# Patient Record
Sex: Male | Born: 1945 | Race: White | Hispanic: No | Smoking: Former smoker
Health system: Southern US, Community
[De-identification: ages and names within clinical notes are randomized; demographics above are authoritative.]

## PROBLEM LIST (undated history)

## (undated) DIAGNOSIS — R200 Anesthesia of skin: Secondary | ICD-10-CM

## (undated) DIAGNOSIS — I340 Nonrheumatic mitral (valve) insufficiency: Secondary | ICD-10-CM

## (undated) DIAGNOSIS — I2510 Atherosclerotic heart disease of native coronary artery without angina pectoris: Secondary | ICD-10-CM

## (undated) DIAGNOSIS — Z9289 Personal history of other medical treatment: Secondary | ICD-10-CM

## (undated) DIAGNOSIS — E119 Type 2 diabetes mellitus without complications: Secondary | ICD-10-CM

## (undated) DIAGNOSIS — Z973 Presence of spectacles and contact lenses: Secondary | ICD-10-CM

## (undated) DIAGNOSIS — R531 Weakness: Secondary | ICD-10-CM

## (undated) DIAGNOSIS — I5022 Chronic systolic (congestive) heart failure: Secondary | ICD-10-CM

## (undated) DIAGNOSIS — I739 Peripheral vascular disease, unspecified: Secondary | ICD-10-CM

## (undated) DIAGNOSIS — I639 Cerebral infarction, unspecified: Secondary | ICD-10-CM

## (undated) DIAGNOSIS — I255 Ischemic cardiomyopathy: Secondary | ICD-10-CM

## (undated) DIAGNOSIS — D649 Anemia, unspecified: Secondary | ICD-10-CM

## (undated) DIAGNOSIS — R0600 Dyspnea, unspecified: Secondary | ICD-10-CM

## (undated) DIAGNOSIS — E785 Hyperlipidemia, unspecified: Secondary | ICD-10-CM

## (undated) DIAGNOSIS — M1990 Unspecified osteoarthritis, unspecified site: Secondary | ICD-10-CM

## (undated) DIAGNOSIS — C642 Malignant neoplasm of left kidney, except renal pelvis: Secondary | ICD-10-CM

## (undated) DIAGNOSIS — G459 Transient cerebral ischemic attack, unspecified: Secondary | ICD-10-CM

## (undated) DIAGNOSIS — R911 Solitary pulmonary nodule: Secondary | ICD-10-CM

## (undated) DIAGNOSIS — R197 Diarrhea, unspecified: Secondary | ICD-10-CM

## (undated) DIAGNOSIS — I779 Disorder of arteries and arterioles, unspecified: Secondary | ICD-10-CM

## (undated) HISTORY — DX: Cerebral infarction, unspecified: I63.9

## (undated) HISTORY — PX: RENAL BIOPSY: SHX156

## (undated) HISTORY — DX: Hyperlipidemia, unspecified: E78.5

## (undated) HISTORY — DX: Atherosclerotic heart disease of native coronary artery without angina pectoris: I25.10

## (undated) HISTORY — PX: CARDIOVASCULAR STRESS TEST: SHX262

## (undated) HISTORY — DX: Type 2 diabetes mellitus without complications: E11.9

## (undated) HISTORY — DX: Ischemic cardiomyopathy: I25.5

---

## 2012-07-04 ENCOUNTER — Inpatient Hospital Stay (HOSPITAL_COMMUNITY)
Admission: EM | Admit: 2012-07-04 | Discharge: 2012-07-11 | DRG: 234 | Disposition: A | Discharge status: Home / Self Care | Payer: Medicare Other | Attending: Cardiothoracic Surgery | Admitting: Cardiothoracic Surgery

## 2012-07-04 ENCOUNTER — Inpatient Hospital Stay (HOSPITAL_COMMUNITY): Payer: Medicare Other

## 2012-07-04 ENCOUNTER — Encounter (HOSPITAL_COMMUNITY)
Admission: EM | Disposition: A | Discharge status: Home / Self Care | Payer: Self-pay | Source: Home / Self Care | Attending: Cardiothoracic Surgery

## 2012-07-04 ENCOUNTER — Other Ambulatory Visit: Payer: Self-pay | Admitting: *Deleted

## 2012-07-04 ENCOUNTER — Ambulatory Visit (HOSPITAL_COMMUNITY): Admit: 2012-07-04 | Payer: Self-pay | Admitting: Cardiovascular Disease

## 2012-07-04 ENCOUNTER — Encounter (HOSPITAL_COMMUNITY): Payer: Self-pay | Admitting: Physician Assistant

## 2012-07-04 DIAGNOSIS — I2510 Atherosclerotic heart disease of native coronary artery without angina pectoris: Secondary | ICD-10-CM

## 2012-07-04 DIAGNOSIS — E8779 Other fluid overload: Secondary | ICD-10-CM | POA: Diagnosis not present

## 2012-07-04 DIAGNOSIS — Z7982 Long term (current) use of aspirin: Secondary | ICD-10-CM

## 2012-07-04 DIAGNOSIS — E119 Type 2 diabetes mellitus without complications: Secondary | ICD-10-CM | POA: Diagnosis present

## 2012-07-04 DIAGNOSIS — I213 ST elevation (STEMI) myocardial infarction of unspecified site: Secondary | ICD-10-CM

## 2012-07-04 DIAGNOSIS — Y9210 Unspecified residential institution as the place of occurrence of the external cause: Secondary | ICD-10-CM | POA: Diagnosis present

## 2012-07-04 DIAGNOSIS — J9819 Other pulmonary collapse: Secondary | ICD-10-CM | POA: Diagnosis not present

## 2012-07-04 DIAGNOSIS — R739 Hyperglycemia, unspecified: Secondary | ICD-10-CM | POA: Diagnosis present

## 2012-07-04 DIAGNOSIS — E876 Hypokalemia: Secondary | ICD-10-CM | POA: Diagnosis not present

## 2012-07-04 DIAGNOSIS — I2109 ST elevation (STEMI) myocardial infarction involving other coronary artery of anterior wall: Principal | ICD-10-CM | POA: Diagnosis present

## 2012-07-04 DIAGNOSIS — D62 Acute posthemorrhagic anemia: Secondary | ICD-10-CM | POA: Diagnosis not present

## 2012-07-04 DIAGNOSIS — R7309 Other abnormal glucose: Secondary | ICD-10-CM

## 2012-07-04 DIAGNOSIS — Z8249 Family history of ischemic heart disease and other diseases of the circulatory system: Secondary | ICD-10-CM

## 2012-07-04 DIAGNOSIS — Z01810 Encounter for preprocedural cardiovascular examination: Secondary | ICD-10-CM

## 2012-07-04 DIAGNOSIS — I2584 Coronary atherosclerosis due to calcified coronary lesion: Secondary | ICD-10-CM | POA: Diagnosis present

## 2012-07-04 DIAGNOSIS — Z87891 Personal history of nicotine dependence: Secondary | ICD-10-CM

## 2012-07-04 DIAGNOSIS — I4891 Unspecified atrial fibrillation: Secondary | ICD-10-CM | POA: Diagnosis not present

## 2012-07-04 DIAGNOSIS — Z951 Presence of aortocoronary bypass graft: Secondary | ICD-10-CM

## 2012-07-04 DIAGNOSIS — J988 Other specified respiratory disorders: Secondary | ICD-10-CM | POA: Diagnosis not present

## 2012-07-04 DIAGNOSIS — I519 Heart disease, unspecified: Secondary | ICD-10-CM | POA: Diagnosis not present

## 2012-07-04 DIAGNOSIS — Z79899 Other long term (current) drug therapy: Secondary | ICD-10-CM

## 2012-07-04 DIAGNOSIS — Y832 Surgical operation with anastomosis, bypass or graft as the cause of abnormal reaction of the patient, or of later complication, without mention of misadventure at the time of the procedure: Secondary | ICD-10-CM | POA: Diagnosis present

## 2012-07-04 HISTORY — PX: LEFT HEART CATHETERIZATION WITH CORONARY ANGIOGRAM: SHX5451

## 2012-07-04 LAB — CBC
HCT: 48.5 % (ref 39.0–52.0)
HCT: 38.7 % — ABNORMAL LOW (ref 39.0–52.0)
Hemoglobin: 13.9 g/dL (ref 13.0–17.0)
MCH: 29.8 pg (ref 26.0–34.0)
MCHC: 36.5 g/dL — ABNORMAL HIGH (ref 30.0–36.0)
MCHC: 35.9 g/dL (ref 30.0–36.0)
MCV: 85.1 fL (ref 78.0–100.0)
RBC: 4.66 MIL/uL (ref 4.22–5.81)
RDW: 12.9 % (ref 11.5–15.5)

## 2012-07-04 LAB — GLUCOSE, CAPILLARY
Glucose-Capillary: 482 mg/dL — ABNORMAL HIGH (ref 70–99)
Glucose-Capillary: 319 mg/dL — ABNORMAL HIGH (ref 70–99)

## 2012-07-04 LAB — BASIC METABOLIC PANEL
BUN: 13 mg/dL (ref 6–23)
Creatinine, Ser: 0.77 mg/dL (ref 0.50–1.35)
GFR calc Af Amer: >90 mL/min (ref >90)
GFR calc non Af Amer: >90 mL/min (ref >90)
Potassium: 4.5 mEq/L (ref 3.5–5.1)

## 2012-07-04 LAB — BLOOD GAS, ARTERIAL
Acid-base deficit: 0.3 mmol/L (ref 0.0–2.0)
Bicarbonate: 23.7 mEq/L (ref 20.0–24.0)
Drawn by: 325261
O2 Content: 2 L/min
O2 Saturation: 97.6 %
Patient temperature: 98.6
TCO2: 24.8 mmol/L (ref 0–100)
pCO2 arterial: 37.7 mmHg (ref 35.0–45.0)
pH, Arterial: 7.415 (ref 7.350–7.450)
pO2, Arterial: 82.4 mmHg (ref 80.0–100.0)

## 2012-07-04 LAB — URINALYSIS, ROUTINE W REFLEX MICROSCOPIC
Bilirubin Urine: NEGATIVE
Glucose, UA: >1000 mg/dL — AB
Hgb urine dipstick: NEGATIVE
Ketones, ur: 15 mg/dL — AB
Leukocytes, UA: NEGATIVE
Nitrite: NEGATIVE
Protein, ur: NEGATIVE mg/dL
Specific Gravity, Urine: 1.01 (ref 1.005–1.030)
Urobilinogen, UA: 0.2 mg/dL (ref 0.0–1.0)
pH: 5.5 (ref 5.0–8.0)

## 2012-07-04 LAB — TSH: TSH: 0.734 u[IU]/mL (ref 0.350–4.500)

## 2012-07-04 LAB — CREATININE, SERUM
Creatinine, Ser: 0.71 mg/dL (ref 0.50–1.35)
GFR calc non Af Amer: >90 mL/min (ref >90)

## 2012-07-04 LAB — PREPARE RBC (CROSSMATCH)

## 2012-07-04 LAB — URINE MICROSCOPIC-ADD ON

## 2012-07-04 LAB — HEPARIN LEVEL (UNFRACTIONATED): Heparin Unfractionated: 0.12 IU/mL — ABNORMAL LOW (ref 0.30–0.70)

## 2012-07-04 LAB — ABO/RH: ABO/RH(D): O POS

## 2012-07-04 LAB — SURGICAL PCR SCREEN
MRSA, PCR: NEGATIVE
Staphylococcus aureus: POSITIVE — AB

## 2012-07-04 LAB — PROTIME-INR
INR: 1.25 (ref 0.00–1.49)
Prothrombin Time: 15.5 seconds — ABNORMAL HIGH (ref 11.6–15.2)

## 2012-07-04 LAB — LIPID PANEL: LDL Cholesterol: 102 mg/dL — ABNORMAL HIGH (ref 0–99)

## 2012-07-04 LAB — HEMOGLOBIN A1C
Hgb A1c MFr Bld: 14.3 % — ABNORMAL HIGH (ref <5.7)
Mean Plasma Glucose: 364 mg/dL — ABNORMAL HIGH (ref <117)

## 2012-07-04 SURGERY — LEFT HEART CATHETERIZATION WITH CORONARY ANGIOGRAM
Anesthesia: LOCAL

## 2012-07-04 MED ORDER — ATORVASTATIN CALCIUM 80 MG PO TABS
80.0000 mg | ORAL_TABLET | Freq: Every day | ORAL | Status: DC
  Administered 2012-07-04 – 2012-07-10 (×6): 80 mg via ORAL
  Filled 2012-07-04 (×8): qty 1

## 2012-07-04 MED ORDER — SODIUM CHLORIDE 0.9 % IV SOLN
INTRAVENOUS | Status: DC
  Filled 2012-07-04: qty 1

## 2012-07-04 MED ORDER — ALBUTEROL SULFATE (5 MG/ML) 0.5% IN NEBU
2.5000 mg | INHALATION_SOLUTION | Freq: Once | RESPIRATORY_TRACT | Status: AC
  Administered 2012-07-04: 2.5 mg via RESPIRATORY_TRACT

## 2012-07-04 MED ORDER — BISACODYL 5 MG PO TBEC
5.0000 mg | DELAYED_RELEASE_TABLET | Freq: Once | ORAL | Status: AC
  Administered 2012-07-04: 5 mg via ORAL
  Filled 2012-07-04: qty 1

## 2012-07-04 MED ORDER — INSULIN ASPART 100 UNIT/ML ~~LOC~~ SOLN: 0.0000 [IU] | Freq: Every day | SUBCUTANEOUS | Status: DC

## 2012-07-04 MED ORDER — HEPARIN (PORCINE) IN NACL 100-0.45 UNIT/ML-% IJ SOLN
1300.0000 [IU]/h | INTRAMUSCULAR | Status: DC
  Administered 2012-07-04: 1000 [IU]/h via INTRAVENOUS
  Filled 2012-07-04 (×2): qty 250

## 2012-07-04 MED ORDER — ONDANSETRON HCL 4 MG/2ML IJ SOLN: 4.0000 mg | Freq: Four times a day (QID) | INTRAMUSCULAR | Status: DC | PRN

## 2012-07-04 MED ORDER — POTASSIUM CHLORIDE 2 MEQ/ML IV SOLN
80.0000 meq | INTRAVENOUS | Status: DC
  Filled 2012-07-04: qty 40

## 2012-07-04 MED ORDER — SODIUM CHLORIDE 0.9 % IV SOLN: 250.0000 mL | INTRAVENOUS | Status: DC | PRN

## 2012-07-04 MED ORDER — DEXTROSE 5 % IV SOLN
1.5000 g | INTRAVENOUS | Status: DC
  Filled 2012-07-04: qty 1.5

## 2012-07-04 MED ORDER — CHLORHEXIDINE GLUCONATE 4 % EX LIQD
60.0000 mL | Freq: Once | CUTANEOUS | Status: AC
  Administered 2012-07-05: 4 via TOPICAL
  Filled 2012-07-04: qty 60

## 2012-07-04 MED ORDER — ASPIRIN EC 325 MG PO TBEC: 325.0000 mg | DELAYED_RELEASE_TABLET | Freq: Every day | ORAL | Status: DC

## 2012-07-04 MED ORDER — DEXTROSE 5 % IV SOLN
750.0000 mg | INTRAVENOUS | Status: DC
  Filled 2012-07-04: qty 750

## 2012-07-04 MED ORDER — MAGNESIUM SULFATE 50 % IJ SOLN
40.0000 meq | INTRAMUSCULAR | Status: DC
  Filled 2012-07-04: qty 10

## 2012-07-04 MED ORDER — INSULIN ASPART 100 UNIT/ML ~~LOC~~ SOLN: 0.0000 [IU] | Freq: Three times a day (TID) | SUBCUTANEOUS | Status: DC

## 2012-07-04 MED ORDER — EPINEPHRINE HCL 1 MG/ML IJ SOLN
0.5000 ug/min | INTRAMUSCULAR | Status: DC
  Filled 2012-07-04: qty 4

## 2012-07-04 MED ORDER — DIAZEPAM 5 MG PO TABS: 5.0000 mg | ORAL_TABLET | ORAL | Status: DC | PRN

## 2012-07-04 MED ORDER — PHENYLEPHRINE HCL 10 MG/ML IJ SOLN
30.0000 ug/min | INTRAVENOUS | Status: DC
  Filled 2012-07-04: qty 2

## 2012-07-04 MED ORDER — DEXMEDETOMIDINE HCL IN NACL 400 MCG/100ML IV SOLN
0.1000 ug/kg/h | INTRAVENOUS | Status: DC
  Filled 2012-07-04: qty 100

## 2012-07-04 MED ORDER — LIDOCAINE HCL (PF) 1 % IJ SOLN
INTRAMUSCULAR | Status: AC
  Filled 2012-07-04: qty 30

## 2012-07-04 MED ORDER — VANCOMYCIN HCL 10 G IV SOLR
1500.0000 mg | INTRAVENOUS | Status: AC
  Administered 2012-07-05: 1500 mg via INTRAVENOUS
  Filled 2012-07-04: qty 1500

## 2012-07-04 MED ORDER — NITROGLYCERIN IN D5W 200-5 MCG/ML-% IV SOLN
2.0000 ug/min | INTRAVENOUS | Status: DC
  Filled 2012-07-04: qty 250

## 2012-07-04 MED ORDER — INSULIN ASPART 100 UNIT/ML ~~LOC~~ SOLN
10.0000 [IU] | Freq: Once | SUBCUTANEOUS | Status: AC
  Administered 2012-07-04: 10 [IU] via SUBCUTANEOUS
  Filled 2012-07-04: qty 1

## 2012-07-04 MED ORDER — ACETAMINOPHEN 325 MG PO TABS: 650.0000 mg | ORAL_TABLET | Freq: Once | ORAL | Status: DC

## 2012-07-04 MED ORDER — VANCOMYCIN HCL 10 G IV SOLR
1500.0000 mg | INTRAVENOUS | Status: DC
  Filled 2012-07-04: qty 1500

## 2012-07-04 MED ORDER — HEPARIN SODIUM (PORCINE) 1000 UNIT/ML IJ SOLN
INTRAMUSCULAR | Status: AC
  Filled 2012-07-04: qty 1

## 2012-07-04 MED ORDER — SODIUM CHLORIDE 0.9 % IV SOLN: INTRAVENOUS | Status: DC

## 2012-07-04 MED ORDER — PHENYLEPHRINE HCL 10 MG/ML IJ SOLN
30.0000 ug/min | INTRAVENOUS | Status: AC
  Administered 2012-07-05: 20 ug/min via INTRAVENOUS
  Filled 2012-07-04: qty 2

## 2012-07-04 MED ORDER — SODIUM CHLORIDE 0.9 % IV SOLN
INTRAVENOUS | Status: AC
  Administered 2012-07-05: 69.8 mL/h via INTRAVENOUS
  Filled 2012-07-04: qty 40

## 2012-07-04 MED ORDER — SODIUM CHLORIDE 0.9 % IJ SOLN: 3.0000 mL | INTRAMUSCULAR | Status: DC | PRN

## 2012-07-04 MED ORDER — INSULIN ASPART 100 UNIT/ML ~~LOC~~ SOLN: 10.0000 [IU] | Freq: Once | SUBCUTANEOUS | Status: DC

## 2012-07-04 MED ORDER — ACETAMINOPHEN 325 MG PO TABS: 650.0000 mg | ORAL_TABLET | ORAL | Status: DC | PRN

## 2012-07-04 MED ORDER — DEXTROSE 5 % IV SOLN
1.5000 g | INTRAVENOUS | Status: AC
  Administered 2012-07-05: 1.5 g via INTRAVENOUS
  Administered 2012-07-05: .75 g via INTRAVENOUS
  Filled 2012-07-04: qty 1.5

## 2012-07-04 MED ORDER — METOPROLOL TARTRATE 1 MG/ML IV SOLN
INTRAVENOUS | Status: AC
  Filled 2012-07-04: qty 5

## 2012-07-04 MED ORDER — ALPRAZOLAM 0.25 MG PO TABS: 0.2500 mg | ORAL_TABLET | Freq: Two times a day (BID) | ORAL | Status: DC | PRN

## 2012-07-04 MED ORDER — HEPARIN (PORCINE) IN NACL 2-0.9 UNIT/ML-% IJ SOLN
INTRAMUSCULAR | Status: AC
  Filled 2012-07-04: qty 1000

## 2012-07-04 MED ORDER — ATORVASTATIN CALCIUM 80 MG PO TABS: 80.0000 mg | ORAL_TABLET | Freq: Every day | ORAL | Status: DC

## 2012-07-04 MED ORDER — SODIUM CHLORIDE 0.9 % IV SOLN
INTRAVENOUS | Status: DC
  Administered 2012-07-04: 2.7 [IU]/h via INTRAVENOUS
  Filled 2012-07-04 (×2): qty 1

## 2012-07-04 MED ORDER — BUDESONIDE-FORMOTEROL FUMARATE 160-4.5 MCG/ACT IN AERO
2.0000 | INHALATION_SPRAY | Freq: Two times a day (BID) | RESPIRATORY_TRACT | Status: DC
  Administered 2012-07-04: 2 via RESPIRATORY_TRACT
  Filled 2012-07-04: qty 6

## 2012-07-04 MED ORDER — ZOLPIDEM TARTRATE 5 MG PO TABS: 5.0000 mg | ORAL_TABLET | Freq: Every evening | ORAL | Status: DC | PRN

## 2012-07-04 MED ORDER — METOPROLOL TARTRATE 12.5 MG HALF TABLET
25.0000 mg | ORAL_TABLET | Freq: Two times a day (BID) | ORAL | Status: DC
  Administered 2012-07-04: 25 mg via ORAL

## 2012-07-04 MED ORDER — DOPAMINE-DEXTROSE 3.2-5 MG/ML-% IV SOLN
2.0000 ug/kg/min | INTRAVENOUS | Status: DC
  Filled 2012-07-04: qty 250

## 2012-07-04 MED ORDER — DOPAMINE-DEXTROSE 3.2-5 MG/ML-% IV SOLN
2.0000 ug/kg/min | INTRAVENOUS | Status: AC
  Administered 2012-07-05: 3 ug/kg/min via INTRAVENOUS

## 2012-07-04 MED ORDER — NITROGLYCERIN 0.4 MG SL SUBL: 0.4000 mg | SUBLINGUAL_TABLET | SUBLINGUAL | Status: DC | PRN

## 2012-07-04 MED ORDER — CEFUROXIME SODIUM 750 MG IJ SOLR
750.0000 mg | INTRAMUSCULAR | Status: DC
  Filled 2012-07-04: qty 750

## 2012-07-04 MED ORDER — DEXMEDETOMIDINE HCL IN NACL 400 MCG/100ML IV SOLN
0.1000 ug/kg/h | INTRAVENOUS | Status: AC
  Administered 2012-07-05: 0.2 ug/kg/h via INTRAVENOUS

## 2012-07-04 MED ORDER — SODIUM CHLORIDE 0.9 % IV SOLN
INTRAVENOUS | Status: DC
  Filled 2012-07-04: qty 40

## 2012-07-04 MED ORDER — CHLORHEXIDINE GLUCONATE 4 % EX LIQD
60.0000 mL | Freq: Once | CUTANEOUS | Status: AC
  Administered 2012-07-04: 4 via TOPICAL
  Filled 2012-07-04: qty 60

## 2012-07-04 MED ORDER — PLASMA-LYTE 148 IV SOLN
INTRAVENOUS | Status: AC
  Administered 2012-07-05: 10:00:00
  Filled 2012-07-04: qty 2.5

## 2012-07-04 MED ORDER — METOPROLOL TARTRATE 12.5 MG HALF TABLET
12.5000 mg | ORAL_TABLET | Freq: Once | ORAL | Status: AC
  Administered 2012-07-05: 12.5 mg via ORAL
  Filled 2012-07-04: qty 1

## 2012-07-04 MED ORDER — NITROGLYCERIN IN D5W 200-5 MCG/ML-% IV SOLN
2.0000 ug/min | INTRAVENOUS | Status: AC
  Administered 2012-07-05: 5 ug/min via INTRAVENOUS

## 2012-07-04 MED ORDER — ASPIRIN 81 MG PO CHEW
CHEWABLE_TABLET | ORAL | Status: AC
  Administered 2012-07-04: 324 mg
  Filled 2012-07-04: qty 4

## 2012-07-04 MED ORDER — CARVEDILOL 6.25 MG PO TABS: 6.2500 mg | ORAL_TABLET | Freq: Two times a day (BID) | ORAL | Status: DC

## 2012-07-04 MED ORDER — EPINEPHRINE HCL 1 MG/ML IJ SOLN
0.5000 ug/min | INTRAVENOUS | Status: DC
  Filled 2012-07-04: qty 4

## 2012-07-04 MED ORDER — SODIUM CHLORIDE 0.9 % IJ SOLN: 3.0000 mL | Freq: Two times a day (BID) | INTRAMUSCULAR | Status: DC

## 2012-07-04 MED ORDER — PLASMA-LYTE 148 IV SOLN
INTRAVENOUS | Status: DC
  Filled 2012-07-04: qty 2.5

## 2012-07-04 NOTE — Progress Notes (Signed)
ANTICOAGULATION CONSULT NOTE - Follow Up Consult  Pharmacy Consult for Heparin Indication: 3VCAD  No Known Allergies  Patient Measurements: Height: 6' (182.9 cm) Weight: 195 lb 1.7 oz (88.5 kg) IBW/kg (Calculated) : 77.6 Heparin Dosing Weight:    Vital Signs: Temp: 97.9 F (36.6 C) (04/17 1900) Temp src: Oral (04/17 1900) BP: 110/67 mmHg (04/17 2000) Pulse Rate: 78 (04/17 2100)  Labs:  Recent Labs  07/04/12 1215 07/04/12 1310 07/04/12 1607 07/04/12 2058  HGB 17.7*  --  13.9  --   HCT 48.5  --  38.7*  --   PLT 248  --  231  --   APTT  --  143*  --   --   LABPROT  --  15.5*  --   --   INR  --  1.25  --   --   HEPARINUNFRC  --   --   --  0.12*  CREATININE 0.77  --  0.71  --   TROPONINI  --   --  10.28*  --     Estimated Creatinine Clearance: 101 ml/min (by C-G formula based on Cr of 0.71).   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 Plan CABG in am. Started on IV heparin post-cath with heparin level < goal at 0.12.  Goal of Therapy:  Heparin level 0.3-0.7 units/ml Monitor platelets by anticoagulation protocol: Yes   Plan:  Increase IV heparin (no bolus due to fresh post-cath) to 1300 units/hr. Will check heparin level again in 6-8 hrs with am labs and f/u after CABG.  Misty Stanley Stillinger 07/04/2012,9:35 PM

## 2012-07-04 NOTE — ED Notes (Signed)
Report given to cath lab.   pts brother had heart attack in last year. Niece is type I diabetic. Smoker 30 years 1 pack per day. Quit smoking 6 months ago.

## 2012-07-04 NOTE — Progress Notes (Signed)
ANTICOAGULATION CONSULT NOTE - Initial Consult  Pharmacy Consult for heparin Indication: chest pain/ACS  No Known Allergies  Patient Measurements: Height: 6' (182.9 cm) Weight: 195 lb 1.7 oz (88.5 kg) IBW/kg (Calculated) : 77.6 Heparin Dosing Weight: 85kg  Vital Signs: Temp: 98.5 F (36.9 C) (04/17 1203) Temp src: Oral (04/17 1203) BP: 117/89 mmHg (04/17 1224) Pulse Rate: 111 (04/17 1224)  Labs:  Recent Labs  07/04/12 1215  HGB 17.7*  HCT 48.5  PLT 248  CREATININE 0.77    Estimated Creatinine Clearance: 101 ml/min (by C-G formula based on Cr of 0.77).   Medical History: None  Medications:  No meds prior to admission  Assessment: 67 year old male with slight syncopal episode earlier this morning then drove himself to [facility]. He has been having CP for quite some time and troponin was elevated at 6, CBG elevated >500, and he was cp free at presentation to cath lab.   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 He was given 324mg  of aspirin and 10 units of insulin at [facility] and 4k units of heparin in cath lab.  CVTS consult recommends glucose control overnight and CABG in am. Orders to start IV heparin at 1530.  Goal of Therapy:  Heparin level 0.3-0.7 units/ml Monitor platelets by anticoagulation protocol: Yes   Plan:  Start heparin infusion at 1000 units/hr Check anti-Xa level in 6 hours and daily while on heparin Continue to monitor H&H and platelets  Sheppard Coil PharmD., BCPS Clinical Pharmacist Pager [phone] 07/04/2012 1:46 PM

## 2012-07-04 NOTE — ED Notes (Signed)
Pt being transported by [facility]

## 2012-07-04 NOTE — Progress Notes (Signed)
BA PFT completed at bedside. Pt bed @ 50 degrees. Unconfirmed results to be placed in Shadow Chart

## 2012-07-04 NOTE — ED Provider Notes (Signed)
History     CSN: [id number]  Arrival date & time 07/04/12  1154   First MD Initiated Contact with Patient 07/04/12 1205      Chief Complaint  Patient presents with  . Near Syncope    (Consider location/radiation/quality/duration/timing/severity/associated sxs/prior treatment) The history is provided by the patient and the spouse.  pt here with left arm numbness and chest pain x 2 days--pt notes associated dyspnea and diaphoresis--pt worse with exertion--also notes near syncope--sx better with rest--no tx used, no priror h/o same--denies any back pain  No past medical history on file.  No past surgical history on file.  No family history on file.  History  Substance Use Topics  . Smoking status: Not on file  . Smokeless tobacco: Not on file  . Alcohol Use: Not on file      Review of Systems  All other systems reviewed and are negative.    Allergies  Review of patient's allergies indicates not on file.  Home Medications  No current outpatient prescriptions on file.  BP 137/102  Pulse 120  Temp(Src) 98.5 F (36.9 C) (Oral)  Resp 18  SpO2 99%  Physical Exam  Nursing note and vitals reviewed. Constitutional: He is oriented to person, place, and time. He appears well-developed and well-nourished.  Non-toxic appearance. No distress.  HENT:  Head: Normocephalic and atraumatic.  Eyes: Conjunctivae, EOM and lids are normal. Pupils are equal, round, and reactive to light.  Neck: Normal range of motion. Neck supple. No tracheal deviation present. No mass present.  Cardiovascular: Regular rhythm and normal heart sounds.  Tachycardia present.  Exam reveals no gallop.   No murmur heard. Pulmonary/Chest: Effort normal and breath sounds normal. No stridor. No respiratory distress. He has no decreased breath sounds. He has no wheezes. He has no rhonchi. He has no rales.  Abdominal: Soft. Normal appearance and bowel sounds are normal. He exhibits no distension. There is no  tenderness. There is no rebound and no CVA tenderness.  Musculoskeletal: Normal range of motion. He exhibits no edema and no tenderness.  Neurological: He is alert and oriented to person, place, and time. He has normal strength. No cranial nerve deficit or sensory deficit. GCS eye subscore is 4. GCS verbal subscore is 5. GCS motor subscore is 6.  Skin: Skin is warm and dry. No abrasion and no rash noted.  Psychiatric: He has a normal mood and affect. His speech is normal and behavior is normal.    ED Course  Procedures (including critical care time)  Labs Reviewed  GLUCOSE, CAPILLARY - Abnormal; Notable for the following:    Glucose-Capillary 544 (*)    All other components within normal limits  CBC  BASIC METABOLIC PANEL  PRO B NATRIURETIC PEPTIDE   No results found.   No diagnosis found.    MDM   Date: 07/04/2012  Rate: 120   Rhythm: sinus tachycardia  QRS Axis: normal  Intervals: normal  ST/T Wave abnormalities: ST elevations anteriorly  Conduction Disutrbances:none  Narrative Interpretation: acute ant wall MI  Old EKG Reviewed:none  CODE stemi activated, pt given aspirin, hyperglycemia noted, will given insulin, pt to be transferred to [facility] to cath lab          Toy Baker, MD 07/04/12 1223

## 2012-07-04 NOTE — ED Notes (Signed)
Critical lab - I stat Troponin level 6.07.  [place] reported lab to Dr Freida Busman [phone].
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
## 2012-07-04 NOTE — ED Notes (Signed)
md at bedside
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
## 2012-07-04 NOTE — Consult Note (Signed)
patient examined and medical record reviewed,agree with above note. Trevor [name],Trevor [name] 07/04/2012    Pt examined and cath reviewed and discussed with Dr Kirke Corin- plan multi vessel CABG in AM.

## 2012-07-04 NOTE — Progress Notes (Signed)
VASCULAR LAB PRELIMINARY  PRELIMINARY  PRELIMINARY  PRELIMINARY  Pre-op Cardiac Surgery  Carotid Findings:  Bilateral:  No evidence of hemodynamically significant internal carotid artery stenosis.   Vertebral artery flow is antegrade.      Upper Extremity Right Left  Brachial Pressures 116 T 109 T  Radial Waveforms T T  Ulnar Waveforms T T  Palmar Arch (Allen's Test) * **   Findings:  *Right:  Doppler waveforms obliterate with radial and remain normal with ulnar compressions.  **Left:  Doppler waveforms remain normal with radial and ulnar compressions.    Lower  Extremity Right Left  Dorsalis Pedis    Anterior Tibial 95 M 137 T  Posterior Tibial 91 M 102 T  Ankle/Brachial Indices 0.82 >1.0    Findings:  Right ABI indicates a mild reduction in arterial flow.  Left ABI is within normal limits.   Piers Baade, RVT 07/04/2012, 4:48 PM

## 2012-07-04 NOTE — ED Notes (Signed)
[facility] at bedside 

## 2012-07-04 NOTE — Progress Notes (Signed)
CRITICAL VALUE ALERT  Critical value received:  Troponin 10.28  Date of notification:  07/04/12  Time of notification:  1705  Critical value read back:yes  Nurse who received alert:  Larina Bras  MD notified (1st page):  Will address with rounds  Time of first page:  n/a  MD notified (2nd page):  Time of second page:  Responding MD:  n/a  Time MD responded:  n/a
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
## 2012-07-04 NOTE — CV Procedure (Signed)
    Cardiac Catheterization Procedure Note  Name: Trevor [name] MRN: [id number] DOB: 1945/08/11  Procedure: Left Heart Cath, Selective Coronary Angiography, LV angiography, Mynx closure device   Indication: Anterior ST elevation myocardial infarction with late presentation.   Medications:  Sedation:   Contrast:  90 mL Omnipaque  Procedural details: The right groin was prepped, draped, and anesthetized with 1% lidocaine. Using modified Seldinger technique, a 6  French sheath was introduced into the right femoral artery. Standard Judkins catheters were used for coronary angiography and left ventriculography. An XB 3.5 guiding catheter was used to engage the left main coronary artery. Catheter exchanges were performed over a guidewire. There were no immediate procedural complications. The patient was transferred to the post catheterization recovery area for further monitoring.   Procedural Findings:  Hemodynamics: AO:  110/66   mmHg LV:  111/1    mmHg LVEDP: 8  mmHg  Coronary angiography: Coronary dominance: Right   Left Main:  Normal in size and mildly calcified. There is a 50% distal stenosis.  Left Anterior Descending (LAD):  Normal in size and mildly calcified throughout its course. There is diffuse 20% disease proximally. In the midsegment, there is a 99% tubular stenosis which is likely the culprit for anterior ST elevation myocardial infarction. There is TIMI 2 flow in the vessel.  Circumflex (LCx):  Normal in size and nondominant. There is an 80-90% ostial stenosis. In the midsegment, there is an 80% stenosis.  1st obtuse marginal:  Normal in size with 82-90% ostial stenosis.  2nd obtuse marginal:  Small in size with minor irregularities.  3rd obtuse marginal:  Small in size with minor irregularities.   Ramus Intermedius:  Normal in size with 50% ostial disease and 70% mid stenosis.  Right Coronary Artery: Very large in size and dominant. The vessel is moderately calcified  throughout its course. There is a 90% proximal stenosis. The mid vessel has diffuse 20% disease. There is a 40% distal stenosis.  posterior descending artery: Normal in size with diffuse 90% disease proximally.  posterior lateral branch:  PL 1 is small in size with no significant disease. PL 2 is large in size with 90% proximal disease.  Left ventriculography: Left ventricular systolic function is moderately reduced , LVEF is estimated at 35-40 %, there is no significant mitral regurgitation . There is anterior and apical wall hypokinesis.  Final Conclusions:   1. Severe three-vessel coronary artery disease and borderline significant left main stenosis. The LAD is likely the culprit for ST elevation MI. However, the vessel has reasonable flow. 2. Moderately reduced LV systolic function with normal LVEDP.  Recommendations:  Given the patient's diffuse three-vessel coronary artery disease, LV dysfunction and the fact that he is likely diabetic, I think the best option is CABG. I consulted Dr. Donata Clay who agreed. The plan is to stabilize the patient overnight and bring his blood sugar down with insulin drip. He was given 2.5 mg of IV metoprolol with significant improvement in his heart rate and blood pressure. His vital signs were stable and he was chest pain-free.  Lorine Bears MD, [facility] 07/04/2012, 1:38 PM

## 2012-07-05 ENCOUNTER — Inpatient Hospital Stay (HOSPITAL_COMMUNITY): Payer: Medicare Other | Admitting: Anesthesiology

## 2012-07-05 ENCOUNTER — Encounter (HOSPITAL_COMMUNITY)
Admission: EM | Disposition: A | Discharge status: Home / Self Care | Payer: Self-pay | Source: Home / Self Care | Attending: Cardiothoracic Surgery

## 2012-07-05 ENCOUNTER — Inpatient Hospital Stay (HOSPITAL_COMMUNITY): Payer: Medicare Other

## 2012-07-05 ENCOUNTER — Encounter (HOSPITAL_COMMUNITY): Payer: Self-pay | Admitting: Anesthesiology

## 2012-07-05 DIAGNOSIS — I2510 Atherosclerotic heart disease of native coronary artery without angina pectoris: Secondary | ICD-10-CM

## 2012-07-05 DIAGNOSIS — I219 Acute myocardial infarction, unspecified: Secondary | ICD-10-CM

## 2012-07-05 HISTORY — PX: INTRAOPERATIVE TRANSESOPHAGEAL ECHOCARDIOGRAM: SHX5062

## 2012-07-05 HISTORY — PX: CORONARY ARTERY BYPASS GRAFT: SHX141

## 2012-07-05 LAB — CBC
HCT: 36.9 % — ABNORMAL LOW (ref 39.0–52.0)
HCT: 33 % — ABNORMAL LOW (ref 39.0–52.0)
Hemoglobin: 13.4 g/dL (ref 13.0–17.0)
Hemoglobin: 11.8 g/dL — ABNORMAL LOW (ref 13.0–17.0)
MCH: 30.7 pg (ref 26.0–34.0)
MCH: 30.4 pg (ref 26.0–34.0)
MCH: 30.3 pg (ref 26.0–34.0)
MCHC: 36.3 g/dL — ABNORMAL HIGH (ref 30.0–36.0)
MCHC: 35.8 g/dL (ref 30.0–36.0)
MCV: 84.6 fL (ref 78.0–100.0)
MCV: 84.2 fL (ref 78.0–100.0)
MCV: 84.6 fL (ref 78.0–100.0)
Platelets: 207 10*3/uL (ref 150–400)
Platelets: 158 10*3/uL (ref 150–400)
Platelets: 166 10*3/uL (ref 150–400)
RBC: 4.36 MIL/uL (ref 4.22–5.81)
RBC: 3.9 MIL/uL — ABNORMAL LOW (ref 4.22–5.81)
RDW: 13.1 % (ref 11.5–15.5)
RDW: 13 % (ref 11.5–15.5)
RDW: 13.1 % (ref 11.5–15.5)
WBC: 12.8 10*3/uL — ABNORMAL HIGH (ref 4.0–10.5)
WBC: 16.8 10*3/uL — ABNORMAL HIGH (ref 4.0–10.5)

## 2012-07-05 LAB — GLUCOSE, CAPILLARY
Glucose-Capillary: 304 mg/dL — ABNORMAL HIGH (ref 70–99)
Glucose-Capillary: 185 mg/dL — ABNORMAL HIGH (ref 70–99)
Glucose-Capillary: 119 mg/dL — ABNORMAL HIGH (ref 70–99)
Glucose-Capillary: 136 mg/dL — ABNORMAL HIGH (ref 70–99)
Glucose-Capillary: 132 mg/dL — ABNORMAL HIGH (ref 70–99)
Glucose-Capillary: 99 mg/dL (ref 70–99)
Glucose-Capillary: 105 mg/dL — ABNORMAL HIGH (ref 70–99)
Glucose-Capillary: 107 mg/dL — ABNORMAL HIGH (ref 70–99)
Glucose-Capillary: 126 mg/dL — ABNORMAL HIGH (ref 70–99)
Glucose-Capillary: 136 mg/dL — ABNORMAL HIGH (ref 70–99)

## 2012-07-05 LAB — POCT I-STAT 4, (NA,K, GLUC, HGB,HCT)
Glucose, Bld: 197 mg/dL — ABNORMAL HIGH (ref 70–99)
Glucose, Bld: 148 mg/dL — ABNORMAL HIGH (ref 70–99)
HCT: 26 % — ABNORMAL LOW (ref 39.0–52.0)
HCT: 33 % — ABNORMAL LOW (ref 39.0–52.0)
HCT: 28 % — ABNORMAL LOW (ref 39.0–52.0)
Hemoglobin: 11.6 g/dL — ABNORMAL LOW (ref 13.0–17.0)
Hemoglobin: 11.2 g/dL — ABNORMAL LOW (ref 13.0–17.0)
Hemoglobin: 9.5 g/dL — ABNORMAL LOW (ref 13.0–17.0)
Potassium: 3.8 mEq/L (ref 3.5–5.1)
Potassium: 4.6 mEq/L (ref 3.5–5.1)
Potassium: 3.6 mEq/L (ref 3.5–5.1)
Sodium: 141 mEq/L (ref 135–145)
Sodium: 140 mEq/L (ref 135–145)
Sodium: 138 mEq/L (ref 135–145)
Sodium: 139 mEq/L (ref 135–145)
Sodium: 142 mEq/L (ref 135–145)

## 2012-07-05 LAB — POCT I-STAT 3, ART BLOOD GAS (G3+)
Acid-base deficit: 2 mmol/L (ref 0.0–2.0)
Acid-base deficit: 2 mmol/L (ref 0.0–2.0)
Bicarbonate: 22.6 mEq/L (ref 20.0–24.0)
Bicarbonate: 21.2 mEq/L (ref 20.0–24.0)
O2 Saturation: 100 %
O2 Saturation: 98 %
O2 Saturation: 97 %
O2 Saturation: 94 %
Patient temperature: 36.2
Patient temperature: 37.8
Patient temperature: 38.3
TCO2: 25 mmol/L (ref 0–100)
TCO2: 24 mmol/L (ref 0–100)
TCO2: 24 mmol/L (ref 0–100)
TCO2: 22 mmol/L (ref 0–100)
pCO2 arterial: 44.7 mmHg (ref 35.0–45.0)
pCO2 arterial: 44.8 mmHg (ref 35.0–45.0)
pCO2 arterial: 37.9 mmHg (ref 35.0–45.0)
pH, Arterial: 7.345 — ABNORMAL LOW (ref 7.350–7.450)
pH, Arterial: 7.417 (ref 7.350–7.450)
pH, Arterial: 7.361 (ref 7.350–7.450)
pO2, Arterial: 320 mmHg — ABNORMAL HIGH (ref 80.0–100.0)
pO2, Arterial: 106 mmHg — ABNORMAL HIGH (ref 80.0–100.0)

## 2012-07-05 LAB — POCT I-STAT, CHEM 8
BUN: 9 mg/dL (ref 6–23)
Creatinine, Ser: 0.6 mg/dL (ref 0.50–1.35)
Glucose, Bld: 111 mg/dL — ABNORMAL HIGH (ref 70–99)
Hemoglobin: 11.2 g/dL — ABNORMAL LOW (ref 13.0–17.0)
Potassium: 4.6 mEq/L (ref 3.5–5.1)
Sodium: 142 mEq/L (ref 135–145)

## 2012-07-05 LAB — COMPREHENSIVE METABOLIC PANEL
ALT: 27 U/L (ref 0–53)
Alkaline Phosphatase: 93 U/L (ref 39–117)
CO2: 28 mEq/L (ref 19–32)
GFR calc Af Amer: >90 mL/min (ref >90)
GFR calc non Af Amer: >90 mL/min (ref >90)
Glucose, Bld: 143 mg/dL — ABNORMAL HIGH (ref 70–99)
Potassium: 3.7 mEq/L (ref 3.5–5.1)
Sodium: 140 mEq/L (ref 135–145)

## 2012-07-05 LAB — HEMOGLOBIN AND HEMATOCRIT, BLOOD
HCT: 29.1 % — ABNORMAL LOW (ref 39.0–52.0)
Hemoglobin: 10.4 g/dL — ABNORMAL LOW (ref 13.0–17.0)

## 2012-07-05 LAB — POCT I-STAT GLUCOSE: Glucose, Bld: 187 mg/dL — ABNORMAL HIGH (ref 70–99)

## 2012-07-05 LAB — CREATININE, SERUM
Creatinine, Ser: 0.63 mg/dL (ref 0.50–1.35)
GFR calc Af Amer: >90 mL/min (ref >90)
GFR calc non Af Amer: >90 mL/min (ref >90)

## 2012-07-05 LAB — MAGNESIUM: Magnesium: 2.7 mg/dL — ABNORMAL HIGH (ref 1.5–2.5)

## 2012-07-05 LAB — LIPID PANEL
Cholesterol: 143 mg/dL (ref 0–200)
LDL Cholesterol: 91 mg/dL (ref 0–99)
VLDL: 20 mg/dL (ref 0–40)

## 2012-07-05 LAB — PLATELET COUNT: Platelets: 159 10*3/uL (ref 150–400)

## 2012-07-05 SURGERY — CORONARY ARTERY BYPASS GRAFTING (CABG)
Anesthesia: General | Site: Chest | Wound class: Clean

## 2012-07-05 MED ORDER — 0.9 % SODIUM CHLORIDE (POUR BTL) OPTIME
TOPICAL | Status: DC | PRN
  Administered 2012-07-05: 6000 mL

## 2012-07-05 MED ORDER — LACTATED RINGERS IV SOLN: 500.0000 mL | Freq: Once | INTRAVENOUS | Status: AC | PRN

## 2012-07-05 MED ORDER — METOPROLOL TARTRATE 12.5 MG HALF TABLET
12.5000 mg | ORAL_TABLET | Freq: Two times a day (BID) | ORAL | Status: DC
  Administered 2012-07-06: 12.5 mg via ORAL
  Filled 2012-07-05 (×5): qty 1

## 2012-07-05 MED ORDER — POTASSIUM CHLORIDE 10 MEQ/50ML IV SOLN
10.0000 meq | INTRAVENOUS | Status: AC
  Administered 2012-07-05 (×2): 10 meq via INTRAVENOUS

## 2012-07-05 MED ORDER — INSULIN REGULAR BOLUS VIA INFUSION
0.0000 [IU] | Freq: Three times a day (TID) | INTRAVENOUS | Status: DC
  Administered 2012-07-06: 3 [IU] via INTRAVENOUS
  Filled 2012-07-05: qty 10

## 2012-07-05 MED ORDER — HEMOSTATIC AGENTS (NO CHARGE) OPTIME
TOPICAL | Status: DC | PRN
  Administered 2012-07-05: 1 via TOPICAL

## 2012-07-05 MED ORDER — SODIUM CHLORIDE 0.9 % IJ SOLN
OROMUCOSAL | Status: DC | PRN
  Administered 2012-07-05 (×3): via TOPICAL

## 2012-07-05 MED ORDER — SODIUM CHLORIDE 0.9 % IV SOLN: 250.0000 mL | INTRAVENOUS | Status: DC

## 2012-07-05 MED ORDER — SODIUM CHLORIDE 0.9 % IJ SOLN
3.0000 mL | Freq: Two times a day (BID) | INTRAMUSCULAR | Status: DC
  Administered 2012-07-06 – 2012-07-10 (×7): 3 mL via INTRAVENOUS

## 2012-07-05 MED ORDER — FENTANYL CITRATE 0.05 MG/ML IJ SOLN
INTRAMUSCULAR | Status: DC | PRN
  Administered 2012-07-05: 50 ug via INTRAVENOUS
  Administered 2012-07-05 (×3): 250 ug via INTRAVENOUS
  Administered 2012-07-05 (×2): 100 ug via INTRAVENOUS
  Administered 2012-07-05: 500 ug via INTRAVENOUS

## 2012-07-05 MED ORDER — CHLORHEXIDINE GLUCONATE CLOTH 2 % EX PADS
6.0000 | MEDICATED_PAD | Freq: Every day | CUTANEOUS | Status: AC
  Administered 2012-07-05 – 2012-07-09 (×5): 6 via TOPICAL

## 2012-07-05 MED ORDER — MUPIROCIN 2 % EX OINT
1.0000 "application " | TOPICAL_OINTMENT | Freq: Two times a day (BID) | CUTANEOUS | Status: AC
  Administered 2012-07-05 – 2012-07-10 (×10): 1 via NASAL
  Filled 2012-07-05: qty 22

## 2012-07-05 MED ORDER — FAMOTIDINE IN NACL 20-0.9 MG/50ML-% IV SOLN
20.0000 mg | Freq: Two times a day (BID) | INTRAVENOUS | Status: AC
  Administered 2012-07-05: 20 mg via INTRAVENOUS

## 2012-07-05 MED ORDER — MORPHINE SULFATE 2 MG/ML IJ SOLN: 2.0000 mg | INTRAMUSCULAR | Status: DC | PRN

## 2012-07-05 MED ORDER — SODIUM CHLORIDE 0.45 % IV SOLN
INTRAVENOUS | Status: DC
  Administered 2012-07-05: 20 mL/h via INTRAVENOUS

## 2012-07-05 MED ORDER — SODIUM CHLORIDE 0.9 % IJ SOLN
3.0000 mL | INTRAMUSCULAR | Status: DC | PRN
  Administered 2012-07-08: 3 mL via INTRAVENOUS

## 2012-07-05 MED ORDER — ALBUMIN HUMAN 5 % IV SOLN
INTRAVENOUS | Status: DC | PRN
  Administered 2012-07-05 (×2): via INTRAVENOUS

## 2012-07-05 MED ORDER — INSULIN REGULAR HUMAN 100 UNIT/ML IJ SOLN
INTRAMUSCULAR | Status: DC
  Administered 2012-07-05: 6.2 [IU]/h via INTRAVENOUS
  Filled 2012-07-05 (×2): qty 1

## 2012-07-05 MED ORDER — LACTATED RINGERS IV SOLN
INTRAVENOUS | Status: DC
  Administered 2012-07-05: 20 mL/h via INTRAVENOUS

## 2012-07-05 MED ORDER — PROTAMINE SULFATE 10 MG/ML IV SOLN
INTRAVENOUS | Status: DC | PRN
  Administered 2012-07-05: 300 mg via INTRAVENOUS

## 2012-07-05 MED ORDER — METOPROLOL TARTRATE 25 MG/10 ML ORAL SUSPENSION
12.5000 mg | Freq: Two times a day (BID) | ORAL | Status: DC
  Filled 2012-07-05 (×3): qty 5

## 2012-07-05 MED ORDER — VECURONIUM BROMIDE 10 MG IV SOLR
INTRAVENOUS | Status: DC | PRN
  Administered 2012-07-05: 10 mg via INTRAVENOUS
  Administered 2012-07-05 (×4): 5 mg via INTRAVENOUS

## 2012-07-05 MED ORDER — METOPROLOL TARTRATE 25 MG/10 ML ORAL SUSPENSION
12.5000 mg | Freq: Two times a day (BID) | ORAL | Status: DC
  Filled 2012-07-05: qty 5

## 2012-07-05 MED ORDER — ROCURONIUM BROMIDE 100 MG/10ML IV SOLN
INTRAVENOUS | Status: DC | PRN
  Administered 2012-07-05: 50 mg via INTRAVENOUS

## 2012-07-05 MED ORDER — LACTATED RINGERS IV SOLN
INTRAVENOUS | Status: DC | PRN
  Administered 2012-07-05 (×2): via INTRAVENOUS

## 2012-07-05 MED ORDER — ASPIRIN EC 325 MG PO TBEC
325.0000 mg | DELAYED_RELEASE_TABLET | Freq: Every day | ORAL | Status: DC
  Administered 2012-07-06 – 2012-07-11 (×6): 325 mg via ORAL
  Filled 2012-07-05 (×6): qty 1

## 2012-07-05 MED ORDER — DEXTROSE 5 % IV SOLN
1.5000 g | Freq: Two times a day (BID) | INTRAVENOUS | Status: AC
  Administered 2012-07-05 – 2012-07-06 (×3): 1.5 g via INTRAVENOUS
  Filled 2012-07-05 (×3): qty 1.5

## 2012-07-05 MED ORDER — DEXMEDETOMIDINE HCL IN NACL 200 MCG/50ML IV SOLN
0.1000 ug/kg/h | INTRAVENOUS | Status: DC
  Administered 2012-07-05: 0.7 ug/kg/h via INTRAVENOUS
  Filled 2012-07-05: qty 100

## 2012-07-05 MED ORDER — ACETAMINOPHEN 10 MG/ML IV SOLN
1000.0000 mg | Freq: Once | INTRAVENOUS | Status: AC
  Administered 2012-07-05: 1000 mg via INTRAVENOUS
  Filled 2012-07-05: qty 100

## 2012-07-05 MED ORDER — BISACODYL 5 MG PO TBEC
10.0000 mg | DELAYED_RELEASE_TABLET | Freq: Every day | ORAL | Status: DC
  Administered 2012-07-06 – 2012-07-10 (×3): 10 mg via ORAL
  Filled 2012-07-05 (×4): qty 2

## 2012-07-05 MED ORDER — ACETAMINOPHEN 160 MG/5ML PO SOLN: 975.0000 mg | Freq: Four times a day (QID) | ORAL | Status: DC

## 2012-07-05 MED ORDER — MIDAZOLAM HCL 2 MG/2ML IJ SOLN
2.0000 mg | INTRAMUSCULAR | Status: DC | PRN
  Filled 2012-07-05: qty 2

## 2012-07-05 MED ORDER — LEVALBUTEROL HCL 1.25 MG/0.5ML IN NEBU
1.2500 mg | INHALATION_SOLUTION | Freq: Four times a day (QID) | RESPIRATORY_TRACT | Status: DC
  Administered 2012-07-05 – 2012-07-06 (×3): 1.25 mg via RESPIRATORY_TRACT
  Filled 2012-07-05 (×7): qty 0.5

## 2012-07-05 MED ORDER — ARTIFICIAL TEARS OP OINT
TOPICAL_OINTMENT | OPHTHALMIC | Status: DC | PRN
  Administered 2012-07-05: 1 via OPHTHALMIC

## 2012-07-05 MED ORDER — LACTATED RINGERS IV SOLN
INTRAVENOUS | Status: DC | PRN
  Administered 2012-07-05: 06:00:00 via INTRAVENOUS

## 2012-07-05 MED ORDER — DOCUSATE SODIUM 100 MG PO CAPS
200.0000 mg | ORAL_CAPSULE | Freq: Every day | ORAL | Status: DC
  Administered 2012-07-06 – 2012-07-10 (×4): 200 mg via ORAL
  Filled 2012-07-05 (×5): qty 2

## 2012-07-05 MED ORDER — MAGNESIUM SULFATE 40 MG/ML IJ SOLN
4.0000 g | Freq: Once | INTRAMUSCULAR | Status: AC
  Administered 2012-07-05: 4 g via INTRAVENOUS
  Filled 2012-07-05: qty 100

## 2012-07-05 MED ORDER — THIAMINE HCL 100 MG/ML IJ SOLN
100.0000 mg | Freq: Every day | INTRAMUSCULAR | Status: DC
  Administered 2012-07-05 – 2012-07-07 (×3): 100 mg via INTRAVENOUS
  Filled 2012-07-05 (×4): qty 1

## 2012-07-05 MED ORDER — ALBUMIN HUMAN 5 % IV SOLN
250.0000 mL | INTRAVENOUS | Status: AC | PRN
  Administered 2012-07-05 – 2012-07-06 (×3): 250 mL via INTRAVENOUS
  Filled 2012-07-05: qty 250

## 2012-07-05 MED ORDER — LIDOCAINE HCL (CARDIAC) 20 MG/ML IV SOLN
INTRAVENOUS | Status: DC | PRN
  Administered 2012-07-05: 40 mg via INTRAVENOUS

## 2012-07-05 MED ORDER — LACTATED RINGERS IV SOLN
INTRAVENOUS | Status: DC | PRN
  Administered 2012-07-05: 07:00:00 via INTRAVENOUS

## 2012-07-05 MED ORDER — LEVALBUTEROL HCL 1.25 MG/0.5ML IN NEBU
1.2500 mg | INHALATION_SOLUTION | Freq: Four times a day (QID) | RESPIRATORY_TRACT | Status: DC
  Filled 2012-07-05 (×3): qty 0.5

## 2012-07-05 MED ORDER — ONDANSETRON HCL 4 MG/2ML IJ SOLN: 4.0000 mg | Freq: Four times a day (QID) | INTRAMUSCULAR | Status: DC | PRN

## 2012-07-05 MED ORDER — ACETAMINOPHEN 500 MG PO TABS
1000.0000 mg | ORAL_TABLET | Freq: Four times a day (QID) | ORAL | Status: AC
  Administered 2012-07-05 – 2012-07-10 (×18): 1000 mg via ORAL
  Filled 2012-07-05 (×21): qty 2

## 2012-07-05 MED ORDER — METOPROLOL TARTRATE 1 MG/ML IV SOLN
2.5000 mg | INTRAVENOUS | Status: DC | PRN
  Administered 2012-07-07 – 2012-07-08 (×4): 5 mg via INTRAVENOUS
  Administered 2012-07-08: 2.5 mg via INTRAVENOUS
  Filled 2012-07-05 (×3): qty 5

## 2012-07-05 MED ORDER — OXYCODONE HCL 5 MG PO TABS
5.0000 mg | ORAL_TABLET | ORAL | Status: DC | PRN
  Administered 2012-07-05: 10 mg via ORAL
  Administered 2012-07-06: 5 mg via ORAL
  Administered 2012-07-06 (×2): 10 mg via ORAL
  Administered 2012-07-06: 5 mg via ORAL
  Filled 2012-07-05: qty 2
  Filled 2012-07-05: qty 1
  Filled 2012-07-05: qty 2
  Filled 2012-07-05: qty 1
  Filled 2012-07-05: qty 2

## 2012-07-05 MED ORDER — DOPAMINE-DEXTROSE 3.2-5 MG/ML-% IV SOLN
0.0000 ug/kg/min | INTRAVENOUS | Status: DC
  Administered 2012-07-05: 3 ug/kg/min via INTRAVENOUS

## 2012-07-05 MED ORDER — PANTOPRAZOLE SODIUM 40 MG PO TBEC
40.0000 mg | DELAYED_RELEASE_TABLET | Freq: Every day | ORAL | Status: DC
  Administered 2012-07-07 – 2012-07-11 (×5): 40 mg via ORAL
  Filled 2012-07-05 (×5): qty 1

## 2012-07-05 MED ORDER — PHENYLEPHRINE HCL 10 MG/ML IJ SOLN
0.0000 ug/min | INTRAVENOUS | Status: DC
  Administered 2012-07-05: 0 ug/min via INTRAVENOUS
  Filled 2012-07-05: qty 2

## 2012-07-05 MED ORDER — METOPROLOL TARTRATE 12.5 MG HALF TABLET
12.5000 mg | ORAL_TABLET | Freq: Two times a day (BID) | ORAL | Status: DC
  Filled 2012-07-05: qty 1

## 2012-07-05 MED ORDER — NITROGLYCERIN IN D5W 200-5 MCG/ML-% IV SOLN
0.0000 ug/min | INTRAVENOUS | Status: DC
  Administered 2012-07-05: 25 ug/min via INTRAVENOUS

## 2012-07-05 MED ORDER — ROCURONIUM BROMIDE 100 MG/10ML IV SOLN: INTRAVENOUS | Status: DC | PRN

## 2012-07-05 MED ORDER — PROPOFOL 10 MG/ML IV BOLUS
INTRAVENOUS | Status: DC | PRN
  Administered 2012-07-05: 50 mg via INTRAVENOUS

## 2012-07-05 MED ORDER — AMINOCAPROIC ACID 250 MG/ML IV SOLN: INTRAVENOUS | Status: DC | PRN

## 2012-07-05 MED ORDER — MIDAZOLAM HCL 5 MG/5ML IJ SOLN
INTRAMUSCULAR | Status: DC | PRN
  Administered 2012-07-05: 4 mg via INTRAVENOUS
  Administered 2012-07-05: 2 mg via INTRAVENOUS
  Administered 2012-07-05: 4 mg via INTRAVENOUS
  Administered 2012-07-05 (×5): 2 mg via INTRAVENOUS

## 2012-07-05 MED ORDER — MORPHINE SULFATE 2 MG/ML IJ SOLN
1.0000 mg | INTRAMUSCULAR | Status: AC | PRN
  Administered 2012-07-05 (×2): 2 mg via INTRAVENOUS
  Filled 2012-07-05 (×2): qty 1

## 2012-07-05 MED ORDER — VANCOMYCIN HCL IN DEXTROSE 1-5 GM/200ML-% IV SOLN
1000.0000 mg | Freq: Two times a day (BID) | INTRAVENOUS | Status: DC
  Filled 2012-07-05 (×2): qty 200

## 2012-07-05 MED ORDER — BISACODYL 10 MG RE SUPP: 10.0000 mg | Freq: Every day | RECTAL | Status: DC

## 2012-07-05 MED ORDER — FENTANYL CITRATE 0.05 MG/ML IJ SOLN: INTRAMUSCULAR | Status: DC | PRN

## 2012-07-05 MED ORDER — BUDESONIDE-FORMOTEROL FUMARATE 160-4.5 MCG/ACT IN AERO
2.0000 | INHALATION_SPRAY | Freq: Two times a day (BID) | RESPIRATORY_TRACT | Status: DC
  Administered 2012-07-05 – 2012-07-11 (×12): 2 via RESPIRATORY_TRACT
  Filled 2012-07-05: qty 6

## 2012-07-05 MED ORDER — VANCOMYCIN HCL IN DEXTROSE 1-5 GM/200ML-% IV SOLN
1000.0000 mg | Freq: Two times a day (BID) | INTRAVENOUS | Status: AC
  Administered 2012-07-05 – 2012-07-06 (×3): 1000 mg via INTRAVENOUS
  Filled 2012-07-05 (×3): qty 200

## 2012-07-05 MED ORDER — POTASSIUM CHLORIDE 10 MEQ/50ML IV SOLN
10.0000 meq | INTRAVENOUS | Status: AC
  Administered 2012-07-05 (×3): 10 meq via INTRAVENOUS

## 2012-07-05 MED ORDER — LIVING WELL WITH DIABETES BOOK
Freq: Once | Status: AC
  Administered 2012-07-05: 16:00:00
  Filled 2012-07-05: qty 1

## 2012-07-05 MED ORDER — DEXTROSE 5 % IV SOLN
1.5000 g | Freq: Two times a day (BID) | INTRAVENOUS | Status: DC
  Filled 2012-07-05 (×2): qty 1.5

## 2012-07-05 MED ORDER — SODIUM CHLORIDE 0.9 % IV SOLN
INTRAVENOUS | Status: DC
  Administered 2012-07-05: 20 mL/h via INTRAVENOUS
  Administered 2012-07-07: 21:00:00 via INTRAVENOUS

## 2012-07-05 MED ORDER — HEPARIN SODIUM (PORCINE) 1000 UNIT/ML IJ SOLN
INTRAMUSCULAR | Status: DC | PRN
  Administered 2012-07-05: 5000 [IU] via INTRAVENOUS
  Administered 2012-07-05: 23000 [IU] via INTRAVENOUS

## 2012-07-05 MED ORDER — ASPIRIN 81 MG PO CHEW: 324.0000 mg | CHEWABLE_TABLET | Freq: Every day | ORAL | Status: DC

## 2012-07-05 SURGICAL SUPPLY — 110 items
ADAPTER CARDIO PERF ANTE/RETRO (ADAPTER) ×4 IMPLANT
ATTRACTOMAT 16X20 MAGNETIC DRP (DRAPES) ×4 IMPLANT
BAG DECANTER FOR FLEXI CONT (MISCELLANEOUS) ×4 IMPLANT
BANDAGE ELASTIC 4 VELCRO ST LF (GAUZE/BANDAGES/DRESSINGS) ×4 IMPLANT
BANDAGE ELASTIC 6 VELCRO ST LF (GAUZE/BANDAGES/DRESSINGS) ×4 IMPLANT
BANDAGE GAUZE ELAST BULKY 4 IN (GAUZE/BANDAGES/DRESSINGS) ×4 IMPLANT
BASKET HEART  (ORDER IN 25'S) (MISCELLANEOUS) ×1
BASKET HEART (ORDER IN 25'S) (MISCELLANEOUS) ×1
BASKET HEART (ORDER IN 25S) (MISCELLANEOUS) ×2 IMPLANT
BLADE STERNUM SYSTEM 6 (BLADE) ×4 IMPLANT
BLADE SURG 12 STRL SS (BLADE) ×4 IMPLANT
BLADE SURG ROTATE 9660 (MISCELLANEOUS) ×4 IMPLANT
CANISTER SUCTION 2500CC (MISCELLANEOUS) ×4 IMPLANT
CANNULA AORTIC HI-FLOW 6.5M20F (CANNULA) ×4 IMPLANT
CANNULA GUNDRY RCSP 15FR (MISCELLANEOUS) ×4 IMPLANT
CANNULA VENOUS LOW PROF 34X46 (CANNULA) ×4 IMPLANT
CANNULA VENOUS MAL SGL STG 40 (MISCELLANEOUS) IMPLANT
CANNULAE VENOUS MAL SGL STG 40 (MISCELLANEOUS)
CATH CPB KIT VANTRIGT (MISCELLANEOUS) ×4 IMPLANT
CATH ROBINSON RED A/P 18FR (CATHETERS) ×12 IMPLANT
CATH THORACIC 28FR (CATHETERS) IMPLANT
CATH THORACIC 28FR RT ANG (CATHETERS) IMPLANT
CATH THORACIC 36FR (CATHETERS) IMPLANT
CATH THORACIC 36FR RT ANG (CATHETERS) ×8 IMPLANT
CLIP TI WIDE RED SMALL 24 (CLIP) ×4 IMPLANT
CLOTH BEACON ORANGE TIMEOUT ST (SAFETY) ×4 IMPLANT
COVER SURGICAL LIGHT HANDLE (MISCELLANEOUS) ×4 IMPLANT
CRADLE DONUT ADULT HEAD (MISCELLANEOUS) ×4 IMPLANT
DRAIN CHANNEL 32F RND 10.7 FF (WOUND CARE) ×4 IMPLANT
DRAPE CARDIOVASCULAR INCISE (DRAPES) ×2
DRAPE SLUSH MACHINE 52X66 (DRAPES) IMPLANT
DRAPE SLUSH/WARMER DISC (DRAPES) ×4 IMPLANT
DRAPE SRG 135X102X78XABS (DRAPES) ×2 IMPLANT
DRSG COVADERM 4X14 (GAUZE/BANDAGES/DRESSINGS) ×4 IMPLANT
ELECT BLADE 4.0 EZ CLEAN MEGAD (MISCELLANEOUS) ×4
ELECT BLADE 6.5 EXT (BLADE) ×4 IMPLANT
ELECT CAUTERY BLADE 6.4 (BLADE) ×4 IMPLANT
ELECT REM PT RETURN 9FT ADLT (ELECTROSURGICAL) ×8
ELECTRODE BLDE 4.0 EZ CLN MEGD (MISCELLANEOUS) ×2 IMPLANT
ELECTRODE REM PT RTRN 9FT ADLT (ELECTROSURGICAL) ×4 IMPLANT
GAUZE XEROFORM 5X9 LF (GAUZE/BANDAGES/DRESSINGS) ×4 IMPLANT
GLOVE BIO SURGEON STRL SZ 6 (GLOVE) ×8 IMPLANT
GLOVE BIO SURGEON STRL SZ 6.5 (GLOVE) ×9 IMPLANT
GLOVE BIO SURGEON STRL SZ7 (GLOVE) ×4 IMPLANT
GLOVE BIO SURGEON STRL SZ7.5 (GLOVE) ×8 IMPLANT
GLOVE BIO SURGEON STRL SZ8 (GLOVE) ×8 IMPLANT
GLOVE BIO SURGEONS STRL SZ 6.5 (GLOVE) ×3
GLOVE BIOGEL PI IND STRL 6.5 (GLOVE) ×12 IMPLANT
GLOVE BIOGEL PI IND STRL 7.0 (GLOVE) ×8 IMPLANT
GLOVE BIOGEL PI INDICATOR 6.5 (GLOVE) ×12
GLOVE BIOGEL PI INDICATOR 7.0 (GLOVE) ×8
GOWN EXTRA PROTECTION XL (GOWNS) ×8 IMPLANT
GOWN STRL NON-REIN LRG LVL3 (GOWN DISPOSABLE) ×32 IMPLANT
HEMOSTAT POWDER SURGIFOAM 1G (HEMOSTASIS) ×12 IMPLANT
HEMOSTAT SURGICEL 2X14 (HEMOSTASIS) ×4 IMPLANT
INSERT FOGARTY XLG (MISCELLANEOUS) IMPLANT
KIT BASIN OR (CUSTOM PROCEDURE TRAY) ×4 IMPLANT
KIT ROOM TURNOVER OR (KITS) ×4 IMPLANT
KIT SUCTION CATH 14FR (SUCTIONS) ×4 IMPLANT
KIT VASOVIEW W/TROCAR VH 2000 (KITS) ×4 IMPLANT
LEAD PACING MYOCARDI (MISCELLANEOUS) ×4 IMPLANT
MARKER GRAFT CORONARY BYPASS (MISCELLANEOUS) ×12 IMPLANT
NS IRRIG 1000ML POUR BTL (IV SOLUTION) ×24 IMPLANT
PACK OPEN HEART (CUSTOM PROCEDURE TRAY) ×4 IMPLANT
PAD ARMBOARD 7.5X6 YLW CONV (MISCELLANEOUS) ×8 IMPLANT
PENCIL BUTTON HOLSTER BLD 10FT (ELECTRODE) ×4 IMPLANT
PUNCH AORTIC ROTATE 4.0MM (MISCELLANEOUS) IMPLANT
PUNCH AORTIC ROTATE 4.5MM 8IN (MISCELLANEOUS) ×4 IMPLANT
PUNCH AORTIC ROTATE 5MM 8IN (MISCELLANEOUS) IMPLANT
SET CARDIOPLEGIA MPS 5001102 (MISCELLANEOUS) ×4 IMPLANT
SPONGE GAUZE 4X4 12PLY (GAUZE/BANDAGES/DRESSINGS) ×12 IMPLANT
SUT BONE WAX W31G (SUTURE) ×4 IMPLANT
SUT MNCRL AB 4-0 PS2 18 (SUTURE) ×4 IMPLANT
SUT PROLENE 3 0 SH DA (SUTURE) IMPLANT
SUT PROLENE 3 0 SH1 36 (SUTURE) IMPLANT
SUT PROLENE 4 0 RB 1 (SUTURE) ×2
SUT PROLENE 4 0 SH DA (SUTURE) ×4 IMPLANT
SUT PROLENE 4-0 RB1 .5 CRCL 36 (SUTURE) ×2 IMPLANT
SUT PROLENE 5 0 C 1 36 (SUTURE) IMPLANT
SUT PROLENE 6 0 C 1 30 (SUTURE) IMPLANT
SUT PROLENE 6 0 CC (SUTURE) ×4 IMPLANT
SUT PROLENE 7 0 DA (SUTURE) IMPLANT
SUT PROLENE 7.0 RB 3 (SUTURE) ×12 IMPLANT
SUT PROLENE 8 0 BV175 6 (SUTURE) IMPLANT
SUT PROLENE BLUE 7 0 (SUTURE) ×4 IMPLANT
SUT SILK  1 MH (SUTURE)
SUT SILK 1 MH (SUTURE) IMPLANT
SUT SILK 2 0 SH CR/8 (SUTURE) ×4 IMPLANT
SUT SILK 3 0 SH CR/8 (SUTURE) IMPLANT
SUT STEEL 6MS V (SUTURE) ×8 IMPLANT
SUT STEEL STERNAL CCS#1 18IN (SUTURE) IMPLANT
SUT STEEL SZ 6 DBL 3X14 BALL (SUTURE) ×4 IMPLANT
SUT VIC AB 1 CTX 36 (SUTURE) ×4
SUT VIC AB 1 CTX36XBRD ANBCTR (SUTURE) ×4 IMPLANT
SUT VIC AB 2-0 CT1 27 (SUTURE) ×2
SUT VIC AB 2-0 CT1 TAPERPNT 27 (SUTURE) ×2 IMPLANT
SUT VIC AB 2-0 CTX 27 (SUTURE) IMPLANT
SUT VIC AB 3-0 SH 27 (SUTURE)
SUT VIC AB 3-0 SH 27X BRD (SUTURE) IMPLANT
SUT VIC AB 3-0 X1 27 (SUTURE) IMPLANT
SUT VICRYL 4-0 PS2 18IN ABS (SUTURE) IMPLANT
SUTURE E-PAK OPEN HEART (SUTURE) ×4 IMPLANT
SYSTEM SAHARA CHEST DRAIN ATS (WOUND CARE) ×4 IMPLANT
TAPE CLOTH SURG 4X10 WHT LF (GAUZE/BANDAGES/DRESSINGS) ×4 IMPLANT
TOWEL OR 17X24 6PK STRL BLUE (TOWEL DISPOSABLE) ×8 IMPLANT
TOWEL OR 17X26 10 PK STRL BLUE (TOWEL DISPOSABLE) ×8 IMPLANT
TRAY FOLEY IC TEMP SENS 14FR (CATHETERS) ×4 IMPLANT
TUBING INSUFFLATION 10FT LAP (TUBING) ×4 IMPLANT
UNDERPAD 30X30 INCONTINENT (UNDERPADS AND DIAPERS) ×4 IMPLANT
WATER STERILE IRR 1000ML POUR (IV SOLUTION) ×8 IMPLANT

## 2012-07-05 NOTE — Progress Notes (Signed)
The patient was examined and preop studies reviewed. There has been no change from the prior exam and the patient is ready for surgery.  Plan CABG on Trevor [name] today

## 2012-07-05 NOTE — Anesthesia Postprocedure Evaluation (Signed)
  Anesthesia Post-op Note  Patient: Trevor [name]  Procedure(s) Performed: Procedure(s): CORONARY ARTERY BYPASS GRAFTING (CABG) (N/A) INTRAOPERATIVE TRANSESOPHAGEAL ECHOCARDIOGRAM (N/A)  Patient Location: SICU  Anesthesia Type:General  Level of Consciousness: sedated and Patient remains intubated per anesthesia plan  Airway and Oxygen Therapy: Patient remains intubated per anesthesia plan and Patient placed on Ventilator (see vital sign flow sheet for setting)  Post-op Pain: none  Post-op Assessment: Post-op Vital signs reviewed, Patient's Cardiovascular Status Stable, Respiratory Function Stable, Patent Airway, No signs of Nausea or vomiting and Pain level controlled  Post-op Vital Signs: stable  Complications: No apparent anesthesia complications

## 2012-07-05 NOTE — Progress Notes (Signed)
  Echocardiogram Echocardiogram Transesophageal has been performed.  Georgian Co 07/05/2012, 9:02 AM

## 2012-07-05 NOTE — Brief Op Note (Signed)
07/04/2012 - 07/05/2012  11:31 AM  PATIENT:  Trevor [name]  67 y.o. male  PRE-OPERATIVE DIAGNOSIS:  CAD  POST-OPERATIVE DIAGNOSIS:  Coronary Artery Disease  PROCEDURE:  Procedure(s): CORONARY ARTERY BYPASS GRAFTING x 4 -LIMA to LAD -SVG to Ramus Intermediate -Sequential SVG to PDA and PLVB  ENDOSCOPIC SAPHENOUS VEIN HARVEST RIGHT LEG  INTRAOPERATIVE TRANSESOPHAGEAL ECHOCARDIOGRAM (N/A)  SURGEON:  Surgeon(s) and Role:    * Kerin Perna, MD - Primary  PHYSICIAN ASSISTANT: Lowella Dandy PA-C  ANESTHESIA:   general  EBL:  Total I/O In: 1000 [I.V.:1000] Out: 50 [Urine:50]  BLOOD ADMINISTERED: CELLSAVER  DRAINS: Left pleural chest tube, Mediastinal chest drains   LOCAL MEDICATIONS USED:  NONE  SPECIMEN:  No Specimen  DISPOSITION OF SPECIMEN:  N/A  COUNTS:  YES  TOURNIQUET:  * No tourniquets in log *  DICTATION: .Dragon Dictation  PLAN OF CARE: Admit to inpatient   PATIENT DISPOSITION:  ICU - intubated and hemodynamically stable.   Delay start of Pharmacological VTE agent (>24hrs) due to surgical blood loss or risk of bleeding: yes

## 2012-07-05 NOTE — Progress Notes (Signed)
[facility] BRIEF SICU PROGRESS NOTE  Day of Surgery  S/P Procedure(s) (LRB): CORONARY ARTERY BYPASS GRAFTING (CABG) (N/A) INTRAOPERATIVE TRANSESOPHAGEAL ECHOCARDIOGRAM (N/A)   Waking up on vent NSR w/ stable hemodynamics Chest tube output low UOP adequate Labs okay  Plan:  continue routine early postop  Trevor [name],Trevor [name] 07/05/2012 5:12 PM

## 2012-07-05 NOTE — Procedures (Signed)
Extubation Procedure Note  Patient Details:   Name: Trevor [name] DOB: 02/25/1946 MRN: [id number]   Airway Documentation:   Patient weaned from ventilatory support and extubated. Prior to extubation VC 1008cc's, NIF 36cm, leak test passed. Patient is now wearing nasal cannula with O2 runniong at 4lpm.   Evaluation  O2 sats: stable throughout Complications: No apparent complications Patient did tolerate procedure well. Bilateral Breath Sounds: Clear;Tubular   Yes  Clearance Coots 07/05/2012, 6:55 PM

## 2012-07-05 NOTE — Anesthesia Preprocedure Evaluation (Addendum)
Anesthesia Evaluation  Patient identified by MRN, date of birth, ID band Patient awake    Reviewed: Allergy & Precautions, H&P , NPO status , Patient's Chart, lab work & pertinent test results  Airway Mallampati: I TM Distance: >3 FB Neck ROM: full    Dental  (+) Edentulous Upper, Poor Dentition and Dental Advisory Given   Pulmonary former smoker,          Cardiovascular + CAD and + Past MI Rhythm:regular Rate:Normal     Neuro/Psych    GI/Hepatic   Endo/Other  diabetes  Renal/GU      Musculoskeletal   Abdominal   Peds  Hematology   Anesthesia Other Findings   Reproductive/Obstetrics                          Anesthesia Physical Anesthesia Plan  ASA: III  Anesthesia Plan: General   Post-op Pain Management:    Induction: Intravenous  Airway Management Planned: Oral ETT  Additional Equipment: Arterial line, CVP, PA Cath and TEE  Intra-op Plan:   Post-operative Plan: Post-operative intubation/ventilation  Informed Consent: I have reviewed the patients History and Physical, chart, labs and discussed the procedure including the risks, benefits and alternatives for the proposed anesthesia with the patient or authorized representative who has indicated his/her understanding and acceptance.     Plan Discussed with: CRNA, Anesthesiologist and Surgeon  Anesthesia Plan Comments:         Anesthesia Quick Evaluation

## 2012-07-05 NOTE — Transfer of Care (Signed)
Immediate Anesthesia Transfer of Care Note  Patient: Trevor [name]  Procedure(s) Performed: Procedure(s): CORONARY ARTERY BYPASS GRAFTING (CABG) (N/A) INTRAOPERATIVE TRANSESOPHAGEAL ECHOCARDIOGRAM (N/A)  Patient Location: SICU  Anesthesia Type:General  Level of Consciousness: sedated and Patient remains intubated per anesthesia plan  Airway & Oxygen Therapy: Patient remains intubated per anesthesia plan and Patient placed on Ventilator (see vital sign flow sheet for setting)  Post-op Assessment: Report given to PACU RN and Post -op Vital signs reviewed and stable  Post vital signs: Reviewed and stable  Complications: No apparent anesthesia complications

## 2012-07-05 NOTE — Anesthesia Procedure Notes (Addendum)
Procedure Name: Intubation Date/Time: 07/05/2012 7:54 AM Performed by: Luster Landsberg Pre-anesthesia Checklist: Patient identified, Emergency Drugs available, Suction available and Patient being monitored Patient Re-evaluated:Patient Re-evaluated prior to inductionOxygen Delivery Method: Circle system utilized Preoxygenation: Pre-oxygenation with 100% oxygen Intubation Type: IV induction Ventilation: Mask ventilation without difficulty and Oral airway inserted - appropriate to patient size Laryngoscope Size: Hyacinth Meeker and 2 Grade View: Grade I Tube type: Oral Tube size: 8.0 mm Number of attempts: 1 Airway Equipment and Method: Stylet Placement Confirmation: ETT inserted through vocal cords under direct vision,  positive ETCO2 and breath sounds checked- equal and bilateral Secured at: 23 cm Tube secured with: Tape Dental Injury: Teeth and Oropharynx as per pre-operative assessment     PA catheter:  Routine monitors. Timeout, sterile prep, drape, FBP R neck.  Trendelenburg position.  1% Lido local, finder and trocar RIJ 1st pass with US guidance.  Cordis placed over J wire. PA catheter in easily.  Sterile dressing applied.  Patient tolerated well, VSS.  Sandford Craze, MD   [phone]

## 2012-07-05 NOTE — Progress Notes (Signed)
4/18  Received Diabetes Coordinator consult for new onset diabetes.  Ordered booklet Living Well with Diabetes, ordered consult for dietician when patient able to communicate and receive diet teaching, staff RNs to have patient watch DM videos, teach DM survival skills, teach patient how to do own CBGs when patient is alert and oriented and able to receive information.  Will continue to follow while in hospital.  Will need order for outpatient DM education when discharged.   Smith Mince RN BSN CDE

## 2012-07-05 NOTE — OR Nursing (Signed)
45 minute call made to 2300 at 1206; spoke with Janeann Forehand.  Second call made to 2300 at 1250; spoke with [facility].

## 2012-07-06 ENCOUNTER — Inpatient Hospital Stay (HOSPITAL_COMMUNITY): Payer: Medicare Other

## 2012-07-06 LAB — POCT I-STAT, CHEM 8
BUN: 10 mg/dL (ref 6–23)
Chloride: 102 mEq/L (ref 96–112)
Creatinine, Ser: 0.7 mg/dL (ref 0.50–1.35)
Glucose, Bld: 184 mg/dL — ABNORMAL HIGH (ref 70–99)
Potassium: 3.5 mEq/L (ref 3.5–5.1)
Sodium: 139 mEq/L (ref 135–145)

## 2012-07-06 LAB — CBC
HCT: 31.5 % — ABNORMAL LOW (ref 39.0–52.0)
Hemoglobin: 11.2 g/dL — ABNORMAL LOW (ref 13.0–17.0)
MCH: 30.4 pg (ref 26.0–34.0)
MCHC: 35.6 g/dL (ref 30.0–36.0)
MCHC: 35 g/dL (ref 30.0–36.0)
MCV: 85.6 fL (ref 78.0–100.0)
Platelets: 153 10*3/uL (ref 150–400)
RBC: 3.68 MIL/uL — ABNORMAL LOW (ref 4.22–5.81)
RDW: 13.7 % (ref 11.5–15.5)

## 2012-07-06 LAB — GLUCOSE, CAPILLARY
Glucose-Capillary: 116 mg/dL — ABNORMAL HIGH (ref 70–99)
Glucose-Capillary: 110 mg/dL — ABNORMAL HIGH (ref 70–99)
Glucose-Capillary: 103 mg/dL — ABNORMAL HIGH (ref 70–99)
Glucose-Capillary: 110 mg/dL — ABNORMAL HIGH (ref 70–99)
Glucose-Capillary: 124 mg/dL — ABNORMAL HIGH (ref 70–99)
Glucose-Capillary: 125 mg/dL — ABNORMAL HIGH (ref 70–99)
Glucose-Capillary: 168 mg/dL — ABNORMAL HIGH (ref 70–99)

## 2012-07-06 LAB — CREATININE, SERUM
Creatinine, Ser: 0.72 mg/dL (ref 0.50–1.35)
GFR calc non Af Amer: >90 mL/min (ref >90)

## 2012-07-06 LAB — BASIC METABOLIC PANEL
BUN: 11 mg/dL (ref 6–23)
CO2: 24 mEq/L (ref 19–32)
Calcium: 8.5 mg/dL (ref 8.4–10.5)
GFR calc non Af Amer: >90 mL/min (ref >90)
Glucose, Bld: 112 mg/dL — ABNORMAL HIGH (ref 70–99)

## 2012-07-06 LAB — MAGNESIUM: Magnesium: 1.9 mg/dL (ref 1.5–2.5)

## 2012-07-06 MED ORDER — MORPHINE SULFATE 2 MG/ML IJ SOLN
2.0000 mg | INTRAMUSCULAR | Status: DC | PRN
  Administered 2012-07-08: 2 mg via INTRAVENOUS
  Filled 2012-07-06: qty 1

## 2012-07-06 MED ORDER — POTASSIUM CHLORIDE 10 MEQ/50ML IV SOLN
10.0000 meq | INTRAVENOUS | Status: AC
  Administered 2012-07-06 (×3): 10 meq via INTRAVENOUS

## 2012-07-06 MED ORDER — INSULIN REGULAR HUMAN 100 UNIT/ML IJ SOLN
INTRAMUSCULAR | Status: DC
  Administered 2012-07-07: 02:00:00 via INTRAVENOUS
  Filled 2012-07-06 (×2): qty 1

## 2012-07-06 MED ORDER — FUROSEMIDE 10 MG/ML IJ SOLN
20.0000 mg | Freq: Four times a day (QID) | INTRAMUSCULAR | Status: AC
  Administered 2012-07-06 (×3): 20 mg via INTRAVENOUS
  Filled 2012-07-06 (×3): qty 2

## 2012-07-06 MED ORDER — INSULIN ASPART 100 UNIT/ML ~~LOC~~ SOLN
0.0000 [IU] | SUBCUTANEOUS | Status: DC
  Administered 2012-07-06 (×3): 4 [IU] via SUBCUTANEOUS

## 2012-07-06 MED ORDER — INSULIN DETEMIR 100 UNIT/ML ~~LOC~~ SOLN
20.0000 [IU] | Freq: Two times a day (BID) | SUBCUTANEOUS | Status: DC
  Administered 2012-07-06 (×2): 20 [IU] via SUBCUTANEOUS
  Filled 2012-07-06 (×4): qty 0.2

## 2012-07-06 MED ORDER — LEVALBUTEROL HCL 1.25 MG/0.5ML IN NEBU
1.2500 mg | INHALATION_SOLUTION | Freq: Four times a day (QID) | RESPIRATORY_TRACT | Status: DC | PRN
  Filled 2012-07-06: qty 0.5

## 2012-07-06 NOTE — Progress Notes (Signed)
   CARDIOTHORACIC SURGERY PROGRESS NOTE   R1 Day Post-Op Procedure(s) (LRB): CORONARY ARTERY BYPASS GRAFTING (CABG) (N/A) INTRAOPERATIVE TRANSESOPHAGEAL ECHOCARDIOGRAM (N/A)  Subjective: Looks good.  Mild soreness in chest.  Objective: Vital signs: BP Readings from Last 1 Encounters:  07/06/12 119/68   Pulse Readings from Last 1 Encounters:  07/06/12 91   Resp Readings from Last 1 Encounters:  07/06/12 17   Temp Readings from Last 1 Encounters:  07/06/12 98.8 F (37.1 C)     Hemodynamics: PAP: (21-43)/(11-22) 37/15 mmHg CO:  [4 L/min-6.5 L/min] 6.5 L/min CI:  [1.9 L/min/m2-3.1 L/min/m2] 3.1 L/min/m2  Physical Exam:  Rhythm:   sinus  Breath sounds: clear  Heart sounds:  RRR  Incisions:  Dressings dry  Abdomen:  Soft, non-distended, non-tender  Extremities:  Warm, well-perfused   Intake/Output from previous day: 04/18 0701 - 04/19 0700 In: 5887.1 [P.O.:100; I.V.:3526.1; Blood:381; NG/GT:30; IV Piggyback:1850] Out: 2980 [Urine:1855; Emesis/NG output:100; Blood:625; Chest Tube:400] Intake/Output this shift: Total I/O In: 126.5 [I.V.:76.5; IV Piggyback:50] Out: 170 [Urine:170]  Lab Results:  Recent Labs  07/05/12 2000 07/05/12 2017 07/06/12 0355  WBC 16.8*  --  18.1*  HGB 11.8* 11.2* 11.2*  HCT 33.0* 33.0* 31.5*  PLT 166  --  175   BMET:  Recent Labs  07/05/12 0313  07/05/12 2017 07/06/12 0355  NA 140  < > 142 139  K 3.7  < > 4.6 4.1  CL 104  --  109 109  CO2 28  --   --  24  GLUCOSE 143*  < > 111* 112*  BUN 12  --  9 11  CREATININE 0.78  < > 0.60 0.72  CALCIUM 9.1  --   --  8.5  < > = values in this interval not displayed.  CBG (last 3)   Recent Labs  07/06/12 0613 07/06/12 0654 07/06/12 0812  GLUCAP 110* 124* 125*   ABG    Component Value Date/Time   PHART 7.361 07/05/2012 2013   HCO3 21.2 07/05/2012 2013   TCO2 21 07/05/2012 2017   ACIDBASEDEF 3.0* 07/05/2012 2013   O2SAT 94.0 07/05/2012 2013   CXR: *RADIOLOGY REPORT*  Clinical  Data: Postoperative radiograph. CABG yesterday.  PORTABLE CHEST - 1 VIEW  Comparison: 07/05/2012.  Findings: Mediastinal drain and left thoracostomy tube unchanged.  No pneumothorax. Endotracheal tube and nasogastric tube removed.  Median sternotomy / CABG. Low volume chest with basilar  atelectasis. Increased atelectasis at the left lung base. There  is probably a small left pleural effusion layering posteriorly.  Swan-Ganz catheter present the right IJ approach. Right IJ  vascular sheath present. The tip of the Swan-Ganz catheter is in  the proximal right pulmonary artery.  IMPRESSION:  1. Interval extubation and removal of enteric tube.  2. Other support apparatus in good position.  3. Postoperative changes of CABG. Lower lung volumes with left  greater than right basilar atelectasis. Probable small amount of  left pleural fluid.  Original Report Authenticated By: Andreas Newport, M.D.   Assessment/Plan: S/P Procedure(s) (LRB): CORONARY ARTERY BYPASS GRAFTING (CABG) (N/A) INTRAOPERATIVE TRANSESOPHAGEAL ECHOCARDIOGRAM (N/A)  Doing well POD1 Expected post op acute blood loss anemia, mild, stable Expected post op volume excess, mild, diuresing Type II diabetes mellitus, excellent glycemic control on insulin drip   Mobilize  D/C tubes and lines  Wean dopamine  Add levemir insulin  diuresis   OWEN,CLARENCE H 07/06/2012 9:57 AM

## 2012-07-06 NOTE — Progress Notes (Signed)
Brief Nutrition Note  Received consult for new onset diabetes diet education.  Patient S/P CABG 4/18. Has been extubated, but remains in the ICU. Diet education is not appropriate at this time. RD to monitor progress and follow-up for diet education at a more appropriate time.  Joaquin Courts, RD, LDN, CNSC Pager [phone] After Hours Pager [phone]

## 2012-07-06 NOTE — Progress Notes (Signed)
TCTS BRIEF SICU PROGRESS NOTE  1 Day Post-Op  S/P Procedure(s) (LRB): CORONARY ARTERY BYPASS GRAFTING (CABG) (N/A) INTRAOPERATIVE TRANSESOPHAGEAL ECHOCARDIOGRAM (N/A)   Stable day Ambulated around SICU NSR w/ stable BP Diuresing fairly well  Plan: Continue current plan  OWEN,CLARENCE H 07/06/2012 6:22 PM
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
## 2012-07-07 ENCOUNTER — Inpatient Hospital Stay (HOSPITAL_COMMUNITY): Payer: Medicare Other

## 2012-07-07 LAB — GLUCOSE, CAPILLARY
Glucose-Capillary: 119 mg/dL — ABNORMAL HIGH (ref 70–99)
Glucose-Capillary: 129 mg/dL — ABNORMAL HIGH (ref 70–99)
Glucose-Capillary: 139 mg/dL — ABNORMAL HIGH (ref 70–99)
Glucose-Capillary: 150 mg/dL — ABNORMAL HIGH (ref 70–99)
Glucose-Capillary: 171 mg/dL — ABNORMAL HIGH (ref 70–99)
Glucose-Capillary: 188 mg/dL — ABNORMAL HIGH (ref 70–99)
Glucose-Capillary: 90 mg/dL (ref 70–99)

## 2012-07-07 LAB — BASIC METABOLIC PANEL
Chloride: 100 mEq/L (ref 96–112)
GFR calc Af Amer: >90 mL/min (ref >90)
GFR calc non Af Amer: >90 mL/min (ref >90)
Potassium: 3.2 mEq/L — ABNORMAL LOW (ref 3.5–5.1)
Sodium: 135 mEq/L (ref 135–145)

## 2012-07-07 LAB — CBC
HCT: 31.8 % — ABNORMAL LOW (ref 39.0–52.0)
Hemoglobin: 11.1 g/dL — ABNORMAL LOW (ref 13.0–17.0)
RBC: 3.71 MIL/uL — ABNORMAL LOW (ref 4.22–5.81)

## 2012-07-07 MED ORDER — POTASSIUM CHLORIDE CRYS ER 20 MEQ PO TBCR
20.0000 meq | EXTENDED_RELEASE_TABLET | Freq: Every day | ORAL | Status: DC
  Administered 2012-07-08 – 2012-07-11 (×4): 20 meq via ORAL
  Filled 2012-07-07 (×5): qty 1

## 2012-07-07 MED ORDER — POTASSIUM CHLORIDE 10 MEQ/50ML IV SOLN
10.0000 meq | INTRAVENOUS | Status: AC
  Administered 2012-07-07 (×3): 10 meq via INTRAVENOUS
  Filled 2012-07-07: qty 150

## 2012-07-07 MED ORDER — FUROSEMIDE 40 MG PO TABS
40.0000 mg | ORAL_TABLET | Freq: Every day | ORAL | Status: DC
  Administered 2012-07-08 – 2012-07-11 (×4): 40 mg via ORAL
  Filled 2012-07-07 (×5): qty 1

## 2012-07-07 MED ORDER — INSULIN ASPART 100 UNIT/ML ~~LOC~~ SOLN
0.0000 [IU] | SUBCUTANEOUS | Status: AC
  Administered 2012-07-07: 2 [IU] via SUBCUTANEOUS

## 2012-07-07 MED ORDER — INSULIN ASPART 100 UNIT/ML ~~LOC~~ SOLN
0.0000 [IU] | SUBCUTANEOUS | Status: DC
  Administered 2012-07-07: 4 [IU] via SUBCUTANEOUS

## 2012-07-07 MED ORDER — SODIUM CHLORIDE 0.9 % IV SOLN
INTRAVENOUS | Status: DC
  Administered 2012-07-07: 1.5 [IU]/h via INTRAVENOUS
  Filled 2012-07-07: qty 1

## 2012-07-07 MED ORDER — METOPROLOL TARTRATE 25 MG PO TABS
25.0000 mg | ORAL_TABLET | Freq: Two times a day (BID) | ORAL | Status: DC
  Administered 2012-07-07 – 2012-07-11 (×9): 25 mg via ORAL
  Filled 2012-07-07 (×10): qty 1

## 2012-07-07 MED ORDER — INSULIN DETEMIR 100 UNIT/ML ~~LOC~~ SOLN
28.0000 [IU] | Freq: Two times a day (BID) | SUBCUTANEOUS | Status: DC
  Administered 2012-07-07 – 2012-07-08 (×3): 28 [IU] via SUBCUTANEOUS
  Filled 2012-07-07 (×4): qty 0.28

## 2012-07-07 MED ORDER — POTASSIUM CHLORIDE 10 MEQ/50ML IV SOLN
INTRAVENOUS | Status: AC
  Filled 2012-07-07: qty 150

## 2012-07-07 MED ORDER — POTASSIUM CHLORIDE 10 MEQ/50ML IV SOLN
10.0000 meq | INTRAVENOUS | Status: AC
  Administered 2012-07-07 (×3): 10 meq via INTRAVENOUS

## 2012-07-07 NOTE — Progress Notes (Addendum)
   CARDIOTHORACIC SURGERY PROGRESS NOTE   R2 Days Post-Op Procedure(s) (LRB): CORONARY ARTERY BYPASS GRAFTING (CABG) (N/A) INTRAOPERATIVE TRANSESOPHAGEAL ECHOCARDIOGRAM (N/A)  Subjective: Doing well but had 2 episodes of PAF overnight.  Mild soreness.    Objective: Vital signs: BP Readings from Last 1 Encounters:  07/07/12 100/69   Pulse Readings from Last 1 Encounters:  07/07/12 75   Resp Readings from Last 1 Encounters:  07/07/12 18   Temp Readings from Last 1 Encounters:  07/07/12 98.2 F (36.8 C)     Hemodynamics: PAP: (38-45)/(16-23) 39/20 mmHg  Physical Exam:  Rhythm:   sinus  Breath sounds: clear  Heart sounds:  RRR  Incisions:  Clean and dry  Abdomen:  soft  Extremities:  warm   Intake/Output from previous day: 04/19 0701 - 04/20 0700 In: 1451.3 [P.O.:480; I.V.:471.3; IV Piggyback:500] Out: 2825 [Urine:2725; Chest Tube:100] Intake/Output this shift:    Lab Results:  Recent Labs  07/06/12 1700 07/06/12 1705 07/07/12 0341  WBC 17.3*  --  16.6*  HGB 11.5* 11.6* 11.1*  HCT 32.9* 34.0* 31.8*  PLT 153  --  154   BMET:  Recent Labs  07/06/12 0355  07/06/12 1705 07/07/12 0341  NA 139  --  139 135  K 4.1  --  3.5 3.2*  CL 109  --  102 100  CO2 24  --   --  28  GLUCOSE 112*  --  184* 145*  BUN 11  --  10 11  CREATININE 0.72  < > 0.70 0.66  CALCIUM 8.5  --   --  8.7  < > = values in this interval not displayed.  CBG (last 3)   Recent Labs  07/07/12 0558 07/07/12 0651 07/07/12 0809  GLUCAP 119* 96 94   ABG    Component Value Date/Time   PHART 7.361 07/05/2012 2013   HCO3 21.2 07/05/2012 2013   TCO2 22 07/06/2012 1705   ACIDBASEDEF 3.0* 07/05/2012 2013   O2SAT 94.0 07/05/2012 2013   CXR: *RADIOLOGY REPORT*  Clinical Data: CABG.  PORTABLE CHEST - 1 VIEW  Comparison: 07/06/2012  Findings: Low lung volumes. Prior CABG. Interval removal of left  chest tube. No pneumothorax. Areas of atelectasis bilaterally,  left greater than right.  Suspect small left pleural effusion.  Interval removal of Swan-Ganz catheter.  IMPRESSION:  Left chest tube removal without pneumothorax.  Very low lung volumes with areas of atelectasis bilaterally, left  greater than right and small left effusion.  Original Report Authenticated By: Charlett Nose, M.D.    Assessment/Plan: S/P Procedure(s) (LRB): CORONARY ARTERY BYPASS GRAFTING (CABG) (N/A) INTRAOPERATIVE TRANSESOPHAGEAL ECHOCARDIOGRAM (N/A)  Overall doing well POD2 Expected post op acute blood loss anemia, mild, stable Expected post op volume excess, mild, diuresing Expected post op atelectasis, moderate Post op atrial fibrillation, paroxysmal, non-sustained Hypokalemia, diuretic-induced Type II diabetes mellitus, glycemic control still an issue - back on insulin drip overnight   Mobilize  Increase beta blocker  Add amiodarone if PAF recurs  Increase levemir insulin  Pulm toilet  Supplement K+   OWEN,CLARENCE H 07/07/2012 9:42 AM

## 2012-07-07 NOTE — Progress Notes (Signed)
TCTS BRIEF SICU PROGRESS NOTE  2 Days Post-Op  S/P Procedure(s) (LRB): CORONARY ARTERY BYPASS GRAFTING (CABG) (N/A) INTRAOPERATIVE TRANSESOPHAGEAL ECHOCARDIOGRAM (N/A)   Stable day NSR w/ no further episodes AF CBG's okay but still trending up  Plan: Continue current plan.  Resume insulin drip if CBG's increase further  [name],Trevor H 07/07/2012 6:38 PM

## 2012-07-08 ENCOUNTER — Inpatient Hospital Stay (HOSPITAL_COMMUNITY): Payer: Medicare Other

## 2012-07-08 LAB — BASIC METABOLIC PANEL
BUN: 12 mg/dL (ref 6–23)
Calcium: 8.7 mg/dL (ref 8.4–10.5)
Chloride: 102 mEq/L (ref 96–112)
Creatinine, Ser: 0.68 mg/dL (ref 0.50–1.35)
GFR calc Af Amer: >90 mL/min (ref >90)
GFR calc non Af Amer: >90 mL/min (ref >90)

## 2012-07-08 LAB — CBC
HCT: 31.1 % — ABNORMAL LOW (ref 39.0–52.0)
MCH: 30 pg (ref 26.0–34.0)
MCHC: 35 g/dL (ref 30.0–36.0)
MCV: 85.7 fL (ref 78.0–100.0)
Platelets: 165 10*3/uL (ref 150–400)
RDW: 13.8 % (ref 11.5–15.5)

## 2012-07-08 LAB — GLUCOSE, CAPILLARY
Glucose-Capillary: 158 mg/dL — ABNORMAL HIGH (ref 70–99)
Glucose-Capillary: 59 mg/dL — ABNORMAL LOW (ref 70–99)
Glucose-Capillary: 92 mg/dL (ref 70–99)
Glucose-Capillary: 65 mg/dL — ABNORMAL LOW (ref 70–99)
Glucose-Capillary: 96 mg/dL (ref 70–99)

## 2012-07-08 LAB — TYPE AND SCREEN
ABO/RH(D): O POS
Antibody Screen: NEGATIVE
Unit division: 0
Unit division: 0

## 2012-07-08 MED ORDER — INSULIN ASPART 100 UNIT/ML ~~LOC~~ SOLN: 0.0000 [IU] | SUBCUTANEOUS | Status: DC

## 2012-07-08 MED ORDER — INSULIN DETEMIR 100 UNIT/ML ~~LOC~~ SOLN
30.0000 [IU] | Freq: Two times a day (BID) | SUBCUTANEOUS | Status: DC
  Administered 2012-07-08 – 2012-07-11 (×6): 30 [IU] via SUBCUTANEOUS
  Filled 2012-07-08 (×7): qty 0.3

## 2012-07-08 MED ORDER — SODIUM CHLORIDE 0.9 % IJ SOLN
3.0000 mL | Freq: Two times a day (BID) | INTRAMUSCULAR | Status: DC
  Administered 2012-07-08 – 2012-07-10 (×3): 3 mL via INTRAVENOUS

## 2012-07-08 MED ORDER — SODIUM CHLORIDE 0.9 % IJ SOLN: 3.0000 mL | INTRAMUSCULAR | Status: DC | PRN

## 2012-07-08 MED ORDER — BISACODYL 10 MG RE SUPP: 10.0000 mg | Freq: Every day | RECTAL | Status: DC | PRN

## 2012-07-08 MED ORDER — MOVING RIGHT ALONG BOOK
Freq: Once | Status: AC
  Administered 2012-07-08: 11:00:00
  Filled 2012-07-08: qty 1

## 2012-07-08 MED ORDER — BISACODYL 5 MG PO TBEC: 10.0000 mg | DELAYED_RELEASE_TABLET | Freq: Every day | ORAL | Status: DC | PRN

## 2012-07-08 MED ORDER — SODIUM CHLORIDE 0.9 % IV SOLN: 250.0000 mL | INTRAVENOUS | Status: DC | PRN

## 2012-07-08 MED ORDER — INSULIN ASPART 100 UNIT/ML ~~LOC~~ SOLN: 0.0000 [IU] | SUBCUTANEOUS | Status: AC

## 2012-07-08 MED ORDER — LIVING WELL WITH DIABETES BOOK
Freq: Once | Status: AC
  Administered 2012-07-08: 14:00:00
  Filled 2012-07-08: qty 1

## 2012-07-08 MED ORDER — ZOLPIDEM TARTRATE 5 MG PO TABS: 5.0000 mg | ORAL_TABLET | Freq: Every evening | ORAL | Status: DC | PRN

## 2012-07-08 MED ORDER — INSULIN ASPART 100 UNIT/ML ~~LOC~~ SOLN
0.0000 [IU] | Freq: Three times a day (TID) | SUBCUTANEOUS | Status: DC
  Administered 2012-07-08: 2 [IU] via SUBCUTANEOUS
  Administered 2012-07-08 – 2012-07-09 (×3): 4 [IU] via SUBCUTANEOUS
  Administered 2012-07-09: 12 [IU] via SUBCUTANEOUS
  Administered 2012-07-09 – 2012-07-10 (×3): 2 [IU] via SUBCUTANEOUS
  Administered 2012-07-10: 8 [IU] via SUBCUTANEOUS
  Administered 2012-07-10 – 2012-07-11 (×2): 2 [IU] via SUBCUTANEOUS

## 2012-07-08 MED ORDER — MAGNESIUM HYDROXIDE 400 MG/5ML PO SUSP: 30.0000 mL | Freq: Every day | ORAL | Status: DC | PRN

## 2012-07-08 MED FILL — Magnesium Sulfate Inj 50%: INTRAMUSCULAR | Qty: 10 | Status: AC

## 2012-07-08 MED FILL — Potassium Chloride Inj 2 mEq/ML: INTRAVENOUS | Qty: 40 | Status: AC

## 2012-07-08 NOTE — Progress Notes (Signed)
Hypoglycemic Event  CBG: 59  Treatment: 15 GM carbohydrate snack  Symptoms: Nervous/irritable  Follow-up CBG: Time:0236 CBG Result:79  Possible Reasons for Event: Medication regimen: insulin gtt  Comments/MD notified: will address in AM rounds    Modell Fendrick B  Remember to initiate Hypoglycemia Order Set & complete

## 2012-07-08 NOTE — Progress Notes (Signed)
Diabetes educational packets printed from [employer] and given to patient. Living well with Diabetes booklet also given. Video set up for patient to watch. Trevor [name]

## 2012-07-08 NOTE — Progress Notes (Signed)
Hypoglycemic Event  CBG: 65  Treatment: 15 GM carbohydrate snackx2  Symptoms: None  Follow-up CBG: Time:0352 CBG Result:82  Possible Reasons for Event:   Comments/MD notified:    Trevor [name] B  Remember to initiate Hypoglycemia Order Set & complete

## 2012-07-08 NOTE — Progress Notes (Signed)
Inpatient Diabetes Program Recommendations  AACE/ADA: New Consensus Statement on Inpatient Glycemic Control (2013)  Target Ranges:  Prepandial:   less than 140 mg/dL      Peak postprandial:   less than 180 mg/dL (1-2 hours)      Critically ill patients:  140 - 180 mg/dL   New onset DM, now on Levemir 30 units twice a day with well controlled glucose levels. If patient to be discharged home on insulin, please order insulin starter kit and instruction per staff RN. Will order patient education includling DM ed'l videos per system network, exit care notes, ADA ed book. Pt will get education in cardiac rehab, then may want to consider OP education at the [facility]. RD attempted diet education, but pt was not appropriate for education at that time on 4/19. Will attempt to see patient today to assess further needs. Thank you, Lenor Coffin, RN, CNS, Diabetes Coordinator [phone])

## 2012-07-08 NOTE — Care Management Note (Signed)
    Page 1 of 1   07/08/2012     1:49:31 PM   CARE MANAGEMENT NOTE 07/08/2012  Patient:  Trevor [name], Trevor [name]   Account Number:  [id number]  Date Initiated:  07/04/2012  Documentation initiated by:  Avie Arenas  Subjective/Objective Assessment:   STEMI - emergent cath lab. DKA on insulin drip - plan for CABG.  Independent prior to admission - lives alone.     Action/Plan:   Anticipated DC Date:  07/11/2012   Anticipated DC Plan:  HOME W HOME HEALTH SERVICES         Choice offered to / List presented to:             Status of service:  In process, will continue to follow Medicare Important Message given?   (If response is "NO", the following Medicare IM given date fields will be blank) Date Medicare IM given:   Date Additional Medicare IM given:    Discharge Disposition:    Per UR Regulation:  Reviewed for med. necessity/level of care/duration of stay  If discussed at Long Length of Stay Meetings, dates discussed:    Comments:  ContactLavonna [name] Sister [phone]                  Trevor [name], Trevor [name] [phone]  07-08-12 1:45pm Avie Arenas, RNBSN [phone] Talked with patient who has now moved to progressive unit on 2000.  Confirmed that he does live at home alone but is going to be with his sister 24/7 on discharge.   CM will continue to follow for further needs.

## 2012-07-08 NOTE — Progress Notes (Signed)
Patient transferred over today.  Assessed patient's ambulation status.  Patient stated that he had walked four times today. Will continue to monitor.

## 2012-07-08 NOTE — Progress Notes (Signed)
Pts HR elevated in the 150-160s ST. Pt asymopatmatic. Blood pressure 132/86. PRN 5 mg IV metopolol administered. Will continue to monitor closely. Trevor [name]

## 2012-07-08 NOTE — Progress Notes (Signed)
Pt ambulated 400 ft independently. Tolerated ambulation well. Trevor [name]

## 2012-07-08 NOTE — Progress Notes (Signed)
3 Days Post-Op Procedure(s) (LRB): CORONARY ARTERY BYPASS GRAFTING (CABG) (N/A) INTRAOPERATIVE TRANSESOPHAGEAL ECHOCARDIOGRAM (N/A) Subjective: Urgent CABGx4 Uncontrolled and undiagnosed DM with hyperosmolar syncope on presentation  Objective: Vital signs in last 24 hours: Temp:  [98.1 F (36.7 C)-98.7 F (37.1 C)] 98.7 F (37.1 C) (04/21 0759) Pulse Rate:  [72-142] 86 (04/21 0700) Cardiac Rhythm:  [-] Normal sinus rhythm (04/21 0400) Resp:  [17-27] 23 (04/21 0700) BP: (98-139)/(59-84) 134/78 mmHg (04/21 0700) SpO2:  [93 %-100 %] 98 % (04/21 0700) Weight:  [198 lb 10.2 oz (90.1 kg)] 198 lb 10.2 oz (90.1 kg) (04/21 0500)  Hemodynamic parameters for last 24 hours:  nsr  [place]/Output from previous day: 04/20 0701 - 04/21 0700 In: 335.5 [I.V.:185.5; IV Piggyback:150] Out: 1465 [Urine:1465] Intake/Output this shift:    EXAM  Neuro intact Lungs clear Incisions clean and dry  Lab Results:  Recent Labs  07/07/12 0341 07/08/12 0458  WBC 16.6* 12.3*  HGB 11.1* 10.9*  HCT 31.8* 31.1*  PLT 154 165   BMET:  Recent Labs  07/07/12 0341 07/08/12 0458  NA 135 136  K 3.2* 3.5  CL 100 102  CO2 28 26  GLUCOSE 145* 94  BUN 11 12  CREATININE 0.66 0.68  CALCIUM 8.7 8.7    PT/INR:  Recent Labs  07/05/12 1300  LABPROT 16.2*  INR 1.33   ABG    Component Value Date/Time   PHART 7.361 07/05/2012 2013   HCO3 21.2 07/05/2012 2013   TCO2 22 07/06/2012 1705   ACIDBASEDEF 3.0* 07/05/2012 2013   O2SAT 94.0 07/05/2012 2013   CBG (last 3)   Recent Labs  07/08/12 0352 07/08/12 0520 07/08/12 0801  GLUCAP 82 96 88    Assessment/Plan: S/P Procedure(s) (LRB): CORONARY ARTERY BYPASS GRAFTING (CABG) (N/A) INTRAOPERATIVE TRANSESOPHAGEAL ECHOCARDIOGRAM (N/A) Plan for transfer to step-down: see transfer orders  Cont BID Levemir, diabetic education   LOS: 4 days    VAN TRIGT III,PETER 07/08/2012

## 2012-07-08 NOTE — Progress Notes (Signed)
Pt ambulated 650 ft independently. Pt tolerated ambulation well. Back to bed with call bell in reach. Dion Saucier

## 2012-07-08 NOTE — Progress Notes (Signed)
Chaplain responded to Code STEMI page for pt coming from [facility]. Met pt's sister and daughter when they arrived and took them to a consult room.  They expressed concern that pt had had symptoms for some days but not gone to a doctor. More family came and pt's sister led them in prayer. When cath lab procedure was done the doctor explained to family that pt will need heart bypass surgery and that pt is expected to have a good recovery following that surgery. I took family to see pt in cath lab recovery room and then took them to 2300 waiting area. Pt being transferred to 2300.

## 2012-07-09 ENCOUNTER — Inpatient Hospital Stay (HOSPITAL_COMMUNITY): Payer: Medicare Other

## 2012-07-09 ENCOUNTER — Encounter (HOSPITAL_COMMUNITY): Payer: Self-pay | Admitting: Cardiothoracic Surgery

## 2012-07-09 LAB — BASIC METABOLIC PANEL
BUN: 11 mg/dL (ref 6–23)
CO2: 29 mEq/L (ref 19–32)
Calcium: 9.2 mg/dL (ref 8.4–10.5)
Chloride: 101 mEq/L (ref 96–112)
Creatinine, Ser: 0.89 mg/dL (ref 0.50–1.35)
GFR calc Af Amer: >90 mL/min (ref >90)
GFR calc non Af Amer: 88 mL/min — ABNORMAL LOW (ref >90)
Glucose, Bld: 182 mg/dL — ABNORMAL HIGH (ref 70–99)
Potassium: 4 mEq/L (ref 3.5–5.1)
Sodium: 139 mEq/L (ref 135–145)

## 2012-07-09 LAB — GLUCOSE, CAPILLARY
Glucose-Capillary: 168 mg/dL — ABNORMAL HIGH (ref 70–99)
Glucose-Capillary: 253 mg/dL — ABNORMAL HIGH (ref 70–99)
Glucose-Capillary: 178 mg/dL — ABNORMAL HIGH (ref 70–99)

## 2012-07-09 LAB — CBC
HCT: 34.6 % — ABNORMAL LOW (ref 39.0–52.0)
Hemoglobin: 12 g/dL — ABNORMAL LOW (ref 13.0–17.0)
MCH: 29.8 pg (ref 26.0–34.0)
MCHC: 34.7 g/dL (ref 30.0–36.0)
MCV: 85.9 fL (ref 78.0–100.0)
Platelets: 227 10*3/uL (ref 150–400)
RBC: 4.03 MIL/uL — ABNORMAL LOW (ref 4.22–5.81)
RDW: 13.7 % (ref 11.5–15.5)
WBC: 11.5 10*3/uL — ABNORMAL HIGH (ref 4.0–10.5)

## 2012-07-09 MED ORDER — INSULIN PEN STARTER KIT
1.0000 | Freq: Once | Status: AC
  Administered 2012-07-09: 1
  Filled 2012-07-09: qty 1

## 2012-07-09 MED ORDER — AMIODARONE HCL 200 MG PO TABS
400.0000 mg | ORAL_TABLET | Freq: Two times a day (BID) | ORAL | Status: DC
  Administered 2012-07-09 – 2012-07-11 (×5): 400 mg via ORAL
  Filled 2012-07-09 (×6): qty 2

## 2012-07-09 NOTE — Progress Notes (Signed)
CARDIAC REHAB PHASE I   PRE:  Rate/Rhythm: 119 ST  BP:  Supine:   Sitting: 116/71  Standing:    SaO2: 93 RA  MODE:  Ambulation: 890 ft   POST:  Rate/Rhythm: 112  BP:  Supine:   Sitting: 120/78  Standing:    SaO2: 96 RA 1045-1115 Pt tolerated ambulation well without c/o. Gait steady VS stable. Did start some education with pt. Discussed cholesterol level, diabetes and briefly diet. Encouraged pt to watch recovery from heart surgery video. He states that he has watched all of the diabetic videos.  Melina Copa RN 07/09/2012 11:17 AM

## 2012-07-09 NOTE — Progress Notes (Signed)
Patient has watched DM video 503 and is currently watching 504. Will continue monitor.

## 2012-07-09 NOTE — Progress Notes (Signed)
Motivated and encouraged patient to begin watching his DM videos this morning. Patient has watched 501 and 502. Will continue to monitor.

## 2012-07-09 NOTE — Progress Notes (Signed)
MD order to d/c EPW. Upon assessment of patient's HR, patient was currently in a burst of A fib HR 150s. Pt had immediate conversion back to NSR HR 90. BP 125/55. Pt asymptomatic. Dr. Donata Clay in room with order to keep EPW in place and monitor HR. Order to start po amiodarone 400 mg at 9:00. Will continue to monitor. Dion Saucier

## 2012-07-09 NOTE — Progress Notes (Signed)
Inpatient Diabetes Program Recommendations  AACE/ADA: New Consensus Statement on Inpatient Glycemic Control (2013)  Target Ranges:  Prepandial:   less than 140 mg/dL      Peak postprandial:   less than 180 mg/dL (1-2 hours)      Critically ill patients:  140 - 180 mg/dL   Reason for Visit: New-onset diabetes, S/P CABG  Note: Hbg A1C 14.3.  Currently on Levemir and Novolog correction.  Patient doesn't have a PCP, but plans to obtain one close to where he lives.  Considering [place] or [place].  Patient receptive to Outpatient Diabetes Education follow-up after discharge.  Placed referral to [facility] for outpatient DM education.  Requested they wait 6 - 8 weeks after discharge to schedule.  If patient choses a PCP that has a Diabetes Educator on site, will receive education there instead of at [name].  Patient reportedly doing well with insulin injections.  Demonstrated the use of an insulin pen and patient would like to take insulin by this means.  Ordered an insulin pen starter kit for him.    At discharge, patient will need: RX for a glucose meter and test strips Levemir Insulin pen, (Novolog Flexpen if d/c'd home on correction) and pen-needles  Thank you.  Kabao Leite S. Elsie Lincoln, RN, CNS, CDE Inpatient Diabetes Program, team pager [phone]

## 2012-07-09 NOTE — Progress Notes (Signed)
Pt ambulated in hallway with 1 assist 800 feet, gait steady, tel shows heart rate 100's sinus rhythm during walk, then back down to 70's on return to room Egbert Garibaldi A

## 2012-07-09 NOTE — Progress Notes (Signed)
Pt ambulated 150 ft to new room 2006. Belongings transferred with patient. Trevor [name]

## 2012-07-09 NOTE — Progress Notes (Addendum)
4 Days Post-Op Procedure(s) (LRB): CORONARY ARTERY BYPASS GRAFTING (CABG) (N/A) INTRAOPERATIVE TRANSESOPHAGEAL ECHOCARDIOGRAM (N/A) Subjective:  Trevor [name] has no complaints this morning.  He has received insulin instruction and is currently giving himself his insulin injections  +BM  Objective: Vital signs in last 24 hours: Temp:  [97.7 F (36.5 C)-98.7 F (37.1 C)] 97.7 F (36.5 C) (04/22 0521) Pulse Rate:  [87-110] 90 (04/22 0521) Cardiac Rhythm:  [-] Sinus tachycardia;Normal sinus rhythm (04/21 1930) Resp:  [19-22] 19 (04/22 0521) BP: (119-149)/(65-86) 129/74 mmHg (04/22 0521) SpO2:  [94 %-100 %] 94 % (04/22 0521) Weight:  [216 lb 11.4 oz (98.3 kg)] 216 lb 11.4 oz (98.3 kg) (04/22 0521)  Intake/Output from previous day: 04/21 0701 - 04/22 0700 In: 480 [P.O.:480] Out: -   General appearance: alert, cooperative and no distress Heart: regular rate and rhythm Lungs: clear to auscultation bilaterally Abdomen: soft, non-tender; bowel sounds normal; no masses,  no organomegaly Extremities: edema trace Wound: clean and dry  Lab Results:  Recent Labs  07/08/12 0458 07/09/12 0540  WBC 12.3* 11.5*  HGB 10.9* 12.0*  HCT 31.1* 34.6*  PLT 165 227   BMET:  Recent Labs  07/08/12 0458 07/09/12 0540  NA 136 139  K 3.5 4.0  CL 102 101  CO2 26 29  GLUCOSE 94 182*  BUN 12 11  CREATININE 0.68 0.89  CALCIUM 8.7 9.2    PT/INR: No results found for this basename: LABPROT, INR,  in the last 72 hours ABG    Component Value Date/Time   PHART 7.361 07/05/2012 2013   HCO3 21.2 07/05/2012 2013   TCO2 22 07/06/2012 1705   ACIDBASEDEF 3.0* 07/05/2012 2013   O2SAT 94.0 07/05/2012 2013   CBG (last 3)   Recent Labs  07/08/12 1616 07/08/12 2120 07/09/12 0610  GLUCAP 183* 190* 158*    Assessment/Plan: S/P Procedure(s) (LRB): CORONARY ARTERY BYPASS GRAFTING (CABG) (N/A) INTRAOPERATIVE TRANSESOPHAGEAL ECHOCARDIOGRAM (N/A)  1. CV- Recent STEMI, Bursts of Atrial Fibrillation  with immediate conversion to NSR- rate in the 90s, pressure mildly elevated will increase Lopressor to 50 mg BID 2. Pulm- no acute issues, off oxygen encouraged use of IS 3. Renal- creatinine WNL, remains volume overloaded weight up 10 lbs since admission, continue Lasix 4. DM- new diagnosis, AM sugars have been low may benefit from decrease in Levemir 6. Dispo- patient doing well, will d/c EPW, likely d/c in next 24-48 hours   LOS: 5 days    Trevor [name] 07/09/2012  Start po amio for recurrent afib patient examined and medical record reviewed,agree with above note. Trevor [name],Trevor [name] 07/09/2012

## 2012-07-09 NOTE — Discharge Summary (Signed)
Physician Discharge Summary  Patient ID: Trevor [name] MRN: [id number] DOB/AGE: December 23, 1945 68 y.o.  Admit date: 07/04/2012 Discharge date: 07/10/2012  Admission Diagnoses: 1.s/p STEMI 2.Multivessel CAD 3.History of tobacco abuse 4.Newly diagnosed DM  Discharge Diagnoses:  1.s/p STEMI 2.Multivessel CAD 3.History of tobacco abuse 4.Newly diagnosed DM 5.Mild ABL anemia   Procedure (s):  1.Cardiac catheterization done by Dr. Kirke Corin on 07/04/2012: Coronary angiography:  Coronary dominance: Right  Left Main: Normal in size and mildly calcified. There is a 50% distal stenosis.  Left Anterior Descending (LAD): Normal in size and mildly calcified throughout its course. There is diffuse 20% disease proximally. In the midsegment, there is a 99% tubular stenosis which is likely the culprit for anterior ST elevation myocardial infarction. There is TIMI 2 flow in the vessel.  Circumflex (LCx): Normal in size and nondominant. There is an 80-90% ostial stenosis. In the midsegment, there is an 80% stenosis.  1st obtuse marginal: Normal in size with 82-90% ostial stenosis.  2nd obtuse marginal: Small in size with minor irregularities.  3rd obtuse marginal: Small in size with minor irregularities.  Ramus Intermedius: Normal in size with 50% ostial disease and 70% mid stenosis.  Right Coronary Artery: Very large in size and dominant. The vessel is moderately calcified throughout its course. There is a 90% proximal stenosis. The mid vessel has diffuse 20% disease. There is a 40% distal stenosis.  posterior descending artery: Normal in size with diffuse 90% disease proximally.  posterior lateral branch: PL 1 is small in size with no significant disease. PL 2 is large in size with 90% proximal disease. Left ventriculography: Left ventricular systolic function is moderately reduced , LVEF is estimated at 35-40 %, there is no significant mitral regurgitation . There is anterior and apical wall hypokinesis  2.  Coronary artery bypass grafting x4 (left internal mammary artery to LAD, saphenous vein graft to ramus intermediate, sequential saphenous vein graft to posterior descending and posterior lateral branch of the right coronary artery) with endoscopic harvest of right leg greater saphenous vein by Dr. Donata Clay on 07/05/2012.   History of Presenting Illness: This is a 67 year old male with no known history of coronary artery disease. He presented to [facility] Emergency Department after suffering a syncopal episode while driving. During evaluation, the patient admitted  that he has been suffering numbness and tingling of his left arm, dyspnea on exertion, and chest pain for the past month. His most recent episode of chest pain occurred this morning and was worse than normal. EKG in the ED showed abnormal EKG changes and an elevated Troponin, confirming the presence of a STEMI. Also of note, the patient's blood glucose level was found to be greater than 500. The patient's symptoms had improved, but he was transferred to [facility] for urgent cardiac catheterization. Dr. Kirke Corin performed the cardiac catheterization and results showed severe 3 vessel CAD with involvement of LM and a mildly reduced EF. Due to these findings, it was felt the patient's best treatment option would be coronary artery bypass surgery. The patient has been subsequently admitted to the SICU for further care. A cardiothoracic consultation was obtained.Currently, the patient is stable. He has been placed on an insulin drip for blood sugar control. He remains chest pain free. Cardiac risk factors include advanced age (older than 73 for men, 65 for women), diabetes mellitus, family history of premature cardiovascular disease, male gender and smoking/ tobacco exposure. Dr. Donata Clay discussed potential risks, complications, and benefits of  the surgery and he agreed to proceed. Pre operative carotid duplex US showed no significant bilateral  carotid artery stenosis. His ABI's were 0.82 on the right and > 1 on the left.He underwent a CABG x 4 on 07/05/2012.   Brief Hospital Course:  The patient was extubated the evening of surgery without difficulty. He remained afebrile and hemodynamically stable. Theone Murdoch, a line, chest tubes, and foley were removed early in the post operative course. Lopressor was started .He did have 2 brief episodes of PAF the evening of post op day one. His Lopressor was titrated accordingly. He continued to have brief episodes of Atrial Fibrillation over the next couple of days and was placed on Amiodarone.  He is currently maintaining NSR.  He was volume over loaded and diuresed. He was weaned off the insulin drip. Once he was tolerating a diet, he was started on Insulin.The patient's HGA1C pre op was 14.3. He will require close follow up with a medical doctor for his diabetes management.  He has received inpatient diabetes education in regards to insulin administration and diet requirements. The patient was felt surgically stable for transfer from the ICU to PCTU for further convalescence on 07/08/2012. He continued to progress with cardiac rehab. He was ambulating on room air. He has been tolerating a diet and has had a bowel movement. Epicardial pacing wires and chest tube sutures will be removed prior to discharge. Provided the patient remains afebrile, hemodynamically stable, and pending morning round evaluation, he will be surgically stable for discharge on 07/11/2012.  Latest Vital Signs: Blood pressure 125/55, pulse 94, temperature 97.7 F (36.5 C), temperature source Oral, resp. rate 19, height 6' (1.829 m), weight 98.3 kg (216 lb 11.4 oz), SpO2 94.00%.  Physical Exam: General appearance: alert, cooperative and no distress  Heart: regular rate and rhythm  Lungs: clear to auscultation bilaterally  Abdomen: soft, non-tender; bowel sounds normal; no masses, no organomegaly  Extremities: edema trace  Wound:  clean and dry  Discharge Condition:Stable  Recent laboratory studies:  Lab Results  Component Value Date   WBC 11.5* 07/09/2012   HGB 12.0* 07/09/2012   HCT 34.6* 07/09/2012   MCV 85.9 07/09/2012   PLT 227 07/09/2012   Lab Results  Component Value Date   NA 139 07/09/2012   K 4.0 07/09/2012   CL 101 07/09/2012   CO2 29 07/09/2012   CREATININE 0.89 07/09/2012   GLUCOSE 182* 07/09/2012      Diagnostic Studies: Dg Chest 2 View  07/09/2012  *RADIOLOGY REPORT*  Clinical Data: Status post CABG.  CHEST - 2 VIEW  Comparison: Chest x-ray 07/08/2012.  Findings: Right lung is clear.  Persistent perihilar and left lower lung opacities, most compatible with resolving postoperative atelectasis.  Small left and trace right-sided pleural effusions. Pulmonary vasculature is within normal limits.  Mild distortion of the left side of the cardiopericardial silhouette, similar to the prior examination.  Heart size is normal.  Upper mediastinal contours are within normal limits.  Atherosclerosis in the thoracic aorta.  Status post median sternotomy for CABG.  IMPRESSION: 1.  Persistent but resolving postoperative atelectasis with small left and trace right pleural effusions. 2.  Postoperative changes, as above.   Original Report Authenticated By: Trudie Reed, M.D.     Discharge Medications:    Medication List    TAKE these medications       Alcohol Swabs 70 % Pads  Please use on skin prior to injection of insulin and before checking blood  sugars     amiodarone 200 MG tablet  Commonly known as:  PACERONE  1. Take 400 mg ( 2 Tab) by mouth twice daily for 7 days   2. Then take 200 mg twice daily for 7 days  3. Then take 200 mg daily     aspirin 325 MG EC tablet  Take 1 tablet (325 mg total) by mouth daily.     atorvastatin 80 MG tablet  Commonly known as:  LIPITOR  Take 1 tablet (80 mg total) by mouth daily at 6 PM.     budesonide-formoterol 160-4.5 MCG/ACT inhaler  Commonly known as:   SYMBICORT  Inhale 2 puffs into the lungs 2 (two) times daily.     DSS 100 MG Caps  Take 200 mg by mouth 2 (two) times daily as needed for constipation.     furosemide 40 MG tablet  Commonly known as:  LASIX  Take 1 tablet (40 mg total) by mouth daily. For 5 Days     glucose blood test strip  Use as instructed, Check blood sugars Three times per day with meals and before bedtime     glucose monitoring kit monitoring kit  1 each by Does not apply route as needed for other.     insulin aspart 100 unit/ml Soln  Commonly known as:  novoLOG  Inject 3 Units into the skin 3 (three) times daily with meals. Please Hold if meal time blood sugar is less than 150     insulin detemir 100 unit/ml Soln  Commonly known as:  LEVEMIR  Inject 30 Units subcutaneously twice daily ( 10 AM and 10 PM)     Insulin Pen Needle 32G X 6 MM Misc  1 Device by Does not apply route 5 (five) times daily. Please use clean needle for all insulin injections     lisinopril 5 MG tablet  Commonly known as:  PRINIVIL,ZESTRIL  Take 1 tablet (5 mg total) by mouth daily.     metoprolol tartrate 25 MG tablet  Commonly known as:  LOPRESSOR  Take 1 tablet (25 mg total) by mouth 2 (two) times daily.     oxyCODONE 5 MG immediate release tablet  Commonly known as:  Oxy IR/ROXICODONE  Take 1-2 tablets (5-10 mg total) by mouth every 4 (four) hours as needed.     potassium chloride SA 20 MEQ tablet  Commonly known as:  K-DUR,KLOR-CON  Take 1 tablet (20 mEq total) by mouth daily. For 5 Days       The patient has been discharged on:   1.Beta Blocker:  Yes [  x ]                              No   [   ]                              If No, reason:  2.Ace Inhibitor/ARB: Yes [  x ]                                     No  [    ]  If No, reason:  3.Statin:   Yes [ x  ]                  No  [   ]                  If No, reason:  4.Ecasa:  Yes  [ x  ]                  No   [   ]                   If No, reason:  Follow Up Appointments:     Follow-up Information   Follow up with Lorine Bears, MD. (Call for a follow up appointment for 2 weeks)    Contact information:   [address] [place] [address] [phone]       Follow up with VAN Dinah Beers, MD. (PA/LAT CXR to be taken (at [facility] which is in the same building as Dr. Zenaida Niece Trigt's office) on  at  ;Appointment with Dr. Donata Clay is on 08/07/2012 at 12:00 )    Contact information:   [address] [place] [address] [phone]       Follow up with Medical doctor. (Please obtain a medical doctor and call for an appointment as soon as possible regarding per op HGA1C 14.3 and further diabetes management)       Signed: ZIMMERMAN,DONIELLE MPA-C 07/09/2012, 8:31 AM

## 2012-07-09 NOTE — Progress Notes (Signed)
Notified by telemetry clerk at [phone] that patient went into AFIB with a HR130s-140s. Entered the room to assess the patient and he was laying in bed asleep.  Patient easily aroused and was asymptomatic. Attempted to capture on an EKG but patient had already converted back to NSR HR in the 80s.  It was given in report that patient has short bursts of AFIB at night. Will continue to monitor.

## 2012-07-09 NOTE — Progress Notes (Signed)
Taught patient how to draw up and self administer insulin in case he is discharged on home insulin.  Patient demonstrated understanding by administering his own insulin injection.  Patient was at ease and had great technique. Patient verbalizing understanding. Will continue to monitor.

## 2012-07-10 LAB — GLUCOSE, CAPILLARY

## 2012-07-10 MED ORDER — LISINOPRIL 5 MG PO TABS
5.0000 mg | ORAL_TABLET | Freq: Every day | ORAL | Status: DC
  Administered 2012-07-10 – 2012-07-11 (×2): 5 mg via ORAL
  Filled 2012-07-10 (×2): qty 1

## 2012-07-10 NOTE — Progress Notes (Signed)
CARDIAC REHAB PHASE I   PRE:  Rate/Rhythm: 88SR  BP:  Supine:   Sitting: 119/59  Standing:    SaO2: 93%RA  MODE:  Ambulation: 890 ft   POST:  Rate/Rhythm: 109 ST  BP:  Supine:   Sitting: 140/80  Standing:    SaO2: 94%RA 0950-1010 Just off bedrest from pacing wire removal. Pt walked 890 ft with hand held asst. With steady gait. Did not need to rest. To recliner after walk. Tolerated well. Remained in NSR.   Luetta Nutting, RN BSN  07/10/2012 10:11 AM

## 2012-07-10 NOTE — Progress Notes (Signed)
   SUBJECTIVE: Doing well post CABG. Intermittent A-fib noted. Was started on Amio. No angina.    Filed Vitals:   07/09/12 1637 07/09/12 2113 07/10/12 0629 07/10/12 0725  BP: 106/66 126/62 124/72   Pulse: 95 95 84   Temp: 98.5 F (36.9 C) 99.8 F (37.7 C) 97.5 F (36.4 C)   TempSrc: Oral Oral Oral   Resp: 18 18 18    Height:      Weight:   86.5 kg (190 lb 11.2 oz)   SpO2: 100% 96% 97% 96%    Intake/Output Summary (Last 24 hours) at 07/10/12 0805 Last data filed at 07/10/12 0700  Gross per 24 hour  Intake    680 ml  Output      0 ml  Net    680 ml    LABS: Basic Metabolic Panel:  Recent Labs  16/10/96 0458 07/09/12 0540  NA 136 139  K 3.5 4.0  CL 102 101  CO2 26 29  GLUCOSE 94 182*  BUN 12 11  CREATININE 0.68 0.89  CALCIUM 8.7 9.2   Liver Function Tests: No results found for this basename: AST, ALT, ALKPHOS, BILITOT, PROT, ALBUMIN,  in the last 72 hours No results found for this basename: LIPASE, AMYLASE,  in the last 72 hours CBC:  Recent Labs  07/08/12 0458 07/09/12 0540  WBC 12.3* 11.5*  HGB 10.9* 12.0*  HCT 31.1* 34.6*  MCV 85.7 85.9  PLT 165 227   Cardiac Enzymes: No results found for this basename: CKTOTAL, CKMB, CKMBINDEX, TROPONINI,  in the last 72 hours BNP: No components found with this basename: POCBNP,  D-Dimer: No results found for this basename: DDIMER,  in the last 72 hours Hemoglobin A1C: No results found for this basename: HGBA1C,  in the last 72 hours Fasting Lipid Panel: No results found for this basename: CHOL, HDL, LDLCALC, TRIG, CHOLHDL, LDLDIRECT,  in the last 72 hours Thyroid Function Tests: No results found for this basename: TSH, T4TOTAL, FREET3, T3FREE, THYROIDAB,  in the last 72 hours Anemia Panel: No results found for this basename: VITAMINB12, FOLATE, FERRITIN, TIBC, IRON, RETICCTPCT,  in the last 72 hours   PHYSICAL EXAM General: Well developed, well nourished, in no acute distress HEENT:  Normocephalic and  atramatic Neck:  No JVD.  Lungs: Clear bilaterally to auscultation and percussion. Heart: HRRR . Normal S1 and S2 without gallops or murmurs.  Abdomen: Bowel sounds are positive, abdomen soft and non-tender  Msk:  Back normal, normal gait. Normal strength and tone for age. Extremities: No clubbing, cyanosis or edema.   Neuro: Alert and oriented X 3. Psych:  Good affect, responds appropriately  TELEMETRY: Reviewed telemetry pt in NSR.  ASSESSMENT AND PLAN: Anterior STEMI with 3 vessel CAD. Doing well post CABG. He is on optimal medical therapy. I appreciate the excellent care.    Lorine Bears, MD, [facility] 07/10/2012 8:05 AM

## 2012-07-10 NOTE — Progress Notes (Signed)
5 Days Post-Op Procedure(s) (LRB): CORONARY ARTERY BYPASS GRAFTING (CABG) (N/A) INTRAOPERATIVE TRANSESOPHAGEAL ECHOCARDIOGRAM (N/A) Subjective:  Trevor [name] is without complaints.  He states he had a good night.  + Ambulation, +BM  Objective: Vital signs in last 24 hours: Temp:  [97.5 F (36.4 C)-99.8 F (37.7 C)] 97.5 F (36.4 C) (04/23 0629) Pulse Rate:  [84-95] 84 (04/23 0629) Cardiac Rhythm:  [-] Normal sinus rhythm (04/22 2055) Resp:  [18] 18 (04/23 0629) BP: (106-126)/(55-72) 124/72 mmHg (04/23 0629) SpO2:  [96 %-100 %] 96 % (04/23 0725) Weight:  [190 lb 11.2 oz (86.5 kg)] 190 lb 11.2 oz (86.5 kg) (04/23 0629)  Intake/Output from previous day: 04/22 0701 - 04/23 0700 In: 920 [P.O.:920] Out: -   General appearance: alert, cooperative and no distress Heart: regular rate and rhythm Lungs: clear to auscultation bilaterally Abdomen: soft, non-tender; bowel sounds normal; no masses,  no organomegaly Extremities: edema trace to 1+ Wound: clean and dry  Lab Results:  Recent Labs  07/08/12 0458 07/09/12 0540  WBC 12.3* 11.5*  HGB 10.9* 12.0*  HCT 31.1* 34.6*  PLT 165 227   BMET:  Recent Labs  07/08/12 0458 07/09/12 0540  NA 136 139  K 3.5 4.0  CL 102 101  CO2 26 29  GLUCOSE 94 182*  BUN 12 11  CREATININE 0.68 0.89  CALCIUM 8.7 9.2    PT/INR: No results found for this basename: LABPROT, INR,  in the last 72 hours ABG    Component Value Date/Time   PHART 7.361 07/05/2012 2013   HCO3 21.2 07/05/2012 2013   TCO2 22 07/06/2012 1705   ACIDBASEDEF 3.0* 07/05/2012 2013   O2SAT 94.0 07/05/2012 2013   CBG (last 3)   Recent Labs  07/09/12 1635 07/09/12 2112 07/10/12 0626  GLUCAP 253* 178* 131*    Assessment/Plan: S/P Procedure(s) (LRB): CORONARY ARTERY BYPASS GRAFTING (CABG) (N/A) INTRAOPERATIVE TRANSESOPHAGEAL ECHOCARDIOGRAM (N/A)  1. CV- short bursts of A. FIb, maintaining NSR otherwise- on Lopressor and Amiodarone, will add low dose ACE for recent  MI 2. Pulm- no acute issues, continue IS 3. Volume status- weight trending down, continue diuresis 4. DM- CBGs controlled, will be d/c on insulin 5. Dispo- patient doing well, wires not pulled yesterday due to episode of A.Fib, will remove this morning, likely d/c in AM   LOS: 6 days    Raford Pitcher, Denny Peon 07/10/2012

## 2012-07-10 NOTE — Plan of Care (Signed)
Problem: Food- and Nutrition-Related Knowledge Deficit (NB-1.1) Goal: Nutrition education Formal process to instruct or train a patient/client in a skill or to impart knowledge to help patients/clients voluntarily manage or modify food choices and eating behavior to maintain or improve health. Outcome: Completed/Met Date Met:  07/10/12  RD consulted for nutrition education regarding diabetes.     Lab Results  Component Value Date    HGBA1C 14.3* 07/04/2012    RD provided "Carbohydrate Counting for People with Diabetes" handout from the Academy of Nutrition and Dietetics. Discussed different food groups and their effects on blood sugar, emphasizing carbohydrate-containing foods. Provided list of carbohydrates and recommended serving sizes of common foods.  Discussed importance of controlled and consistent carbohydrate intake throughout the day. Provided examples of ways to balance meals/snacks and encouraged intake of high-fiber, whole grain complex carbohydrates. Teach back method used.  Expect fair compliance.  Body mass index is 25.86 kg/(m^2). Pt meets criteria for Overweight based on current BMI.  Current diet order is Carbohydrate Modified Medium Calorie, patient is consuming approximately 100% of meals at this time.  Labs and medications reviewed.  No further nutrition interventions warranted at this time.  If additional nutrition issues arise, please re-consult RD.  Maureen Chatters, RD, LDN Pager #: [phone] After-Hours Pager #: [phone]

## 2012-07-11 MED ORDER — ALCOHOL SWABS 70 % PADS: MEDICATED_PAD | Status: DC

## 2012-07-11 MED ORDER — OXYCODONE HCL 5 MG PO TABS: 5.0000 mg | ORAL_TABLET | ORAL | Status: DC | PRN

## 2012-07-11 MED ORDER — DSS 100 MG PO CAPS: 200.0000 mg | ORAL_CAPSULE | Freq: Two times a day (BID) | ORAL | Status: DC | PRN

## 2012-07-11 MED ORDER — INSULIN ASPART 100 UNIT/ML FLEXPEN: 3.0000 [IU] | Freq: Three times a day (TID) | SUBCUTANEOUS | Status: DC

## 2012-07-11 MED ORDER — INSULIN PEN NEEDLE 32G X 6 MM MISC: 1.0000 | Freq: Every day | Status: DC

## 2012-07-11 MED ORDER — AMIODARONE HCL 200 MG PO TABS: ORAL_TABLET | ORAL | Status: DC

## 2012-07-11 MED ORDER — ASPIRIN 325 MG PO TBEC: 325.0000 mg | DELAYED_RELEASE_TABLET | Freq: Every day | ORAL | Status: DC

## 2012-07-11 MED ORDER — POTASSIUM CHLORIDE CRYS ER 20 MEQ PO TBCR: 20.0000 meq | EXTENDED_RELEASE_TABLET | Freq: Every day | ORAL | Status: DC

## 2012-07-11 MED ORDER — FREESTYLE SYSTEM KIT: 1.0000 | PACK | Status: DC | PRN

## 2012-07-11 MED ORDER — FUROSEMIDE 40 MG PO TABS: 40.0000 mg | ORAL_TABLET | Freq: Every day | ORAL | Status: DC

## 2012-07-11 MED ORDER — METOPROLOL TARTRATE 25 MG PO TABS: 25.0000 mg | ORAL_TABLET | Freq: Two times a day (BID) | ORAL | Status: DC

## 2012-07-11 MED ORDER — GLUCOSE BLOOD VI STRP: ORAL_STRIP | Status: DC

## 2012-07-11 MED ORDER — LISINOPRIL 5 MG PO TABS: 5.0000 mg | ORAL_TABLET | Freq: Every day | ORAL | Status: DC

## 2012-07-11 MED ORDER — INSULIN DETEMIR 100 UNIT/ML FLEXPEN: SUBCUTANEOUS | Status: DC

## 2012-07-11 MED ORDER — BUDESONIDE-FORMOTEROL FUMARATE 160-4.5 MCG/ACT IN AERO: 2.0000 | INHALATION_SPRAY | Freq: Two times a day (BID) | RESPIRATORY_TRACT | Status: DC

## 2012-07-11 MED ORDER — ATORVASTATIN CALCIUM 80 MG PO TABS: 80.0000 mg | ORAL_TABLET | Freq: Every day | ORAL | Status: DC

## 2012-07-11 NOTE — Progress Notes (Addendum)
6 Days Post-Op Procedure(s) (LRB): CORONARY ARTERY BYPASS GRAFTING (CABG) (N/A) INTRAOPERATIVE TRANSESOPHAGEAL ECHOCARDIOGRAM (N/A) Subjective:  [name] states he feels good and has no complaints.  He has completed diabetes and nutrition education.  He is ambulating +BM  Objective: Vital signs in last 24 hours: Temp:  [98 F (36.7 C)-98.9 F (37.2 C)] 98 F (36.7 C) (04/24 0624) Pulse Rate:  [75-87] 75 (04/24 0624) Cardiac Rhythm:  [-] Normal sinus rhythm (04/23 1950) Resp:  [18] 18 (04/24 0624) BP: (119-130)/(59-72) 130/70 mmHg (04/24 0624) SpO2:  [98 %-99 %] 99 % (04/24 0624) Weight:  [191 lb 9.3 oz (86.9 kg)] 191 lb 9.3 oz (86.9 kg) (04/24 0624)  Intake/Output from previous day: 04/23 0701 - 04/24 0700 In: 840 [P.O.:840] Out: -   General appearance: alert, cooperative and no distress Neurologic: intact Heart: regular rate and rhythm Lungs: clear to auscultation bilaterally Abdomen: soft, non-tender; bowel sounds normal; no masses,  no organomegaly Extremities: edema trace Wound: clean and dry  Lab Results:  Recent Labs  07/09/12 0540  WBC 11.5*  HGB 12.0*  HCT 34.6*  PLT 227   BMET:  Recent Labs  07/09/12 0540  NA 139  K 4.0  CL 101  CO2 29  GLUCOSE 182*  BUN 11  CREATININE 0.89  CALCIUM 9.2    PT/INR: No results found for this basename: LABPROT, INR,  in the last 72 hours ABG    Component Value Date/Time   PHART 7.361 07/05/2012 2013   HCO3 21.2 07/05/2012 2013   TCO2 22 07/06/2012 1705   ACIDBASEDEF 3.0* 07/05/2012 2013   O2SAT 94.0 07/05/2012 2013   CBG (last 3)   Recent Labs  07/10/12 1626 07/10/12 2151 07/11/12 0621  GLUCAP 153* 148* 156*    Assessment/Plan: S/P Procedure(s) (LRB): CORONARY ARTERY BYPASS GRAFTING (CABG) (N/A) INTRAOPERATIVE TRANSESOPHAGEAL ECHOCARDIOGRAM (N/A)  1. CV- NSR, on Lopressor, Amiodarone, Lisinopril 2. Pulm- no acute issues, IS at discharge 3. Volume status- below admission weight, continue LE edema on  diuresis 4. DM- CBGs controlled, insulin education complete 5. Dispo- patient doing well, will d/c home today    LOS: 7 days    BARRETT, ERIN 07/11/2012  patient examined and medical record reviewed,agree with above note. VAN TRIGT III,Nikia Levels 07/11/2012

## 2012-07-11 NOTE — Progress Notes (Addendum)
[id number] Cardiac Rehab Completed discharge education with pt. He voices understanding. Discussed smoking cessation with him. States that he quit 4 months ago. Gave him tips for quitting and coaching contact number information. Pt agrees to Outpt, [facility] in [place], will send referral. Melina Copa RN

## 2012-07-11 NOTE — Progress Notes (Signed)
Chest tube sutures removed at this time per MD order; no oozing noted; steri strips applied; will cont. To monitor.

## 2012-07-19 NOTE — Discharge Summary (Signed)
patient examined and medical record reviewed,agree with above note. VAN TRIGT III,Emilee Market 07/19/2012    

## 2012-07-29 ENCOUNTER — Telehealth: Payer: Self-pay

## 2012-07-29 NOTE — Telephone Encounter (Signed)
FYI

## 2012-07-29 NOTE — Telephone Encounter (Signed)
Pt's sister called to say pt was recently d/c from [facility] s/p CABG Says BP has been running low for the last several days and this am c/o dizziness BP=103-104/54-67 She is unsure of HR Confirms he is drinking plenty of fluids and denies vomiting/diarrhea He has not taken any meds this am I advised to have him hold lisinopril dose this am and see if Bps improve.  I also advised he can try decreasing metoprolol by half for am dose as well if holding the lisinopril does not help Understanding verb Has appt with Dr. Kirke Corin next week i will call back in 2 days to reassess unless they need [place] sooner

## 2012-07-29 NOTE — Telephone Encounter (Signed)
ok 

## 2012-08-05 ENCOUNTER — Other Ambulatory Visit: Payer: Self-pay | Admitting: *Deleted

## 2012-08-05 DIAGNOSIS — I2510 Atherosclerotic heart disease of native coronary artery without angina pectoris: Secondary | ICD-10-CM

## 2012-08-06 ENCOUNTER — Encounter: Payer: Self-pay | Admitting: Cardiovascular Disease

## 2012-08-06 ENCOUNTER — Ambulatory Visit (INDEPENDENT_AMBULATORY_CARE_PROVIDER_SITE_OTHER): Payer: Medicare Other | Admitting: Cardiovascular Disease

## 2012-08-06 VITALS — BP 118/68 | HR 74 | Ht 73.0 in | Wt 186.0 lb

## 2012-08-06 DIAGNOSIS — I255 Ischemic cardiomyopathy: Secondary | ICD-10-CM

## 2012-08-06 DIAGNOSIS — I2589 Other forms of chronic ischemic heart disease: Secondary | ICD-10-CM

## 2012-08-06 DIAGNOSIS — I2510 Atherosclerotic heart disease of native coronary artery without angina pectoris: Secondary | ICD-10-CM

## 2012-08-06 DIAGNOSIS — E785 Hyperlipidemia, unspecified: Secondary | ICD-10-CM

## 2012-08-06 MED ORDER — METOPROLOL SUCCINATE ER 25 MG PO TB24: 25.0000 mg | ORAL_TABLET | Freq: Every day | ORAL | Status: DC

## 2012-08-06 NOTE — Progress Notes (Signed)
HPI  This is a pleasant 67 year old man who is here today for a followup visit regarding coronary artery disease status post recent CABG and ischemic cardiomyopathy. He presented last month with anterior ST elevation myocardial infarction. He was found to have newly diagnosed diabetes with blood sugar over 500. Underwent emergent cardiac catheterization which showed significant three-vessel coronary artery disease and moderate left main stenosis. The LAD was the culprit but had TIMI 2 flow. Thus, the patient was referred for CABG which was performed next day by Dr. Morton Peters without complications. He had postoperative atrial fibrillation which was treated with amiodarone. He has been doing very well without recurrent chest pain or dyspnea. He is able to exercise more. He called our office due to low blood pressure and fatigue. Thus, we stopped his ACE inhibitor. He is taking metoprolol 25 mg once daily. He has not started cardiac rehabilitation yet. He has a new primary care physician but does not know the name yet. His diabetes has been more controlled than before.  No Known Allergies   Current Outpatient Prescriptions on File Prior to Visit  Medication Sig Dispense Refill  . Alcohol Swabs 70 % PADS Please use on skin prior to injection of insulin and before checking blood sugars  200 each  12  . aspirin EC 325 MG EC tablet Take 1 tablet (325 mg total) by mouth daily.  30 tablet    . atorvastatin (LIPITOR) 80 MG tablet Take 1 tablet (80 mg total) by mouth daily at 6 PM.  30 tablet  3  . glucose blood test strip Use as instructed, Check blood sugars Three times per day with meals and before bedtime  100 each  12  . glucose monitoring kit (FREESTYLE) monitoring kit 1 each by Does not apply route as needed for other.  1 each  0  . insulin aspart (NOVOLOG) 100 unit/ml SOLN Inject 3 Units into the skin 3 (three) times daily with meals. Please Hold if meal time blood sugar is less than 150  3 mL  6    . insulin detemir (LEVEMIR) 100 unit/ml SOLN Inject 30 Units subcutaneously twice daily ( 10 AM and 10 PM)  3 mL  6  . Insulin Pen Needle 32G X 6 MM MISC 1 Device by Does not apply route 5 (five) times daily. Please use clean needle for all insulin injections  100 each  12   No current facility-administered medications on file prior to visit.     Past Medical History  Diagnosis Date  . Coronary artery disease     06/2012: Anterior ST elevation myocardial infarction. Cardiac catheterization showed significant three-vessel coronary artery disease, ejection fraction 35-40%. The patient underwent CABG with LIMA to LAD, SVG to ramus and sequential SVG to right PDA/PL  . Ischemic cardiomyopathy     Ejection fraction of 35-40%  . Hyperlipidemia   . Diabetes mellitus without complication      Past Surgical History  Procedure Laterality Date  . Coronary artery bypass graft N/A 07/05/2012    Procedure: CORONARY ARTERY BYPASS GRAFTING (CABG);  Surgeon: Kerin Perna, MD;  Location: [facility] OR;  Service: Open Heart Surgery;  Laterality: N/A;  . Intraoperative transesophageal echocardiogram N/A 07/05/2012    Procedure: INTRAOPERATIVE TRANSESOPHAGEAL ECHOCARDIOGRAM;  Surgeon: Kerin Perna, MD;  Location: [facility] OR;  Service: Open Heart Surgery;  Laterality: N/A;     No family history on file.   History   Social History  . Marital  Status: Widowed    Spouse Name: N/A    Number of Children: N/A  . Years of Education: N/A   Occupational History  . Retired Set designer    Social History Main Topics  . Smoking status: Former Smoker -- 1.00 packs/day for 50 years    Quit date: 04/20/2012  . Smokeless tobacco: Never Used  . Alcohol Use: No  . Drug Use: No  . Sexually Active: Not on file   Other Topics Concern  . Not on file   Social History Narrative   Lives alone.      PHYSICAL EXAM   BP 118/68  Pulse 74  Ht 6\' 1"  (1.854 m)  Wt 186 lb (84.369 kg)  BMI 24.55 kg/m2  SpO2  98% Constitutional: He is oriented to person, place, and time. He appears well-developed and well-nourished. No distress.  HENT: No nasal discharge.  Head: Normocephalic and atraumatic.  Eyes: Pupils are equal and round. Right eye exhibits no discharge. Left eye exhibits no discharge.  Neck: Normal range of motion. Neck supple. No JVD present. No thyromegaly present.  Cardiovascular: Normal rate, regular rhythm, normal heart sounds and. Exam reveals no gallop and no friction rub. No murmur heard.  Pulmonary/Chest: Effort normal and breath sounds normal. No stridor. No respiratory distress. He has no wheezes. He has no rales. He exhibits no tenderness.  Abdominal: Soft. Bowel sounds are normal. He exhibits no distension. There is no tenderness. There is no rebound and no guarding.  Musculoskeletal: Normal range of motion. He exhibits no edema and no tenderness.  Neurological: He is alert and oriented to person, place, and time. Coordination normal.  Skin: Skin is warm and dry. No rash noted. He is not diaphoretic. No erythema. No pallor.  Psychiatric: He has a normal mood and affect. His behavior is normal. Judgment and thought content normal.      ASSESSMENT AND PLAN

## 2012-08-06 NOTE — Patient Instructions (Addendum)
Your physician has requested that you have an echocardiogram in 2 months. Echocardiography is a painless test that uses sound waves to create images of your heart. It provides your doctor with information about the size and shape of your heart and how well your heart's chambers and valves are working. This procedure takes approximately one hour. There are no restrictions for this procedure.  Stop current dose of Metoprolol and Start Metoprolol Succinate 25mg  daily.  Your physician recommends that you schedule a follow-up appointment 1 month after your echo with Dr. Kirke Corin.

## 2012-08-07 ENCOUNTER — Ambulatory Visit
Admission: RE | Admit: 2012-08-07 | Discharge: 2012-08-07 | Disposition: A | Discharge status: Home / Self Care | Payer: Medicare Other | Source: Ambulatory Visit | Attending: Cardiothoracic Surgery | Admitting: Cardiothoracic Surgery

## 2012-08-07 ENCOUNTER — Ambulatory Visit (INDEPENDENT_AMBULATORY_CARE_PROVIDER_SITE_OTHER): Payer: Medicare Other | Admitting: Cardiothoracic Surgery

## 2012-08-07 ENCOUNTER — Encounter: Payer: Self-pay | Admitting: Cardiothoracic Surgery

## 2012-08-07 VITALS — BP 123/84 | HR 88 | Resp 20 | Ht 73.0 in | Wt 187.0 lb

## 2012-08-07 DIAGNOSIS — IMO0001 Reserved for inherently not codable concepts without codable children: Secondary | ICD-10-CM

## 2012-08-07 DIAGNOSIS — I2510 Atherosclerotic heart disease of native coronary artery without angina pectoris: Secondary | ICD-10-CM

## 2012-08-07 DIAGNOSIS — Z951 Presence of aortocoronary bypass graft: Secondary | ICD-10-CM

## 2012-08-07 MED FILL — Sodium Bicarbonate IV Soln 8.4%: INTRAVENOUS | Qty: 50 | Status: AC

## 2012-08-07 MED FILL — Sodium Chloride IV Soln 0.9%: INTRAVENOUS | Qty: 1000 | Status: AC

## 2012-08-07 MED FILL — Heparin Sodium (Porcine) Inj 1000 Unit/ML: INTRAMUSCULAR | Qty: 30 | Status: AC

## 2012-08-07 MED FILL — Electrolyte-R (PH 7.4) Solution: INTRAVENOUS | Qty: 3000 | Status: AC

## 2012-08-07 MED FILL — Lidocaine HCl IV Inj 20 MG/ML: INTRAVENOUS | Qty: 5 | Status: AC

## 2012-08-07 MED FILL — Mannitol IV Soln 20%: INTRAVENOUS | Qty: 500 | Status: AC

## 2012-08-07 MED FILL — Sodium Chloride Irrigation Soln 0.9%: Qty: 3000 | Status: AC

## 2012-08-07 NOTE — Progress Notes (Signed)
PCP is Mand, Demetrius Revel, PA-C Referring Provider is Iran Ouch, MD  Chief Complaint  Patient presents with  . Routine Post Op    3 week f/u from surgery with CXR, S/P CABG x 4 on 07/05/12    HPI: Doing well 6 weeks after CABG Returns with chest x-ray to evaluate small postop left pleural effusion Patient walking around soccer fields 15 minutes twice a day No symptoms of angina, surgical incisions healing well   Past Medical History  Diagnosis Date  . Coronary artery disease     Past Surgical History  Procedure Laterality Date  . Coronary artery bypass graft N/A 07/05/2012    Procedure: CORONARY ARTERY BYPASS GRAFTING (CABG);  Surgeon: Kerin Perna, MD;  Location: [facility] OR;  Service: Open Heart Surgery;  Laterality: N/A;  . Intraoperative transesophageal echocardiogram N/A 07/05/2012    Procedure: INTRAOPERATIVE TRANSESOPHAGEAL ECHOCARDIOGRAM;  Surgeon: Kerin Perna, MD;  Location: [facility] OR;  Service: Open Heart Surgery;  Laterality: N/A;    No family history on file.  Social History History  Substance Use Topics  . Smoking status: Former Smoker -- 1.00 packs/day for 50 years    Quit date: 04/20/2012  . Smokeless tobacco: Never Used  . Alcohol Use: No    Current Outpatient Prescriptions  Medication Sig Dispense Refill  . Alcohol Swabs 70 % PADS Please use on skin prior to injection of insulin and before checking blood sugars  200 each  12  . amiodarone (PACERONE) 200 MG tablet Take 200 mg by mouth daily.      Marland Kitchen aspirin EC 325 MG EC tablet Take 1 tablet (325 mg total) by mouth daily.  30 tablet    . atorvastatin (LIPITOR) 80 MG tablet Take 1 tablet (80 mg total) by mouth daily at 6 PM.  30 tablet  3  . glucose blood test strip Use as instructed, Check blood sugars Three times per day with meals and before bedtime  100 each  12  . glucose monitoring kit (FREESTYLE) monitoring kit 1 each by Does not apply route as needed for other.  1 each  0  . insulin aspart (NOVOLOG)  100 unit/ml SOLN Inject 3 Units into the skin 3 (three) times daily with meals. Please Hold if meal time blood sugar is less than 150  3 mL  6  . insulin detemir (LEVEMIR) 100 unit/ml SOLN Inject 30 Units subcutaneously twice daily ( 10 AM and 10 PM)  3 mL  6  . Insulin Pen Needle 32G X 6 MM MISC 1 Device by Does not apply route 5 (five) times daily. Please use clean needle for all insulin injections  100 each  12  . metFORMIN (GLUCOPHAGE) 500 MG tablet Take 1 tablet twice a day      . metoprolol succinate (TOPROL-XL) 25 MG 24 hr tablet Take 1 tablet (25 mg total) by mouth daily.  90 tablet  6   No current facility-administered medications for this visit.    No Known Allergies  Review of Systems strength and appetite improved No fluid retention symptoms  BP 123/84  Pulse 88  Resp 20  Ht 6\' 1"  (1.854 m)  Wt 187 lb (84.823 kg)  BMI 24.68 kg/m2  SpO2 98% Physical Exam Alert and comfortable Lungs clear bilaterally Heart rhythm regular Surgical incision is well-healed No pedal edema  Diagnostic Tests:  Chest x-ray shows resolution left pleural effusion Impression: Excellent progress after multivessel CABG  Plan: Continue current activity levels, do  not lift more than 20 pounds until 3 months after surgery Start outpatient cardiac rehabilitation at patient's convenience

## 2012-08-08 ENCOUNTER — Encounter: Payer: Self-pay | Admitting: Cardiovascular Disease

## 2012-08-08 DIAGNOSIS — E785 Hyperlipidemia, unspecified: Secondary | ICD-10-CM | POA: Insufficient documentation

## 2012-08-08 DIAGNOSIS — I255 Ischemic cardiomyopathy: Secondary | ICD-10-CM | POA: Insufficient documentation

## 2012-08-08 DIAGNOSIS — I2510 Atherosclerotic heart disease of native coronary artery without angina pectoris: Secondary | ICD-10-CM | POA: Insufficient documentation

## 2012-08-08 NOTE — Assessment & Plan Note (Signed)
He is going to have a followup lipid and liver profile with his primary care physician. For now, continue current dose of atorvastatin.

## 2012-08-08 NOTE — Assessment & Plan Note (Signed)
He is doing very well at this time with no recurrent symptoms of angina. Continue medical therapy. I will switch metoprolol tartrate to succinate 25 mg once daily. I advised him to start cardiac rehabilitation.

## 2012-08-08 NOTE — Assessment & Plan Note (Signed)
Ejection fraction was 35-40% at the time of his myocardial infarction. I will request a followup echocardiogram in 2 months from now to evaluate his LV systolic function. If his EF is low, we might have to resume a small dose ACE inhibitor. He appears to be euvolemic off diuretics.

## 2012-09-02 ENCOUNTER — Encounter: Payer: Self-pay | Admitting: Dietician

## 2012-09-02 ENCOUNTER — Encounter: Payer: Medicare Other | Attending: Cardiothoracic Surgery | Admitting: Dietician

## 2012-09-02 VITALS — Ht 71.25 in | Wt 192.3 lb

## 2012-09-02 DIAGNOSIS — R739 Hyperglycemia, unspecified: Secondary | ICD-10-CM

## 2012-09-02 DIAGNOSIS — Z713 Dietary counseling and surveillance: Secondary | ICD-10-CM | POA: Insufficient documentation

## 2012-09-02 DIAGNOSIS — E119 Type 2 diabetes mellitus without complications: Secondary | ICD-10-CM | POA: Insufficient documentation

## 2012-09-02 DIAGNOSIS — I2510 Atherosclerotic heart disease of native coronary artery without angina pectoris: Secondary | ICD-10-CM

## 2012-09-02 NOTE — Progress Notes (Signed)
Medical Nutrition Therapy:  Appt start time: 0930 end time:  1030.  Assessment:  Primary concerns today: type II DM, s/p CABG.   MEDICATIONS: see list.  MHx: CABG, CAD, Type II DM  Labs: No abnormal renal labs noted, BG 576 and HgbA1c 14.3 on 07/04/12  DIETARY INTAKE:  Usual eating pattern includes 3 meals and 2-3 snacks per day.  Everyday foods include sugar free snacks, toast.  Avoided foods include essentially all sweets (not sugar-free versions), and sugar-sweetened beverages.    24-hr recall:  B ( AM): bacon and eggs with toast (white wheat) and sugar free jelly; or bowl of cereal (cheerios or raisin bran) and a piece of toast, splenda to sweeten; if BG is low in AM, will eat a bowl of fruit before normal breakfast. Sometimes will pick upa  Sausage and egg biscuit. Water or nothing to drink. Occasional coffee black. Snk ( AM): pack of nabs or sugar free jell-o/pudding/ice cream  L ( PM): tunafish from [employer] on 6-inch; tuna salad at home; pB and J sandwich, chicken salad and pimento cheese sandwich. Sometimes a baked potato with vegetable oil butter spread. Beef pot pie.  Snk ( PM): not every day. When hungry, nabs or sugar free pudding/jell-o or saltines with peanut butter D ( PM): tv dinner with meat, starch, veg. Nothing else. Occasionally with get a burger and fries. Snk ( PM): bowl of cereal or yogurt or a sandwich Beverages: diet soda, water, occasional coffee, rarely sweet tea or juice  Pt reports he has made drastic changes to his diet to adopt to appropriate eating for his type II DM. His sister has type II well-controlled, and his niece has type I, so family has been very helpful in making these changes and providing resources. Pt has thoroughly read all resources provided from the hospital and monitors BG TID, with extremely infrequent readings over 150 mg/dL. He also keeps a food log and currently follows a 3-4 CHO per meal plan with 1 CHO each for snacks.   He reports  readings consistently in the 60s for his AM BG.   Usual physical activity: riding mower 5-6 yards. Every day he goes to the park to walk a half mile loop 1.5 times. He does 35-45 minutes per day at one [place] with his grandkids. This has hilly terrain and his HR goes up a decent amount.   Progress Towards Goal(s):  In progress.   Nutritional Diagnosis:  NI-5.8.2 Excessive carbohydrate intake As related to type II DM.  As evidenced by report of intake prior to CABG.    Intervention:  Nutrition education provided regarding pathophysiology of type II DM, as well as CHO impact, and CHO counting. Pt expressed very good understanding via the teach back method. RD also discussed aspects of the TLC diet, although CAD likely much more related to previously sedentary lifestyle, smoking, and uncontrolled DM. RD assigned pt to 11-13 CHO per day diet, and advised pt to make effort to incorporate at least one serving of nonstarchy vegetables each day. RD recommended eventually incorporating muscle strengthening activities to augment his walking. Pt stated interest in cycling also.  Due to low BG in AM, RD recommended F/U with MD about insulin adjustment for evening dose.  Handouts given during visit include:  DM basics  The Plate Method  Foods to Enjoy and Foods to Avoid for Heart Health  Monitoring/Evaluation:  Dietary intake, exercise, CHO count, HgbA1c, and body weight prn.

## 2012-10-07 ENCOUNTER — Ambulatory Visit (HOSPITAL_COMMUNITY): Payer: Medicare Other | Attending: Cardiology | Admitting: Radiology

## 2012-10-07 DIAGNOSIS — E785 Hyperlipidemia, unspecified: Secondary | ICD-10-CM | POA: Insufficient documentation

## 2012-10-07 DIAGNOSIS — I252 Old myocardial infarction: Secondary | ICD-10-CM | POA: Insufficient documentation

## 2012-10-07 DIAGNOSIS — I2589 Other forms of chronic ischemic heart disease: Secondary | ICD-10-CM | POA: Insufficient documentation

## 2012-10-07 DIAGNOSIS — I2510 Atherosclerotic heart disease of native coronary artery without angina pectoris: Secondary | ICD-10-CM

## 2012-10-07 DIAGNOSIS — Z87891 Personal history of nicotine dependence: Secondary | ICD-10-CM | POA: Insufficient documentation

## 2012-10-07 NOTE — Progress Notes (Signed)
Echocardiogram performed.  

## 2012-10-30 ENCOUNTER — Encounter (HOSPITAL_COMMUNITY): Payer: Self-pay | Admitting: *Deleted

## 2012-11-05 ENCOUNTER — Ambulatory Visit (INDEPENDENT_AMBULATORY_CARE_PROVIDER_SITE_OTHER): Payer: Medicare Other | Admitting: Cardiovascular Disease

## 2012-11-05 ENCOUNTER — Encounter: Payer: Self-pay | Admitting: Cardiovascular Disease

## 2012-11-05 VITALS — BP 116/72 | HR 83 | Ht 73.0 in | Wt 195.0 lb

## 2012-11-05 DIAGNOSIS — I2589 Other forms of chronic ischemic heart disease: Secondary | ICD-10-CM

## 2012-11-05 DIAGNOSIS — E785 Hyperlipidemia, unspecified: Secondary | ICD-10-CM

## 2012-11-05 DIAGNOSIS — I2510 Atherosclerotic heart disease of native coronary artery without angina pectoris: Secondary | ICD-10-CM

## 2012-11-05 DIAGNOSIS — I255 Ischemic cardiomyopathy: Secondary | ICD-10-CM

## 2012-11-05 MED ORDER — CARVEDILOL 3.125 MG PO TABS: 3.1250 mg | ORAL_TABLET | Freq: Two times a day (BID) | ORAL | Status: DC

## 2012-11-05 NOTE — Assessment & Plan Note (Signed)
He is doing very well with no recurrent angina. Continue medical therapy. 

## 2012-11-05 NOTE — Progress Notes (Signed)
Primary care provider: Marlan Palau , PA in [place]  HPI  This is a pleasant 67 year old man who is here today for a followup visit regarding coronary artery disease status post recent CABG and ischemic cardiomyopathy. He presented in April of 2014 with anterior ST elevation myocardial infarction. He was found to have newly diagnosed diabetes with blood sugar over 500. Underwent emergent cardiac catheterization which showed significant three-vessel coronary artery disease and moderate left main stenosis. The LAD was the culprit but had TIMI 2 flow. Thus, the patient was referred for CABG which was performed next day by Dr. Morton Peters without complications. He had postoperative atrial fibrillation which was treated with amiodarone.  He has been doing very well without recurrent chest pain or dyspnea. He is able to exercise more and currently walks one to one and a half miles everyday without exertional symptoms. He had symptomatic hypotension postoperatively which required stopping treatment with ACE inhibitor.  Diabetes is currently well controlled. He reports a most recent hemoglobin A1c of 6.4. Echocardiogram in July of this year showed improved LV systolic function with an ejection fraction of 45-50% with apical hypokinesis  No Known Allergies   Current Outpatient Prescriptions on File Prior to Visit  Medication Sig Dispense Refill  . Alcohol Swabs 70 % PADS Please use on skin prior to injection of insulin and before checking blood sugars  200 each  12  . amiodarone (PACERONE) 200 MG tablet Take 200 mg by mouth daily.      Marland Kitchen aspirin EC 325 MG EC tablet Take 1 tablet (325 mg total) by mouth daily.  30 tablet    . atorvastatin (LIPITOR) 80 MG tablet Take 1 tablet (80 mg total) by mouth daily at 6 PM.  30 tablet  3  . glucose blood test strip Use as instructed, Check blood sugars Three times per day with meals and before bedtime  100 each  12  . glucose monitoring kit (FREESTYLE)  monitoring kit 1 each by Does not apply route as needed for other.  1 each  0  . metFORMIN (GLUCOPHAGE) 500 MG tablet Take 1 tablet twice a day      . metoprolol succinate (TOPROL-XL) 25 MG 24 hr tablet Take 1 tablet (25 mg total) by mouth daily.  90 tablet  6   No current facility-administered medications on file prior to visit.     Past Medical History  Diagnosis Date  . Coronary artery disease     06/2012: Anterior ST elevation myocardial infarction. Cardiac catheterization showed significant three-vessel coronary artery disease, ejection fraction 35-40%. The patient underwent CABG with LIMA to LAD, SVG to ramus and sequential SVG to right PDA/PL  . Ischemic cardiomyopathy     Ejection fraction of 35-40%  . Hyperlipidemia   . Diabetes mellitus without complication      Past Surgical History  Procedure Laterality Date  . Coronary artery bypass graft N/A 07/05/2012    Procedure: CORONARY ARTERY BYPASS GRAFTING (CABG);  Surgeon: Kerin Perna, MD;  Location: [facility] OR;  Service: Open Heart Surgery;  Laterality: N/A;  . Intraoperative transesophageal echocardiogram N/A 07/05/2012    Procedure: INTRAOPERATIVE TRANSESOPHAGEAL ECHOCARDIOGRAM;  Surgeon: Kerin Perna, MD;  Location: [facility] OR;  Service: Open Heart Surgery;  Laterality: N/A;     No family history on file.   History   Social History  . Marital Status: Widowed    Spouse Name: N/A    Number of Children: N/A  . Years of  Education: N/A   Occupational History  . Retired Set designer    Social History Main Topics  . Smoking status: Former Smoker -- 1.00 packs/day for 50 years    Quit date: 04/20/2012  . Smokeless tobacco: Never Used  . Alcohol Use: No  . Drug Use: No  . Sexual Activity: Not on file   Other Topics Concern  . Not on file   Social History Narrative   Lives alone.      PHYSICAL EXAM   BP 116/72  Pulse 83  Ht 6\' 1"  (1.854 m)  Wt 195 lb (88.451 kg)  BMI 25.73 kg/m2  SpO2  98% Constitutional: He is oriented to person, place, and time. He appears well-developed and well-nourished. No distress.  HENT: No nasal discharge.  Head: Normocephalic and atraumatic.  Eyes: Pupils are equal and round. Right eye exhibits no discharge. Left eye exhibits no discharge.  Neck: Normal range of motion. Neck supple. No JVD present. No thyromegaly present.  Cardiovascular: Normal rate, regular rhythm, normal heart sounds and. Exam reveals no gallop and no friction rub. No murmur heard.  Pulmonary/Chest: Effort normal and breath sounds normal. No stridor. No respiratory distress. He has no wheezes. He has no rales. He exhibits no tenderness.  Abdominal: Soft. Bowel sounds are normal. He exhibits no distension. There is no tenderness. There is no rebound and no guarding.  Musculoskeletal: Normal range of motion. He exhibits no edema and no tenderness.  Neurological: He is alert and oriented to person, place, and time. Coordination normal.  Skin: Skin is warm and dry. No rash noted. He is not diaphoretic. No erythema. No pallor.  Psychiatric: He has a normal mood and affect. His behavior is normal. Judgment and thought content normal.      ASSESSMENT AND PLAN

## 2012-11-05 NOTE — Patient Instructions (Addendum)
Your physician has recommended you make the following change in your medication: STOP Metoprolol Succinate, START Carvedilol 3.125mg  take one by mouth twice a day  Your physician wants you to follow-up in: 6 MONTHS with Dr Kirke Corin.  You will receive a reminder letter in the mail two months in advance. If you don't receive a letter, please call our office to schedule the follow-up appointment.

## 2012-11-05 NOTE — Assessment & Plan Note (Signed)
Most recent ejection fraction was 45-50%. Due to this and the fact that he is diabetic, I do believe that he would benefit from treatment with an ACE inhibitor. However, he did not tolerate this early postoperatively due to symptomatic hypotension. Blood pressure is still soft. I will consider adding this upon followup. He reports high cost of Toprol and thus I will switch him to carvedilol 3.125 mg twice daily.

## 2012-11-05 NOTE — Assessment & Plan Note (Signed)
He reports having his cholesterol checked with his primary care. The dose of atorvastatin was cut in half. He should continue to be on lifelong treatment with a statin due to his established history of coronary artery disease.

## 2012-11-12 ENCOUNTER — Ambulatory Visit: Payer: Medicare Other | Admitting: Cardiovascular Disease

## 2013-05-06 ENCOUNTER — Ambulatory Visit: Payer: Medicare Other | Admitting: Cardiovascular Disease

## 2013-05-20 ENCOUNTER — Encounter: Payer: Self-pay | Admitting: *Deleted

## 2013-05-20 ENCOUNTER — Ambulatory Visit (INDEPENDENT_AMBULATORY_CARE_PROVIDER_SITE_OTHER): Payer: Medicare Other | Admitting: Cardiovascular Disease

## 2013-05-20 ENCOUNTER — Other Ambulatory Visit: Payer: Self-pay | Admitting: *Deleted

## 2013-05-20 VITALS — BP 132/74 | HR 76 | Ht 73.0 in | Wt 202.0 lb

## 2013-05-20 DIAGNOSIS — I2510 Atherosclerotic heart disease of native coronary artery without angina pectoris: Secondary | ICD-10-CM

## 2013-05-20 DIAGNOSIS — I2589 Other forms of chronic ischemic heart disease: Secondary | ICD-10-CM

## 2013-05-20 DIAGNOSIS — E785 Hyperlipidemia, unspecified: Secondary | ICD-10-CM

## 2013-05-20 DIAGNOSIS — I255 Ischemic cardiomyopathy: Secondary | ICD-10-CM

## 2013-05-20 DIAGNOSIS — I10 Essential (primary) hypertension: Secondary | ICD-10-CM

## 2013-05-20 MED ORDER — ATORVASTATIN CALCIUM 40 MG PO TABS: 40.0000 mg | ORAL_TABLET | Freq: Every day | ORAL | Status: AC

## 2013-05-20 MED ORDER — LISINOPRIL 5 MG PO TABS: 5.0000 mg | ORAL_TABLET | Freq: Every day | ORAL | Status: DC

## 2013-05-20 NOTE — Progress Notes (Signed)
Primary care provider: Dr. Marjory Lies in [place]  HPI  This is a pleasant 68 year old man who is here today for a followup visit regarding coronary artery disease status post  CABG and ischemic cardiomyopathy. He presented in April of 2014 with anterior ST elevation myocardial infarction. He was found to have newly diagnosed diabetes with blood sugar over 500. Underwent emergent cardiac catheterization which showed significant three-vessel coronary artery disease and moderate left main stenosis. The LAD was the culprit but had TIMI 2 flow. Thus, the patient was referred for CABG which was performed next day by Dr. Morton Peters without complications. He had postoperative atrial fibrillation which was treated with amiodarone.  He has been doing very well without recurrent chest pain or dyspnea. Diabetes is currently well controlled. He reports a most recent hemoglobin A1c of 6.5. Echocardiogram in July of this year showed improved LV systolic function with an ejection fraction of 45-50% with apical hypokinesis. He ran out of atorvastatin.  No Known Allergies   Current Outpatient Prescriptions on File Prior to Visit  Medication Sig Dispense Refill  . Alcohol Swabs 70 % PADS Please use on skin prior to injection of insulin and before checking blood sugars  200 each  12  . aspirin EC 325 MG EC tablet Take 1 tablet (325 mg total) by mouth daily.  30 tablet    . carvedilol (COREG) 3.125 MG tablet Take 1 tablet (3.125 mg total) by mouth 2 (two) times daily.  180 tablet  2  . glucose blood test strip Use as instructed, Check blood sugars Three times per day with meals and before bedtime  100 each  12  . glucose monitoring kit (FREESTYLE) monitoring kit 1 each by Does not apply route as needed for other.  1 each  0  . meloxicam (MOBIC) 15 MG tablet TAKE ONE TABLET DAILY      . metFORMIN (GLUCOPHAGE) 500 MG tablet Take 1 tablet twice a day       No current facility-administered medications on  file prior to visit.     Past Medical History  Diagnosis Date  . Coronary artery disease     06/2012: Anterior ST elevation myocardial infarction. Cardiac catheterization showed significant three-vessel coronary artery disease, ejection fraction 35-40%. The patient underwent CABG with LIMA to LAD, SVG to ramus and sequential SVG to right PDA/PL  . Ischemic cardiomyopathy     Ejection fraction of 35-40%  . Hyperlipidemia   . Diabetes mellitus without complication      Past Surgical History  Procedure Laterality Date  . Coronary artery bypass graft N/A 07/05/2012    Procedure: CORONARY ARTERY BYPASS GRAFTING (CABG);  Surgeon: Kerin Perna, MD;  Location: [facility] OR;  Service: Open Heart Surgery;  Laterality: N/A;  . Intraoperative transesophageal echocardiogram N/A 07/05/2012    Procedure: INTRAOPERATIVE TRANSESOPHAGEAL ECHOCARDIOGRAM;  Surgeon: Kerin Perna, MD;  Location: [facility] OR;  Service: Open Heart Surgery;  Laterality: N/A;     Family History  Problem Relation Age of Onset  . Heart attack Brother      History   Social History  . Marital Status: Widowed    Spouse Name: N/A    Number of Children: N/A  . Years of Education: N/A   Occupational History  . Retired Set designer    Social History Main Topics  . Smoking status: Former Smoker -- 1.00 packs/day for 50 years    Quit date: 04/20/2012  . Smokeless tobacco: Never Used  .  Alcohol Use: No  . Drug Use: No  . Sexual Activity: Not on file   Other Topics Concern  . Not on file   Social History Narrative   Lives alone.      PHYSICAL EXAM   BP 132/74  Pulse 76  Ht _0  (1.854 m)  Wt 202 lb (91.627 kg)  BMI 26.66 kg/m2 Constitutional: He is oriented to person, place, and time. He appears well-developed and well-nourished. No distress.  HENT: No nasal discharge.  Head: Normocephalic and atraumatic.  Eyes: Pupils are equal and round. Right eye exhibits no discharge. Left eye exhibits no discharge.    Neck: Normal range of motion. Neck supple. No JVD present. No thyromegaly present.  Cardiovascular: Normal rate, regular rhythm, normal heart sounds and. Exam reveals no gallop and no friction rub. No murmur heard.  Pulmonary/Chest: Effort normal and breath sounds normal. No stridor. No respiratory distress. He has no wheezes. He has no rales. He exhibits no tenderness.  Abdominal: Soft. Bowel sounds are normal. He exhibits no distension. There is no tenderness. There is no rebound and no guarding.  Musculoskeletal: Normal range of motion. He exhibits no edema and no tenderness.  Neurological: He is alert and oriented to person, place, and time. Coordination normal.  Skin: Skin is warm and dry. No rash noted. He is not diaphoretic. No erythema. No pallor.  Psychiatric: He has a normal mood and affect. His behavior is normal. Judgment and thought content normal.      ASSESSMENT AND PLAN

## 2013-05-20 NOTE — Assessment & Plan Note (Signed)
He is doing very well with no recurrent angina. Continue medical therapy.

## 2013-05-20 NOTE — Assessment & Plan Note (Signed)
Most recent ejection fraction was 45-50%. Continue treatment with carvedilol. I added small dose lisinopril 5 mg once daily today. Check basic metabolic profile in one week.

## 2013-05-20 NOTE — Assessment & Plan Note (Signed)
I resumed atorvastatin 40 mg daily. Check fasting lipid and liver profile in 6 weeks.

## 2013-05-20 NOTE — Patient Instructions (Addendum)
Your physician has recommended you make the following change in your medication:   1. Restart back taking atorvastatin 40 mg daily.  2. Start Lisinopril 5 mg daily.  Your physician recommends that you return for lab work in 1 week for bmet on 05/28/13.  Your physician recommends that you return for a FASTING lipid profile: 07/07/13.  Your physician wants you to follow-up in: 6 months with Dr. Fletcher Anon. You will receive a reminder letter in the mail two months in advance. If you don't receive a letter, please call our office to schedule the follow-up appointment.

## 2013-05-28 ENCOUNTER — Other Ambulatory Visit (INDEPENDENT_AMBULATORY_CARE_PROVIDER_SITE_OTHER): Payer: Medicare Other

## 2013-05-28 DIAGNOSIS — I10 Essential (primary) hypertension: Secondary | ICD-10-CM

## 2013-05-28 LAB — BASIC METABOLIC PANEL
BUN: 15 mg/dL (ref 6–23)
CHLORIDE: 100 meq/L (ref 96–112)
CO2: 28 mEq/L (ref 19–32)
Calcium: 10.1 mg/dL (ref 8.4–10.5)
Creatinine, Ser: 0.9 mg/dL (ref 0.4–1.5)
GFR: 87.26 mL/min (ref >60.00)
Glucose, Bld: 156 mg/dL — ABNORMAL HIGH (ref 70–99)
POTASSIUM: 4.3 meq/L (ref 3.5–5.1)
SODIUM: 136 meq/L (ref 135–145)

## 2013-07-07 ENCOUNTER — Other Ambulatory Visit (INDEPENDENT_AMBULATORY_CARE_PROVIDER_SITE_OTHER): Payer: Medicare Other

## 2013-07-07 DIAGNOSIS — E785 Hyperlipidemia, unspecified: Secondary | ICD-10-CM

## 2013-07-07 LAB — LIPID PANEL
CHOLESTEROL: 97 mg/dL (ref 0–200)
HDL: 35.2 mg/dL — AB (ref >39.00)
LDL CALC: 46 mg/dL (ref 0–99)
TRIGLYCERIDES: 77 mg/dL (ref 0.0–149.0)
Total CHOL/HDL Ratio: 3
VLDL: 15.4 mg/dL (ref 0.0–40.0)

## 2013-07-07 LAB — HEPATIC FUNCTION PANEL
ALBUMIN: 4 g/dL (ref 3.5–5.2)
ALT: 20 U/L (ref 0–53)
AST: 20 U/L (ref 0–37)
Alkaline Phosphatase: 92 U/L (ref 39–117)
BILIRUBIN DIRECT: 0.1 mg/dL (ref 0.0–0.3)
TOTAL PROTEIN: 7.3 g/dL (ref 6.0–8.3)
Total Bilirubin: 1 mg/dL (ref 0.3–1.2)

## 2013-11-07 ENCOUNTER — Other Ambulatory Visit: Payer: Self-pay

## 2013-11-07 DIAGNOSIS — E785 Hyperlipidemia, unspecified: Secondary | ICD-10-CM

## 2013-11-07 MED ORDER — CARVEDILOL 3.125 MG PO TABS: 3.1250 mg | ORAL_TABLET | Freq: Two times a day (BID) | ORAL | Status: DC

## 2013-11-25 ENCOUNTER — Ambulatory Visit (INDEPENDENT_AMBULATORY_CARE_PROVIDER_SITE_OTHER): Payer: Medicare Other | Admitting: Cardiovascular Disease

## 2013-11-25 ENCOUNTER — Encounter: Payer: Self-pay | Admitting: Cardiovascular Disease

## 2013-11-25 VITALS — BP 124/60 | HR 80 | Ht 72.0 in | Wt 196.8 lb

## 2013-11-25 DIAGNOSIS — E785 Hyperlipidemia, unspecified: Secondary | ICD-10-CM

## 2013-11-25 DIAGNOSIS — I2510 Atherosclerotic heart disease of native coronary artery without angina pectoris: Secondary | ICD-10-CM

## 2013-11-25 DIAGNOSIS — I2589 Other forms of chronic ischemic heart disease: Secondary | ICD-10-CM

## 2013-11-25 DIAGNOSIS — I255 Ischemic cardiomyopathy: Secondary | ICD-10-CM

## 2013-11-25 NOTE — Progress Notes (Signed)
Primary care provider: Dr. Juanita Craver in [place]  HPI  This is a pleasant 68 year old man who is here today for a followup visit regarding coronary artery disease status post  CABG and ischemic cardiomyopathy. He presented in April of 2014 with anterior ST elevation myocardial infarction. He was found to have newly diagnosed diabetes with blood sugar over 500. Underwent emergent cardiac catheterization which showed significant three-vessel coronary artery disease and moderate left main stenosis. The LAD was the culprit but had TIMI 2 flow. Thus, the patient was referred for CABG which was performed next day by Dr. Nils Pyle without complications. He had postoperative atrial fibrillation which was treated with amiodarone.  He has been doing very well without recurrent chest pain or dyspnea. Diabetes is currently well controlled. He reports a most recent hemoglobin A1c of 6.5. Echocardiogram in July of 2014 showed improved LV systolic function with an ejection fraction of 45-50% with apical hypokinesis. During last visit, Lisinopril was added and Atorvastatin resumed. He is exercising almost daily walking 2 miles.   No Known Allergies   Current Outpatient Prescriptions on File Prior to Visit  Medication Sig Dispense Refill  . aspirin EC 325 MG EC tablet Take 1 tablet (325 mg total) by mouth daily.  30 tablet    . atorvastatin (LIPITOR) 40 MG tablet Take 1 tablet (40 mg total) by mouth daily.  30 tablet  6  . carvedilol (COREG) 3.125 MG tablet Take 1 tablet (3.125 mg total) by mouth 2 (two) times daily.  180 tablet  0  . lisinopril (PRINIVIL,ZESTRIL) 5 MG tablet Take 1 tablet (5 mg total) by mouth daily.  30 tablet  6  . metFORMIN (GLUCOPHAGE) 500 MG tablet Take 1 tablet twice a day       No current facility-administered medications on file prior to visit.     Past Medical History  Diagnosis Date  . Coronary artery disease     06/2012: Anterior ST elevation myocardial infarction.  Cardiac catheterization showed significant three-vessel coronary artery disease, ejection fraction 35-40%. The patient underwent CABG with LIMA to LAD, SVG to ramus and sequential SVG to right PDA/PL  . Ischemic cardiomyopathy     Ejection fraction of 35-40%  . Hyperlipidemia   . Diabetes mellitus without complication      Past Surgical History  Procedure Laterality Date  . Coronary artery bypass graft N/A 07/05/2012    Procedure: CORONARY ARTERY BYPASS GRAFTING (CABG);  Surgeon: Ivin Poot, MD;  Location: [place];  Service: Open Heart Surgery;  Laterality: N/A;  . Intraoperative transesophageal echocardiogram N/A 07/05/2012    Procedure: INTRAOPERATIVE TRANSESOPHAGEAL ECHOCARDIOGRAM;  Surgeon: Ivin Poot, MD;  Location: [place];  Service: Open Heart Surgery;  Laterality: N/A;     Family History  Problem Relation Age of Onset  . Heart attack Brother      History   Social History  . Marital Status: Widowed    Spouse Name: N/A    Number of Children: N/A  . Years of Education: N/A   Occupational History  . Retired Psychologist, educational    Social History Main Topics  . Smoking status: Former Smoker -- 1.00 packs/day for 50 years    Quit date: 04/20/2012  . Smokeless tobacco: Never Used  . Alcohol Use: No  . Drug Use: No  . Sexual Activity: Not on file   Other Topics Concern  . Not on file   Social History Narrative   Lives alone.  PHYSICAL EXAM   BP 124/60  Pulse 80  Ht 6' (1.829 m)  Wt 196 lb 12.8 oz (89.268 kg)  BMI 26.69 kg/m2 Constitutional: He is oriented to person, place, and time. He appears well-developed and well-nourished. No distress.  HENT: No nasal discharge.  Head: Normocephalic and atraumatic.  Eyes: Pupils are equal and round. Right eye exhibits no discharge. Left eye exhibits no discharge.  Neck: Normal range of motion. Neck supple. No JVD present. No thyromegaly present.  Cardiovascular: Normal rate, regular rhythm, normal heart sounds  and. Exam reveals no gallop and no friction rub. No murmur heard.  Pulmonary/Chest: Effort normal and breath sounds normal. No stridor. No respiratory distress. He has no wheezes. He has no rales. He exhibits no tenderness.  Abdominal: Soft. Bowel sounds are normal. He exhibits no distension. There is no tenderness. There is no rebound and no guarding.  Musculoskeletal: Normal range of motion. He exhibits no edema and no tenderness.  Neurological: He is alert and oriented to person, place, and time. Coordination normal.  Skin: Skin is warm and dry. No rash noted. He is not diaphoretic. No erythema. No pallor.  Psychiatric: He has a normal mood and affect. His behavior is normal. Judgment and thought content normal.    EKG: NSR  ASSESSMENT AND PLAN

## 2013-11-25 NOTE — Assessment & Plan Note (Signed)
He is doing very well with no angina. Continue medical therapy.

## 2013-11-25 NOTE — Assessment & Plan Note (Addendum)
Most recent ejection fraction was 45-50%. Continue treatment with carvedilol and  lisinopril 5 mg once daily today.

## 2013-11-25 NOTE — Patient Instructions (Signed)
Your physician wants you to follow-up in: [name] will receive a reminder letter in the mail two months in advance. If you don't receive a letter, please call our office to schedule the follow-up appointment.  Your physician recommends that you continue on your current medications as directed. Please refer to the Current Medication list given to you today.

## 2013-11-25 NOTE — Assessment & Plan Note (Signed)
Lab Results  Component Value Date   CHOL 97 07/07/2013   HDL 35.20* 07/07/2013   LDLCALC 46 07/07/2013   TRIG 77.0 07/07/2013   CHOLHDL 3 07/07/2013   Continue Atorvastatin.

## 2013-12-12 ENCOUNTER — Other Ambulatory Visit: Payer: Self-pay

## 2013-12-12 MED ORDER — LISINOPRIL 5 MG PO TABS: 5.0000 mg | ORAL_TABLET | Freq: Every day | ORAL | Status: DC

## 2014-02-20 ENCOUNTER — Other Ambulatory Visit: Payer: Self-pay

## 2014-02-20 MED ORDER — CARVEDILOL 3.125 MG PO TABS: 3.1250 mg | ORAL_TABLET | Freq: Two times a day (BID) | ORAL | Status: DC

## 2014-02-26 ENCOUNTER — Encounter (HOSPITAL_COMMUNITY): Payer: Self-pay | Admitting: Cardiovascular Disease

## 2014-07-14 ENCOUNTER — Other Ambulatory Visit: Payer: Self-pay | Admitting: Cardiovascular Disease

## 2014-07-14 MED ORDER — LISINOPRIL 5 MG PO TABS: 5.0000 mg | ORAL_TABLET | Freq: Every day | ORAL | Status: DC

## 2014-08-24 ENCOUNTER — Other Ambulatory Visit: Payer: Self-pay | Admitting: *Deleted

## 2014-08-24 MED ORDER — CARVEDILOL 3.125 MG PO TABS: 3.1250 mg | ORAL_TABLET | Freq: Two times a day (BID) | ORAL | Status: DC

## 2014-11-10 ENCOUNTER — Other Ambulatory Visit: Payer: Self-pay | Admitting: Cardiovascular Disease

## 2015-02-05 ENCOUNTER — Other Ambulatory Visit: Payer: Self-pay | Admitting: Cardiovascular Disease

## 2015-02-09 ENCOUNTER — Other Ambulatory Visit: Payer: Self-pay | Admitting: Cardiovascular Disease

## 2015-02-16 ENCOUNTER — Ambulatory Visit (INDEPENDENT_AMBULATORY_CARE_PROVIDER_SITE_OTHER): Payer: Medicare Other | Admitting: Cardiovascular Disease

## 2015-02-16 ENCOUNTER — Encounter: Payer: Self-pay | Admitting: Cardiovascular Disease

## 2015-02-16 VITALS — BP 118/70 | HR 83 | Ht 72.0 in | Wt 191.8 lb

## 2015-02-16 DIAGNOSIS — I25118 Atherosclerotic heart disease of native coronary artery with other forms of angina pectoris: Secondary | ICD-10-CM | POA: Diagnosis not present

## 2015-02-16 DIAGNOSIS — M79604 Pain in right leg: Secondary | ICD-10-CM

## 2015-02-16 DIAGNOSIS — I739 Peripheral vascular disease, unspecified: Secondary | ICD-10-CM | POA: Diagnosis not present

## 2015-02-16 DIAGNOSIS — R0602 Shortness of breath: Secondary | ICD-10-CM

## 2015-02-16 DIAGNOSIS — I255 Ischemic cardiomyopathy: Secondary | ICD-10-CM

## 2015-02-16 DIAGNOSIS — E785 Hyperlipidemia, unspecified: Secondary | ICD-10-CM

## 2015-02-16 DIAGNOSIS — M79605 Pain in left leg: Secondary | ICD-10-CM

## 2015-02-16 NOTE — Progress Notes (Signed)
Primary care provider: Dr. Juanita Craver in [place]  HPI  This is a pleasant 69 year old man who is here today for a followup visit regarding coronary artery disease status post  CABG and ischemic cardiomyopathy. He presented in April of 2014 with anterior ST elevation myocardial infarction and was found to have significant three-vessel coronary artery disease and moderate left main stenosis. The patient underwent CABG at that time.  Echocardiogram in July of 2014 showed improved LV systolic function with an ejection fraction of 45-50% with apical hypokinesis. He denies any chest pain. However, he complains of worsening exertional dyspnea. He did not have chest pain during his prior myocardial infarction and at that time his angina was actually shortness of breath. He reports that his diabetes is now under good control. He is also complaining of bilateral hip and lower back pain with walking. This happens after walking about one fourth of a mile and started about 3-4 months ago.  No Known Allergies   Current Outpatient Prescriptions on File Prior to Visit  Medication Sig Dispense Refill  . aspirin EC 325 MG EC tablet Take 1 tablet (325 mg total) by mouth daily. 30 tablet   . atorvastatin (LIPITOR) 40 MG tablet Take 1 tablet (40 mg total) by mouth daily. 30 tablet 6  . carvedilol (COREG) 3.125 MG tablet TAKE ONE TABLET BY MOUTH TWICE DAILY 180 tablet 3  . lisinopril (PRINIVIL,ZESTRIL) 5 MG tablet TAKE ONE TABLET BY MOUTH DAILY 30 tablet 0  . metFORMIN (GLUCOPHAGE) 500 MG tablet Take 500 mg by mouth 2 (two) times daily with a meal. Take 1 tablet twice a day     No current facility-administered medications on file prior to visit.     Past Medical History  Diagnosis Date  . Coronary artery disease     06/2012: Anterior ST elevation myocardial infarction. Cardiac catheterization showed significant three-vessel coronary artery disease, ejection fraction 35-40%. The patient underwent CABG  with LIMA to LAD, SVG to ramus and sequential SVG to right PDA/PL  . Ischemic cardiomyopathy     Ejection fraction of 35-40%  . Hyperlipidemia   . Diabetes mellitus without complication [facility])      Past Surgical History  Procedure Laterality Date  . Coronary artery bypass graft N/A 07/05/2012    Procedure: CORONARY ARTERY BYPASS GRAFTING (CABG);  Surgeon: Ivin Poot, MD;  Location: [place];  Service: Open Heart Surgery;  Laterality: N/A;  . Intraoperative transesophageal echocardiogram N/A 07/05/2012    Procedure: INTRAOPERATIVE TRANSESOPHAGEAL ECHOCARDIOGRAM;  Surgeon: Ivin Poot, MD;  Location: [place];  Service: Open Heart Surgery;  Laterality: N/A;  . Left heart catheterization with coronary angiogram N/A 07/04/2012    Procedure: LEFT HEART CATHETERIZATION WITH CORONARY ANGIOGRAM;  Surgeon: Wellington Hampshire, MD;  Location: [place] CATH LAB;  Service: Cardiovascular;  Laterality: N/A;     Family History  Problem Relation Age of Onset  . Heart attack Brother      Social History   Social History  . Marital Status: Widowed    Spouse Name: N/A  . Number of Children: N/A  . Years of Education: N/A   Occupational History  . Retired Psychologist, educational    Social History Main Topics  . Smoking status: Former Smoker -- 1.00 packs/day for 50 years    Quit date: 04/20/2012  . Smokeless tobacco: Never Used  . Alcohol Use: No  . Drug Use: No  . Sexual Activity: Not on file   Other Topics Concern  .  Not on file   Social History Narrative   Lives alone.      PHYSICAL EXAM   BP 118/70 mmHg  Pulse 83  Ht 6' (1.829 m)  Wt 191 lb 12.8 oz (87 kg)  BMI 26.01 kg/m2  SpO2 99% Constitutional: He is oriented to person, place, and time. He appears well-developed and well-nourished. No distress.  HENT: No nasal discharge.  Head: Normocephalic and atraumatic.  Eyes: Pupils are equal and round. Right eye exhibits no discharge. Left eye exhibits no discharge.  Neck: Normal range of  motion. Neck supple. No JVD present. No thyromegaly present.  Cardiovascular: Normal rate, regular rhythm, normal heart sounds and. Exam reveals no gallop and no friction rub. No murmur heard.  Pulmonary/Chest: Effort normal and breath sounds normal. No stridor. No respiratory distress. He has no wheezes. He has no rales. He exhibits no tenderness.  Abdominal: Soft. Bowel sounds are normal. He exhibits no distension. There is no tenderness. There is no rebound and no guarding.  Musculoskeletal: Normal range of motion. He exhibits no edema and no tenderness.  Neurological: He is alert and oriented to person, place, and time. Coordination normal.  Skin: Skin is warm and dry. No rash noted. He is not diaphoretic. No erythema. No pallor.  Psychiatric: He has a normal mood and affect. His behavior is normal. Judgment and thought content normal.  Vascular: Femoral pulses are normal. Distal pulses are faint bilaterally.  EKG: NSR with sinus arrhythmia and nonspecific T wave changes.  ASSESSMENT AND PLAN

## 2015-02-16 NOTE — Patient Instructions (Signed)
Medication Instructions:  Your physician recommends that you continue on your current medications as directed. Please refer to the Current Medication list given to you today.  Labwork: No new orders.   Testing/Procedures: Your physician has requested that you have a lower extremity arterial doppler. This test is an ultrasound of the arteries in the legs. It looks at arterial blood flow in the legs. Allow one hour for Lower Arterial scans. There are no restrictions or special instructions   Your physician has requested that you have a lexiscan myoview. For further information please visit [email or web address]. Please follow instruction sheet, as given.  Follow-Up: Your physician wants you to follow-up in: 1 YEAR with Dr Fletcher Anon.  You will receive a reminder letter in the mail two months in advance. If you don't receive a letter, please call our office to schedule the follow-up appointment.   Any Other Special Instructions Will Be Listed Below (If Applicable).     If you need a refill on your cardiac medications before your next appointment, please call your pharmacy.

## 2015-02-17 ENCOUNTER — Other Ambulatory Visit: Payer: Self-pay | Admitting: Cardiovascular Disease

## 2015-02-21 DIAGNOSIS — M79604 Pain in right leg: Secondary | ICD-10-CM | POA: Insufficient documentation

## 2015-02-21 DIAGNOSIS — M79605 Pain in left leg: Secondary | ICD-10-CM

## 2015-02-21 NOTE — Assessment & Plan Note (Signed)
The patient is status post CABG with recent worsening of exertional dyspnea. Thus, I recommend evaluation with a pharmacologic nuclear stress test. Otherwise continue aggressive medical therapy.

## 2015-02-21 NOTE — Assessment & Plan Note (Signed)
He is at risk for peripheral arterial disease with multiple risk factors. Thus, I requested lower extremity arterial Doppler.

## 2015-02-21 NOTE — Assessment & Plan Note (Signed)
Continue small dose lisinopril and carvedilol.

## 2015-02-21 NOTE — Assessment & Plan Note (Signed)
Lab Results  Component Value Date   CHOL 97 07/07/2013   HDL 35.20* 07/07/2013   LDLCALC 46 07/07/2013   TRIG 77.0 07/07/2013   CHOLHDL 3 07/07/2013   Continue treatment with atorvastatin with a target LDL of less than 70.

## 2015-02-23 ENCOUNTER — Telehealth (HOSPITAL_COMMUNITY): Payer: Self-pay

## 2015-02-23 NOTE — Telephone Encounter (Signed)
Patient given detailed instructions per Myocardial Perfusion Study Information Sheet for the test on 02-25-2015 at 0745. Patient notified to arrive 15 minutes early and that it is imperative to arrive on time for appointment to keep from having the test rescheduled. Patient aware of PV appointment at [facility] at 1230.  If you need to cancel or reschedule your appointment, please call the office within 24 hours of your appointment. Failure to do so may result in a cancellation of your appointment, and a $50 no show fee. Patient verbalized understanding.Odis Hollingshead, RN

## 2015-02-25 ENCOUNTER — Other Ambulatory Visit: Payer: Self-pay | Admitting: Cardiovascular Disease

## 2015-02-25 ENCOUNTER — Ambulatory Visit (HOSPITAL_COMMUNITY)
Admission: RE | Admit: 2015-02-25 | Discharge: 2015-02-25 | Disposition: A | Discharge status: Home / Self Care | Payer: Medicare Other | Source: Ambulatory Visit | Attending: Cardiovascular Disease | Admitting: Cardiovascular Disease

## 2015-02-25 ENCOUNTER — Ambulatory Visit (HOSPITAL_BASED_OUTPATIENT_CLINIC_OR_DEPARTMENT_OTHER): Payer: Medicare Other

## 2015-02-25 DIAGNOSIS — E785 Hyperlipidemia, unspecified: Secondary | ICD-10-CM | POA: Insufficient documentation

## 2015-02-25 DIAGNOSIS — R0602 Shortness of breath: Secondary | ICD-10-CM | POA: Diagnosis not present

## 2015-02-25 DIAGNOSIS — I739 Peripheral vascular disease, unspecified: Secondary | ICD-10-CM | POA: Diagnosis not present

## 2015-02-25 DIAGNOSIS — R0609 Other forms of dyspnea: Secondary | ICD-10-CM | POA: Diagnosis not present

## 2015-02-25 DIAGNOSIS — E119 Type 2 diabetes mellitus without complications: Secondary | ICD-10-CM | POA: Diagnosis not present

## 2015-02-25 DIAGNOSIS — I70201 Unspecified atherosclerosis of native arteries of extremities, right leg: Secondary | ICD-10-CM | POA: Diagnosis not present

## 2015-02-25 DIAGNOSIS — R9439 Abnormal result of other cardiovascular function study: Secondary | ICD-10-CM | POA: Insufficient documentation

## 2015-02-25 LAB — MYOCARDIAL PERFUSION IMAGING
CHL CUP NUCLEAR SDS: 4
CHL CUP NUCLEAR SRS: 8
CHL CUP RESTING HR STRESS: 73 {beats}/min
CSEPPHR: 92 {beats}/min
LV sys vol: 48 mL
LVDIAVOL: 100 mL
RATE: 0.29
SSS: 12
TID: 1.04

## 2015-02-25 MED ORDER — REGADENOSON 0.4 MG/5ML IV SOLN
0.4000 mg | Freq: Once | INTRAVENOUS | Status: AC
  Administered 2015-02-25: 0.4 mg via INTRAVENOUS

## 2015-02-25 MED ORDER — TECHNETIUM TC 99M SESTAMIBI GENERIC - CARDIOLITE
10.8000 | Freq: Once | INTRAVENOUS | Status: AC | PRN
  Administered 2015-02-25: 11 via INTRAVENOUS

## 2015-02-25 MED ORDER — TECHNETIUM TC 99M SESTAMIBI GENERIC - CARDIOLITE
32.1000 | Freq: Once | INTRAVENOUS | Status: AC | PRN
  Administered 2015-02-25: 32.1 via INTRAVENOUS

## 2015-05-25 ENCOUNTER — Ambulatory Visit (INDEPENDENT_AMBULATORY_CARE_PROVIDER_SITE_OTHER): Payer: Medicare Other | Admitting: Cardiovascular Disease

## 2015-05-25 ENCOUNTER — Encounter: Payer: Self-pay | Admitting: Cardiovascular Disease

## 2015-05-25 VITALS — BP 110/58 | HR 85 | Ht 72.0 in | Wt 179.1 lb

## 2015-05-25 DIAGNOSIS — I739 Peripheral vascular disease, unspecified: Secondary | ICD-10-CM | POA: Diagnosis not present

## 2015-05-25 NOTE — Patient Instructions (Signed)

## 2015-05-28 NOTE — Progress Notes (Signed)
Cardiology Office Note   Date:  05/28/2015   ID:  Trevor [name], DOB 1945-11-17, MRN [id number]  PCP:  Stephens Shire, MD  Cardiologist:   [name], MD   No chief complaint on file.     History of Present Illness: Trevor [name] is a 70 y.o. male who presents for a followup visit regarding coronary artery disease status post  CABG and ischemic cardiomyopathy. He presented in April of 2014 with anterior ST elevation myocardial infarction and was found to have significant three-vessel coronary artery disease and moderate left main stenosis. The patient underwent CABG at that time.  Echocardiogram in July of 2014 showed improved LV systolic function with an ejection fraction of 45-50% with apical hypokinesis. He denies any chest pain.  He underwent a nuclear stress test in December 2016 due to worsening exertional dyspnea. The test showed fixed inferior wall defect without significant ischemia. Ejection fraction was 53%. Overall it was a low risk study.  He complained of bilateral hip and back pain with walking as well as calf claudication. He underwent noninvasive vascular evaluation which showed an ABI of 0.69 on the right and 1.1 on the left. Duplex showed severe right mid SFA stenosis with a peak velocity of 653. The patient continues to complain of bilateral leg pain but mostly right calf pain actually with walking. He reports that he had the symptoms for years.    Past Medical History  Diagnosis Date  . Coronary artery disease     06/2012: Anterior ST elevation myocardial infarction. Cardiac catheterization showed significant three-vessel coronary artery disease, ejection fraction 35-40%. The patient underwent CABG with LIMA to LAD, SVG to ramus and sequential SVG to right PDA/PL  . Ischemic cardiomyopathy     Ejection fraction of 35-40%  . Hyperlipidemia   . Diabetes mellitus without complication [facility])     Past Surgical History  Procedure Laterality Date  . Coronary  artery bypass graft N/A 07/05/2012    Procedure: CORONARY ARTERY BYPASS GRAFTING (CABG);  Surgeon: Ivin Poot, MD;  Location: [place];  Service: Open Heart Surgery;  Laterality: N/A;  . Intraoperative transesophageal echocardiogram N/A 07/05/2012    Procedure: INTRAOPERATIVE TRANSESOPHAGEAL ECHOCARDIOGRAM;  Surgeon: Ivin Poot, MD;  Location: [place];  Service: Open Heart Surgery;  Laterality: N/A;  . Left heart catheterization with coronary angiogram N/A 07/04/2012    Procedure: LEFT HEART CATHETERIZATION WITH CORONARY ANGIOGRAM;  Surgeon: Wellington Hampshire, MD;  Location: [place] CATH [facility];  Service: Cardiovascular;  Laterality: N/A;     Current Outpatient Prescriptions  Medication Sig Dispense Refill  . aspirin EC 325 MG EC tablet Take 1 tablet (325 mg total) by mouth daily. 30 tablet   . atorvastatin (LIPITOR) 40 MG tablet Take 1 tablet (40 mg total) by mouth daily. 30 tablet 6  . carvedilol (COREG) 3.125 MG tablet TAKE ONE TABLET BY MOUTH TWICE DAILY 180 tablet 3  . lisinopril (PRINIVIL,ZESTRIL) 5 MG tablet TAKE ONE TABLET BY MOUTH DAILY 30 tablet 3  . metFORMIN (GLUCOPHAGE) 500 MG tablet Take 1,000 mg by mouth 2 (two) times daily with a meal. Take 1 tablet twice a day     No current facility-administered medications for this visit.    Allergies:   Review of patient's allergies indicates no known allergies.    Social History:  The patient  reports that he quit smoking about 3 years ago. He has never used smokeless tobacco. He reports that he does not drink alcohol or use  illicit drugs.   Family History:  The patient's family history includes Heart attack in his brother.    ROS:  Please see the history of present illness.   Otherwise, review of systems are positive for none.   All other systems are reviewed and negative.    PHYSICAL EXAM: VS:  BP 110/58 mmHg  Pulse 85  Ht 6' (1.829 m)  Wt 179 lb 1.6 oz (81.239 kg)  BMI 24.28 kg/m2  SpO2 99% , BMI Body mass index is 24.28  kg/(m^2). GEN: Well nourished, well developed, in no acute distress HEENT: normal Neck: no JVD, carotid bruits, or masses Cardiac: RRR; no murmurs, rubs, or gallops,no edema  Respiratory:  clear to auscultation bilaterally, normal work of breathing GI: soft, nontender, nondistended, + BS MS: no deformity or atrophy Skin: warm and dry, no rash Neuro:  Strength and sensation are intact Psych: euthymic mood, full affect   EKG:  EKG is not ordered today.    Recent Labs: No results found for requested labs within last 365 days.    Lipid Panel    Component Value Date/Time   CHOL 97 07/07/2013 0930   TRIG 77.0 07/07/2013 0930   HDL 35.20* 07/07/2013 0930   CHOLHDL 3 07/07/2013 0930   VLDL 15.4 07/07/2013 0930   LDLCALC 46 07/07/2013 0930      Wt Readings from Last 3 Encounters:  05/25/15 179 lb 1.6 oz (81.239 kg)  02/25/15 191 lb (86.637 kg)  02/16/15 191 lb 12.8 oz (87 kg)         ASSESSMENT AND PLAN:  1.  Coronary artery disease: Status post CABG. He has stable exertional dyspnea. Nuclear stress test in December was overall low risk. I recommend continuing medical therapy. Ejection fraction was 53%.  2. Ischemic cardiomyopathy: Most recent ejection fraction was 53%. He has no evidence of volume overload. Continue small dose carvedilol and lisinopril.  3. Peripheral arterial disease: The patient has evidence of severe right mid SFA stenosis and does have claudication. However, he does not feel that this is limiting his activities of daily living and wants to continue with medical therapy.  4. Hyperlipidemia: Continue treatment with atorvastatin with a target LDL of less than 70.      Disposition:   FU with me in 6 months  Signed,  [name], MD  05/28/2015 7:03 PM    [place]

## 2015-07-12 ENCOUNTER — Other Ambulatory Visit: Payer: Self-pay | Admitting: Cardiovascular Disease

## 2015-07-12 NOTE — Telephone Encounter (Signed)
Rx(s) sent to pharmacy electronically.  

## 2015-07-12 NOTE — Telephone Encounter (Signed)
Please review for refill, Thank you. 

## 2015-08-24 ENCOUNTER — Other Ambulatory Visit: Payer: Self-pay | Admitting: *Deleted

## 2015-08-24 ENCOUNTER — Telehealth: Payer: Self-pay | Admitting: Cardiovascular Disease

## 2015-08-24 MED ORDER — CARVEDILOL 3.125 MG PO TABS: 3.1250 mg | ORAL_TABLET | Freq: Two times a day (BID) | ORAL | Status: DC

## 2015-08-24 MED ORDER — LISINOPRIL 5 MG PO TABS: 5.0000 mg | ORAL_TABLET | Freq: Every day | ORAL | Status: DC

## 2015-08-24 NOTE — Telephone Encounter (Signed)
°*  STAT* If patient is at the pharmacy, call can be transferred to refill team.   1. Which medications need to be refilled? (please list name of each medication and dose if known) Lisinopril and Carvedilol-new prescriptions-changing pharmacy  2. Which pharmacy/location (including street and city if local pharmacy) is medication to be sent to?[place] and [place]  3. Do they need a 30 day or 90 day supply? 90 and refills

## 2015-09-07 ENCOUNTER — Telehealth: Payer: Self-pay | Admitting: Hematology and Oncology

## 2015-09-07 ENCOUNTER — Encounter: Payer: Self-pay | Admitting: Hematology and Oncology

## 2015-09-07 NOTE — Telephone Encounter (Signed)
Appointment scheduled with Alvy Bimler 7/3 at 1130.Demographics verified. Letter to referring and patient.

## 2015-09-20 ENCOUNTER — Other Ambulatory Visit (HOSPITAL_BASED_OUTPATIENT_CLINIC_OR_DEPARTMENT_OTHER): Payer: Medicare Other

## 2015-09-20 ENCOUNTER — Encounter: Payer: Self-pay | Admitting: Hematology and Oncology

## 2015-09-20 ENCOUNTER — Ambulatory Visit: Payer: Medicare Other | Admitting: Hematology and Oncology

## 2015-09-20 ENCOUNTER — Ambulatory Visit (HOSPITAL_BASED_OUTPATIENT_CLINIC_OR_DEPARTMENT_OTHER): Payer: Medicare Other | Admitting: Hematology and Oncology

## 2015-09-20 VITALS — BP 118/71 | HR 95 | Temp 98.2°F | Resp 19 | Wt 178.3 lb

## 2015-09-20 DIAGNOSIS — D500 Iron deficiency anemia secondary to blood loss (chronic): Secondary | ICD-10-CM

## 2015-09-20 DIAGNOSIS — R634 Abnormal weight loss: Secondary | ICD-10-CM

## 2015-09-20 DIAGNOSIS — I255 Ischemic cardiomyopathy: Secondary | ICD-10-CM

## 2015-09-20 LAB — CBC & DIFF AND RETIC
BASO%: 0.2 % (ref 0.0–2.0)
Basophils Absolute: 0 10*3/uL (ref 0.0–0.1)
EOS%: 4.1 % (ref 0.0–7.0)
Eosinophils Absolute: 0.5 10*3/uL (ref 0.0–0.5)
HCT: 25.8 % — ABNORMAL LOW (ref 38.4–49.9)
HGB: 7.8 g/dL — ABNORMAL LOW (ref 13.0–17.1)
Immature Retic Fract: 16.7 % — ABNORMAL HIGH (ref 3.00–10.60)
LYMPH#: 1.2 10*3/uL (ref 0.9–3.3)
LYMPH%: 10.9 % — ABNORMAL LOW (ref 14.0–49.0)
MCH: 23.2 pg — AB (ref 27.2–33.4)
MCHC: 30.2 g/dL — AB (ref 32.0–36.0)
MCV: 76.8 fL — ABNORMAL LOW (ref 79.3–98.0)
MONO#: 0.7 10*3/uL (ref 0.1–0.9)
MONO%: 6.1 % (ref 0.0–14.0)
NEUT#: 8.7 10*3/uL — ABNORMAL HIGH (ref 1.5–6.5)
NEUT%: 78.7 % — AB (ref 39.0–75.0)
PLATELETS: 358 10*3/uL (ref 140–400)
RBC: 3.36 10*6/uL — AB (ref 4.20–5.82)
RDW: 17.2 % — AB (ref 11.0–14.6)
RETIC %: 1.7 % (ref 0.80–1.80)
RETIC CT ABS: 57.12 10*3/uL (ref 34.80–93.90)
WBC: 11.1 10*3/uL — ABNORMAL HIGH (ref 4.0–10.3)

## 2015-09-20 LAB — FERRITIN: Ferritin: 25 ng/ml (ref 22–316)

## 2015-09-20 NOTE — Assessment & Plan Note (Signed)
He had recent weight loss which he claimed was intentional due to healthy dietary choice. We discussed concern for possible undiagnosed malignancy in view of recent anemia and history of smoking. We discussed imaging study or GI workup but the patient declined for now

## 2015-09-20 NOTE — Assessment & Plan Note (Signed)
The patient had history of ischemic cardiomyopathy and is on aspirin therapy for prevention. He is asymptomatic. I will consult with his cardiologist to see we can reduce the dose of aspirin therapy to 162 mg instead of 325 mg

## 2015-09-20 NOTE — Assessment & Plan Note (Signed)
The most likely cause of his anemia is due to chronic blood loss/malabsorption syndrome. We discussed some of the risks, benefits, and alternatives of intravenous iron infusions. The patient is symptomatic from anemia and the iron level is critically low. He tolerated oral iron supplement poorly and desires to achieved higher levels of iron faster for adequate hematopoesis. Some of the side-effects to be expected including risks of infusion reactions, phlebitis, headaches, nausea and fatigue.  The patient is willing to proceed. Patient education material was dispensed.  Goal is to keep ferritin level greater than 50 I discussed the importance of further GI workup but the patient declined. I will consult with his cardiologist to see we can reduce the dose of aspirin therapy which I suspect is the cause of his chronic blood loss. I will proceed with IV iron next week and then recheck blood work and see him back within the month.

## 2015-09-20 NOTE — Progress Notes (Signed)
[facility] CONSULT NOTE  Patient Care Team: Antony Blackbird, MD as PCP - General (Family Medicine) Antony Blackbird, MD (Family Medicine)  CHIEF COMPLAINTS/PURPOSE OF CONSULTATION:  Iron deficiency anemia  HISTORY OF PRESENTING ILLNESS:  Trevor [name] 70 y.o. male is here because of severe iron deficiency anemia  He was found to have abnormal CBC from routine blood work monitoring. His baseline hemoglobin was normal in 2014. I review his electronic records. Over the past year since July 2016, he has progressive microcytic anemia. He denies recent chest pain on exertion, shortness of breath on minimal exertion, pre-syncopal episodes, or palpitations. He really has sensation of dizziness He had not noticed any recent bleeding such as epistaxis, hematuria or hematochezia The patient denies over the counter NSAID ingestion. He is on chronic antiplatelets agents. He had never had screening colonoscopy. Apparently he has fecal occult blood testing done to his primary care office which according to him were within normal limits He had no prior history or diagnosis of cancer. His age appropriate screening programs are up-to-date apart from colonoscopy not being done He denies any pica and eats a variety of diet. He had donated blood in the past but has never received blood transfusion The patient was prescribed oral iron supplements and he takes 1 daily in the morning after meal for the past month. He denies side effects from treatment The patient was a heavy smoker but quit several years ago. He had intentional weight loss of 40 pounds over the past 6 months since he moved in to stay with his sister's family to help her with taking care of her husband with Alzheimer's. The patient felt that he is eating healthier with regular meals and hence the recent weight loss  MEDICAL HISTORY:  Past Medical History  Diagnosis Date  . Coronary artery disease     06/2012: Anterior ST elevation  myocardial infarction. Cardiac catheterization showed significant three-vessel coronary artery disease, ejection fraction 35-40%. The patient underwent CABG with LIMA to LAD, SVG to ramus and sequential SVG to right PDA/PL  . Ischemic cardiomyopathy     Ejection fraction of 35-40%  . Hyperlipidemia   . Diabetes mellitus without complication ([place])     SURGICAL HISTORY: Past Surgical History  Procedure Laterality Date  . Coronary artery bypass graft N/A 07/05/2012    Procedure: CORONARY ARTERY BYPASS GRAFTING (CABG);  Surgeon: Ivin Poot, MD;  Location: [place];  Service: Open Heart Surgery;  Laterality: N/A;  . Intraoperative transesophageal echocardiogram N/A 07/05/2012    Procedure: INTRAOPERATIVE TRANSESOPHAGEAL ECHOCARDIOGRAM;  Surgeon: Ivin Poot, MD;  Location: [place];  Service: Open Heart Surgery;  Laterality: N/A;  . Left heart catheterization with coronary angiogram N/A 07/04/2012    Procedure: LEFT HEART CATHETERIZATION WITH CORONARY ANGIOGRAM;  Surgeon: Wellington Hampshire, MD;  Location: [place] CATH LAB;  Service: Cardiovascular;  Laterality: N/A;    SOCIAL HISTORY: Social History   Social History  . Marital Status: Widowed    Spouse Name: N/A  . Number of Children: N/A  . Years of Education: N/A   Occupational History  . Retired Psychologist, educational    Social History Main Topics  . Smoking status: Former Smoker -- 1.00 packs/day for 50 years    Quit date: 04/20/2012  . Smokeless tobacco: Never Used  . Alcohol Use: No  . Drug Use: No  . Sexual Activity: Not on file     Comment: retired, lives with sister and husband   Other Topics Concern  .  Not on file   Social History Narrative   Lives alone.    FAMILY HISTORY: Family History  Problem Relation Age of Onset  . Heart attack Brother   . Cancer Mother     leukemia  . Cancer Maternal Grandfather     possible cancer    ALLERGIES:  has No Known Allergies.  MEDICATIONS:  Current Outpatient Prescriptions   Medication Sig Dispense Refill  . aspirin EC 325 MG EC tablet Take 1 tablet (325 mg total) by mouth daily. 30 tablet   . atorvastatin (LIPITOR) 40 MG tablet Take 1 tablet (40 mg total) by mouth daily. 30 tablet 6  . carvedilol (COREG) 3.125 MG tablet Take 1 tablet (3.125 mg total) by mouth 2 (two) times daily. 180 tablet 2  . lisinopril (PRINIVIL,ZESTRIL) 5 MG tablet Take 1 tablet (5 mg total) by mouth daily. 90 tablet 2  . metFORMIN (GLUCOPHAGE) 500 MG tablet Take 1,000 mg by mouth 2 (two) times daily with a meal. Take 1 tablet twice a day     No current facility-administered medications for this visit.    REVIEW OF SYSTEMS:   Constitutional: Denies fevers, chills or abnormal night sweats Eyes: Denies blurriness of vision, double vision or watery eyes Ears, nose, mouth, throat, and face: Denies mucositis or sore throat Respiratory: Denies cough, dyspnea or wheezes Cardiovascular: Denies palpitation, chest discomfort or lower extremity swelling Gastrointestinal:  Denies nausea, heartburn or change in bowel habits Skin: Denies abnormal skin rashes Lymphatics: Denies new lymphadenopathy or easy bruising Neurological:Denies numbness, tingling or new weaknesses Behavioral/Psych: Mood is stable, no new changes  All other systems were reviewed with the patient and are negative.  PHYSICAL EXAMINATION: ECOG PERFORMANCE STATUS: 1 - Symptomatic but completely ambulatory  Filed Vitals:   09/20/15 1133  BP: 118/71  Pulse: 95  Temp: 98.2 F (36.8 C)  Resp: 19   Filed Weights   09/20/15 1133  Weight: 178 lb 4.8 oz (80.876 kg)    GENERAL:alert, no distress and comfortable SKIN: skin color is pale, texture, turgor are normal, no rashes or significant lesions EYES: normal, conjunctiva are pale and non-injected, sclera clear OROPHARYNX:no exudate, no erythema and lips, buccal mucosa, and tongue normal  NECK: supple, thyroid normal size, non-tender, without nodularity LYMPH:  no palpable  lymphadenopathy in the cervical, axillary or inguinal LUNGS: clear to auscultation and percussion with normal breathing effort HEART: regular rate & rhythm and no murmurs and no lower extremity edema ABDOMEN:abdomen soft, non-tender and normal bowel sounds Musculoskeletal:no cyanosis of digits and no clubbing  PSYCH: alert & oriented x 3 with fluent speech NEURO: no focal motor/sensory deficits  ASSESSMENT & PLAN:   Iron deficiency anemia due to chronic blood loss The most likely cause of his anemia is due to chronic blood loss/malabsorption syndrome. We discussed some of the risks, benefits, and alternatives of intravenous iron infusions. The patient is symptomatic from anemia and the iron level is critically low. He tolerated oral iron supplement poorly and desires to achieved higher levels of iron faster for adequate hematopoesis. Some of the side-effects to be expected including risks of infusion reactions, phlebitis, headaches, nausea and fatigue.  The patient is willing to proceed. Patient education material was dispensed.  Goal is to keep ferritin level greater than 50 I discussed the importance of further GI workup but the patient declined. I will consult with his cardiologist to see we can reduce the dose of aspirin therapy which I suspect is the cause of his chronic  blood loss. I will proceed with IV iron next week and then recheck blood work and see him back within the month.  Ischemic cardiomyopathy The patient had history of ischemic cardiomyopathy and is on aspirin therapy for prevention. He is asymptomatic. I will consult with his cardiologist to see we can reduce the dose of aspirin therapy to 162 mg instead of 325 mg  Recent weight loss He had recent weight loss which he claimed was intentional due to healthy dietary choice. We discussed concern for possible undiagnosed malignancy in view of recent anemia and history of smoking. We discussed imaging study or GI workup but  the patient declined for now   Orders Placed This Encounter  Procedures  . CBC & Diff and Retic    Standing Status: Standing     Number of Occurrences: 9     Standing Expiration Date: 09/19/2016  . Ferritin    Standing Status: Standing     Number of Occurrences: 9     Standing Expiration Date: 09/19/2016     All questions were answered. The patient knows to call the clinic with any problems, questions or concerns. I spent 40 minutes counseling the patient face to face. The total time spent in the appointment was 55 minutes and more than 50% was on counseling.     [facility], Kyle, MD 09/20/2015 3:03 PM

## 2015-09-22 ENCOUNTER — Other Ambulatory Visit: Payer: Self-pay | Admitting: Hematology and Oncology

## 2015-09-24 ENCOUNTER — Telehealth: Payer: Self-pay | Admitting: *Deleted

## 2015-09-24 ENCOUNTER — Telehealth: Payer: Self-pay | Admitting: Cardiovascular Disease

## 2015-09-24 MED ORDER — ASPIRIN 81 MG PO TBEC: 81.0000 mg | DELAYED_RELEASE_TABLET | Freq: Every day | ORAL | Status: DC

## 2015-09-24 NOTE — Telephone Encounter (Signed)
New message   Tammy verbalized that Dr. Alvy Bimler is trying to reach Dr.Arida  Can Dr. Fletcher Anon reduce pt Asprin 325mg  to 81mg  pt has iron defiency and enema with suspected GI bleed

## 2015-09-24 NOTE — Telephone Encounter (Signed)
OK to decrease ASA to 81 mg daily per Dr Fletcher Anon and Dr Alvy Bimler. To call if has any questions.

## 2015-09-24 NOTE — Telephone Encounter (Signed)
Aspirin can be decreased to 81 mg once daily.

## 2015-09-24 NOTE — Telephone Encounter (Signed)
-----   Message from Heath Lark, MD sent at 09/24/2015  7:46 AM EDT ----- Regarding: consult CV He was seen by Dr. Fletcher Anon. I sent a msg to him but he never replied to my message. Can you call his office and ask if the patient can reduce his aspirin from 325 mg to 81 mg? Patient has iron deficiency anemia with suspect GI bleed

## 2015-09-24 NOTE — Telephone Encounter (Signed)
Message sent to [place] for advice.

## 2015-09-24 NOTE — Telephone Encounter (Signed)
I spoke with Tammy at Dr Alvy Bimler office and they are aware that the pt can decrease ASA to 81mg  daily. Medication list updated.

## 2015-09-28 ENCOUNTER — Ambulatory Visit (HOSPITAL_BASED_OUTPATIENT_CLINIC_OR_DEPARTMENT_OTHER): Payer: Medicare Other

## 2015-09-28 VITALS — BP 94/52 | HR 80 | Temp 98.7°F | Resp 16

## 2015-09-28 DIAGNOSIS — D500 Iron deficiency anemia secondary to blood loss (chronic): Secondary | ICD-10-CM | POA: Diagnosis not present

## 2015-09-28 MED ORDER — FERUMOXYTOL INJECTION 510 MG/17 ML
510.0000 mg | Freq: Once | INTRAVENOUS | Status: AC
  Administered 2015-09-28: 510 mg via INTRAVENOUS
  Filled 2015-09-28: qty 17

## 2015-09-28 MED ORDER — SODIUM CHLORIDE 0.9 % IV SOLN
Freq: Once | INTRAVENOUS | Status: AC
  Administered 2015-09-28: 13:00:00 via INTRAVENOUS

## 2015-09-28 NOTE — Patient Instructions (Signed)

## 2015-10-05 ENCOUNTER — Ambulatory Visit (HOSPITAL_BASED_OUTPATIENT_CLINIC_OR_DEPARTMENT_OTHER): Payer: Medicare Other

## 2015-10-05 VITALS — BP 109/71 | HR 95 | Temp 97.7°F | Resp 16

## 2015-10-05 DIAGNOSIS — D500 Iron deficiency anemia secondary to blood loss (chronic): Secondary | ICD-10-CM | POA: Diagnosis not present

## 2015-10-05 MED ORDER — SODIUM CHLORIDE 0.9 % IV SOLN
Freq: Once | INTRAVENOUS | Status: AC
  Administered 2015-10-05: 12:00:00 via INTRAVENOUS

## 2015-10-05 MED ORDER — SODIUM CHLORIDE 0.9 % IV SOLN
510.0000 mg | Freq: Once | INTRAVENOUS | Status: AC
  Administered 2015-10-05: 510 mg via INTRAVENOUS
  Filled 2015-10-05: qty 17

## 2015-10-05 NOTE — Patient Instructions (Signed)

## 2015-11-02 ENCOUNTER — Other Ambulatory Visit (HOSPITAL_BASED_OUTPATIENT_CLINIC_OR_DEPARTMENT_OTHER): Payer: Medicare Other

## 2015-11-02 DIAGNOSIS — D500 Iron deficiency anemia secondary to blood loss (chronic): Secondary | ICD-10-CM

## 2015-11-02 LAB — CBC & DIFF AND RETIC
BASO%: 0.2 % (ref 0.0–2.0)
BASOS ABS: 0 10*3/uL (ref 0.0–0.1)
EOS%: 3.2 % (ref 0.0–7.0)
Eosinophils Absolute: 0.3 10*3/uL (ref 0.0–0.5)
HEMATOCRIT: 32.3 % — AB (ref 38.4–49.9)
HGB: 9.9 g/dL — ABNORMAL LOW (ref 13.0–17.1)
IMMATURE RETIC FRACT: 9.7 % (ref 3.00–10.60)
LYMPH#: 1.3 10*3/uL (ref 0.9–3.3)
LYMPH%: 14.4 % (ref 14.0–49.0)
MCH: 25.6 pg — ABNORMAL LOW (ref 27.2–33.4)
MCHC: 30.7 g/dL — ABNORMAL LOW (ref 32.0–36.0)
MCV: 83.7 fL (ref 79.3–98.0)
MONO#: 0.7 10*3/uL (ref 0.1–0.9)
MONO%: 7.3 % (ref 0.0–14.0)
NEUT#: 6.9 10*3/uL — ABNORMAL HIGH (ref 1.5–6.5)
NEUT%: 74.9 % (ref 39.0–75.0)
PLATELETS: 278 10*3/uL (ref 140–400)
RBC: 3.86 10*6/uL — AB (ref 4.20–5.82)
RDW: 21.4 % — AB (ref 11.0–14.6)
RETIC %: 1.1 % (ref 0.80–1.80)
RETIC CT ABS: 42.46 10*3/uL (ref 34.80–93.90)
WBC: 9.2 10*3/uL (ref 4.0–10.3)

## 2015-11-02 LAB — FERRITIN: Ferritin: 277 ng/ml (ref 22–316)

## 2015-11-09 ENCOUNTER — Ambulatory Visit (HOSPITAL_BASED_OUTPATIENT_CLINIC_OR_DEPARTMENT_OTHER): Payer: Medicare Other | Admitting: Hematology and Oncology

## 2015-11-09 ENCOUNTER — Ambulatory Visit: Payer: Medicare Other

## 2015-11-09 ENCOUNTER — Encounter: Payer: Self-pay | Admitting: Hematology and Oncology

## 2015-11-09 DIAGNOSIS — R634 Abnormal weight loss: Secondary | ICD-10-CM

## 2015-11-09 DIAGNOSIS — D500 Iron deficiency anemia secondary to blood loss (chronic): Secondary | ICD-10-CM | POA: Diagnosis not present

## 2015-11-09 NOTE — Assessment & Plan Note (Signed)
The patient continues to have progressive weight loss since I saw him. I recommend GI workup but he declined. I recommend close follow-up with primary care doctor. The patient stated he has an appointment pending to see his doctor next month

## 2015-11-09 NOTE — Assessment & Plan Note (Signed)
The patient has multifactorial anemia. Despite iron replacement therapy, the anemia did not correct all the way to normal, suggestive of second component of anemia of chronic illness. I recommend EGD and colonoscopy to find out the cause of iron deficiency anemia, evaluate the cause of recent weight loss and to exclude malignancy. If GI workup is negative, one can consider other workup including CT imaging study or bone marrow biopsy. The patient has significant improvement of symptoms and is reluctant to pursue additional workup. I recommend he discuss this with his primary care doctor next month  I recommend repeat blood work in iron studies in the next 2-3 months to make sure that his iron deficiency anemia does not recur

## 2015-11-09 NOTE — Progress Notes (Signed)
[facility] OFFICE PROGRESS NOTE  FULP, CAMMIE, MD SUMMARY OF HEMATOLOGIC HISTORY:  Trevor [name] 70 y.o. male is here because of severe iron deficiency anemia  He was found to have abnormal CBC from routine blood work monitoring. His baseline hemoglobin was normal in 2014. I review his electronic records. Over the past year since July 2016, he has progressive microcytic anemia. He denies recent chest pain on exertion, shortness of breath on minimal exertion, pre-syncopal episodes, or palpitations. He really has sensation of dizziness He had not noticed any recent bleeding such as epistaxis, hematuria or hematochezia The patient denies over the counter NSAID ingestion. He is on chronic antiplatelets agents. He had never had screening colonoscopy. Apparently he has fecal occult blood testing done to his primary care office which according to him were within normal limits He had no prior history or diagnosis of cancer. His age appropriate screening programs are up-to-date apart from colonoscopy not being done He denies any pica and eats a variety of diet. He had donated blood in the past but has never received blood transfusion The patient was prescribed oral iron supplements and he takes 1 daily in the morning after meal for the past month. He denies side effects from treatment The patient was a heavy smoker but quit several years ago. He had intentional weight loss of 40 pounds over the past 6 months since he moved in to stay with his sister's family to help her with taking care of her husband with Alzheimer's. The patient felt that he is eating healthier with regular meals and hence the recent weight loss In July 2017, he was found to have severe iron deficiency anemia He received 2 doses of IV iron on 09/28/2015 and 10/05/2015  INTERVAL HISTORY: Trevor [name] 70 y.o. male returns for follow-up. He denies symptoms of fatigue. Denies chest pain or shortness of breath. The  patient denies any recent signs or symptoms of bleeding such as spontaneous epistaxis, hematuria or hematochezia. He continues to have progressive weight loss of unknown etiology  I have reviewed the past medical history, past surgical history, social history and family history with the patient and they are unchanged from previous note.  ALLERGIES:  has No Known Allergies.  MEDICATIONS:  Current Outpatient Prescriptions  Medication Sig Dispense Refill  . aspirin 81 MG EC tablet Take 1 tablet (81 mg total) by mouth daily.    Marland Kitchen atorvastatin (LIPITOR) 40 MG tablet Take 1 tablet (40 mg total) by mouth daily. 30 tablet 6  . carvedilol (COREG) 3.125 MG tablet Take 1 tablet (3.125 mg total) by mouth 2 (two) times daily. 180 tablet 2  . lisinopril (PRINIVIL,ZESTRIL) 5 MG tablet Take 1 tablet (5 mg total) by mouth daily. 90 tablet 2  . metFORMIN (GLUCOPHAGE) 500 MG tablet Take 1,000 mg by mouth 2 (two) times daily with a meal. Take 1 tablet twice a day     No current facility-administered medications for this visit.      REVIEW OF SYSTEMS:   Constitutional: Denies fevers, chills or night sweats Eyes: Denies blurriness of vision Ears, nose, mouth, throat, and face: Denies mucositis or sore throat Respiratory: Denies cough, dyspnea or wheezes Cardiovascular: Denies palpitation, chest discomfort or lower extremity swelling Gastrointestinal:  Denies nausea, heartburn or change in bowel habits Skin: Denies abnormal skin rashes Lymphatics: Denies new lymphadenopathy or easy bruising Neurological:Denies numbness, tingling or new weaknesses Behavioral/Psych: Mood is stable, no new changes  All other systems were reviewed with the  patient and are negative.  PHYSICAL EXAMINATION: ECOG PERFORMANCE STATUS: 0 - Asymptomatic  Vitals:   11/09/15 1153  BP: 112/69  Pulse: 91  Resp: 18  Temp: 98.2 F (36.8 C)   Filed Weights   11/09/15 1153  Weight: 174 lb 11.2 oz (79.2 kg)    GENERAL:alert, no  distress and comfortable SKIN: skin color, texture, turgor are normal, no rashes or significant lesions EYES: normal, Conjunctiva are pink and non-injected, sclera clear Musculoskeletal:no cyanosis of digits and no clubbing  NEURO: alert & oriented x 3 with fluent speech, no focal motor/sensory deficits  LABORATORY DATA:  I have reviewed the data as listed     Component Value Date/Time   NA 136 05/28/2013 0951   K 4.3 05/28/2013 0951   CL 100 05/28/2013 0951   CO2 28 05/28/2013 0951   GLUCOSE 156 (H) 05/28/2013 0951   BUN 15 05/28/2013 0951   CREATININE 0.9 05/28/2013 0951   CALCIUM 10.1 05/28/2013 0951   PROT 7.3 07/07/2013 0930   ALBUMIN 4.0 07/07/2013 0930   AST 20 07/07/2013 0930   ALT 20 07/07/2013 0930   ALKPHOS 92 07/07/2013 0930   BILITOT 1.0 07/07/2013 0930   GFRNONAA 88 (L) 07/09/2012 0540   GFRAA >90 07/09/2012 0540    No results found for: SPEP, UPEP  Lab Results  Component Value Date   WBC 9.2 11/02/2015   NEUTROABS 6.9 (H) 11/02/2015   HGB 9.9 (L) 11/02/2015   HCT 32.3 (L) 11/02/2015   MCV 83.7 11/02/2015   PLT 278 11/02/2015      Chemistry      Component Value Date/Time   NA 136 05/28/2013 0951   K 4.3 05/28/2013 0951   CL 100 05/28/2013 0951   CO2 28 05/28/2013 0951   BUN 15 05/28/2013 0951   CREATININE 0.9 05/28/2013 0951      Component Value Date/Time   CALCIUM 10.1 05/28/2013 0951   ALKPHOS 92 07/07/2013 0930   AST 20 07/07/2013 0930   ALT 20 07/07/2013 0930   BILITOT 1.0 07/07/2013 0930      ASSESSMENT & PLAN:  Iron deficiency anemia due to chronic blood loss The patient has multifactorial anemia. Despite iron replacement therapy, the anemia did not correct all the way to normal, suggestive of second component of anemia of chronic illness. I recommend EGD and colonoscopy to find out the cause of iron deficiency anemia, evaluate the cause of recent weight loss and to exclude malignancy. If GI workup is negative, one can consider  other workup including CT imaging study or bone marrow biopsy. The patient has significant improvement of symptoms and is reluctant to pursue additional workup. I recommend he discuss this with his primary care doctor next month  I recommend repeat blood work in iron studies in the next 2-3 months to make sure that his iron deficiency anemia does not recur   Recent weight loss The patient continues to have progressive weight loss since I saw him. I recommend GI workup but he declined. I recommend close follow-up with primary care doctor. The patient stated he has an appointment pending to see his doctor next month   All questions were answered. The patient knows to call the clinic with any problems, questions or concerns. No barriers to learning was detected.  I spent 10 minutes counseling the patient face to face. The total time spent in the appointment was 15 minutes and more than 50% was on counseling.     Diamone Whistler, MD  8/22/201712:51 PM

## 2015-11-16 ENCOUNTER — Ambulatory Visit: Payer: Medicare Other

## 2015-11-16 ENCOUNTER — Other Ambulatory Visit: Payer: Medicare Other

## 2015-11-23 ENCOUNTER — Ambulatory Visit (INDEPENDENT_AMBULATORY_CARE_PROVIDER_SITE_OTHER): Payer: Medicare Other | Admitting: Cardiovascular Disease

## 2015-11-23 VITALS — BP 106/65 | HR 82 | Ht 71.0 in | Wt 177.2 lb

## 2015-11-23 DIAGNOSIS — I2510 Atherosclerotic heart disease of native coronary artery without angina pectoris: Secondary | ICD-10-CM | POA: Diagnosis not present

## 2015-11-23 DIAGNOSIS — R0989 Other specified symptoms and signs involving the circulatory and respiratory systems: Secondary | ICD-10-CM | POA: Diagnosis not present

## 2015-11-23 DIAGNOSIS — I739 Peripheral vascular disease, unspecified: Secondary | ICD-10-CM

## 2015-11-23 DIAGNOSIS — E785 Hyperlipidemia, unspecified: Secondary | ICD-10-CM

## 2015-11-23 DIAGNOSIS — I25118 Atherosclerotic heart disease of native coronary artery with other forms of angina pectoris: Secondary | ICD-10-CM | POA: Diagnosis not present

## 2015-11-23 NOTE — Patient Instructions (Signed)
Medication Instructions:  Your physician recommends that you continue on your current medications as directed. Please refer to the Current Medication list given to you today.  Labwork: none  Testing/Procedures: Your physician has requested that you have a carotid duplex. This test is an ultrasound of the carotid arteries in your neck. It looks at blood flow through these arteries that supply the brain with blood. Allow one hour for this exam. There are no restrictions or special instructions.  Follow-Up: Your physician wants you to follow-up in: 6 month ov You will receive a reminder letter in the mail two months in advance. If you don't receive a letter, please call our office to schedule the follow-up appointment.  If you need a refill on your cardiac medications before your next appointment, please call your pharmacy.  

## 2015-11-23 NOTE — Progress Notes (Signed)
Cardiology Office Note   Date:  11/23/2015   ID:  Trevor [name], DOB Sep 17, 1945, MRN [id number]  PCP:  Antony Blackbird, MD  Cardiologist:   [name], MD   Chief Complaint  Patient presents with  . PAD    pt denied swelling in legs and feet      History of Present Illness: Trevor [name] is a 71 y.o. male who presents for a followup visit regarding coronary artery disease status post  CABG,  ischemic cardiomyopathy And peripheral arterial disease. He presented in April of 2014 with anterior ST elevation myocardial infarction and was found to have significant three-vessel coronary artery disease and moderate left main stenosis. The patient underwent CABG at that time.  Echocardiogram in July of 2014 showed improved LV systolic function with an ejection fraction of 45-50% with apical hypokinesis. He underwent a nuclear stress test in December 2016 due to worsening exertional dyspnea. The test showed fixed inferior wall defect without significant ischemia. Ejection fraction was 53%. Overall it was a low risk study.  He complained of bilateral hip and back pain with walking as well as calf claudication. He underwent noninvasive vascular evaluation which showed an ABI of 0.69 on the right and 1.1 on the left. Duplex showed severe right mid SFA stenosis with a peak velocity of 653.  He declined intervention for his peripheral arterial disease given that he felt that his symptoms were not lifestyle limiting. He was diagnosed recently with anemia with a hemoglobin of 7.8. This improved to 9.9 with iron infusion. EGD and colonoscopy were recommended but the patient has been hesitant to undergo these due to high co-pays. The dose of aspirin was decreased to 81 mg daily. Since improvement in his anemia, he reports significant improvement in symptoms of fatigue. He currently denies leg claudication and no chest pain or shortness of breath.    Past Medical History:  Diagnosis Date  . Coronary artery  disease    06/2012: Anterior ST elevation myocardial infarction. Cardiac catheterization showed significant three-vessel coronary artery disease, ejection fraction 35-40%. The patient underwent CABG with LIMA to LAD, SVG to ramus and sequential SVG to right PDA/PL  . Diabetes mellitus without complication ([place])   . Hyperlipidemia   . Ischemic cardiomyopathy    Ejection fraction of 35-40%    Past Surgical History:  Procedure Laterality Date  . CORONARY ARTERY BYPASS GRAFT N/A 07/05/2012   Procedure: CORONARY ARTERY BYPASS GRAFTING (CABG);  Surgeon: Ivin Poot, MD;  Location: [facility];  Service: Open Heart Surgery;  Laterality: N/A;  . INTRAOPERATIVE TRANSESOPHAGEAL ECHOCARDIOGRAM N/A 07/05/2012   Procedure: INTRAOPERATIVE TRANSESOPHAGEAL ECHOCARDIOGRAM;  Surgeon: Ivin Poot, MD;  Location: [facility];  Service: Open Heart Surgery;  Laterality: N/A;  . LEFT HEART CATHETERIZATION WITH CORONARY ANGIOGRAM N/A 07/04/2012   Procedure: LEFT HEART CATHETERIZATION WITH CORONARY ANGIOGRAM;  Surgeon: Wellington Hampshire, MD;  Location[facility];  Service: Cardiovascular;  Laterality: N/A;     Current Outpatient Prescriptions  Medication Sig Dispense Refill  . aspirin 81 MG EC tablet Take 1 tablet (81 mg total) by mouth daily.    Marland Kitchen atorvastatin (LIPITOR) 40 MG tablet Take 1 tablet (40 mg total) by mouth daily. 30 tablet 6  . carvedilol (COREG) 3.125 MG tablet Take 1 tablet (3.125 mg total) by mouth 2 (two) times daily. 180 tablet 2  . lisinopril (PRINIVIL,ZESTRIL) 5 MG tablet Take 1 tablet (5 mg total) by mouth daily. 90 tablet 2  . metFORMIN (GLUCOPHAGE)  1000 MG tablet Take 1 tablet by mouth 2 (two) times daily.  0   No current facility-administered medications for this visit.     Allergies:   Review of patient's allergies indicates no known allergies.    Social History:  The patient  reports that he quit smoking about 3 years ago. He has a 50.00 pack-year smoking history. He has never used  smokeless tobacco. He reports that he does not drink alcohol or use drugs.   Family History:  The patient's family history includes Cancer in his maternal grandfather and mother; Heart attack in his brother.    ROS:  Please see the history of present illness.   Otherwise, review of systems are positive for none.   All other systems are reviewed and negative.    PHYSICAL EXAM: VS:  BP 106/65   Pulse 82   Ht 5\' 11"  (1.803 m)   Wt 177 lb 3.2 oz (80.4 kg)   BMI 24.71 kg/m  , BMI Body mass index is 24.71 kg/m. GEN: Well nourished, well developed, in no acute distress  HEENT: normal  Neck: no JVD, or masses. Faint right carotid bruit Cardiac: RRR; no murmurs, rubs, or gallops,no edema  Respiratory:  clear to auscultation bilaterally, normal work of breathing GI: soft, nontender, nondistended, + BS MS: no deformity or atrophy  Skin: warm and dry, no rash Neuro:  Strength and sensation are intact Psych: euthymic mood, full affect   EKG:  EKG is ordered today. EKG shows sinus rhythm with a few PACs. Otherwise normal.   Recent Labs: 11/02/2015: HGB 9.9; Platelets 278    Lipid Panel    Component Value Date/Time   CHOL 97 07/07/2013 0930   TRIG 77.0 07/07/2013 0930   HDL 35.20 (L) 07/07/2013 0930   CHOLHDL 3 07/07/2013 0930   VLDL 15.4 07/07/2013 0930   LDLCALC 46 07/07/2013 0930      Wt Readings from Last 3 Encounters:  11/23/15 177 lb 3.2 oz (80.4 kg)  11/09/15 174 lb 11.2 oz (79.2 kg)  09/20/15 178 lb 4.8 oz (80.9 kg)         ASSESSMENT AND PLAN:  1.  Coronary artery disease: Status post CABG.  . Nuclear stress test in December was overall low risk. I recommend continuing medical therapy. Ejection fraction was 53%.  2. Ischemic cardiomyopathy: Most recent ejection fraction was 53%. He has no evidence of volume overload. Continue small dose carvedilol and lisinopril.  3. Peripheral arterial disease: The patient has evidence of severe right mid SFA stenosis . He  denies claudication at this time. Continue medical therapy.   4. Hyperlipidemia: Continue treatment with atorvastatin with a target LDL of less than 70.  5. Right carotid bruit: I requested carotid Doppler.   6. Iron deficiency anemia: I strongly advised him to follow-up with colonoscopy and EGD as was recommended.    Disposition:   FU with me in 6 months  Signed,  [name], MD  11/23/2015 8:29 AM    [place]

## 2015-11-30 ENCOUNTER — Ambulatory Visit (HOSPITAL_COMMUNITY)
Admission: RE | Admit: 2015-11-30 | Discharge: 2015-11-30 | Disposition: A | Discharge status: Home / Self Care | Payer: Medicare Other | Source: Ambulatory Visit | Attending: Cardiovascular Disease | Admitting: Cardiovascular Disease

## 2015-11-30 DIAGNOSIS — E785 Hyperlipidemia, unspecified: Secondary | ICD-10-CM | POA: Diagnosis not present

## 2015-11-30 DIAGNOSIS — E119 Type 2 diabetes mellitus without complications: Secondary | ICD-10-CM | POA: Insufficient documentation

## 2015-11-30 DIAGNOSIS — I6523 Occlusion and stenosis of bilateral carotid arteries: Secondary | ICD-10-CM | POA: Insufficient documentation

## 2015-11-30 DIAGNOSIS — Z951 Presence of aortocoronary bypass graft: Secondary | ICD-10-CM | POA: Diagnosis not present

## 2015-11-30 DIAGNOSIS — Z87891 Personal history of nicotine dependence: Secondary | ICD-10-CM | POA: Insufficient documentation

## 2015-11-30 DIAGNOSIS — I2510 Atherosclerotic heart disease of native coronary artery without angina pectoris: Secondary | ICD-10-CM | POA: Insufficient documentation

## 2015-11-30 DIAGNOSIS — R0989 Other specified symptoms and signs involving the circulatory and respiratory systems: Secondary | ICD-10-CM | POA: Diagnosis not present

## 2016-03-20 DIAGNOSIS — I639 Cerebral infarction, unspecified: Secondary | ICD-10-CM

## 2016-03-20 HISTORY — DX: Cerebral infarction, unspecified: I63.9

## 2016-05-23 ENCOUNTER — Ambulatory Visit (INDEPENDENT_AMBULATORY_CARE_PROVIDER_SITE_OTHER): Payer: Medicare Other | Admitting: Cardiovascular Disease

## 2016-05-23 ENCOUNTER — Encounter: Payer: Self-pay | Admitting: Cardiovascular Disease

## 2016-05-23 VITALS — BP 112/60 | HR 94 | Ht 71.0 in | Wt 178.4 lb

## 2016-05-23 DIAGNOSIS — I6521 Occlusion and stenosis of right carotid artery: Secondary | ICD-10-CM | POA: Diagnosis not present

## 2016-05-23 DIAGNOSIS — E785 Hyperlipidemia, unspecified: Secondary | ICD-10-CM

## 2016-05-23 DIAGNOSIS — I2510 Atherosclerotic heart disease of native coronary artery without angina pectoris: Secondary | ICD-10-CM | POA: Diagnosis not present

## 2016-05-23 DIAGNOSIS — I739 Peripheral vascular disease, unspecified: Secondary | ICD-10-CM

## 2016-05-23 NOTE — Patient Instructions (Signed)
Medication Instructions:  Your physician recommends that you continue on your current medications as directed. Please refer to the Current Medication list given to you today.  Labwork: No new orders.  Testing/Procedures: Your physician has requested that you have a carotid duplex in 6 MONTHS. This test is an ultrasound of the carotid arteries in your neck. It looks at blood flow through these arteries that supply the brain with blood. Allow one hour for this exam. There are no restrictions or special instructions.  Follow-Up: Your physician wants you to follow-up in: 6 MONTHS with Dr Fletcher Anon. You will receive a reminder letter in the mail two months in advance. If you don't receive a letter, please call our office to schedule the follow-up appointment.  Any Other Special Instructions Will Be Listed Below (If Applicable).     If you need a refill on your cardiac medications before your next appointment, please call your pharmacy.

## 2016-05-23 NOTE — Progress Notes (Signed)
Cardiology Office Note   Date:  05/23/2016   ID:  Trevor [name], DOB September 16, 1945, MRN [id number]  PCP:  Trevor Blackbird, MD  Cardiologist:   Trevor Sacramento, MD   Chief Complaint  Patient presents with  . 6 month office visit      History of Present Illness: Trevor [name] is a 71 y.o. male who presents for a followup visit regarding coronary artery disease status post  CABG,  ischemic cardiomyopathy And peripheral arterial disease. He presented in April of 2014 with anterior ST elevation myocardial infarction and was found to have significant three-vessel coronary artery disease and moderate left main stenosis. The patient underwent CABG at that time.  Echocardiogram in July of 2014 showed improved LV systolic function with an ejection fraction of 45-50% with apical hypokinesis. He underwent a nuclear stress test in December 2016 due to worsening exertional dyspnea. The test showed fixed inferior wall defect without significant ischemia. Ejection fraction was 53%. Overall it was a low risk study.  He is known to have peripheral arterial disease with moderately reduced ABI on the right due to severe SFA disease. He is being treated medically due to lack of claudication. There is also significant right carotid stenosis which is being followed.  He developed significant and deficiency anemia last year which improved with iron treatment.EGD and colonoscopy were recommended but the patient has been hesitant to undergo these due to high co-pays.  He has been doing reasonably well and denies any chest pain. He reports stable exertional dyspnea and no significant fatigue. He did have the flu few weeks ago but he is better now. He stopped taking his iron and his anemia worsened. He does look pale today but he reports that he started taking his iron again.   Past Medical History:  Diagnosis Date  . Coronary artery disease    06/2012: Anterior ST elevation myocardial infarction. Cardiac catheterization  showed significant three-vessel coronary artery disease, ejection fraction 35-40%. The patient underwent CABG with LIMA to LAD, SVG to ramus and sequential SVG to right PDA/PL  . Diabetes mellitus without complication ([place])   . Hyperlipidemia   . Ischemic cardiomyopathy    Ejection fraction of 35-40%    Past Surgical History:  Procedure Laterality Date  . CORONARY ARTERY BYPASS GRAFT N/A 07/05/2012   Procedure: CORONARY ARTERY BYPASS GRAFTING (CABG);  Surgeon: Ivin Poot, MD;  Location: [facility];  Service: Open Heart Surgery;  Laterality: N/A;  . INTRAOPERATIVE TRANSESOPHAGEAL ECHOCARDIOGRAM N/A 07/05/2012   Procedure: INTRAOPERATIVE TRANSESOPHAGEAL ECHOCARDIOGRAM;  Surgeon: Ivin Poot, MD;  Location: [facility];  Service: Open Heart Surgery;  Laterality: N/A;  . LEFT HEART CATHETERIZATION WITH CORONARY ANGIOGRAM N/A 07/04/2012   Procedure: LEFT HEART CATHETERIZATION WITH CORONARY ANGIOGRAM;  Surgeon: Wellington Hampshire, MD;  Location: [facility];  Service: Cardiovascular;  Laterality: N/A;     Current Outpatient Prescriptions  Medication Sig Dispense Refill  . aspirin 81 MG EC tablet Take 1 tablet (81 mg total) by mouth daily.    Marland Kitchen atorvastatin (LIPITOR) 40 MG tablet Take 1 tablet (40 mg total) by mouth daily. 30 tablet 6  . carvedilol (COREG) 3.125 MG tablet Take 1 tablet (3.125 mg total) by mouth 2 (two) times daily. 180 tablet 2  . ferrous sulfate 325 (65 FE) MG tablet Take 325 mg by mouth daily with breakfast.    . lisinopril (PRINIVIL,ZESTRIL) 5 MG tablet Take 1 tablet (5 mg total) by mouth daily. 90 tablet 2  .  metFORMIN (GLUCOPHAGE) 1000 MG tablet Take 1 tablet by mouth 2 (two) times daily.  0   No current facility-administered medications for this visit.     Allergies:   Patient has no known allergies.    Social History:  The patient  reports that he quit smoking about 4 years ago. He has a 50.00 pack-year smoking history. He has never used smokeless tobacco. He reports that  he does not drink alcohol or use drugs.   Family History:  The patient's family history includes Cancer in his maternal grandfather and mother; Heart attack in his brother.    ROS:  Please see the history of present illness.   Otherwise, review of systems are positive for none.   All other systems are reviewed and negative.    PHYSICAL EXAM: VS:  BP 112/60   Pulse 94   Ht 5\' 11"  (1.803 m)   Wt 178 lb 6.4 oz (80.9 kg)   BMI 24.88 kg/m  , BMI Body mass index is 24.88 kg/m. GEN: Well nourished, well developed, in no acute distress  HEENT: normal  Neck: no JVD, or masses. Faint right carotid bruit Cardiac: RRR; no murmurs, rubs, or gallops,no edema  Respiratory:  clear to auscultation bilaterally, normal work of breathing GI: soft, nontender, nondistended, + BS MS: no deformity or atrophy  Skin: warm and dry, no rash Neuro:  Strength and sensation are intact Psych: euthymic mood, full affect   EKG:  EKG is not ordered today.    Recent Labs: 11/02/2015: HGB 9.9; Platelets 278    Lipid Panel    Component Value Date/Time   CHOL 97 07/07/2013 0930   TRIG 77.0 07/07/2013 0930   HDL 35.20 (L) 07/07/2013 0930   CHOLHDL 3 07/07/2013 0930   VLDL 15.4 07/07/2013 0930   LDLCALC 46 07/07/2013 0930      Wt Readings from Last 3 Encounters:  05/23/16 178 lb 6.4 oz (80.9 kg)  11/23/15 177 lb 3.2 oz (80.4 kg)  11/09/15 174 lb 11.2 oz (79.2 kg)         ASSESSMENT AND PLAN:  1.  Coronary artery disease: Status post CABG.  He is doing well with no anginal symptoms. Continue medical therapy.  2. Ischemic cardiomyopathy: Most recent ejection fraction was 53%. He has no evidence of volume overload. Continue small dose carvedilol and lisinopril.  3. Peripheral arterial disease: The patient has evidence of severe right mid SFA stenosis . He denies claudication at this time. Continue medical therapy.   4. Hyperlipidemia: Continue treatment with atorvastatin with a target LDL of  less than 70. This is being followed by his primary care physician.  5. Right carotid stenosis: Doppler in September showed 60-79% stenosis. Repeat study in September of this year.  6. Iron deficiency anemia: This is being followed by his primary care physician. I advised him to take iron twice daily and schedule a follow-up appointment with his primary care physician to recheck.    Disposition:   FU with me in 6 months  Signed,  Trevor Sacramento, MD  05/23/2016 7:56 AM    [place]

## 2016-10-18 DIAGNOSIS — G459 Transient cerebral ischemic attack, unspecified: Secondary | ICD-10-CM | POA: Insufficient documentation

## 2016-10-18 HISTORY — DX: Transient cerebral ischemic attack, unspecified: G45.9

## 2016-11-06 ENCOUNTER — Other Ambulatory Visit: Payer: Self-pay | Admitting: *Deleted

## 2016-11-06 MED ORDER — LISINOPRIL 5 MG PO TABS: 5.0000 mg | ORAL_TABLET | Freq: Every day | ORAL | 1 refills | Status: DC

## 2016-11-11 ENCOUNTER — Emergency Department (HOSPITAL_COMMUNITY): Payer: Medicare Other

## 2016-11-11 ENCOUNTER — Other Ambulatory Visit (HOSPITAL_COMMUNITY): Payer: Self-pay

## 2016-11-11 ENCOUNTER — Other Ambulatory Visit: Payer: Self-pay

## 2016-11-11 ENCOUNTER — Encounter (HOSPITAL_COMMUNITY): Payer: Self-pay | Admitting: *Deleted

## 2016-11-11 ENCOUNTER — Inpatient Hospital Stay (HOSPITAL_COMMUNITY)
Admission: EM | Admit: 2016-11-11 | Discharge: 2016-11-16 | DRG: 280 | Disposition: A | Discharge status: Home / Self Care | Payer: Medicare Other | Attending: Family Medicine | Admitting: Family Medicine

## 2016-11-11 DIAGNOSIS — E119 Type 2 diabetes mellitus without complications: Secondary | ICD-10-CM | POA: Diagnosis not present

## 2016-11-11 DIAGNOSIS — D500 Iron deficiency anemia secondary to blood loss (chronic): Secondary | ICD-10-CM | POA: Diagnosis not present

## 2016-11-11 DIAGNOSIS — R000 Tachycardia, unspecified: Secondary | ICD-10-CM | POA: Diagnosis present

## 2016-11-11 DIAGNOSIS — C801 Malignant (primary) neoplasm, unspecified: Secondary | ICD-10-CM | POA: Diagnosis not present

## 2016-11-11 DIAGNOSIS — I6529 Occlusion and stenosis of unspecified carotid artery: Secondary | ICD-10-CM | POA: Diagnosis present

## 2016-11-11 DIAGNOSIS — I214 Non-ST elevation (NSTEMI) myocardial infarction: Secondary | ICD-10-CM | POA: Diagnosis not present

## 2016-11-11 DIAGNOSIS — C7801 Secondary malignant neoplasm of right lung: Secondary | ICD-10-CM | POA: Diagnosis present

## 2016-11-11 DIAGNOSIS — E1151 Type 2 diabetes mellitus with diabetic peripheral angiopathy without gangrene: Secondary | ICD-10-CM | POA: Diagnosis present

## 2016-11-11 DIAGNOSIS — M549 Dorsalgia, unspecified: Secondary | ICD-10-CM | POA: Diagnosis present

## 2016-11-11 DIAGNOSIS — D649 Anemia, unspecified: Secondary | ICD-10-CM | POA: Diagnosis not present

## 2016-11-11 DIAGNOSIS — I25810 Atherosclerosis of coronary artery bypass graft(s) without angina pectoris: Secondary | ICD-10-CM | POA: Diagnosis present

## 2016-11-11 DIAGNOSIS — E1122 Type 2 diabetes mellitus with diabetic chronic kidney disease: Secondary | ICD-10-CM | POA: Diagnosis present

## 2016-11-11 DIAGNOSIS — Z8249 Family history of ischemic heart disease and other diseases of the circulatory system: Secondary | ICD-10-CM

## 2016-11-11 DIAGNOSIS — R911 Solitary pulmonary nodule: Secondary | ICD-10-CM | POA: Diagnosis not present

## 2016-11-11 DIAGNOSIS — Z806 Family history of leukemia: Secondary | ICD-10-CM | POA: Diagnosis not present

## 2016-11-11 DIAGNOSIS — N289 Disorder of kidney and ureter, unspecified: Secondary | ICD-10-CM | POA: Diagnosis not present

## 2016-11-11 DIAGNOSIS — I959 Hypotension, unspecified: Secondary | ICD-10-CM | POA: Diagnosis present

## 2016-11-11 DIAGNOSIS — I5043 Acute on chronic combined systolic (congestive) and diastolic (congestive) heart failure: Secondary | ICD-10-CM | POA: Diagnosis present

## 2016-11-11 DIAGNOSIS — Z7984 Long term (current) use of oral hypoglycemic drugs: Secondary | ICD-10-CM

## 2016-11-11 DIAGNOSIS — I2582 Chronic total occlusion of coronary artery: Secondary | ICD-10-CM | POA: Diagnosis present

## 2016-11-11 DIAGNOSIS — Z87891 Personal history of nicotine dependence: Secondary | ICD-10-CM

## 2016-11-11 DIAGNOSIS — C642 Malignant neoplasm of left kidney, except renal pelvis: Secondary | ICD-10-CM | POA: Diagnosis present

## 2016-11-11 DIAGNOSIS — N2889 Other specified disorders of kidney and ureter: Secondary | ICD-10-CM | POA: Diagnosis not present

## 2016-11-11 DIAGNOSIS — D631 Anemia in chronic kidney disease: Secondary | ICD-10-CM | POA: Diagnosis present

## 2016-11-11 DIAGNOSIS — Z7982 Long term (current) use of aspirin: Secondary | ICD-10-CM

## 2016-11-11 DIAGNOSIS — N189 Chronic kidney disease, unspecified: Secondary | ICD-10-CM | POA: Diagnosis present

## 2016-11-11 DIAGNOSIS — R599 Enlarged lymph nodes, unspecified: Secondary | ICD-10-CM | POA: Diagnosis not present

## 2016-11-11 DIAGNOSIS — N179 Acute kidney failure, unspecified: Secondary | ICD-10-CM | POA: Diagnosis present

## 2016-11-11 DIAGNOSIS — C7802 Secondary malignant neoplasm of left lung: Secondary | ICD-10-CM | POA: Diagnosis present

## 2016-11-11 DIAGNOSIS — I255 Ischemic cardiomyopathy: Secondary | ICD-10-CM | POA: Diagnosis present

## 2016-11-11 DIAGNOSIS — R05 Cough: Secondary | ICD-10-CM | POA: Diagnosis not present

## 2016-11-11 DIAGNOSIS — I272 Pulmonary hypertension, unspecified: Secondary | ICD-10-CM | POA: Diagnosis present

## 2016-11-11 DIAGNOSIS — R918 Other nonspecific abnormal finding of lung field: Secondary | ICD-10-CM | POA: Diagnosis not present

## 2016-11-11 DIAGNOSIS — R0602 Shortness of breath: Secondary | ICD-10-CM | POA: Diagnosis not present

## 2016-11-11 DIAGNOSIS — E875 Hyperkalemia: Secondary | ICD-10-CM | POA: Diagnosis present

## 2016-11-11 DIAGNOSIS — I21A1 Myocardial infarction type 2: Secondary | ICD-10-CM | POA: Diagnosis present

## 2016-11-11 DIAGNOSIS — I5021 Acute systolic (congestive) heart failure: Secondary | ICD-10-CM | POA: Diagnosis not present

## 2016-11-11 DIAGNOSIS — I130 Hypertensive heart and chronic kidney disease with heart failure and stage 1 through stage 4 chronic kidney disease, or unspecified chronic kidney disease: Secondary | ICD-10-CM | POA: Diagnosis present

## 2016-11-11 DIAGNOSIS — I252 Old myocardial infarction: Secondary | ICD-10-CM

## 2016-11-11 DIAGNOSIS — I2510 Atherosclerotic heart disease of native coronary artery without angina pectoris: Secondary | ICD-10-CM | POA: Diagnosis not present

## 2016-11-11 DIAGNOSIS — Z79899 Other long term (current) drug therapy: Secondary | ICD-10-CM

## 2016-11-11 DIAGNOSIS — E860 Dehydration: Secondary | ICD-10-CM | POA: Diagnosis present

## 2016-11-11 DIAGNOSIS — E785 Hyperlipidemia, unspecified: Secondary | ICD-10-CM | POA: Diagnosis present

## 2016-11-11 DIAGNOSIS — I361 Nonrheumatic tricuspid (valve) insufficiency: Secondary | ICD-10-CM | POA: Diagnosis not present

## 2016-11-11 HISTORY — DX: Anemia, unspecified: D64.9

## 2016-11-11 LAB — BASIC METABOLIC PANEL
ANION GAP: 11 (ref 5–15)
BUN: 27 mg/dL — ABNORMAL HIGH (ref 6–20)
CO2: 19 mmol/L — AB (ref 22–32)
Calcium: 9 mg/dL (ref 8.9–10.3)
Chloride: 105 mmol/L (ref 101–111)
Creatinine, Ser: 1.76 mg/dL — ABNORMAL HIGH (ref 0.61–1.24)
GFR calc Af Amer: 43 mL/min — ABNORMAL LOW (ref >60)
GFR, EST NON AFRICAN AMERICAN: 37 mL/min — AB (ref >60)
GLUCOSE: 154 mg/dL — AB (ref 65–99)
POTASSIUM: 5.1 mmol/L (ref 3.5–5.1)
Sodium: 135 mmol/L (ref 135–145)

## 2016-11-11 LAB — GLUCOSE, CAPILLARY: Glucose-Capillary: 136 mg/dL — ABNORMAL HIGH (ref 65–99)

## 2016-11-11 LAB — I-STAT TROPONIN, ED: Troponin i, poc: 2.18 ng/mL (ref 0.00–0.08)

## 2016-11-11 LAB — CBC
HEMATOCRIT: 18.9 % — AB (ref 39.0–52.0)
HEMOGLOBIN: 5.6 g/dL — AB (ref 13.0–17.0)
MCH: 20.6 pg — AB (ref 26.0–34.0)
MCHC: 29.6 g/dL — AB (ref 30.0–36.0)
MCV: 69.5 fL — ABNORMAL LOW (ref 78.0–100.0)
Platelets: 353 10*3/uL (ref 150–400)
RBC: 2.72 MIL/uL — ABNORMAL LOW (ref 4.22–5.81)
RDW: 18.4 % — ABNORMAL HIGH (ref 11.5–15.5)
WBC: 12.9 10*3/uL — ABNORMAL HIGH (ref 4.0–10.5)

## 2016-11-11 LAB — PREPARE RBC (CROSSMATCH)

## 2016-11-11 LAB — TROPONIN I: Troponin I: 2.75 ng/mL (ref <0.03)

## 2016-11-11 LAB — ABO/RH: ABO/RH(D): O POS

## 2016-11-11 MED ORDER — SODIUM CHLORIDE 0.9 % IV SOLN
INTRAVENOUS | Status: DC
  Administered 2016-11-11 – 2016-11-12 (×2): via INTRAVENOUS

## 2016-11-11 MED ORDER — ALBUTEROL SULFATE (2.5 MG/3ML) 0.083% IN NEBU
INHALATION_SOLUTION | RESPIRATORY_TRACT | Status: AC
  Filled 2016-11-11: qty 6

## 2016-11-11 MED ORDER — SODIUM CHLORIDE 0.9 % IV BOLUS (SEPSIS)
1000.0000 mL | Freq: Once | INTRAVENOUS | Status: AC
  Administered 2016-11-11: 1000 mL via INTRAVENOUS

## 2016-11-11 MED ORDER — SODIUM CHLORIDE 0.9 % IV SOLN: 10.0000 mL/h | Freq: Once | INTRAVENOUS | Status: DC

## 2016-11-11 MED ORDER — ALBUTEROL SULFATE (2.5 MG/3ML) 0.083% IN NEBU: 5.0000 mg | INHALATION_SOLUTION | Freq: Once | RESPIRATORY_TRACT | Status: DC

## 2016-11-11 NOTE — ED Notes (Signed)
Pt updated waiting for blood to come from blood bank, pt without complaints.

## 2016-11-11 NOTE — ED Triage Notes (Signed)
Pt arrives pale and SHOB, A & O x4. Symptoms started 2 days ago, pt states his arms ache when doing things like mowing. Generalized weakness and hypoension

## 2016-11-11 NOTE — ED Provider Notes (Addendum)
[facility] DEPT Provider Note   CSN: [id number] Arrival date & time: 11/11/16  1338     History   Chief Complaint Chief Complaint  Patient presents with  . Shortness of Breath    HPI Trevor [name] is a 71 y.o. male.  HPI Complains of shortness of breath onset last night. Symptoms accompanied by heaviness in both arms. He is presently asymptomatic. Without treatment.. Nothing makes symptoms better or worse.no other associated symptoms. He denies discomfort while mowing. He uses a riding lawnmower. He denies any chest pain. He does admit to generalized weakness, worse standing. No treatment prior to coming here Past Medical History:  Diagnosis Date  . Coronary artery disease    06/2012: Anterior ST elevation myocardial infarction. Cardiac catheterization showed significant three-vessel coronary artery disease, ejection fraction 35-40%. The patient underwent CABG with LIMA to LAD, SVG to ramus and sequential SVG to right PDA/PL  . Diabetes mellitus without complication ([place])   . Hyperlipidemia   . Ischemic cardiomyopathy    Ejection fraction of 35-40%    Patient Active Problem List   Diagnosis Date Noted  . Iron deficiency anemia due to chronic blood loss 09/20/2015  . Recent weight loss 09/20/2015  . Bilateral leg pain 02/21/2015  . Coronary artery disease   . Ischemic cardiomyopathy   . Hyperlipidemia   . ST elevation myocardial infarction (STEMI) of anterior wall, initial episode of care ([place]) 07/04/2012  . Hyperglycemia 07/04/2012    Past Surgical History:  Procedure Laterality Date  . CORONARY ARTERY BYPASS GRAFT N/A 07/05/2012   Procedure: CORONARY ARTERY BYPASS GRAFTING (CABG);  Surgeon: Ivin Poot, MD;  Location: [facility];  Service: Open Heart Surgery;  Laterality: N/A;  . INTRAOPERATIVE TRANSESOPHAGEAL ECHOCARDIOGRAM N/A 07/05/2012   Procedure: INTRAOPERATIVE TRANSESOPHAGEAL ECHOCARDIOGRAM;  Surgeon: Ivin Poot, MD;  Location: [facility];  Service: Open Heart  Surgery;  Laterality: N/A;  . LEFT HEART CATHETERIZATION WITH CORONARY ANGIOGRAM N/A 07/04/2012   Procedure: LEFT HEART CATHETERIZATION WITH CORONARY ANGIOGRAM;  Surgeon: Wellington Hampshire, MD;  Location: [facility] CATH LAB;  Service: Cardiovascular;  Laterality: N/A;       Home Medications    Prior to Admission medications   Medication Sig Start Date End Date Taking? Authorizing Provider  aspirin 81 MG EC tablet Take 1 tablet (81 mg total) by mouth daily. 09/24/15  Yes Wellington Hampshire, MD  atorvastatin (LIPITOR) 40 MG tablet Take 1 tablet (40 mg total) by mouth daily. 05/20/13  Yes Wellington Hampshire, MD  carvedilol (COREG) 3.125 MG tablet Take 1 tablet (3.125 mg total) by mouth 2 (two) times daily. 08/24/15  Yes Wellington Hampshire, MD  ferrous sulfate 325 (65 FE) MG tablet Take 325 mg by mouth daily with breakfast.   Yes [provider]  lisinopril (PRINIVIL,ZESTRIL) 5 MG tablet Take 1 tablet (5 mg total) by mouth daily. 11/06/16  Yes Wellington Hampshire, MD  metFORMIN (GLUCOPHAGE) 1000 MG tablet Take 1 tablet by mouth 2 (two) times daily. 11/19/15  Yes [provider]    Family History Family History  Problem Relation Age of Onset  . Cancer Mother        leukemia  . Cancer Maternal Grandfather        possible cancer  . Heart attack Brother     Social History Social History  Substance Use Topics  . Smoking status: Former Smoker    Packs/day: 1.00    Years: 50.00    Quit date: 04/20/2012  .  Smokeless tobacco: Never Used  . Alcohol use No     Allergies   Patient has no known allergies.   Review of Systems Review of Systems  HENT: Negative.   Respiratory: Positive for shortness of breath.   Cardiovascular: Negative.   Gastrointestinal: Negative.   Musculoskeletal: Negative.   Skin: Negative.   Allergic/Immunologic: Positive for immunocompromised state.       Diabetic  Neurological: Positive for weakness.  Psychiatric/Behavioral: Negative.   All other systems  reviewed and are negative.    Physical Exam Updated Vital Signs BP (!) 84/50 (BP Location: Left Arm)   Pulse 100   Temp 98.1 F (36.7 C) (Oral)   Resp 20   Ht 6' (1.829 m)   Wt 77.1 kg (170 lb)   SpO2 100%   BMI 23.06 kg/m   Physical Exam  Constitutional: He is oriented to person, place, and time.  Chronically ill-appearing  HENT:  Head: Normocephalic and atraumatic.  Eyes: Pupils are equal, round, and reactive to light.  Conjunctiva pale  Neck: Neck supple. No tracheal deviation present. No thyromegaly present.  Cardiovascular: Normal rate and regular rhythm.   No murmur heard. Pulmonary/Chest: Effort normal and breath sounds normal.  Abdominal: Soft. Bowel sounds are normal. He exhibits no distension. There is no tenderness.  Genitourinary: Rectum normal. Rectal exam shows guaiac negative stool.  Genitourinary Comments: Dark stool, heme-negative negative  Musculoskeletal: Normal range of motion. He exhibits no edema or tenderness.  Neurological: He is alert and oriented to person, place, and time. Coordination normal.  Skin: Skin is warm and dry. No rash noted.  Psychiatric: He has a normal mood and affect.  Nursing note and vitals reviewed.    ED Treatments / Results  Labs (all labs ordered are listed, but only abnormal results are displayed) Labs Reviewed  BASIC METABOLIC PANEL - Abnormal; Notable for the following:       Result Value   CO2 19 (*)    Glucose, Bld 154 (*)    BUN 27 (*)    Creatinine, Ser 1.76 (*)    GFR calc non Af Amer 37 (*)    GFR calc Af Amer 43 (*)    All other components within normal limits  CBC - Abnormal; Notable for the following:    WBC 12.9 (*)    RBC 2.72 (*)    Hemoglobin 5.6 (*)    HCT 18.9 (*)    MCV 69.5 (*)    MCH 20.6 (*)    MCHC 29.6 (*)    RDW 18.4 (*)    All other components within normal limits  I-STAT TROPONIN, ED - Abnormal; Notable for the following:    Troponin i, poc 2.18 (*)    All other components within  normal limits  IRON AND TIBC  FERRITIN  POC OCCULT BLOOD, ED  PREPARE RBC (CROSSMATCH)  TYPE AND SCREEN    EKG  EKG Interpretation  Date/Time:  Saturday November 11 2016 14:08:13 EDT Ventricular Rate:  101 PR Interval:    QRS Duration: 82 QT Interval:  332 QTC Calculation: 431 R Axis:   13 Text Interpretation:  Sinus tachycardia Ventricular premature complex ST-t wave abnormality Artifact Abnormal ekg Lateral ischemia New since previous tracing Reconfirmed by [place], Inocente Salles [phone]) on 11/11/2016 2:52:09 PM     chest x-ray viewed by me  Radiology Dg Chest 2 View  Result Date: 11/11/2016 CLINICAL DATA:  Shortness of breath and cough. EXAM: CHEST  2 VIEW COMPARISON:  08/07/2012  FINDINGS: Low volume film. Cardiopericardial silhouette is at upper limits of normal for size. 1 cm nodular density right upper lobe is new in the interval. No pulmonary edema or pleural effusion. Patient is status post CABG. IMPRESSION: Low volume film with new 10 mm right upper lobe pulmonary nodule. CT chest without contrast recommended to further evaluate. Electronically Signed   By: Misty Stanley M.D.   On: 11/11/2016 14:44    Procedures Procedures (including critical care time)  Medications Ordered in ED Medications  0.9 %  sodium chloride infusion (not administered)  sodium chloride 0.9 % bolus 1,000 mL (1,000 mLs Intravenous New Bag/Given 11/11/16 1517)     Initial Impression / Assessment and Plan / ED Course  I have reviewed the triage vital signs and the nursing notes.  Pertinent labs & imaging results that were available during my care of the patient were reviewed by me and considered in my medical decision making (see chart for details).     hospitalist consulted who will arrange foradmission. Suggest stepdown unit. I've also consulted Dr. Radford Pax from cardiology who agrees with admission and emergent transfusion with packed red cells. Transfusion ordered. Withhold aspirin. It is okay for  patient to stay at [facility] long per Dr. Radford Pax. Cardiology will see patient in the hospital tomorrow unless needed sooner. Normal salineintravenous bolus ordered and pending blood transfusion in light of hypotension Final Clinical Impressions(s) / ED Diagnoses  Diagnosis #1 symptomatic anemia #2 hypotension #3NSTEMI #4 renal insufficiency #5 hyperglycemia #6 lung nodule CRITICAL CARE Performed by: Orlie Dakin Total critical care time: 45 minutes Critical care time was exclusive of separately billable procedures and treating other patients. Critical care was necessary to treat or prevent imminent or life-threatening deterioration. Critical care was time spent personally by me on the following activities: development of treatment plan with patient and/or surrogate as well as nursing, discussions with consultants, evaluation of patient's response to treatment, examination of patient, obtaining history from patient or surrogate, ordering and performing treatments and interventions, ordering and review of laboratory studies, ordering and review of radiographic studies, pulse oximetry and re-evaluation of patient's condition. Final diagnoses:  None    New Prescriptions New Prescriptions   No medications on file     Orlie Dakin, MD 11/11/16 [place], MD 11/11/16 [phone]

## 2016-11-11 NOTE — ED Notes (Signed)
Blood transfusion consent Signed for blood transfusions

## 2016-11-11 NOTE — H&P (Signed)
History and Physical    Trevor [name]:[id number] DOB: July 10, 1945 DOA: 11/11/2016  PCP: [name] Patient coming from: home  I have personally briefly reviewed patient's old medical records in [facility]  Chief Complaint: shortness of breath  HPI: Trevor [name] is a 71 y.o. male with medical history significant of . Coronary artery disease coronary artery bypass graft with ejection fraction 25-00% with systolic congestive heart failure, ischemic cardiomyopathy, diabetes type 2, history of ST elevation MI in 2014 admitted with shortness of breath for the last 2 days. He also complains of his both arms week and generalized weakness. He denies any chest pain. He denies any nausea vomiting. Denies any headaches changes with his vision. Patient carries a history of iron deficiency anemia. Should he has seen ahematologist year ago and was told that he has anemia and here it requires blood transfusion. He has received IV iron treatment in the past. He denies having a bone marrow biopsy. His Hemoccult was negative in the ER. He denies any blood in his urine or blood in his stool. He denies any hemoptysis hematemesis. He does complain of some weight loss and generalized weakness. Lives at home with his sister.  ED Course: Patient received a bag of IV fluids and is waiting for blood transfusion. Patient had a chest x-ray in the ER which showed low volume with a new 10 mm right upper lobe lung nodule. And is recommended to have a CT scan without contrast for further delineation. Patient had EKG in the ER showed lateral ST depressions ST-T wave changes which is new. Troponin was elevated at 2. The ER physician called Dr. Radford Pax and was advised to hold the aspirin and give  blood transfusion and admit to [facility]. The ER physician also spoke with the intensivist on call and was told to admit patient to the stepdown unit and to start him on blood transfusion. His  hemoglobin is 5.6 in the ER with a white count of 12.9 platelet count is 353. Sodium 135 potassium 5.1 BUN 27 creatinine 1.76. When I saw him his blood pressure was 84/56 with a heart rate of 102. Patient was awake alert talking in full sentences in no acute distress.  Review of Systems: As per HPI otherwise 10 point review of systems negative.   Past Medical History:  Diagnosis Date  . Coronary artery disease    06/2012: Anterior ST elevation myocardial infarction. Cardiac catheterization showed significant three-vessel coronary artery disease, ejection fraction 35-40%. The patient underwent CABG with LIMA to LAD, SVG to ramus and sequential SVG to right PDA/PL  . Diabetes mellitus without complication ([place])   . Hyperlipidemia   . Ischemic cardiomyopathy    Ejection fraction of 35-40%    Past Surgical History:  Procedure Laterality Date  . CORONARY ARTERY BYPASS GRAFT N/A 07/05/2012   Procedure: CORONARY ARTERY BYPASS GRAFTING (CABG);  Surgeon: Ivin Poot, MD;  Location: [facility];  Service: Open Heart Surgery;  Laterality: N/A;  . INTRAOPERATIVE TRANSESOPHAGEAL ECHOCARDIOGRAM N/A 07/05/2012   Procedure: INTRAOPERATIVE TRANSESOPHAGEAL ECHOCARDIOGRAM;  Surgeon: Ivin Poot, MD;  Location: [facility];  Service: Open Heart Surgery;  Laterality: N/A;  . LEFT HEART CATHETERIZATION WITH CORONARY ANGIOGRAM N/A 07/04/2012   Procedure: LEFT HEART CATHETERIZATION WITH CORONARY ANGIOGRAM;  Surgeon: Wellington Hampshire, MD;  Location: [facility] CATH LAB;  Service: Cardiovascular;  Laterality: N/A;     reports that he quit smoking about 4 years ago. He has a  50.00 pack-year smoking history. He has never used smokeless tobacco. He reports that he does not drink alcohol or use drugs.  No Known Allergies  Family History  Problem Relation Age of Onset  . Cancer Mother        leukemia  . Cancer Maternal Grandfather        possible cancer  . Heart attack Brother    Unacceptable: Noncontributory, unremarkable,  or negative. Acceptable: Family history reviewed and not pertinent (If you reviewed it)  Prior to Admission medications   Medication Sig Start Date End Date Taking? Authorizing Provider  aspirin 81 MG EC tablet Take 1 tablet (81 mg total) by mouth daily. 09/24/15  Yes Wellington Hampshire, MD  atorvastatin (LIPITOR) 40 MG tablet Take 1 tablet (40 mg total) by mouth daily. 05/20/13  Yes Wellington Hampshire, MD  carvedilol (COREG) 3.125 MG tablet Take 1 tablet (3.125 mg total) by mouth 2 (two) times daily. 08/24/15  Yes Wellington Hampshire, MD  ferrous sulfate 325 (65 FE) MG tablet Take 325 mg by mouth daily with breakfast.   Yes [provider]  lisinopril (PRINIVIL,ZESTRIL) 5 MG tablet Take 1 tablet (5 mg total) by mouth daily. 11/06/16  Yes Wellington Hampshire, MD  metFORMIN (GLUCOPHAGE) 1000 MG tablet Take 1 tablet by mouth 2 (two) times daily. 11/19/15  Yes [provider]    Physical Exam: Vitals:   11/11/16 1353 11/11/16 1359 11/11/16 1600 11/11/16 1700  BP: (!) 85/55 (!) 84/50 (!) 85/56 (!) 89/57  Pulse: 100  (!) 103 100  Resp: 20  18 19   Temp: 98.1 F (36.7 C)     TempSrc: Oral     SpO2: 100%  100% 96%  Weight: 77.1 kg (170 lb)     Height: 6' (1.829 m)       Constitutional: NAD, calm, comfortable Vitals:   11/11/16 1353 11/11/16 1359 11/11/16 1600 11/11/16 1700  BP: (!) 85/55 (!) 84/50 (!) 85/56 (!) 89/57  Pulse: 100  (!) 103 100  Resp: 20  18 19   Temp: 98.1 F (36.7 C)     TempSrc: Oral     SpO2: 100%  100% 96%  Weight: 77.1 kg (170 lb)     Height: 6' (1.829 m)      Eyes: PERRL, lids and conjunctivae PALE ENMT: Mucous membranes are dry. Posterior pharynx clear of any exudate or lesions.Normal dentition.  Neck: normal, supple, no masses, no thyromegaly Respiratory: clear to auscultation bilaterally, no wheezing, no crackles. Normal respiratory effort. No accessory muscle use.  Cardiovascular: Regular rate and rhythm, no murmurs / rubs / gallops. No extremity edema.  2+ pedal pulses. No carotid bruits.  Abdomen: no tenderness, no masses palpated. No hepatosplenomegaly. Bowel sounds positive.  Musculoskeletal: no clubbing / cyanosis. No joint deformity upper and lower extremities. Good ROM, no contractures. Normal muscle tone.  Skin: no rashes, lesions, ulcers. No induration Neurologic: CN 2-12 grossly intact. Sensation intact, DTR normal. Strength 5/5 in all 4.  Psychiatric: Normal judgment and insight. Alert and oriented x 3. Normal mood.     Labs on Admission: I have personally reviewed following labs and imaging studies  CBC:  Recent Labs Lab 11/11/16 1418  WBC 12.9*  HGB 5.6*  HCT 18.9*  MCV 69.5*  PLT 244   Basic Metabolic Panel:  Recent Labs Lab 11/11/16 1418  NA 135  K 5.1  CL 105  CO2 19*  GLUCOSE 154*  BUN 27*  CREATININE 1.76*  CALCIUM 9.0  GFR: Estimated Creatinine Clearance: 42.6 mL/min (A) (by C-G formula based on SCr of 1.76 mg/dL (H)). Liver Function Tests: No results for input(s): AST, ALT, ALKPHOS, BILITOT, PROT, ALBUMIN in the last 168 hours. No results for input(s): LIPASE, AMYLASE in the last 168 hours. No results for input(s): AMMONIA in the last 168 hours. Coagulation Profile: No results for input(s): INR, PROTIME in the last 168 hours. Cardiac Enzymes: No results for input(s): CKTOTAL, CKMB, CKMBINDEX, TROPONINI in the last 168 hours. BNP (last 3 results) No results for input(s): PROBNP in the last 8760 hours. HbA1C: No results for input(s): HGBA1C in the last 72 hours. CBG: No results for input(s): GLUCAP in the last 168 hours. Lipid Profile: No results for input(s): CHOL, HDL, LDLCALC, TRIG, CHOLHDL, LDLDIRECT in the last 72 hours. Thyroid Function Tests: No results for input(s): TSH, T4TOTAL, FREET4, T3FREE, THYROIDAB in the last 72 hours. Anemia Panel: No results for input(s): VITAMINB12, FOLATE, FERRITIN, TIBC, IRON, RETICCTPCT in the last 72 hours. Urine analysis:    Component Value  Date/Time   COLORURINE YELLOW 07/04/2012 1852   APPEARANCEUR CLEAR 07/04/2012 1852   LABSPEC 1.010 07/04/2012 1852   PHURINE 5.5 07/04/2012 1852   GLUCOSEU >1000 (A) 07/04/2012 1852   HGBUR NEGATIVE 07/04/2012 1852   BILIRUBINUR NEGATIVE 07/04/2012 1852   KETONESUR 15 (A) 07/04/2012 1852   PROTEINUR NEGATIVE 07/04/2012 1852   UROBILINOGEN 0.2 07/04/2012 1852   NITRITE NEGATIVE 07/04/2012 1852   LEUKOCYTESUR NEGATIVE 07/04/2012 1852    Radiological Exams on Admission: Dg Chest 2 View  Result Date: 11/11/2016 CLINICAL DATA:  Shortness of breath and cough. EXAM: CHEST  2 VIEW COMPARISON:  08/07/2012 FINDINGS: Low volume film. Cardiopericardial silhouette is at upper limits of normal for size. 1 cm nodular density right upper lobe is new in the interval. No pulmonary edema or pleural effusion. Patient is status post CABG. IMPRESSION: Low volume film with new 10 mm right upper lobe pulmonary nodule. CT chest without contrast recommended to further evaluate. Electronically Signed   By: Misty Stanley M.D.   On: 11/11/2016 14:44    EKG: Independently reviewed. ST T WAVE depressions in the lateral leads.  Assessment/Plan Active Problems:   Anemia NSTEMI due to severe symptomatic anemia. Plan is to give him 2 units of blood transfusion ASAP. Aspirin has been stopped due to anemia. Not able to give any ACE inhibitor's due to renal insufficiency. Beta blockers were not given due to hypotension. Patient will receive IV fluids. Dr. Radford Pax has been consulted from the ER. Will follow troponin levels and ekg.  Severe symptomatic anemia patient will need full workup from a hematologist to see where he is losing blood he has not had a bone marrow biopsy as of yet. Patient will need a follow-up with hematologist upon discharge. Iron panel has been drawn before blood transfusion.  Diabetes type 2 patient took metformin 2000 mg twice a day at home. Will hold metformin and cover him with sliding  scale.  Renal insufficiency with a creatinine of 1.76 secondary to dehydration, ACE inhibitor, metformin. Will hold metformin and ACE inhibitor and hydrate him in follow-up levels tomorrow.  10 mm right upper lobe lung nodule please order a CT scan of his chest without contrast once the patient is stabilized.  DVT prophylaxis: SCD Code Status: FULL Family Communication:  Disposition Plan:  Consults called:  Admission status: sdu   Georgette Shell MD [facility]   If 7PM-7AM, please contact night-coverage [email or web address] Password [facility]  11/11/2016,  5:35 PM

## 2016-11-12 ENCOUNTER — Encounter (HOSPITAL_COMMUNITY): Payer: Self-pay | Admitting: Cardiology

## 2016-11-12 ENCOUNTER — Inpatient Hospital Stay (HOSPITAL_COMMUNITY): Payer: Medicare Other

## 2016-11-12 DIAGNOSIS — I361 Nonrheumatic tricuspid (valve) insufficiency: Secondary | ICD-10-CM

## 2016-11-12 DIAGNOSIS — I214 Non-ST elevation (NSTEMI) myocardial infarction: Secondary | ICD-10-CM

## 2016-11-12 DIAGNOSIS — N179 Acute kidney failure, unspecified: Secondary | ICD-10-CM

## 2016-11-12 DIAGNOSIS — R918 Other nonspecific abnormal finding of lung field: Secondary | ICD-10-CM

## 2016-11-12 DIAGNOSIS — E875 Hyperkalemia: Secondary | ICD-10-CM

## 2016-11-12 LAB — IRON AND TIBC
Iron: 10 ug/dL — ABNORMAL LOW (ref 45–182)
Saturation Ratios: 3 % — ABNORMAL LOW (ref 17.9–39.5)
TIBC: 295 ug/dL (ref 250–450)
UIBC: 285 ug/dL

## 2016-11-12 LAB — GLUCOSE, CAPILLARY
GLUCOSE-CAPILLARY: 209 mg/dL — AB (ref 65–99)
Glucose-Capillary: 214 mg/dL — ABNORMAL HIGH (ref 65–99)
Glucose-Capillary: 169 mg/dL — ABNORMAL HIGH (ref 65–99)

## 2016-11-12 LAB — ECHOCARDIOGRAM COMPLETE
Height: 72 in
Weight: 2720 oz

## 2016-11-12 LAB — BASIC METABOLIC PANEL
Anion gap: 8 (ref 5–15)
BUN: 28 mg/dL — AB (ref 6–20)
CHLORIDE: 108 mmol/L (ref 101–111)
CO2: 20 mmol/L — AB (ref 22–32)
CREATININE: 1.55 mg/dL — AB (ref 0.61–1.24)
Calcium: 8.8 mg/dL — ABNORMAL LOW (ref 8.9–10.3)
GFR calc Af Amer: 51 mL/min — ABNORMAL LOW (ref >60)
GFR calc non Af Amer: 44 mL/min — ABNORMAL LOW (ref >60)
GLUCOSE: 147 mg/dL — AB (ref 65–99)
Potassium: 5.7 mmol/L — ABNORMAL HIGH (ref 3.5–5.1)
Sodium: 136 mmol/L (ref 135–145)

## 2016-11-12 LAB — APTT: aPTT: 35 seconds (ref 24–36)

## 2016-11-12 LAB — HEPARIN LEVEL (UNFRACTIONATED)

## 2016-11-12 LAB — FERRITIN: Ferritin: 16 ng/mL — ABNORMAL LOW (ref 24–336)

## 2016-11-12 LAB — PROTIME-INR
INR: 1.13
PROTHROMBIN TIME: 14.5 s (ref 11.4–15.2)

## 2016-11-12 LAB — TROPONIN I
Troponin I: 2.14 ng/mL (ref <0.03)
Troponin I: 3.49 ng/mL (ref <0.03)

## 2016-11-12 LAB — OCCULT BLOOD X 1 CARD TO LAB, STOOL: Fecal Occult Bld: NEGATIVE

## 2016-11-12 LAB — MRSA PCR SCREENING: MRSA BY PCR: NEGATIVE

## 2016-11-12 MED ORDER — FUROSEMIDE 10 MG/ML IJ SOLN
20.0000 mg | Freq: Once | INTRAMUSCULAR | Status: AC
  Administered 2016-11-12: 20 mg via INTRAVENOUS
  Filled 2016-11-12: qty 2

## 2016-11-12 MED ORDER — SODIUM CHLORIDE 0.9 % IV SOLN
510.0000 mg | Freq: Once | INTRAVENOUS | Status: AC
  Administered 2016-11-12: 510 mg via INTRAVENOUS
  Filled 2016-11-12: qty 17

## 2016-11-12 MED ORDER — TRAZODONE HCL 50 MG PO TABS
50.0000 mg | ORAL_TABLET | Freq: Once | ORAL | Status: AC
  Administered 2016-11-12: 50 mg via ORAL
  Filled 2016-11-12: qty 1

## 2016-11-12 MED ORDER — FERRIC CARBOXYMALTOSE 750 MG/15ML IV SOLN: 750.0000 mg | Freq: Once | INTRAVENOUS | Status: DC

## 2016-11-12 MED ORDER — ONDANSETRON HCL 4 MG/2ML IJ SOLN
4.0000 mg | Freq: Three times a day (TID) | INTRAMUSCULAR | Status: DC | PRN
  Administered 2016-11-12: 4 mg via INTRAVENOUS
  Filled 2016-11-12: qty 2

## 2016-11-12 MED ORDER — ATORVASTATIN CALCIUM 80 MG PO TABS
80.0000 mg | ORAL_TABLET | Freq: Every day | ORAL | Status: DC
  Administered 2016-11-12 – 2016-11-15 (×4): 80 mg via ORAL
  Filled 2016-11-12: qty 2
  Filled 2016-11-12: qty 1
  Filled 2016-11-12: qty 2
  Filled 2016-11-12: qty 1

## 2016-11-12 MED ORDER — HEPARIN BOLUS VIA INFUSION
3000.0000 [IU] | INTRAVENOUS | Status: AC
  Administered 2016-11-12: 3000 [IU] via INTRAVENOUS
  Filled 2016-11-12: qty 3000

## 2016-11-12 MED ORDER — INSULIN ASPART 100 UNIT/ML ~~LOC~~ SOLN
0.0000 [IU] | Freq: Three times a day (TID) | SUBCUTANEOUS | Status: DC
  Administered 2016-11-12: 3 [IU] via SUBCUTANEOUS
  Administered 2016-11-13 – 2016-11-15 (×4): 2 [IU] via SUBCUTANEOUS
  Administered 2016-11-15 – 2016-11-16 (×2): 3 [IU] via SUBCUTANEOUS

## 2016-11-12 MED ORDER — HEPARIN (PORCINE) IN NACL 100-0.45 UNIT/ML-% IJ SOLN
1100.0000 [IU]/h | INTRAMUSCULAR | Status: DC
Start: 2016-11-12 — End: 2016-11-13
  Administered 2016-11-12: 900 [IU]/h via INTRAVENOUS
  Filled 2016-11-12: qty 250

## 2016-11-12 NOTE — Progress Notes (Signed)
ANTICOAGULATION CONSULT NOTE - follow up  Pharmacy Consult for Heparin Indication: chest pain/ACS  No Known Allergies  Patient Measurements: Height: 6' (182.9 cm) Weight: 170 lb (77.1 kg) IBW/kg (Calculated) : 77.6 Heparin Dosing Weight: actual weight  Vital Signs: Temp: 98.2 F (36.8 C) (08/26 1615) Temp Source: Oral (08/26 1615) BP: 99/60 (08/26 1400) Pulse Rate: 122 (08/26 1700)  Labs:  Recent Labs  11/11/16 1418 11/11/16 1728 11/11/16 2320 11/12/16 0527 11/12/16 0833 11/12/16 1653  HGB 5.6*  --   --  7.8*  --   --   HCT 18.9*  --   --  25.1*  --   --   PLT 353  --   --  328  --   --   APTT  --   --   --   --  35  --   LABPROT  --   --   --   --  14.5  --   INR  --   --   --   --  1.13  --   HEPARINUNFRC  --   --   --   --   --  <0.10*  CREATININE 1.76*  --   --  1.55*  --   --   TROPONINI  --  2.75* 2.14* 3.49*  --   --     Estimated Creatinine Clearance: 48.4 mL/min (A) (by C-G formula based on SCr of 1.55 mg/dL (H)).   Medical History: Past Medical History:  Diagnosis Date  . Anemia   . Coronary artery disease    06/2012: Anterior ST elevation myocardial infarction. Cardiac catheterization showed significant three-vessel coronary artery disease, ejection fraction 35-40%. The patient underwent CABG with LIMA to LAD, SVG to ramus and sequential SVG to right PDA/PL  . Diabetes mellitus without complication ([place])   . Hyperlipidemia   . Ischemic cardiomyopathy    Ejection fraction of 35-40% initially, 45 - 50% on echo 2014, 53% by perfusion study 2016    Medications:  Scheduled:  . atorvastatin  80 mg Oral q1800  . insulin aspart  0-15 Units Subcutaneous TID WC   Infusions:  . sodium chloride    . heparin 900 Units/hr (11/12/16 1400)    Assessment: 62 yoM admitted 8/25 with SOB and elevated troponin, NSTEMI, likely related to profound anemia.  Hgb 5.6 on admission.  Cardiology notes and RN confirm on 8/26 there is NO active bleeding.  PMH  significant for iron deficiency anemia.  Guiac negative.  Pharmacy is consulted to dose Heparin.  No PTA anticoagulants CBC: Hgb 7.8 (improved from 5.6 after PRBC transfusion), Plt WNL Troponin: 2.75, 2.14, 3.49 SCr 1.55   First heparin level subtherapeutic on current IV heparin rate of 900 units/hr  Per RN, no reported bleeding  Goal of Therapy:  Heparin level 0.3-0.7 units/ml Monitor platelets by anticoagulation protocol: Yes   Plan:  1) Increase IV heparin from 900 units/hr to 1100 units/hr 2) Recheck heparin level 6 hours after rate increase 3) Daily CBC and heparin level  Adrian Saran, PharmD, BCPS Pager [phone] 11/12/2016 6:15 PM

## 2016-11-12 NOTE — Consult Note (Signed)
CARDIOLOGY CONSULT NOTE  Patient ID: Trevor [name] MRN: [id number] DOB/AGE: 1945-06-08 71 y.o.  Admit date: 11/11/2016 Primary [place] @ [facility] Primary Cardiologist Dr. Fletcher Anon Chief Complaint  SOB Requesting  Dr. Quincy Simmonds  HPI:  The patient has a history of CAD and CABG with mildly reduced EF.  His last stress test was in Dec 2016 with a [employer]. This demonstrated an EF of 53% with a medium sized defect in the basal inferolateral and mid inferolateral location.  No ischemia.   He presented to to the ED with SOB.  He has a history of iron deficiency anemia.   In the ED his  Hgb was 5.6.  He was guaiac negative.  Creat was slightly elevated. Troponin was elevated at 2.75 initially and has risen.    He reports that he has had SOB for a couple of months.  However, it was progressive until he was SOB at rest last night.  He also has had an aching in his arms.  This was at first with activity but progressed to be at rest.  He could not get comfortable before coming to the ED.  He denies every having chest pain, even with his MI before the CABG.  He has had weakness.  I don't have access to recent CBCs but it appears that the baseline Hgb was over 9.  He denies any associated symptoms.  No N/V, no jaw pain.  He does have SOB somewhat lying flat.  He has had on weight gain or edema.    Past Medical History:  Diagnosis Date  . Coronary artery disease    06/2012: Anterior ST elevation myocardial infarction. Cardiac catheterization showed significant three-vessel coronary artery disease, ejection fraction 35-40%. The patient underwent CABG with LIMA to LAD, SVG to ramus and sequential SVG to right PDA/PL  . Diabetes mellitus without complication ([place])   . Hyperlipidemia   . Ischemic cardiomyopathy    Ejection fraction of 35-40% initially, 45 - 50% on echo 2014, 53% by perfusion study 2016    Past Surgical History:  Procedure Laterality Date  . CORONARY ARTERY  BYPASS GRAFT N/A 07/05/2012   Procedure: CORONARY ARTERY BYPASS GRAFTING (CABG);  Surgeon: Ivin Poot, MD;  Location: [place];  Service: Open Heart Surgery;  Laterality: N/A;  . INTRAOPERATIVE TRANSESOPHAGEAL ECHOCARDIOGRAM N/A 07/05/2012   Procedure: INTRAOPERATIVE TRANSESOPHAGEAL ECHOCARDIOGRAM;  Surgeon: Ivin Poot, MD;  Location: [place];  Service: Open Heart Surgery;  Laterality: N/A;  . LEFT HEART CATHETERIZATION WITH CORONARY ANGIOGRAM N/A 07/04/2012   Procedure: LEFT HEART CATHETERIZATION WITH CORONARY ANGIOGRAM;  Surgeon: Wellington Hampshire, MD;  Location: [place] CATH [facility];  Service: Cardiovascular;  Laterality: N/A;    No Known Allergies Prescriptions Prior to Admission  Medication Sig Dispense Refill Last Dose  . aspirin 81 MG EC tablet Take 1 tablet (81 mg total) by mouth daily.   11/10/2016 at Unknown time  . atorvastatin (LIPITOR) 40 MG tablet Take 1 tablet (40 mg total) by mouth daily. 30 tablet 6 11/11/2016 at Unknown time  . carvedilol (COREG) 3.125 MG tablet Take 1 tablet (3.125 mg total) by mouth 2 (two) times daily. 180 tablet 2 11/11/2016 at 7am  . ferrous sulfate 325 (65 FE) MG tablet Take 325 mg by mouth daily with breakfast.   11/11/2016 at Unknown time  . lisinopril (PRINIVIL,ZESTRIL) 5 MG tablet Take 1 tablet (5 mg total) by mouth daily. 90 tablet 1 11/11/2016 at Unknown time  . metFORMIN (  GLUCOPHAGE) 1000 MG tablet Take 1 tablet by mouth 2 (two) times daily.  0 11/11/2016 at Unknown time   Family History  Problem Relation Age of Onset  . Cancer Mother        leukemia  . Cancer Maternal Grandfather        possible cancer  . Heart attack Brother     Social History   Social History  . Marital status: Widowed    Spouse name: N/A  . Number of children: N/A  . Years of education: N/A   Occupational History  . Retired Psychologist, educational    Social History Main Topics  . Smoking status: Former Smoker    Packs/day: 1.00    Years: 50.00    Quit date: 04/20/2012  . Smokeless  tobacco: Never Used  . Alcohol use No  . Drug use: No  . Sexual activity: Not on file     Comment: retired, lives with sister and husband   Other Topics Concern  . Not on file   Social History Narrative   Lives alone.     ROS:    As stated in the HPI and negative for all other systems.  Physical Exam: Blood pressure 113/81, pulse (!) 104, temperature 98.3 F (36.8 C), temperature source Oral, resp. rate 19, height 6' (1.829 m), weight 170 lb (77.1 kg), SpO2 100 %.  GENERAL:  Well appearing HEENT:  Pupils equal round and reactive, fundi not visualized, oral mucosa unremarkable NECK:  No jugular venous distention, waveform within normal limits, carotid upstroke brisk and symmetric, no bruits, no thyromegaly LYMPHATICS:  No cervical, inguinal adenopathy LUNGS:  Clear to auscultation bilaterally BACK:  No CVA tenderness CHEST:  Unremarkable HEART:  PMI not displaced or sustained,S1 and S2 within normal limits, no S3, no S4, no clicks, no rubs, no murmurs ABD:  Flat, positive bowel sounds normal in frequency in pitch, no bruits, no rebound, no guarding, no midline pulsatile mass, no hepatomegaly, no splenomegaly EXT:  2 plus pulses throughout, no edema, no cyanosis no clubbing SKIN:  No rashes no nodules NEURO:  Cranial nerves II through XII grossly intact, motor grossly intact throughout PSYCH:  Cognitively intact, oriented to person place and time   Labs: Lab Results  Component Value Date   BUN 28 (H) 11/12/2016   Lab Results  Component Value Date   CREATININE 1.55 (H) 11/12/2016   Lab Results  Component Value Date   NA 136 11/12/2016   K 5.7 (H) 11/12/2016   CL 108 11/12/2016   CO2 20 (L) 11/12/2016   Lab Results  Component Value Date   TROPONINI 3.49 (HH) 11/12/2016   Lab Results  Component Value Date   WBC 12.4 (H) 11/12/2016   HGB 7.8 (L) 11/12/2016   HCT 25.1 (L) 11/12/2016   MCV 71.5 (L) 11/12/2016   PLT 328 11/12/2016   Lab Results  Component Value  Date   CHOL 97 07/07/2013   HDL 35.20 (L) 07/07/2013   LDLCALC 46 07/07/2013   TRIG 77.0 07/07/2013   CHOLHDL 3 07/07/2013   Lab Results  Component Value Date   ALT 20 07/07/2013   AST 20 07/07/2013   ALKPHOS 92 07/07/2013   BILITOT 1.0 07/07/2013      Radiology:   CXR: Low volume film with new 10 mm right upper lobe pulmonary nodule. CT chest without contrast recommended to further evaluate.   EKG:NSR, rate 101, baseline artifact precludes adequate analysis.  ST depression in the lateral leads consistent  with ischemia.   ASSESSMENT AND PLAN:   ELEVATED TROPONIN:  NSTEMI.  This is undoubtedly related to the profound anemia.  It does not appear that this is active bleeding.  However, I will defer to the primary team.  Transfuse to at least a Hgb of 8.  Start heparin if no active bleeding.  He will need cath probably early this week although I have not yet scheduled.  I would like to see the stability of his blood count and the results of the CT below.  I discussed this with him.   PVD:  Lower extremity and carotid stenosis.  We are following this clinically.    DM:  Per PCP  CKD:  Creat is up slightly.  This would need to be optimized prior to cath.   DYSLIPIDEMIA:  Increase Lipitor to 80 and check lipid profile  ISCHEMIC CARDIOMYOPATHY:  Most recent EF was 45 -50%.  He will need a followup echo.    LUNG NODULE:  New.  Per primary team.  CT is pending.   SignedMinus Breeding 11/12/2016, 7:57 AM

## 2016-11-12 NOTE — Progress Notes (Signed)
PROGRESS NOTE [facility]   Anddy Wingert   MEQ:[id number] DOB: 1945/12/14  DOA: 11/11/2016 PCP: [facility] @ [place]   Brief Narrative:  Trevor [name] is a 71 year old male with medical history significant for CAD, systolic heart failure with ejection fraction 35-40%, diabetes type 2, hypertension and history of MI in 2014. Presented to the emergency department complaining of shortness of breath and weakness. On ED evaluation he was found to have hemoglobin of 5.6. EKG showed lateral ST segment depression with elevated troponin. Patient was admitted for blood transfusion and cardiology evaluation.  Subjective: Patient seen and examined, report feeling much better than yesterday. He denies chest pain, shortness of breath, palpitations and dizziness. Troponins continued to trend up.   Assessment & Plan: Symptomatic anemia  Unclear etiology at this time, he has history of iron deficiency anemia and iron is very low. Could be secondary to chronic kidney disease versus malignancy as there is multiple nodules in the lungs and a renal mass. Patient was transfused 2 units PRBCs. Hemoglobin of 7.8. No overt bleeding, FOBT pending IV iron given 1  NSTEMI 2/2 demand ischemia  In setting of severe anemia and history of CAD. EKG with ST segment depression in lateral leads Cardiology input appreciated Patient has been started on heparin and will need cardiac cath on some point. Echo pending  Pulmonary nodule  Incidental findings, patient with history of smoking for 40 years 2 packs per days CT scan showing possible metastasis, and renal mass ? Primary lesion  Renal Mass Incidental finding on CT chest Concern for renal cell carcinoma We'll obtain CT abdomen and pelvis We'll consult oncology in the morning This may be contributing to anemia  DM  Patient on metformin at home, will hold for now given AKI Placed on sliding scale and monitor CBG  AKI - ? CKD  component as well unknown stage  Patient hydrated with IV fluids given severe systolic dysfunction will cut back on fluids. I expect this to improve as patient received transfusion as well. Monitor creatinine in the morning  Chronic systolic dysfunction EF 29-79% Seems to be compensated at this time. Need to be careful with IV hydration Continue management per cardiology team  Hyperkalemia  Was treated with Lasix and IV fluids Check BMP in the morning  DVT prophylaxis: Heparin drip Code Status: Full code Family Communication: None at bedside Disposition Plan: Home when medically stable  Consultants:   Cardiology  Procedures:   Echocardiogram 11/13/2011  Antimicrobials: Anti-infectives    None        Objective: Vitals:   11/12/16 0400 11/12/16 0500 11/12/16 0600 11/12/16 0700  BP: 105/64 106/71 111/77 113/81  Pulse: (!) 104 (!) 102 (!) 107 (!) 104  Resp: (!) 28 (!) 28 (!) 26 19  Temp:      TempSrc:      SpO2: 96% 99% 99% 100%  Weight:      Height:        Intake/Output Summary (Last 24 hours) at 11/12/16 0832 Last data filed at 11/12/16 0245  Gross per 24 hour  Intake             2028 ml  Output                0 ml  Net             2028 ml   Filed Weights   11/11/16 1353  Weight: 77.1 kg (170 lb)    Examination:  General exam:  Appears calm and comfortable  HEENT: Pale conjunctiva, PERRLA, OP moist and clear Respiratory system: Clear to auscultation. No wheezes,crackle or rhonchi Cardiovascular system: S1 & S2 heard, RRR. No JVD, murmurs, rubs or gallops Gastrointestinal system: Abdomen is nondistended, soft and nontender. No organomegaly or masses felt. Normal bowel sounds heard. Central nervous system: Alert and oriented. No focal neurological deficits. Extremities: No pedal edema. Symmetric, strength 5/5   Skin: No rashes, lesions or ulcers Psychiatry: Judgement and insight appear normal. Mood & affect appropriate.    Data Reviewed: I have  personally reviewed following labs and imaging studies  CBC:  Recent Labs Lab 11/11/16 1418 11/12/16 0527  WBC 12.9* 12.4*  NEUTROABS  --  10.4*  HGB 5.6* 7.8*  HCT 18.9* 25.1*  MCV 69.5* 71.5*  PLT 353 017   Basic Metabolic Panel:  Recent Labs Lab 11/11/16 1418 11/12/16 0527  NA 135 136  K 5.1 5.7*  CL 105 108  CO2 19* 20*  GLUCOSE 154* 147*  BUN 27* 28*  CREATININE 1.76* 1.55*  CALCIUM 9.0 8.8*   GFR: Estimated Creatinine Clearance: 48.4 mL/min (A) (by C-G formula based on SCr of 1.55 mg/dL (H)). Liver Function Tests: No results for input(s): AST, ALT, ALKPHOS, BILITOT, PROT, ALBUMIN in the last 168 hours. No results for input(s): LIPASE, AMYLASE in the last 168 hours. No results for input(s): AMMONIA in the last 168 hours. Coagulation Profile: No results for input(s): INR, PROTIME in the last 168 hours. Cardiac Enzymes:  Recent Labs Lab 11/11/16 1728 11/11/16 2320 11/12/16 0527  TROPONINI 2.75* 2.14* 3.49*   BNP (last 3 results) No results for input(s): PROBNP in the last 8760 hours. HbA1C: No results for input(s): HGBA1C in the last 72 hours. CBG:  Recent Labs Lab 11/11/16 1904  GLUCAP 136*   Lipid Profile: No results for input(s): CHOL, HDL, LDLCALC, TRIG, CHOLHDL, LDLDIRECT in the last 72 hours. Thyroid Function Tests: No results for input(s): TSH, T4TOTAL, FREET4, T3FREE, THYROIDAB in the last 72 hours. Anemia Panel:  Recent Labs  11/11/16 1518  FERRITIN 16*  TIBC 295  IRON 10*   Sepsis Labs: No results for input(s): PROCALCITON, LATICACIDVEN in the last 168 hours.  No results found for this or any previous visit (from the past 240 hour(s)).    Radiology Studies: Dg Chest 2 View  Result Date: 11/11/2016 CLINICAL DATA:  Shortness of breath and cough. EXAM: CHEST  2 VIEW COMPARISON:  08/07/2012 FINDINGS: Low volume film. Cardiopericardial silhouette is at upper limits of normal for size. 1 cm nodular density right upper lobe is new  in the interval. No pulmonary edema or pleural effusion. Patient is status post CABG. IMPRESSION: Low volume film with new 10 mm right upper lobe pulmonary nodule. CT chest without contrast recommended to further evaluate. Electronically Signed   By: Misty Stanley M.D.   On: 11/11/2016 14:44     Scheduled Meds: . atorvastatin  80 mg Oral q1800   Continuous Infusions: . sodium chloride    . sodium chloride 250 mL/hr at 11/11/16 1754  . ferric carboxymaltose (INJECTAFER) IVPB       LOS: 1 day    Time spent: Total of 35 minutes spent with pt, greater than 50% of which was spent in discussion of  treatment, counseling and coordination of care    Chipper Oman, MD Pager: Text Page via [email or web address]   If 7PM-7AM, please contact night-coverage [email or web address] 11/12/2016, 8:32 AM

## 2016-11-12 NOTE — Progress Notes (Signed)
  Echocardiogram 2D Echocardiogram has been performed.  Trevor [name] 11/12/2016, 3:58 PM

## 2016-11-12 NOTE — Progress Notes (Signed)
Dr Glenda Chroman notifed of patient complaining of nausea.Med ordered. Patient complains of slight shortness of breath. Lungs clear. Iv rate of 125cc/hr stopped. MDaware of heart rate

## 2016-11-12 NOTE — Progress Notes (Addendum)
ANTICOAGULATION CONSULT NOTE - Initial Consult  Pharmacy Consult for Heparin Indication: chest pain/ACS  No Known Allergies  Patient Measurements: Height: 6' (182.9 cm) Weight: 170 lb (77.1 kg) IBW/kg (Calculated) : 77.6 Heparin Dosing Weight: actual weight  Vital Signs: Temp: 98.3 F (36.8 C) (08/26 0245) Temp Source: Oral (08/26 0245) BP: 113/81 (08/26 0700) Pulse Rate: 104 (08/26 0700)  Labs:  Recent Labs  11/11/16 1418 11/11/16 1728 11/11/16 2320 11/12/16 0527  HGB 5.6*  --   --  7.8*  HCT 18.9*  --   --  25.1*  PLT 353  --   --  328  CREATININE 1.76*  --   --  1.55*  TROPONINI  --  2.75* 2.14* 3.49*    Estimated Creatinine Clearance: 48.4 mL/min (A) (by C-G formula based on SCr of 1.55 mg/dL (H)).   Medical History: Past Medical History:  Diagnosis Date  . Anemia   . Coronary artery disease    06/2012: Anterior ST elevation myocardial infarction. Cardiac catheterization showed significant three-vessel coronary artery disease, ejection fraction 35-40%. The patient underwent CABG with LIMA to LAD, SVG to ramus and sequential SVG to right PDA/PL  . Diabetes mellitus without complication ([place])   . Hyperlipidemia   . Ischemic cardiomyopathy    Ejection fraction of 35-40% initially, 45 - 50% on echo 2014, 53% by perfusion study 2016    Medications:  Scheduled:  . atorvastatin  80 mg Oral q1800   Infusions:  . sodium chloride    . sodium chloride 250 mL/hr at 11/11/16 1754    Assessment: 79 yoM admitted 8/25 with SOB and elevated troponin, NSTEMI, likely related to profound anemia.  Hgb 5.6 on admission.  Cardiology notes and RN confirm on 8/26 there is NO active bleeding.  PMH significant for iron deficiency anemia.  Guiac negative.  Pharmacy is consulted to dose Heparin.  No PTA anticoagulants CBC: Hgb 7.8 (improved from 5.6 after PRBC transfusion), Plt WNL Troponin: 2.75, 2.14, 3.49 SCr 1.55  Goal of Therapy:  Heparin level 0.3-0.7  units/ml Monitor platelets by anticoagulation protocol: Yes   Plan:   Baseline PTT, PT/INR  Give heparin 3000 units bolus IV x 1 (lower than protocol 4000 units d/t anemia)  Start heparin IV infusion at 900 units/hr  Heparin level 8 hours after starting  Daily heparin level and CBC  Continue to monitor for s/s bleeding, H&H   Gretta Arab PharmD, BCPS Pager [phone] 11/12/2016 8:24 AM

## 2016-11-13 ENCOUNTER — Encounter (HOSPITAL_COMMUNITY): Payer: Self-pay | Admitting: Cardiology

## 2016-11-13 DIAGNOSIS — Z806 Family history of leukemia: Secondary | ICD-10-CM

## 2016-11-13 DIAGNOSIS — R0602 Shortness of breath: Secondary | ICD-10-CM

## 2016-11-13 DIAGNOSIS — N289 Disorder of kidney and ureter, unspecified: Secondary | ICD-10-CM

## 2016-11-13 DIAGNOSIS — R05 Cough: Secondary | ICD-10-CM

## 2016-11-13 DIAGNOSIS — D649 Anemia, unspecified: Secondary | ICD-10-CM

## 2016-11-13 DIAGNOSIS — R911 Solitary pulmonary nodule: Secondary | ICD-10-CM

## 2016-11-13 DIAGNOSIS — R599 Enlarged lymph nodes, unspecified: Secondary | ICD-10-CM

## 2016-11-13 DIAGNOSIS — Z87891 Personal history of nicotine dependence: Secondary | ICD-10-CM

## 2016-11-13 DIAGNOSIS — E119 Type 2 diabetes mellitus without complications: Secondary | ICD-10-CM

## 2016-11-13 DIAGNOSIS — I2510 Atherosclerotic heart disease of native coronary artery without angina pectoris: Secondary | ICD-10-CM

## 2016-11-13 DIAGNOSIS — C801 Malignant (primary) neoplasm, unspecified: Secondary | ICD-10-CM

## 2016-11-13 DIAGNOSIS — I214 Non-ST elevation (NSTEMI) myocardial infarction: Secondary | ICD-10-CM

## 2016-11-13 DIAGNOSIS — I5021 Acute systolic (congestive) heart failure: Secondary | ICD-10-CM

## 2016-11-13 LAB — CBC WITH DIFFERENTIAL/PLATELET
Basophils Absolute: 0 10*3/uL (ref 0.0–0.1)
Basophils Absolute: 0 10*3/uL (ref 0.0–0.1)
Basophils Relative: 0 %
Basophils Relative: 0 %
EOS ABS: 0.1 10*3/uL (ref 0.0–0.7)
EOS PCT: 0 %
Eosinophils Absolute: 0 10*3/uL (ref 0.0–0.7)
Eosinophils Relative: 1 %
HEMATOCRIT: 24.6 % — AB (ref 39.0–52.0)
HEMATOCRIT: 25.1 % — AB (ref 39.0–52.0)
HEMOGLOBIN: 7.8 g/dL — AB (ref 13.0–17.0)
HEMOGLOBIN: 7.8 g/dL — AB (ref 13.0–17.0)
LYMPHS ABS: 1.2 10*3/uL (ref 0.7–4.0)
LYMPHS ABS: 0.9 10*3/uL (ref 0.7–4.0)
LYMPHS PCT: 10 %
Lymphocytes Relative: 7 %
MCH: 22.9 pg — AB (ref 26.0–34.0)
MCH: 22.2 pg — AB (ref 26.0–34.0)
MCHC: 31.7 g/dL (ref 30.0–36.0)
MCHC: 31.1 g/dL (ref 30.0–36.0)
MCV: 71.5 fL — ABNORMAL LOW (ref 78.0–100.0)
MCV: 72.4 fL — AB (ref 78.0–100.0)
MONO ABS: 1.1 10*3/uL — AB (ref 0.1–1.0)
MONOS PCT: 9 %
Monocytes Absolute: 1.3 10*3/uL — ABNORMAL HIGH (ref 0.1–1.0)
Monocytes Relative: 10 %
NEUTROS ABS: 9.8 10*3/uL — AB (ref 1.7–7.7)
NEUTROS ABS: 10.4 10*3/uL — AB (ref 1.7–7.7)
NEUTROS PCT: 84 %
Neutrophils Relative %: 80 %
PLATELETS: 322 10*3/uL (ref 150–400)
Platelets: 328 10*3/uL (ref 150–400)
RBC: 3.51 MIL/uL — ABNORMAL LOW (ref 4.22–5.81)
RBC: 3.4 MIL/uL — AB (ref 4.22–5.81)
RDW: 20.1 % — ABNORMAL HIGH (ref 11.5–15.5)
RDW: 19.4 % — ABNORMAL HIGH (ref 11.5–15.5)
WBC: 12.4 10*3/uL — ABNORMAL HIGH (ref 4.0–10.5)
WBC: 12.3 10*3/uL — AB (ref 4.0–10.5)

## 2016-11-13 LAB — GLUCOSE, CAPILLARY
GLUCOSE-CAPILLARY: 140 mg/dL — AB (ref 65–99)
Glucose-Capillary: 139 mg/dL — ABNORMAL HIGH (ref 65–99)
Glucose-Capillary: 112 mg/dL — ABNORMAL HIGH (ref 65–99)
Glucose-Capillary: 194 mg/dL — ABNORMAL HIGH (ref 65–99)

## 2016-11-13 LAB — COMPREHENSIVE METABOLIC PANEL
ALBUMIN: 3.3 g/dL — AB (ref 3.5–5.0)
ALT: 18 U/L (ref 17–63)
AST: 29 U/L (ref 15–41)
Alkaline Phosphatase: 92 U/L (ref 38–126)
Anion gap: 9 (ref 5–15)
BUN: 21 mg/dL — AB (ref 6–20)
CHLORIDE: 107 mmol/L (ref 101–111)
CO2: 19 mmol/L — ABNORMAL LOW (ref 22–32)
Calcium: 9 mg/dL (ref 8.9–10.3)
Creatinine, Ser: 1.15 mg/dL (ref 0.61–1.24)
GFR calc Af Amer: >60 mL/min (ref >60)
Glucose, Bld: 163 mg/dL — ABNORMAL HIGH (ref 65–99)
POTASSIUM: 4.9 mmol/L (ref 3.5–5.1)
Sodium: 135 mmol/L (ref 135–145)
Total Bilirubin: 0.8 mg/dL (ref 0.3–1.2)
Total Protein: 6.8 g/dL (ref 6.5–8.1)

## 2016-11-13 LAB — CBC
HEMATOCRIT: 28.7 % — AB (ref 39.0–52.0)
Hemoglobin: 9 g/dL — ABNORMAL LOW (ref 13.0–17.0)
MCH: 23 pg — AB (ref 26.0–34.0)
MCHC: 31.4 g/dL (ref 30.0–36.0)
MCV: 73.4 fL — ABNORMAL LOW (ref 78.0–100.0)
Platelets: 320 10*3/uL (ref 150–400)
RBC: 3.91 MIL/uL — ABNORMAL LOW (ref 4.22–5.81)
RDW: 19.7 % — AB (ref 11.5–15.5)
WBC: 11.8 10*3/uL — ABNORMAL HIGH (ref 4.0–10.5)

## 2016-11-13 LAB — HEPARIN LEVEL (UNFRACTIONATED)
HEPARIN UNFRACTIONATED: 0.26 [IU]/mL — AB (ref 0.30–0.70)
Heparin Unfractionated: <0.1 IU/mL — ABNORMAL LOW (ref 0.30–0.70)

## 2016-11-13 LAB — TROPONIN I: Troponin I: 3.14 ng/mL (ref <0.03)

## 2016-11-13 LAB — PREPARE RBC (CROSSMATCH)

## 2016-11-13 MED ORDER — HEPARIN BOLUS VIA INFUSION
2000.0000 [IU] | Freq: Once | INTRAVENOUS | Status: AC
  Administered 2016-11-13: 2000 [IU] via INTRAVENOUS
  Filled 2016-11-13: qty 2000

## 2016-11-13 MED ORDER — FUROSEMIDE 10 MG/ML IJ SOLN
20.0000 mg | Freq: Once | INTRAMUSCULAR | Status: AC
  Administered 2016-11-13: 20 mg via INTRAVENOUS
  Filled 2016-11-13: qty 2

## 2016-11-13 MED ORDER — ORAL CARE MOUTH RINSE
15.0000 mL | Freq: Two times a day (BID) | OROMUCOSAL | Status: DC
  Administered 2016-11-14 – 2016-11-15 (×4): 15 mL via OROMUCOSAL

## 2016-11-13 MED ORDER — HEPARIN (PORCINE) IN NACL 100-0.45 UNIT/ML-% IJ SOLN
1400.0000 [IU]/h | INTRAMUSCULAR | Status: DC
  Filled 2016-11-13: qty 250

## 2016-11-13 MED ORDER — HEPARIN (PORCINE) IN NACL 100-0.45 UNIT/ML-% IJ SOLN
1700.0000 [IU]/h | INTRAMUSCULAR | Status: DC
  Administered 2016-11-13: 1600 [IU]/h via INTRAVENOUS
  Administered 2016-11-14: 1700 [IU]/h via INTRAVENOUS
  Filled 2016-11-13 (×2): qty 250

## 2016-11-13 MED ORDER — SODIUM CHLORIDE 0.9 % IV SOLN
Freq: Once | INTRAVENOUS | Status: AC
  Administered 2016-11-13: 09:00:00 via INTRAVENOUS

## 2016-11-13 NOTE — Progress Notes (Signed)
Cath tomorrow at [facility] at 1500 with Dr. Tamala Julian.

## 2016-11-13 NOTE — Progress Notes (Signed)
ANTICOAGULATION CONSULT NOTE - follow up  Pharmacy Consult for Heparin Indication: chest pain/ACS  No Known Allergies  Patient Measurements: Height: 6' (182.9 cm) Weight: 170 lb (77.1 kg) IBW/kg (Calculated) : 77.6 Heparin Dosing Weight: actual weight  Vital Signs: Temp: 98.3 F (36.8 C) (08/27 1600) Temp Source: Oral (08/27 1600) BP: 104/64 (08/27 1605) Pulse Rate: 107 (08/27 1400)  Labs:  Recent Labs  11/11/16 1418  11/11/16 2320 11/12/16 0527 11/12/16 0833 11/12/16 1653 11/13/16 0054 11/13/16 1515  HGB 5.6*  --   --  7.8*  --   --  7.8* 9.0*  HCT 18.9*  --   --  25.1*  --   --  24.6* 28.7*  PLT 353  --   --  328  --   --  322 320  APTT  --   --   --   --  35  --   --   --   LABPROT  --   --   --   --  14.5  --   --   --   INR  --   --   --   --  1.13  --   --   --   HEPARINUNFRC  --   --   --   --   --  <0.10* <0.10* <0.10*  CREATININE 1.76*  --   --  1.55*  --   --  1.15  --   TROPONINI  --   < > 2.14* 3.49*  --   --  3.14*  --   < > = values in this interval not displayed.  Estimated Creatinine Clearance: 65.2 mL/min (by C-G formula based on SCr of 1.15 mg/dL).   Medical History: Past Medical History:  Diagnosis Date  . Anemia   . Coronary artery disease    06/2012: Anterior ST elevation myocardial infarction. Cardiac catheterization showed significant three-vessel coronary artery disease, ejection fraction 35-40%. The patient underwent CABG with LIMA to LAD, SVG to ramus and sequential SVG to right PDA/PL  . Diabetes mellitus without complication ([place])   . Hyperlipidemia   . Ischemic cardiomyopathy    Ejection fraction of 35-40% initially, 45 - 50% on echo 2014, 53% by perfusion study 2016    Medications:  Scheduled:  . atorvastatin  80 mg Oral q1800  . heparin  2,000 Units Intravenous Once  . insulin aspart  0-15 Units Subcutaneous TID WC  . mouth rinse  15 mL Mouth Rinse BID   Infusions:  . sodium chloride    . heparin      Assessment: 40  yoM admitted 8/25 with SOB and elevated troponin, NSTEMI, likely related to profound anemia.  Hgb 5.6 on admission.  Cardiology notes and RN confirm on 8/26 there is NO active bleeding.  PMH significant for iron deficiency anemia.  Guiac negative.  Pharmacy is consulted to dose Heparin.  No PTA anticoagulants CBC: Hgb 98 (improved from 7.86 after PRBC transfusion), Plt WNL Troponin: 2.75, 2.14, 3.49, 3.14 SCr 1.15   Third heparin level subtherapeutic on current IV heparin rate of 1400 units/hr (<0.10 x 3 )  Per RN, no reported bleeding or infusion issues (she checked IV site )  Goal of Therapy:  Heparin level 0.3-0.7 units/ml Monitor platelets by anticoagulation protocol: Yes   Plan:  1) Rebolus with 2000 units IV heparin now 2) Increase heparin drip to 1600 units/hr 3) Recheck heparin level 6 hours after rate increase 4) Daily CBC and heparin level  5) Plan for cath 8/28   Everette Rank, PharmD 11/13/2016 5:21 PM

## 2016-11-13 NOTE — Progress Notes (Signed)
Progress Note  Patient Name: Trevor [name] Date of Encounter: 11/13/2016  Primary Cardiologist: Dr. Fletcher Anon   Subjective   No heart complaints   Inpatient Medications    Scheduled Meds: . atorvastatin  80 mg Oral q1800  . furosemide  20 mg Intravenous Once  . insulin aspart  0-15 Units Subcutaneous TID WC   Continuous Infusions: . sodium chloride    . sodium chloride    . heparin 1,400 Units/hr (11/13/16 0319)   PRN Meds: ondansetron (ZOFRAN) IV   Vital Signs    Vitals:   11/12/16 2248 11/12/16 2346 11/13/16 0000 11/13/16 0400  BP: 110/71  92/62 (!) 96/54  Pulse: (!) 119  (!) 106 (!) 109  Resp: (!) 30  (!) 25 (!) 33  Temp:  97.7 F (36.5 C)  97.8 F (36.6 C)  TempSrc:  Oral  Oral  SpO2: 93%  93% 93%  Weight:      Height:        Intake/Output Summary (Last 24 hours) at 11/13/16 0749 Last data filed at 11/13/16 0500  Gross per 24 hour  Intake          1466.52 ml  Output             1275 ml  Net           191.52 ml   Filed Weights   11/11/16 1353  Weight: 170 lb (77.1 kg)    Telemetry    NSR  - Personally Reviewed  ECG    NSR anterolateral T wave changes  - Personally Reviewed  Physical Exam  Pale elderly male  GEN: No acute distress.   Neck: No JVD Cardiac: RRR, no murmurs, rubs, or gallops. Post sternotomy  Respiratory: Clear to auscultation bilaterally. GI: Soft, nontender, non-distended  MS: No edema; No deformity. Neuro:  Nonfocal  Psych: Normal affect   Labs    Chemistry Recent Labs Lab 11/11/16 1418 11/12/16 0527 11/13/16 0054  NA 135 136 135  K 5.1 5.7* 4.9  CL 105 108 107  CO2 19* 20* 19*  GLUCOSE 154* 147* 163*  BUN 27* 28* 21*  CREATININE 1.76* 1.55* 1.15  CALCIUM 9.0 8.8* 9.0  PROT  --   --  6.8  ALBUMIN  --   --  3.3*  AST  --   --  29  ALT  --   --  18  ALKPHOS  --   --  92  BILITOT  --   --  0.8  GFRNONAA 37* 44* >60  GFRAA 43* 51* >60  ANIONGAP 11 8 9      Hematology Recent Labs Lab 11/11/16 1418  11/12/16 0527 11/13/16 0054  WBC 12.9* 12.4* 12.3*  RBC 2.72* 3.51* 3.40*  HGB 5.6* 7.8* 7.8*  HCT 18.9* 25.1* 24.6*  MCV 69.5* 71.5* 72.4*  MCH 20.6* 22.2* 22.9*  MCHC 29.6* 31.1 31.7  RDW 18.4* 19.4* 20.1*  PLT 353 328 322    Cardiac Enzymes Recent Labs Lab 11/11/16 1728 11/11/16 2320 11/12/16 0527 11/13/16 0054  TROPONINI 2.75* 2.14* 3.49* 3.14*    Recent Labs Lab 11/11/16 1438  TROPIPOC 2.18*     BNPNo results for input(s): BNP, PROBNP in the last 168 hours.   DDimer No results for input(s): DDIMER in the last 168 hours.   Radiology    Ct Abdomen Pelvis Wo Contrast  Result Date: 11/12/2016 CLINICAL DATA:  Nausea and shortness of Breath EXAM: CT ABDOMEN AND PELVIS WITHOUT CONTRAST TECHNIQUE: Multidetector CT  imaging of the abdomen and pelvis was performed following the standard protocol without IV contrast. COMPARISON:  None. FINDINGS: Lower chest: Small bilateral pleural effusions are noted. Mild bibasilar atelectatic changes are seen. There are nodular changes identified in the right lower lobe as well as some smaller scattered nodules seen. The largest of these measures 15 mm on image number 80 of series 3. These are similar to that seen on recent chest CT from earlier in the same day. Hepatobiliary: No focal liver abnormality is seen. No gallstones, gallbladder wall thickening, or biliary dilatation. Pancreas: Unremarkable. No pancreatic ductal dilatation or surrounding inflammatory changes. Spleen: Normal in size without focal abnormality. Adrenals/Urinary Tract: The adrenal glands are within normal limits. The right kidney is unremarkable. The left kidney demonstrates a large mixed attenuation mass lesion which measures approximately the 11 cm in greatest dimension. This is similar to that seen on the prior CT of the chest. These changes are consistent with renal cell carcinoma. The bladder is within normal limits. Stomach/Bowel: Stomach is within normal limits. Appendix  appears normal. No evidence of bowel wall thickening, distention, or inflammatory changes. Vascular/Lymphatic: Aortic atherosclerosis. No enlarged abdominal or pelvic lymph nodes. Reproductive: Prostate is enlarged in size. Other: No abdominal wall hernia or abnormality. No abdominopelvic ascites. Musculoskeletal: Degenerative changes of lumbar spine are noted. IMPRESSION: Large left renal mass consistent with renal cell carcinoma. There are changes consistent with metastatic disease in the lungs. Pleural effusions are also noted. Electronically Signed   By: Inez Catalina M.D.   On: 11/12/2016 16:52   Dg Chest 2 View  Result Date: 11/11/2016 CLINICAL DATA:  Shortness of breath and cough. EXAM: CHEST  2 VIEW COMPARISON:  08/07/2012 FINDINGS: Low volume film. Cardiopericardial silhouette is at upper limits of normal for size. 1 cm nodular density right upper lobe is new in the interval. No pulmonary edema or pleural effusion. Patient is status post CABG. IMPRESSION: Low volume film with new 10 mm right upper lobe pulmonary nodule. CT chest without contrast recommended to further evaluate. Electronically Signed   By: Misty Stanley M.D.   On: 11/11/2016 14:44   Ct Chest Wo Contrast  Result Date: 11/12/2016 CLINICAL DATA:  Abnormal chest radiograph, pulmonary nodule EXAM: CT CHEST WITHOUT CONTRAST TECHNIQUE: Multidetector CT imaging of the chest was performed following the standard protocol without IV contrast. COMPARISON:  Chest radiographs dated 11/11/2016 FINDINGS: Cardiovascular: Heart is top-normal in size. No pericardial effusion. No evidence of thoracic aortic aneurysm. Atherosclerotic calcifications aortic arch. Three vessel coronary atherosclerosis. Postsurgical changes related to prior CABG. Mediastinum/Nodes: 15 mm short axis subcarinal node. Additional small mediastinal nodes which not meet pathologic CT size criteria. Visualized thyroid is unremarkable. Lungs/Pleura: Multiple bilateral pulmonary  nodules, approximately 12-15 in number, most of which are in the right lung, suspicious for metastases. Dominant nodules include: --10 mm nodule in the posterior left upper lobe (series 5/image 51) --19 mm nodule in the right upper lobe along the minor fissure (series 5/ image 60) --18 mm nodule in the posterior right lower lobe (series 5/ image 82) No focal consolidation. Mild patchy left lower lobe opacity, likely atelectasis. Small bilateral pleural effusions. No pneumothorax. Upper Abdomen: Visualized upper abdomen is notable for a 9.1 x 11.6 cm left renal mass, incompletely visualized/evaluated, compatible with renal cell carcinoma. Musculoskeletal: Mild degenerative changes the visualized thoracolumbar spine. Median sternotomy. No suspicious osseous lesions. IMPRESSION: Multiple bilateral pulmonary nodules, suspicious for metastases, measuring up to 19 mm in the right upper lobe. 15 mm  short axis subcarinal node, indeterminate. 9.1 x 11.6 cm left renal mass, incompletely visualized/evaluated, compatible with renal cell carcinoma. This likely reflects the primary malignancy. Small bilateral pleural effusions. Aortic Atherosclerosis (ICD10-I70.0). Electronically Signed   By: Julian Hy M.D.   On: 11/12/2016 10:29    Cardiac Studies   11/12/16  Echo Study Conclusions  - Left ventricle: The cavity size was normal. There was mild focal   basal hypertrophy of the septum. Systolic function was severely   reduced. The estimated ejection fraction was in the range of 20%   to 25%. Images were inadequate for LV wall motion assessment. - Mitral valve: There was mild regurgitation. - Left atrium: The atrium was mildly to moderately dilated. - Right ventricle: The cavity size was mildly dilated. Wall   thickness was normal. - Tricuspid valve: There was moderate regurgitation. - Pulmonary arteries: PA peak pressure: 52 mm Hg (S).  Impressions:  - The right ventricular systolic pressure was  increased consistent   with moderate pulmonary hypertension.  Recommendations:  Limited study with definity contrast to evaluate wall motion and rule out apical thrombus.  Patient Profile     71 y.o. male with hx CAD, hx ant MI 2014 and CABG 2014, DM, ICM now with EF up to 53% in 2016, HLD now presenting with with SOB with Hgb 5.6, and iron def anemia.  + troponin and ST depression in lat. Leads consistent with ischemia.    Assessment & Plan    NSTEMI   With Troponin 3.49 now decreasing and Hgb 5.6.  To tranfuse to Hgb 8.0.  Today 7.8   EKG SR with lat ischemia.  IV heparin infusing.  Plan for cath cath from left radial in am.     Profound anemia heme neg Iron 10, transfused with improved Hgb.  Per IM  Multiple bilateral pulmonary nodules, suspicious for metastases   left renal mass,  9.1 x 11.6 cm incompletely visualized/evaluated, compatible with renal cell carcinoma. This likely reflects the primary malignancy.Dr Maryellen Pile to see. Not likely a surgical candidate so if he needs stenting probably ok to proceed with BMS   Hypotension 87/60- 94/62   ICM improved with last nuc 2016 though now at 20-25%.  Mild MR mod TR, PA pk pressure 52 mmHg.  On echo recommendation to do limited study with definity contrast to eval wall motion and rule out apical thrombus.  DM per IM   CKD  Cr 1.15 today   Hyperkalemia improved now 4.9      Signed, Cecilie Kicks, NP  11/13/2016, 7:49 AM

## 2016-11-13 NOTE — Care Management Note (Signed)
Case Management Note  Patient Details  Name: Trevor [name] MRN: [id number] Date of Birth: 20-Aug-1945  Subjective/Objective:                  71 year old male with medical history significant for CAD, systolic heart failure with ejection fraction 35-40%, diabetes type 2, hypertension and history of MI in 2014. Presented to the emergency department complaining of shortness of breath and weakness. On ED evaluation he was found to have hemoglobin of 5.6. EKG showed lateral ST segment depression with elevated troponin. Patient was admitted for blood transfusion and cardiology evaluation.  Action/Plan: Date:  November 13, 2016 Chart reviewed for concurrent status and case management needs. Will continue to follow patient progress. Discharge Planning: following for needs Expected discharge date: [id number] Velva Harman, BSN, [place], [place]  Expected Discharge Date:                  Expected Discharge Plan:  Home/Self Care  In-House Referral:     Discharge planning Services  CM Consult  Post Acute Care Choice:    Choice offered to:     DME Arranged:    DME Agency:     HH Arranged:    HH Agency:     Status of Service:  In process, will continue to follow  If discussed at Long Length of Stay Meetings, dates discussed:    Additional Comments:  Leeroy Cha, RN 11/13/2016, 8:18 AM

## 2016-11-13 NOTE — Progress Notes (Signed)
ANTICOAGULATION CONSULT NOTE - follow up  Pharmacy Consult for Heparin Indication: chest pain/ACS  No Known Allergies  Patient Measurements: Height: 6' (182.9 cm) Weight: 170 lb (77.1 kg) IBW/kg (Calculated) : 77.6 Heparin Dosing Weight: actual weight  Vital Signs: Temp: 97.7 F (36.5 C) (08/26 2346) Temp Source: Oral (08/26 2346) BP: 92/62 (08/27 0000) Pulse Rate: 106 (08/27 0000)  Labs:  Recent Labs  11/11/16 1418  11/11/16 2320 11/12/16 0527 11/12/16 0833 11/12/16 1653 11/13/16 0054  HGB 5.6*  --   --  7.8*  --   --  7.8*  HCT 18.9*  --   --  25.1*  --   --  24.6*  PLT 353  --   --  328  --   --  322  APTT  --   --   --   --  35  --   --   LABPROT  --   --   --   --  14.5  --   --   INR  --   --   --   --  1.13  --   --   HEPARINUNFRC  --   --   --   --   --  <0.10* <0.10*  CREATININE 1.76*  --   --  1.55*  --   --  1.15  TROPONINI  --   < > 2.14* 3.49*  --   --  3.14*  < > = values in this interval not displayed.  Estimated Creatinine Clearance: 65.2 mL/min (by C-G formula based on SCr of 1.15 mg/dL).   Medical History: Past Medical History:  Diagnosis Date  . Anemia   . Coronary artery disease    06/2012: Anterior ST elevation myocardial infarction. Cardiac catheterization showed significant three-vessel coronary artery disease, ejection fraction 35-40%. The patient underwent CABG with LIMA to LAD, SVG to ramus and sequential SVG to right PDA/PL  . Diabetes mellitus without complication ([place])   . Hyperlipidemia   . Ischemic cardiomyopathy    Ejection fraction of 35-40% initially, 45 - 50% on echo 2014, 53% by perfusion study 2016    Medications:  Scheduled:  . atorvastatin  80 mg Oral q1800  . heparin  2,000 Units Intravenous Once  . insulin aspart  0-15 Units Subcutaneous TID WC   Infusions:  . sodium chloride    . heparin      Assessment: 83 yoM admitted 8/25 with SOB and elevated troponin, NSTEMI, likely related to profound anemia.  Hgb 5.6  on admission.  Cardiology notes and RN confirm on 8/26 there is NO active bleeding.  PMH significant for iron deficiency anemia.  Guiac negative.  Pharmacy is consulted to dose Heparin.  No PTA anticoagulants CBC: Hgb 7.8 (improved from 5.6 after PRBC transfusion), Plt WNL Troponin: 2.75, 2.14, 3.49 SCr 1.55   Second heparin level subtherapeutic on current IV heparin rate of 1100 units/hr (<0.10 x 2 )  Per RN, no reported bleeding or infusion issues (she checked IV site )  Goal of Therapy:  Heparin level 0.3-0.7 units/ml Monitor platelets by anticoagulation protocol: Yes   Plan:  1) Rebolus with 2000 units IV heparin now 2) Increase heparin drip to 1400 units/hr 3) Recheck heparin level 6 hours after rate increase 4) Daily CBC and heparin level   Lawana Pai R 11/13/2016 3:00 AM

## 2016-11-13 NOTE — Consult Note (Signed)
[facility] [phone]   [phone]  CONSULT NOTE  REFERRING PHYSICIAN: Patrecia Pour, Christean Grief, MD  REASON FOR CONSULTATION:  71 years old white male with suspicious renal cell carcinoma.  HPI Trevor [name] is a 71 y.o. male with past medical history significant for multiple medical problems including history of coronary artery disease status post myocardial infarction and chronic artery bypass surgery by Dr. Prescott Gum in 2014. The patient also has a history of diabetes mellitus, dyslipidemia as well as ischemic cardiomyopathy and long history of anemia. He was receiving intravenous iron infusion under the care of Dr. Alvy Bimler at the [facility]. He presented to the emergency department on 11/11/2016 complaining of worsening shortness of breath. During his evaluation he was found to have severe anemia with hemoglobin down to 5.6. The patient received PRBCs transfusion. Lab work showed ferritin level of 16, serum iron 10 and iron saturation 3%. Cardiac enzymes also showed elevated troponin I. the patient also received intravenous iron infusion. CBC today showed hemoglobin was up to 7.8. During his evaluation the patient had CT scan of the chest, abdomen and pelvis performed on 11/12/2016 and it showed a large mixed attenuation mass lesion in the left kidney measured approximately 11 cm in greatest dimension. Mrs. changes are consistent with renal cell carcinoma. Scan of the chest showed multiple bilateral per my nodules approximately 12-15 in number most of which are in the right lung suspicious for metastases including a 1.0 cm nodule in the posterior left upper lobe, 1.9 cm nodule in the right upper lobe along the major fissure, 1.8 cm nodule in the posterior right lower lobe. There was also 1.5 cm short axis subcarinal lymph node. I was consulted to see the patient today for evaluation and recommendation regarding his condition. When seen today the patient  continues to complain of fatigue and shortness of breath. He denied having any current chest pain at has mild cough with no hemoptysis. He has no fever or chills. He denied having any nausea or vomiting. Family history significant for mother with leukemia, other with heart attack. The patient has a long history of smoking for over 50 years but he quit in 2014 after the cardiac surgery. HPI  Past Medical History:  Diagnosis Date  . Anemia   . Coronary artery disease    06/2012: Anterior ST elevation myocardial infarction. Cardiac catheterization showed significant three-vessel coronary artery disease, ejection fraction 35-40%. The patient underwent CABG with LIMA to LAD, SVG to ramus and sequential SVG to right PDA/PL  . Diabetes mellitus without complication ([place])   . Hyperlipidemia   . Ischemic cardiomyopathy    Ejection fraction of 35-40% initially, 45 - 50% on echo 2014, 53% by perfusion study 2016    Past Surgical History:  Procedure Laterality Date  . CORONARY ARTERY BYPASS GRAFT N/A 07/05/2012   Procedure: CORONARY ARTERY BYPASS GRAFTING (CABG);  Surgeon: Ivin Poot, MD;  Location: [facility];  Service: Open Heart Surgery;  Laterality: N/A;  . INTRAOPERATIVE TRANSESOPHAGEAL ECHOCARDIOGRAM N/A 07/05/2012   Procedure: INTRAOPERATIVE TRANSESOPHAGEAL ECHOCARDIOGRAM;  Surgeon: Ivin Poot, MD;  Location: [facility];  Service: Open Heart Surgery;  Laterality: N/A;  . LEFT HEART CATHETERIZATION WITH CORONARY ANGIOGRAM N/A 07/04/2012   Procedure: LEFT HEART CATHETERIZATION WITH CORONARY ANGIOGRAM;  Surgeon: Wellington Hampshire, MD;  Location: [facility] CATH LAB;  Service: Cardiovascular;  Laterality: N/A;    Family History  Problem Relation Age of Onset  . Cancer Mother  leukemia  . Cancer Maternal Grandfather        possible cancer  . Heart attack Brother 56    Social History Social History  Substance Use Topics  . Smoking status: Former Smoker    Packs/day: 1.00    Years: 50.00     Quit date: 04/20/2012  . Smokeless tobacco: Never Used  . Alcohol use No    No Known Allergies  Current Facility-Administered Medications  Medication Dose Route Frequency Provider Last Rate Last Dose  . 0.9 %  sodium chloride infusion  10 mL/hr Intravenous Once Orlie Dakin, MD      . 0.9 %  sodium chloride infusion   Intravenous Once Patrecia Pour, Christean Grief, MD      . atorvastatin (LIPITOR) tablet 80 mg  80 mg Oral q1800 Minus Breeding, MD   80 mg at 11/12/16 1739  . furosemide (LASIX) injection 20 mg  20 mg Intravenous Once Patrecia Pour, Christean Grief, MD      . furosemide (LASIX) injection 20 mg  20 mg Intravenous Once Patrecia Pour, Christean Grief, MD      . heparin ADULT infusion 100 units/mL (25000 units/242mL sodium chloride 0.45%)  1,400 Units/hr Intravenous Continuous Dorrene German, RPH 14 mL/hr at 11/13/16 0319 1,400 Units/hr at 11/13/16 0319  . insulin aspart (novoLOG) injection 0-15 Units  0-15 Units Subcutaneous TID WC Patrecia Pour, Christean Grief, MD   3 Units at 11/12/16 1647  . ondansetron (ZOFRAN) injection 4 mg  4 mg Intravenous Q8H PRN Patrecia Pour, Christean Grief, MD   4 mg at 11/12/16 1337    Review of Systems  Constitutional: positive for fatigue Eyes: negative Ears, nose, mouth, throat, and face: negative Respiratory: positive for cough and dyspnea on exertion Cardiovascular: negative Gastrointestinal: negative Genitourinary:negative Integument/breast: negative Hematologic/lymphatic: negative Musculoskeletal:negative Neurological: negative Behavioral/Psych: negative Endocrine: negative Allergic/Immunologic: negative  Physical Exam  IRW:ERXVQ, healthy, no distress, well nourished, well developed and anxious SKIN: skin color, texture, turgor are normal, no rashes or significant lesions HEAD: Normocephalic, No masses, lesions, tenderness or abnormalities EYES: normal, PERRLA, Conjunctiva are pink and non-injected EARS: External ears normal, Canals clear OROPHARYNX:no exudate, no  erythema and lips, buccal mucosa, and tongue normal  NECK: supple, no adenopathy, no JVD LYMPH:  no palpable lymphadenopathy, no hepatosplenomegaly LUNGS: scattered rales bilaterally HEART: regular rate & rhythm and no murmurs ABDOMEN:abdomen soft, non-tender, normal bowel sounds and no masses or organomegaly BACK: Back symmetric, no curvature., No CVA tenderness EXTREMITIES:no joint deformities, effusion, or inflammation, 1+ edema.  NEURO: alert & oriented x 3 with fluent speech, no focal motor/sensory deficits  PERFORMANCE STATUS: ECOG 1  LABORATORY DATA: Lab Results  Component Value Date   WBC 12.3 (H) 11/13/2016   HGB 7.8 (L) 11/13/2016   HCT 24.6 (L) 11/13/2016   MCV 72.4 (L) 11/13/2016   PLT 322 11/13/2016    @LASTCHEM @  RADIOGRAPHIC STUDIES: Ct Abdomen Pelvis Wo Contrast  Result Date: 11/12/2016 CLINICAL DATA:  Nausea and shortness of Breath EXAM: CT ABDOMEN AND PELVIS WITHOUT CONTRAST TECHNIQUE: Multidetector CT imaging of the abdomen and pelvis was performed following the standard protocol without IV contrast. COMPARISON:  None. FINDINGS: Lower chest: Small bilateral pleural effusions are noted. Mild bibasilar atelectatic changes are seen. There are nodular changes identified in the right lower lobe as well as some smaller scattered nodules seen. The largest of these measures 15 mm on image number 80 of series 3. These are similar to that seen on recent chest CT from earlier in the same day. Hepatobiliary:  No focal liver abnormality is seen. No gallstones, gallbladder wall thickening, or biliary dilatation. Pancreas: Unremarkable. No pancreatic ductal dilatation or surrounding inflammatory changes. Spleen: Normal in size without focal abnormality. Adrenals/Urinary Tract: The adrenal glands are within normal limits. The right kidney is unremarkable. The left kidney demonstrates a large mixed attenuation mass lesion which measures approximately the 11 cm in greatest dimension. This  is similar to that seen on the prior CT of the chest. These changes are consistent with renal cell carcinoma. The bladder is within normal limits. Stomach/Bowel: Stomach is within normal limits. Appendix appears normal. No evidence of bowel wall thickening, distention, or inflammatory changes. Vascular/Lymphatic: Aortic atherosclerosis. No enlarged abdominal or pelvic lymph nodes. Reproductive: Prostate is enlarged in size. Other: No abdominal wall hernia or abnormality. No abdominopelvic ascites. Musculoskeletal: Degenerative changes of lumbar spine are noted. IMPRESSION: Large left renal mass consistent with renal cell carcinoma. There are changes consistent with metastatic disease in the lungs. Pleural effusions are also noted. Electronically Signed   By: Inez Catalina M.D.   On: 11/12/2016 16:52   Dg Chest 2 View  Result Date: 11/11/2016 CLINICAL DATA:  Shortness of breath and cough. EXAM: CHEST  2 VIEW COMPARISON:  08/07/2012 FINDINGS: Low volume film. Cardiopericardial silhouette is at upper limits of normal for size. 1 cm nodular density right upper lobe is new in the interval. No pulmonary edema or pleural effusion. Patient is status post CABG. IMPRESSION: Low volume film with new 10 mm right upper lobe pulmonary nodule. CT chest without contrast recommended to further evaluate. Electronically Signed   By: Misty Stanley M.D.   On: 11/11/2016 14:44   Ct Chest Wo Contrast  Result Date: 11/12/2016 CLINICAL DATA:  Abnormal chest radiograph, pulmonary nodule EXAM: CT CHEST WITHOUT CONTRAST TECHNIQUE: Multidetector CT imaging of the chest was performed following the standard protocol without IV contrast. COMPARISON:  Chest radiographs dated 11/11/2016 FINDINGS: Cardiovascular: Heart is top-normal in size. No pericardial effusion. No evidence of thoracic aortic aneurysm. Atherosclerotic calcifications aortic arch. Three vessel coronary atherosclerosis. Postsurgical changes related to prior CABG.  Mediastinum/Nodes: 15 mm short axis subcarinal node. Additional small mediastinal nodes which not meet pathologic CT size criteria. Visualized thyroid is unremarkable. Lungs/Pleura: Multiple bilateral pulmonary nodules, approximately 12-15 in number, most of which are in the right lung, suspicious for metastases. Dominant nodules include: --10 mm nodule in the posterior left upper lobe (series 5/image 51) --19 mm nodule in the right upper lobe along the minor fissure (series 5/ image 60) --18 mm nodule in the posterior right lower lobe (series 5/ image 82) No focal consolidation. Mild patchy left lower lobe opacity, likely atelectasis. Small bilateral pleural effusions. No pneumothorax. Upper Abdomen: Visualized upper abdomen is notable for a 9.1 x 11.6 cm left renal mass, incompletely visualized/evaluated, compatible with renal cell carcinoma. Musculoskeletal: Mild degenerative changes the visualized thoracolumbar spine. Median sternotomy. No suspicious osseous lesions. IMPRESSION: Multiple bilateral pulmonary nodules, suspicious for metastases, measuring up to 19 mm in the right upper lobe. 15 mm short axis subcarinal node, indeterminate. 9.1 x 11.6 cm left renal mass, incompletely visualized/evaluated, compatible with renal cell carcinoma. This likely reflects the primary malignancy. Small bilateral pleural effusions. Aortic Atherosclerosis (ICD10-I70.0). Electronically Signed   By: Julian Hy M.D.   On: 11/12/2016 10:29    ASSESSMENT: This is a very pleasant 71 years old white male with multiple medical problems including history of coronary artery disease and chronic anemia was found recently to have large left renal mass  with multiple bilateral pulmonary nodules suspicious for metastatic renal cell carcinoma.  PLAN: I had a lengthy discussion with the patient today about his current condition and further investigation to confirm his diagnosis. I recommended for the patient to have urology  consultation for evaluation of his condition and discussed the option of nephrectomy if needed. If the patient is not a good candidate for nephrectomy, we will consider him for CT-guided core biopsy of the left renal mass for tissue confirmation followed by discussion of systemic therapy including immunotherapy with Yervoy and Nivolumab. For the persistent anemia, the patient received intravenous iron infusion and he probably would benefit from weekly doses for now until correction of his iron study and ferritin. He will also benefit from transfusion before consideration of cardiac catheterization for evaluation of his coronary artery disease. I will arrange for the patient a follow-up appointment with me at the [facility] after discharge from the hospital for more detailed discussion of his treatment options. The patient voices understanding of current disease status and treatment options and is in agreement with the current care plan.  All questions were answered. The patient knows to call the clinic with any problems, questions or concerns. We can certainly see the patient much sooner if necessary.  Thank you so much for allowing me to participate in the care of Trevor [name]. I will continue to follow up the patient with you and assist in his care.  Disclaimer: This note was dictated with voice recognition software. Similar sounding words can inadvertently be transcribed and may not be corrected upon review.   Eilleen Kempf November 13, 2016, 8:24 AM

## 2016-11-13 NOTE — Progress Notes (Signed)
PROGRESS NOTE [facility]   Trevor [name]   ALP:[id number] DOB: [birth year]  DOA: 11/11/2016 PCP: [facility] @ [place]   Brief Narrative:  Trevor [name] is a 71 year old male with medical history significant for CAD, systolic heart failure with ejection fraction 35-40%, diabetes type 2, hypertension and history of MI in 2014. Presented to the emergency department complaining of shortness of breath and weakness. On ED evaluation he was found to have hemoglobin of 5.6. EKG showed lateral ST segment depression with elevated troponin. Patient was admitted for blood transfusion and cardiology evaluation. Incidental lung nodules where found on Xray and CT chest/abd/pelv show poss RCC with lung mets. Oncology has been consulted   Subjective: Patient seen and examined, overnight was SOB and had difficulty with sleeping. Denies chest pain. Patient also c/o left shoulder and back pain. No acute events. Remains afebrile   Assessment & Plan: Symptomatic anemia  Unclear etiology at this time, he has history of iron deficiency anemia and iron is very low. Could be secondary to chronic kidney disease versus malignancy as there is poss RCC with lung mets Patient was transfused 2 units PRBCs. Hgb remains on 7.8 - will transfuse another unit, patient going for cardiac cath, Hgb preferred > 8  No overt bleeding, FOBT negative  IV iron given 1 Continue to monitor   NSTEMI 2/2 demand ischemia  In setting of severe anemia and history of CAD. EKG with ST segment depression in lateral leads Cardiology input appreciated Patient has been started on heparin and will need cardiac cath on some point. Echo worsened EF, wall motion unable to evaluate   Pulmonary nodule  Incidental findings, patient with history of smoking for 40 years 2 packs per days CT scan showing possible metastasis, and renal mass ? Primary lesion Oncology consulted   Renal Mass Incidental finding on CT  chest Concern for renal cell carcinoma CT abd/pelvis Renal mass strongly suggesting RCC  Oncology has been consulted  Likely contributing to anemia   DM type 2 - stable  Patient on metformin at home, will hold for now given AKI Placed on sliding scale and monitor CBG  AKI - ? CKD component as well unknown stage - Cr has improved  Patient was treated with aggressive hydration - IVF d/ced due to mild overload  Cr normalized  Check BMP in AM   Acute on Chronic systolic dysfunction EF 35-32% in 2014 - New ECHO with severe decrease EF 20-25%  Mild exacerbation due to aggressive hydration - SOB and bibasilar crackles  Will give one dose of Lasix now and another after transfusion  Continue management per cards   Hyperkalemia - Resolved  Was treated with Lasix and IV fluids Monitor   Back pain - MSK  OOB and pain control PRN   DVT prophylaxis: Heparin drip Code Status: Full code Family Communication: None at bedside Disposition Plan: Home when medically stable  Consultants:   Cardiology  Oncology   Procedures:   Echocardiogram 11/13/2011 ------------------------------------------------------------------- Study Conclusions  - Left ventricle: The cavity size was normal. There was mild focal   basal hypertrophy of the septum. Systolic function was severely   reduced. The estimated ejection fraction was in the range of 20%   to 25%. Images were inadequate for LV wall motion assessment. - Mitral valve: There was mild regurgitation. - Left atrium: The atrium was mildly to moderately dilated. - Right ventricle: The cavity size was mildly dilated. Wall   thickness was normal. - Tricuspid valve:  There was moderate regurgitation. - Pulmonary arteries: PA peak pressure: 52 mm Hg (S).  Impressions:  - The right ventricular systolic pressure was increased consistent   with moderate pulmonary hypertension.  Antimicrobials: Anti-infectives    None         Objective: Vitals:   11/12/16 2248 11/12/16 2346 11/13/16 0000 11/13/16 0400  BP: 110/71  92/62 (!) 96/54  Pulse: (!) 119  (!) 106 (!) 109  Resp: (!) 30  (!) 25 (!) 33  Temp:  97.7 F (36.5 C)  97.8 F (36.6 C)  TempSrc:  Oral  Oral  SpO2: 93%  93% 93%  Weight:      Height:        Intake/Output Summary (Last 24 hours) at 11/13/16 0814 Last data filed at 11/13/16 0500  Gross per 24 hour  Intake          1066.52 ml  Output             1275 ml  Net          -208.48 ml   Filed Weights   11/11/16 1353  Weight: 77.1 kg (170 lb)    Examination:  General exam: NAD   HEENT: Conjunctiva pale, No LAD  Respiratory system: Air entry diminished b/l, bibasilar crackles  Cardiovascular system: S1S2 RRR, no murmurs  Gastrointestinal system: Abd soft NTND  Central nervous system: AAOX3  Extremities: No LE edema    Skin: No rashes or lesions  Psychiatry: Mood and affect appropriate    Data Reviewed: I have personally reviewed following labs and imaging studies  CBC:  Recent Labs Lab 11/11/16 1418 11/12/16 0527 11/13/16 0054  WBC 12.9* 12.4* 12.3*  NEUTROABS  --  10.4* 9.8*  HGB 5.6* 7.8* 7.8*  HCT 18.9* 25.1* 24.6*  MCV 69.5* 71.5* 72.4*  PLT 353 328 294   Basic Metabolic Panel:  Recent Labs Lab 11/11/16 1418 11/12/16 0527 11/13/16 0054  NA 135 136 135  K 5.1 5.7* 4.9  CL 105 108 107  CO2 19* 20* 19*  GLUCOSE 154* 147* 163*  BUN 27* 28* 21*  CREATININE 1.76* 1.55* 1.15  CALCIUM 9.0 8.8* 9.0   GFR: Estimated Creatinine Clearance: 65.2 mL/min (by C-G formula based on SCr of 1.15 mg/dL). Liver Function Tests:  Recent Labs Lab 11/13/16 0054  AST 29  ALT 18  ALKPHOS 92  BILITOT 0.8  PROT 6.8  ALBUMIN 3.3*   No results for input(s): LIPASE, AMYLASE in the last 168 hours. No results for input(s): AMMONIA in the last 168 hours. Coagulation Profile:  Recent Labs Lab 11/12/16 0833  INR 1.13   Cardiac Enzymes:  Recent Labs Lab 11/11/16 1728  11/11/16 2320 11/12/16 0527 11/13/16 0054  TROPONINI 2.75* 2.14* 3.49* 3.14*   BNP (last 3 results) No results for input(s): PROBNP in the last 8760 hours. HbA1C: No results for input(s): HGBA1C in the last 72 hours. CBG:  Recent Labs Lab 11/11/16 1904 11/12/16 1154 11/12/16 1610 11/12/16 2118  GLUCAP 136* 214* 169* 209*   Lipid Profile: No results for input(s): CHOL, HDL, LDLCALC, TRIG, CHOLHDL, LDLDIRECT in the last 72 hours. Thyroid Function Tests: No results for input(s): TSH, T4TOTAL, FREET4, T3FREE, THYROIDAB in the last 72 hours. Anemia Panel:  Recent Labs  11/11/16 1518  FERRITIN 16*  TIBC 295  IRON 10*   Sepsis Labs: No results for input(s): PROCALCITON, LATICACIDVEN in the last 168 hours.  Recent Results (from the past 240 hour(s))  MRSA PCR Screening  Status: None   Collection Time: 11/12/16  2:01 PM  Result Value Ref Range Status   MRSA by PCR NEGATIVE NEGATIVE Final    Comment:        The GeneXpert MRSA Assay (FDA approved for NASAL specimens only), is one component of a comprehensive MRSA colonization surveillance program. It is not intended to diagnose MRSA infection nor to guide or monitor treatment for MRSA infections.       Radiology Studies: Ct Abdomen Pelvis Wo Contrast  Result Date: 11/12/2016 CLINICAL DATA:  Nausea and shortness of Breath EXAM: CT ABDOMEN AND PELVIS WITHOUT CONTRAST TECHNIQUE: Multidetector CT imaging of the abdomen and pelvis was performed following the standard protocol without IV contrast. COMPARISON:  None. FINDINGS: Lower chest: Small bilateral pleural effusions are noted. Mild bibasilar atelectatic changes are seen. There are nodular changes identified in the right lower lobe as well as some smaller scattered nodules seen. The largest of these measures 15 mm on image number 80 of series 3. These are similar to that seen on recent chest CT from earlier in the same day. Hepatobiliary: No focal liver abnormality is  seen. No gallstones, gallbladder wall thickening, or biliary dilatation. Pancreas: Unremarkable. No pancreatic ductal dilatation or surrounding inflammatory changes. Spleen: Normal in size without focal abnormality. Adrenals/Urinary Tract: The adrenal glands are within normal limits. The right kidney is unremarkable. The left kidney demonstrates a large mixed attenuation mass lesion which measures approximately the 11 cm in greatest dimension. This is similar to that seen on the prior CT of the chest. These changes are consistent with renal cell carcinoma. The bladder is within normal limits. Stomach/Bowel: Stomach is within normal limits. Appendix appears normal. No evidence of bowel wall thickening, distention, or inflammatory changes. Vascular/Lymphatic: Aortic atherosclerosis. No enlarged abdominal or pelvic lymph nodes. Reproductive: Prostate is enlarged in size. Other: No abdominal wall hernia or abnormality. No abdominopelvic ascites. Musculoskeletal: Degenerative changes of lumbar spine are noted. IMPRESSION: Large left renal mass consistent with renal cell carcinoma. There are changes consistent with metastatic disease in the lungs. Pleural effusions are also noted. Electronically Signed   By: Inez Catalina M.D.   On: 11/12/2016 16:52   Dg Chest 2 View  Result Date: 11/11/2016 CLINICAL DATA:  Shortness of breath and cough. EXAM: CHEST  2 VIEW COMPARISON:  08/07/2012 FINDINGS: Low volume film. Cardiopericardial silhouette is at upper limits of normal for size. 1 cm nodular density right upper lobe is new in the interval. No pulmonary edema or pleural effusion. Patient is status post CABG. IMPRESSION: Low volume film with new 10 mm right upper lobe pulmonary nodule. CT chest without contrast recommended to further evaluate. Electronically Signed   By: Misty Stanley M.D.   On: 11/11/2016 14:44   Ct Chest Wo Contrast  Result Date: 11/12/2016 CLINICAL DATA:  Abnormal chest radiograph, pulmonary nodule  EXAM: CT CHEST WITHOUT CONTRAST TECHNIQUE: Multidetector CT imaging of the chest was performed following the standard protocol without IV contrast. COMPARISON:  Chest radiographs dated 11/11/2016 FINDINGS: Cardiovascular: Heart is top-normal in size. No pericardial effusion. No evidence of thoracic aortic aneurysm. Atherosclerotic calcifications aortic arch. Three vessel coronary atherosclerosis. Postsurgical changes related to prior CABG. Mediastinum/Nodes: 15 mm short axis subcarinal node. Additional small mediastinal nodes which not meet pathologic CT size criteria. Visualized thyroid is unremarkable. Lungs/Pleura: Multiple bilateral pulmonary nodules, approximately 12-15 in number, most of which are in the right lung, suspicious for metastases. Dominant nodules include: --10 mm nodule in the posterior left upper  lobe (series 5/image 51) --19 mm nodule in the right upper lobe along the minor fissure (series 5/ image 60) --18 mm nodule in the posterior right lower lobe (series 5/ image 82) No focal consolidation. Mild patchy left lower lobe opacity, likely atelectasis. Small bilateral pleural effusions. No pneumothorax. Upper Abdomen: Visualized upper abdomen is notable for a 9.1 x 11.6 cm left renal mass, incompletely visualized/evaluated, compatible with renal cell carcinoma. Musculoskeletal: Mild degenerative changes the visualized thoracolumbar spine. Median sternotomy. No suspicious osseous lesions. IMPRESSION: Multiple bilateral pulmonary nodules, suspicious for metastases, measuring up to 19 mm in the right upper lobe. 15 mm short axis subcarinal node, indeterminate. 9.1 x 11.6 cm left renal mass, incompletely visualized/evaluated, compatible with renal cell carcinoma. This likely reflects the primary malignancy. Small bilateral pleural effusions. Aortic Atherosclerosis (ICD10-I70.0). Electronically Signed   By: Julian Hy M.D.   On: 11/12/2016 10:29     Scheduled Meds: . atorvastatin  80 mg  Oral q1800  . furosemide  20 mg Intravenous Once  . furosemide  20 mg Intravenous Once  . insulin aspart  0-15 Units Subcutaneous TID WC   Continuous Infusions: . sodium chloride    . sodium chloride    . heparin 1,400 Units/hr (11/13/16 0319)     LOS: 2 days    Time spent: Total of 35 minutes spent with pt, greater than 50% of which was spent in discussion of  treatment, counseling and coordination of care    Chipper Oman, MD Pager: Text Page via [email or web address]   If 7PM-7AM, please contact night-coverage [email or web address] 11/13/2016, 8:14 AM

## 2016-11-14 ENCOUNTER — Encounter (HOSPITAL_COMMUNITY)
Admission: EM | Disposition: A | Discharge status: Home / Self Care | Payer: Self-pay | Source: Home / Self Care | Attending: Family Medicine

## 2016-11-14 DIAGNOSIS — R000 Tachycardia, unspecified: Secondary | ICD-10-CM

## 2016-11-14 DIAGNOSIS — N2889 Other specified disorders of kidney and ureter: Secondary | ICD-10-CM

## 2016-11-14 DIAGNOSIS — I25810 Atherosclerosis of coronary artery bypass graft(s) without angina pectoris: Secondary | ICD-10-CM

## 2016-11-14 DIAGNOSIS — D500 Iron deficiency anemia secondary to blood loss (chronic): Secondary | ICD-10-CM

## 2016-11-14 HISTORY — PX: LEFT HEART CATH AND CORS/GRAFTS ANGIOGRAPHY: CATH118250

## 2016-11-14 LAB — GLUCOSE, CAPILLARY
GLUCOSE-CAPILLARY: 178 mg/dL — AB (ref 65–99)
Glucose-Capillary: 129 mg/dL — ABNORMAL HIGH (ref 65–99)
Glucose-Capillary: 112 mg/dL — ABNORMAL HIGH (ref 65–99)
Glucose-Capillary: 115 mg/dL — ABNORMAL HIGH (ref 65–99)

## 2016-11-14 LAB — BASIC METABOLIC PANEL
ANION GAP: 9 (ref 5–15)
BUN: 18 mg/dL (ref 6–20)
CALCIUM: 9 mg/dL (ref 8.9–10.3)
CO2: 22 mmol/L (ref 22–32)
Chloride: 103 mmol/L (ref 101–111)
Creatinine, Ser: 1.17 mg/dL (ref 0.61–1.24)
GFR calc Af Amer: >60 mL/min (ref >60)
GLUCOSE: 143 mg/dL — AB (ref 65–99)
Potassium: 4.5 mmol/L (ref 3.5–5.1)
SODIUM: 134 mmol/L — AB (ref 135–145)

## 2016-11-14 LAB — TYPE AND SCREEN
ABO/RH(D): O POS
Antibody Screen: NEGATIVE
UNIT DIVISION: 0
UNIT DIVISION: 0
Unit division: 0

## 2016-11-14 LAB — BPAM RBC
BLOOD PRODUCT EXPIRATION DATE: 201810022359
Blood Product Expiration Date: 201808302359
Blood Product Expiration Date: 201810022359
ISSUE DATE / TIME: 201808251926
ISSUE DATE / TIME: 201808270906
ISSUE DATE / TIME: 201808260021
UNIT TYPE AND RH: 5100
UNIT TYPE AND RH: 5100
Unit Type and Rh: 5100

## 2016-11-14 LAB — CBC
HCT: 28.9 % — ABNORMAL LOW (ref 39.0–52.0)
HCT: 28.9 % — ABNORMAL LOW (ref 39.0–52.0)
Hemoglobin: 9.1 g/dL — ABNORMAL LOW (ref 13.0–17.0)
Hemoglobin: 9 g/dL — ABNORMAL LOW (ref 13.0–17.0)
MCH: 23.2 pg — ABNORMAL LOW (ref 26.0–34.0)
MCH: 23.1 pg — AB (ref 26.0–34.0)
MCHC: 31.5 g/dL (ref 30.0–36.0)
MCHC: 31.1 g/dL (ref 30.0–36.0)
MCV: 73.5 fL — ABNORMAL LOW (ref 78.0–100.0)
MCV: 74.3 fL — AB (ref 78.0–100.0)
PLATELETS: 304 10*3/uL (ref 150–400)
Platelets: 294 10*3/uL (ref 150–400)
RBC: 3.93 MIL/uL — ABNORMAL LOW (ref 4.22–5.81)
RBC: 3.89 MIL/uL — ABNORMAL LOW (ref 4.22–5.81)
RDW: 19.8 % — AB (ref 11.5–15.5)
RDW: 20.2 % — AB (ref 11.5–15.5)
WBC: 9.6 10*3/uL (ref 4.0–10.5)
WBC: 9.8 10*3/uL (ref 4.0–10.5)

## 2016-11-14 LAB — HEPARIN LEVEL (UNFRACTIONATED): HEPARIN UNFRACTIONATED: 0.38 [IU]/mL (ref 0.30–0.70)

## 2016-11-14 LAB — CREATININE, SERUM
Creatinine, Ser: 1.06 mg/dL (ref 0.61–1.24)
GFR calc Af Amer: >60 mL/min (ref >60)
GFR calc non Af Amer: >60 mL/min (ref >60)

## 2016-11-14 LAB — PROTIME-INR
INR: 1.08
PROTHROMBIN TIME: 14.1 s (ref 11.4–15.2)

## 2016-11-14 SURGERY — LEFT HEART CATH AND CORS/GRAFTS ANGIOGRAPHY
Anesthesia: LOCAL

## 2016-11-14 MED ORDER — HEPARIN SODIUM (PORCINE) 1000 UNIT/ML IJ SOLN
INTRAMUSCULAR | Status: DC | PRN
  Administered 2016-11-14: 4000 [IU] via INTRAVENOUS

## 2016-11-14 MED ORDER — SODIUM CHLORIDE 0.9 % WEIGHT BASED INFUSION: 1.0000 mL/kg/h | INTRAVENOUS | Status: DC

## 2016-11-14 MED ORDER — SODIUM CHLORIDE 0.9 % IV SOLN
INTRAVENOUS | Status: AC
  Administered 2016-11-14: 18:00:00 via INTRAVENOUS

## 2016-11-14 MED ORDER — ONDANSETRON HCL 4 MG/2ML IJ SOLN
4.0000 mg | Freq: Four times a day (QID) | INTRAMUSCULAR | Status: DC | PRN
Start: 2016-11-14 — End: 2016-11-16

## 2016-11-14 MED ORDER — SODIUM CHLORIDE 0.9% FLUSH: 3.0000 mL | INTRAVENOUS | Status: DC | PRN

## 2016-11-14 MED ORDER — ASPIRIN 81 MG PO CHEW
81.0000 mg | CHEWABLE_TABLET | Freq: Every day | ORAL | Status: DC
  Administered 2016-11-15 – 2016-11-16 (×2): 81 mg via ORAL
  Filled 2016-11-14 (×2): qty 1

## 2016-11-14 MED ORDER — OXYCODONE HCL 5 MG PO TABS: 5.0000 mg | ORAL_TABLET | ORAL | Status: DC | PRN

## 2016-11-14 MED ORDER — HEPARIN SODIUM (PORCINE) 5000 UNIT/ML IJ SOLN
5000.0000 [IU] | Freq: Three times a day (TID) | INTRAMUSCULAR | Status: DC
  Administered 2016-11-15 – 2016-11-16 (×4): 5000 [IU] via SUBCUTANEOUS
  Filled 2016-11-14 (×3): qty 1

## 2016-11-14 MED ORDER — VERAPAMIL HCL 2.5 MG/ML IV SOLN
INTRAVENOUS | Status: AC
  Filled 2016-11-14: qty 2

## 2016-11-14 MED ORDER — CARVEDILOL 3.125 MG PO TABS
3.1250 mg | ORAL_TABLET | Freq: Two times a day (BID) | ORAL | Status: DC
Start: 2016-11-14 — End: 2016-11-16
  Administered 2016-11-14 – 2016-11-16 (×4): 3.125 mg via ORAL
  Filled 2016-11-14 (×4): qty 1

## 2016-11-14 MED ORDER — ASPIRIN 81 MG PO CHEW
81.0000 mg | CHEWABLE_TABLET | ORAL | Status: AC
  Administered 2016-11-14: 81 mg via ORAL
  Filled 2016-11-14: qty 1

## 2016-11-14 MED ORDER — FUROSEMIDE 10 MG/ML IJ SOLN
20.0000 mg | Freq: Once | INTRAMUSCULAR | Status: AC
  Administered 2016-11-14: 20 mg via INTRAVENOUS
  Filled 2016-11-14: qty 2

## 2016-11-14 MED ORDER — HEPARIN SODIUM (PORCINE) 1000 UNIT/ML IJ SOLN
INTRAMUSCULAR | Status: AC
  Filled 2016-11-14: qty 1

## 2016-11-14 MED ORDER — MIDAZOLAM HCL 2 MG/2ML IJ SOLN
INTRAMUSCULAR | Status: AC
  Filled 2016-11-14: qty 2

## 2016-11-14 MED ORDER — SODIUM CHLORIDE 0.9 % IV SOLN: 250.0000 mL | INTRAVENOUS | Status: DC | PRN

## 2016-11-14 MED ORDER — IOPAMIDOL (ISOVUE-370) INJECTION 76%
INTRAVENOUS | Status: AC
  Filled 2016-11-14: qty 125

## 2016-11-14 MED ORDER — SODIUM CHLORIDE 0.9% FLUSH
3.0000 mL | Freq: Two times a day (BID) | INTRAVENOUS | Status: DC
  Administered 2016-11-14: 3 mL via INTRAVENOUS

## 2016-11-14 MED ORDER — FENTANYL CITRATE (PF) 100 MCG/2ML IJ SOLN
INTRAMUSCULAR | Status: AC
  Filled 2016-11-14: qty 2

## 2016-11-14 MED ORDER — SODIUM CHLORIDE 0.9 % WEIGHT BASED INFUSION
1.0000 mL/kg/h | INTRAVENOUS | Status: DC
  Administered 2016-11-14 (×2): 1 mL/kg/h via INTRAVENOUS

## 2016-11-14 MED ORDER — SODIUM CHLORIDE 0.9% FLUSH: 3.0000 mL | Freq: Two times a day (BID) | INTRAVENOUS | Status: DC

## 2016-11-14 MED ORDER — ASPIRIN 81 MG PO CHEW: 81.0000 mg | CHEWABLE_TABLET | ORAL | Status: DC

## 2016-11-14 MED ORDER — IOPAMIDOL (ISOVUE-370) INJECTION 76%
INTRAVENOUS | Status: DC | PRN
  Administered 2016-11-14: 70 mL via INTRA_ARTERIAL

## 2016-11-14 MED ORDER — LIDOCAINE HCL (PF) 1 % IJ SOLN
INTRAMUSCULAR | Status: DC | PRN
  Administered 2016-11-14: 3 mL

## 2016-11-14 MED ORDER — SODIUM CHLORIDE 0.9 % WEIGHT BASED INFUSION: 3.0000 mL/kg/h | INTRAVENOUS | Status: DC

## 2016-11-14 MED ORDER — VERAPAMIL HCL 2.5 MG/ML IV SOLN
INTRAVENOUS | Status: DC | PRN
  Administered 2016-11-14: 7 mL via INTRA_ARTERIAL

## 2016-11-14 MED ORDER — MIDAZOLAM HCL 2 MG/2ML IJ SOLN
INTRAMUSCULAR | Status: DC | PRN
  Administered 2016-11-14 (×3): 0.5 mg via INTRAVENOUS

## 2016-11-14 MED ORDER — ACETAMINOPHEN 325 MG PO TABS: 650.0000 mg | ORAL_TABLET | ORAL | Status: DC | PRN

## 2016-11-14 MED ORDER — HEPARIN (PORCINE) IN NACL 2-0.9 UNIT/ML-% IJ SOLN
INTRAMUSCULAR | Status: AC
  Filled 2016-11-14: qty 1000

## 2016-11-14 MED ORDER — FENTANYL CITRATE (PF) 100 MCG/2ML IJ SOLN
INTRAMUSCULAR | Status: DC | PRN
  Administered 2016-11-14 (×2): 25 ug via INTRAVENOUS

## 2016-11-14 MED ORDER — HEPARIN (PORCINE) IN NACL 2-0.9 UNIT/ML-% IJ SOLN
INTRAMUSCULAR | Status: AC | PRN
  Administered 2016-11-14: 1000 mL via INTRA_ARTERIAL

## 2016-11-14 MED ORDER — LIDOCAINE HCL (PF) 1 % IJ SOLN
INTRAMUSCULAR | Status: AC
  Filled 2016-11-14: qty 30

## 2016-11-14 SURGICAL SUPPLY — 12 items
CATH INFINITI 5 FR LCB (CATHETERS) ×2 IMPLANT
CATH INFINITI 5FR JL4 (CATHETERS) ×2 IMPLANT
CATH INFINITI 5FR MPB2 (CATHETERS) ×2 IMPLANT
CATH INFINITI JR4 5F (CATHETERS) ×2 IMPLANT
DEVICE RAD COMP TR BAND LRG (VASCULAR PRODUCTS) ×2 IMPLANT
GLIDESHEATH SLEND A-KIT 6F 22G (SHEATH) ×2 IMPLANT
GUIDEWIRE INQWIRE 1.5J.035X260 (WIRE) ×1 IMPLANT
INQWIRE 1.5J .035X260CM (WIRE) ×2
KIT HEART LEFT (KITS) ×2 IMPLANT
PACK CARDIAC CATHETERIZATION (CUSTOM PROCEDURE TRAY) ×2 IMPLANT
TRANSDUCER W/STOPCOCK (MISCELLANEOUS) ×2 IMPLANT
TUBING CIL FLEX 10 FLL-RA (TUBING) ×2 IMPLANT

## 2016-11-14 NOTE — Progress Notes (Signed)
Received pt alert and oriented IVF infusing and pt denies any discomfort at this time.  Pt on monitor, no acute distress noted at this time.

## 2016-11-14 NOTE — H&P (View-Only) (Signed)
Progress Note  Patient Name: Trevor [name] Date of Encounter: 11/14/2016  Primary Cardiologist: Dr. Fletcher Anon  Subjective   No chest pain no SOB, feeling better.  Inpatient Medications    Scheduled Meds: . atorvastatin  80 mg Oral q1800  . carvedilol  3.125 mg Oral BID WC  . insulin aspart  0-15 Units Subcutaneous TID WC  . mouth rinse  15 mL Mouth Rinse BID  . sodium chloride flush  3 mL Intravenous Q12H   Continuous Infusions: . sodium chloride    . sodium chloride    . sodium chloride 1 mL/kg/hr (11/14/16 0653)  . heparin 1,700 Units/hr (11/14/16 0800)   PRN Meds: sodium chloride, ondansetron (ZOFRAN) IV, sodium chloride flush   Vital Signs    Vitals:   11/14/16 0400 11/14/16 0600 11/14/16 0755 11/14/16 0800  BP: 98/64 102/65  104/68  Pulse: 99 96  94  Resp: (!) 31 (!) 22  13  Temp:   97.9 F (36.6 C)   TempSrc:   Oral   SpO2: 94% 97%  100%  Weight:  174 lb 6.1 oz (79.1 kg)    Height:        Intake/Output Summary (Last 24 hours) at 11/14/16 0857 Last data filed at 11/14/16 0800  Gross per 24 hour  Intake          1444.36 ml  Output             2750 ml  Net         -1305.64 ml   Filed Weights   11/11/16 1353 11/14/16 0600  Weight: 170 lb (77.1 kg) 174 lb 6.1 oz (79.1 kg)    Telemetry    SR with brief PAT yesterday AM - Personally Reviewed  ECG    SR with lateral and lead I and II ST depression today - Personally Reviewed  Physical Exam   GEN: No acute distress.   Neck: No JVD Cardiac: RRR, no murmurs, rubs, or gallops.  Respiratory: Clear to auscultation bilaterally. GI: Soft, nontender, non-distended  MS: No edema; No deformity. Neuro:  Nonfocal  Psych: Normal affect   Labs    Chemistry Recent Labs Lab 11/12/16 0527 11/13/16 0054 11/14/16 0307  NA 136 135 134*  K 5.7* 4.9 4.5  CL 108 107 103  CO2 20* 19* 22  GLUCOSE 147* 163* 143*  BUN 28* 21* 18  CREATININE 1.55* 1.15 1.17  CALCIUM 8.8* 9.0 9.0  PROT  --  6.8  --   ALBUMIN   --  3.3*  --   AST  --  29  --   ALT  --  18  --   ALKPHOS  --  92  --   BILITOT  --  0.8  --   GFRNONAA 44* >60 >60  GFRAA 51* >60 >60  ANIONGAP 8 9 9      Hematology Recent Labs Lab 11/13/16 0054 11/13/16 1515 11/14/16 0307  WBC 12.3* 11.8* 9.6  RBC 3.40* 3.91* 3.93*  HGB 7.8* 9.0* 9.1*  HCT 24.6* 28.7* 28.9*  MCV 72.4* 73.4* 73.5*  MCH 22.9* 23.0* 23.2*  MCHC 31.7 31.4 31.5  RDW 20.1* 19.7* 19.8*  PLT 322 320 294    Cardiac Enzymes Recent Labs Lab 11/11/16 1728 11/11/16 2320 11/12/16 0527 11/13/16 0054  TROPONINI 2.75* 2.14* 3.49* 3.14*    Recent Labs Lab 11/11/16 1438  TROPIPOC 2.18*     BNPNo results for input(s): BNP, PROBNP in the last 168 hours.   DDimer  No results for input(s): DDIMER in the last 168 hours.   Radiology    Ct Abdomen Pelvis Wo Contrast  Result Date: 11/12/2016 CLINICAL DATA:  Nausea and shortness of Breath EXAM: CT ABDOMEN AND PELVIS WITHOUT CONTRAST TECHNIQUE: Multidetector CT imaging of the abdomen and pelvis was performed following the standard protocol without IV contrast. COMPARISON:  None. FINDINGS: Lower chest: Small bilateral pleural effusions are noted. Mild bibasilar atelectatic changes are seen. There are nodular changes identified in the right lower lobe as well as some smaller scattered nodules seen. The largest of these measures 15 mm on image number 80 of series 3. These are similar to that seen on recent chest CT from earlier in the same day. Hepatobiliary: No focal liver abnormality is seen. No gallstones, gallbladder wall thickening, or biliary dilatation. Pancreas: Unremarkable. No pancreatic ductal dilatation or surrounding inflammatory changes. Spleen: Normal in size without focal abnormality. Adrenals/Urinary Tract: The adrenal glands are within normal limits. The right kidney is unremarkable. The left kidney demonstrates a large mixed attenuation mass lesion which measures approximately the 11 cm in greatest dimension.  This is similar to that seen on the prior CT of the chest. These changes are consistent with renal cell carcinoma. The bladder is within normal limits. Stomach/Bowel: Stomach is within normal limits. Appendix appears normal. No evidence of bowel wall thickening, distention, or inflammatory changes. Vascular/Lymphatic: Aortic atherosclerosis. No enlarged abdominal or pelvic lymph nodes. Reproductive: Prostate is enlarged in size. Other: No abdominal wall hernia or abnormality. No abdominopelvic ascites. Musculoskeletal: Degenerative changes of lumbar spine are noted. IMPRESSION: Large left renal mass consistent with renal cell carcinoma. There are changes consistent with metastatic disease in the lungs. Pleural effusions are also noted. Electronically Signed   By: Inez Catalina M.D.   On: 11/12/2016 16:52   Ct Chest Wo Contrast  Result Date: 11/12/2016 CLINICAL DATA:  Abnormal chest radiograph, pulmonary nodule EXAM: CT CHEST WITHOUT CONTRAST TECHNIQUE: Multidetector CT imaging of the chest was performed following the standard protocol without IV contrast. COMPARISON:  Chest radiographs dated 11/11/2016 FINDINGS: Cardiovascular: Heart is top-normal in size. No pericardial effusion. No evidence of thoracic aortic aneurysm. Atherosclerotic calcifications aortic arch. Three vessel coronary atherosclerosis. Postsurgical changes related to prior CABG. Mediastinum/Nodes: 15 mm short axis subcarinal node. Additional small mediastinal nodes which not meet pathologic CT size criteria. Visualized thyroid is unremarkable. Lungs/Pleura: Multiple bilateral pulmonary nodules, approximately 12-15 in number, most of which are in the right lung, suspicious for metastases. Dominant nodules include: --10 mm nodule in the posterior left upper lobe (series 5/image 51) --19 mm nodule in the right upper lobe along the minor fissure (series 5/ image 60) --18 mm nodule in the posterior right lower lobe (series 5/ image 82) No focal  consolidation. Mild patchy left lower lobe opacity, likely atelectasis. Small bilateral pleural effusions. No pneumothorax. Upper Abdomen: Visualized upper abdomen is notable for a 9.1 x 11.6 cm left renal mass, incompletely visualized/evaluated, compatible with renal cell carcinoma. Musculoskeletal: Mild degenerative changes the visualized thoracolumbar spine. Median sternotomy. No suspicious osseous lesions. IMPRESSION: Multiple bilateral pulmonary nodules, suspicious for metastases, measuring up to 19 mm in the right upper lobe. 15 mm short axis subcarinal node, indeterminate. 9.1 x 11.6 cm left renal mass, incompletely visualized/evaluated, compatible with renal cell carcinoma. This likely reflects the primary malignancy. Small bilateral pleural effusions. Aortic Atherosclerosis (ICD10-I70.0). Electronically Signed   By: Julian Hy M.D.   On: 11/12/2016 10:29    Cardiac Studies   11/12/16  Echo Study Conclusions  - Left ventricle: The cavity size was normal. There was mild focal   basal hypertrophy of the septum. Systolic function was severely   reduced. The estimated ejection fraction was in the range of 20%   to 25%. Images were inadequate for LV wall motion assessment. - Mitral valve: There was mild regurgitation. - Left atrium: The atrium was mildly to moderately dilated. - Right ventricle: The cavity size was mildly dilated. Wall   thickness was normal. - Tricuspid valve: There was moderate regurgitation. - Pulmonary arteries: PA peak pressure: 52 mm Hg (S).  Impressions:  - The right ventricular systolic pressure was increased consistent   with moderate pulmonary hypertension.  Recommendations:  Limited study with definity contrast to evaluate wall motion and rule out apical thrombus.   Patient Profile     71 y.o. male with hx CAD, hx ant MI 2014 and CABG 2014, DM, ICM now with EF up to 53% in 2016, HLD now presenting with with SOB with Hgb 5.6, and iron def anemia.   + troponin and ST depression in lat. Leads consistent with ischemia.     Assessment & Plan    NSTEMI troponin 3.49, and with Hgb at 5.6, EKG with lat ischemia, IV heparin infusing for cath Lt radial site today.   Depending on disease may need diagnostic only with pending biopsy vs. nephrectomy   Profound anemia and Iron of 10, transfused. Now Hgb 9.1   Left renal mass - oncology has seen and multiple bilat pulmonary nodules.  Urology to see to eval for nephrectomy or biopsy   Hypotension improved 98/64 to 104/68   ICM now with EF 20-25%   Mild MR mod TR, PA pk pressure 52 mmHg.  On echo recommendation to do limited study with definity contrast to eval wall motion and rule out apical thrombus, on BB to be started today, BP is still soft.   DM per IM + SSI  CKD Cr today 1.17  HLD on statin   Signed, Cecilie Kicks, NP  11/14/2016, 8:57 AM    Patient examined chart reviewed no chest pain a bit tachycardic will increase coreg. Distant CABG with SEMI And need for Rx newly diagnosed left renal cell carcinoma Hct stable 28 .9 post transfusion Exam with clear lungs No murmur and good left radial pulse  For cath Dr Tamala Julian at [facility] today  Jenkins Rouge

## 2016-11-14 NOTE — Interval H&P Note (Signed)
Cath Lab Visit (complete for each Cath Lab visit)  Clinical Evaluation Leading to the Procedure:   ACS: Yes.    Non-ACS:    Anginal Classification: CCS [name]  Anti-ischemic medical therapy: Minimal Therapy (1 class of medications)  Non-Invasive Test Results: No non-invasive testing performed  Prior CABG: No previous CABG      History and Physical Interval Note:  11/14/2016 1:31 PM  Trevor [name]  has presented today for surgery, with the diagnosis of n stemi - cm  The various methods of treatment have been discussed with the patient and family. After consideration of risks, benefits and other options for treatment, the patient has consented to  Procedure(s): LEFT HEART CATH AND CORS/GRAFTS ANGIOGRAPHY (N/A) as a surgical intervention .  The patient's history has been reviewed, patient examined, no change in status, stable for surgery.  I have reviewed the patient's chart and labs.  Questions were answered to the patient's satisfaction.     Trevor [name]

## 2016-11-14 NOTE — Progress Notes (Signed)
PROGRESS NOTE [facility]   Haydn Hutsell   UMP:[id number] DOB: 07/21/1945  DOA: 11/11/2016 PCP: [facility] [place]   Brief Narrative:  Trevor [name] is a 71 year old male with medical history significant for CAD, systolic heart failure with ejection fraction 35-40%, diabetes type 2, hypertension and history of MI in 2014. Presented to the emergency department complaining of shortness of breath and weakness. On ED evaluation he was found to have hemoglobin of 5.6. EKG showed lateral ST segment depression with elevated troponin. Patient was admitted for blood transfusion and cardiology evaluation. Incidental lung nodules where found on Xray and CT chest/abd/pelv show poss RCC with lung mets. Oncology has been consulted, recommended urology eval for possible nephrectomy. Patient for cardiac cath today.   Subjective: No acute complaints. No chest pain. Breathing continues to improve. Hemoglobin stable. Afebrile.   Assessment & Plan: Symptomatic anemia - Hgb stable  Unclear etiology at this time, he has history of iron deficiency anemia and iron is very low. Could be secondary to chronic kidney disease versus malignancy as there is poss RCC with lung mets  S/p 3 PRBC's during hospital stay and 1 IV iron infusion. To keep Hgb > in view of cardiac cath  No overt bleeding, FOBT negative  Check cbc in AM    NSTEMI 2/2 demand ischemia  In setting of severe anemia and history of CAD. EKG with ST segment depression in lateral leads For cardiac cath today t. Echo worsened EF, wall motion unable to evaluate  I have resume Coreg as patient continues to be tachycardic   Pulmonary nodule  Incidental findings, patient with history of smoking for 40 years 2 packs per days CT scan showing possible metastasis, and renal mass ? Primary lesion Oncology input appreciated   Renal Mass Incidental finding on CT chest Concern for renal cell carcinoma CT abd/pelvis Renal mass  strongly suggesting RCC  Oncology recommending - urology eval for possible nephrectomy  Likely contributing to anemia   DM type 2 - stable  Patient on metformin at home, will hold for now given AKI Placed on sliding scale and monitor CBG  AKI - ? CKD component as well  unknown stage - Cr stable  Patient was treated with aggressive hydration - IVF d/ced due to mild overload  Cr at baseline  Monitor kidney function in AM   Acute on Chronic systolic dysfunction EF 40-08% in 2014 - New ECHO with severe decrease EF 20-25%  Mild exacerbation due to aggressive hydration - SOB and bibasilar crackles  Coreg resumed - further management per card recommendations  Another dose of Lasix given this AM   Hyperkalemia - Resolved  Was treated with Lasix and IV fluids  Back pain - MSK  OOB and pain control PRN   DVT prophylaxis: Heparin drip Code Status: Full code Family Communication: None at bedside Disposition Plan: Home when medically stable  Consultants:   Cardiology  Oncology   Procedures:   Echocardiogram 11/13/2011 ------------------------------------------------------------------- Study Conclusions  - Left ventricle: The cavity size was normal. There was mild focal   basal hypertrophy of the septum. Systolic function was severely   reduced. The estimated ejection fraction was in the range of 20%   to 25%. Images were inadequate for LV wall motion assessment. - Mitral valve: There was mild regurgitation. - Left atrium: The atrium was mildly to moderately dilated. - Right ventricle: The cavity size was mildly dilated. Wall   thickness was normal. - Tricuspid valve: There was  moderate regurgitation. - Pulmonary arteries: PA peak pressure: 52 mm Hg (S).  Impressions:  - The right ventricular systolic pressure was increased consistent   with moderate pulmonary hypertension.  Antimicrobials: Anti-infectives    None        Objective: Vitals:   11/14/16 0400  11/14/16 0600 11/14/16 0755 11/14/16 0800  BP: 98/64 102/65  104/68  Pulse: 99 96  94  Resp: (!) 31 (!) 22  13  Temp:   97.9 F (36.6 C)   TempSrc:   Oral   SpO2: 94% 97%  100%  Weight:  79.1 kg (174 lb 6.1 oz)    Height:        Intake/Output Summary (Last 24 hours) at 11/14/16 0833 Last data filed at 11/14/16 0800  Gross per 24 hour  Intake          1444.36 ml  Output             2750 ml  Net         -1305.64 ml   Filed Weights   11/11/16 1353 11/14/16 0600  Weight: 77.1 kg (170 lb) 79.1 kg (174 lb 6.1 oz)    Examination:  General: Pt is alert, awake, not in acute distress Cardiovascular: RRR, S1/S2 +, no rubs, no gallops, no JVD  Respiratory: Good air entry, bibasilar crackles. No wheezing or rales  Abdominal: Soft, NT, ND, bowel sounds + Extremities: no edema  Data Reviewed: I have personally reviewed following labs and imaging studies  CBC:  Recent Labs Lab 11/11/16 1418 11/12/16 0527 11/13/16 0054 11/13/16 1515 11/14/16 0307  WBC 12.9* 12.4* 12.3* 11.8* 9.6  NEUTROABS  --  10.4* 9.8*  --   --   HGB 5.6* 7.8* 7.8* 9.0* 9.1*  HCT 18.9* 25.1* 24.6* 28.7* 28.9*  MCV 69.5* 71.5* 72.4* 73.4* 73.5*  PLT 353 328 322 320 627   Basic Metabolic Panel:  Recent Labs Lab 11/11/16 1418 11/12/16 0527 11/13/16 0054 11/14/16 0307  NA 135 136 135 134*  K 5.1 5.7* 4.9 4.5  CL 105 108 107 103  CO2 19* 20* 19* 22  GLUCOSE 154* 147* 163* 143*  BUN 27* 28* 21* 18  CREATININE 1.76* 1.55* 1.15 1.17  CALCIUM 9.0 8.8* 9.0 9.0   GFR: Estimated Creatinine Clearance: 64.5 mL/min (by C-G formula based on SCr of 1.17 mg/dL). Liver Function Tests:  Recent Labs Lab 11/13/16 0054  AST 29  ALT 18  ALKPHOS 92  BILITOT 0.8  PROT 6.8  ALBUMIN 3.3*   No results for input(s): LIPASE, AMYLASE in the last 168 hours. No results for input(s): AMMONIA in the last 168 hours. Coagulation Profile:  Recent Labs Lab 11/12/16 0833  INR 1.13   Cardiac Enzymes:  Recent  Labs Lab 11/11/16 1728 11/11/16 2320 11/12/16 0527 11/13/16 0054  TROPONINI 2.75* 2.14* 3.49* 3.14*   BNP (last 3 results) No results for input(s): PROBNP in the last 8760 hours. HbA1C: No results for input(s): HGBA1C in the last 72 hours. CBG:  Recent Labs Lab 11/12/16 2118 11/13/16 0818 11/13/16 1223 11/13/16 1635 11/13/16 2109  GLUCAP 209* 139* 140* 112* 194*   Lipid Profile: No results for input(s): CHOL, HDL, LDLCALC, TRIG, CHOLHDL, LDLDIRECT in the last 72 hours. Thyroid Function Tests: No results for input(s): TSH, T4TOTAL, FREET4, T3FREE, THYROIDAB in the last 72 hours. Anemia Panel:  Recent Labs  11/11/16 1518  FERRITIN 16*  TIBC 295  IRON 10*   Sepsis Labs: No results for input(s): PROCALCITON, LATICACIDVEN  in the last 168 hours.  Recent Results (from the past 240 hour(s))  MRSA PCR Screening     Status: None   Collection Time: 11/12/16  2:01 PM  Result Value Ref Range Status   MRSA by PCR NEGATIVE NEGATIVE Final    Comment:        The GeneXpert MRSA Assay (FDA approved for NASAL specimens only), is one component of a comprehensive MRSA colonization surveillance program. It is not intended to diagnose MRSA infection nor to guide or monitor treatment for MRSA infections.       Radiology Studies: Ct Abdomen Pelvis Wo Contrast  Result Date: 11/12/2016 CLINICAL DATA:  Nausea and shortness of Breath EXAM: CT ABDOMEN AND PELVIS WITHOUT CONTRAST TECHNIQUE: Multidetector CT imaging of the abdomen and pelvis was performed following the standard protocol without IV contrast. COMPARISON:  None. FINDINGS: Lower chest: Small bilateral pleural effusions are noted. Mild bibasilar atelectatic changes are seen. There are nodular changes identified in the right lower lobe as well as some smaller scattered nodules seen. The largest of these measures 15 mm on image number 80 of series 3. These are similar to that seen on recent chest CT from earlier in the same day.  Hepatobiliary: No focal liver abnormality is seen. No gallstones, gallbladder wall thickening, or biliary dilatation. Pancreas: Unremarkable. No pancreatic ductal dilatation or surrounding inflammatory changes. Spleen: Normal in size without focal abnormality. Adrenals/Urinary Tract: The adrenal glands are within normal limits. The right kidney is unremarkable. The left kidney demonstrates a large mixed attenuation mass lesion which measures approximately the 11 cm in greatest dimension. This is similar to that seen on the prior CT of the chest. These changes are consistent with renal cell carcinoma. The bladder is within normal limits. Stomach/Bowel: Stomach is within normal limits. Appendix appears normal. No evidence of bowel wall thickening, distention, or inflammatory changes. Vascular/Lymphatic: Aortic atherosclerosis. No enlarged abdominal or pelvic lymph nodes. Reproductive: Prostate is enlarged in size. Other: No abdominal wall hernia or abnormality. No abdominopelvic ascites. Musculoskeletal: Degenerative changes of lumbar spine are noted. IMPRESSION: Large left renal mass consistent with renal cell carcinoma. There are changes consistent with metastatic disease in the lungs. Pleural effusions are also noted. Electronically Signed   By: Inez Catalina M.D.   On: 11/12/2016 16:52   Ct Chest Wo Contrast  Result Date: 11/12/2016 CLINICAL DATA:  Abnormal chest radiograph, pulmonary nodule EXAM: CT CHEST WITHOUT CONTRAST TECHNIQUE: Multidetector CT imaging of the chest was performed following the standard protocol without IV contrast. COMPARISON:  Chest radiographs dated 11/11/2016 FINDINGS: Cardiovascular: Heart is top-normal in size. No pericardial effusion. No evidence of thoracic aortic aneurysm. Atherosclerotic calcifications aortic arch. Three vessel coronary atherosclerosis. Postsurgical changes related to prior CABG. Mediastinum/Nodes: 15 mm short axis subcarinal node. Additional small mediastinal  nodes which not meet pathologic CT size criteria. Visualized thyroid is unremarkable. Lungs/Pleura: Multiple bilateral pulmonary nodules, approximately 12-15 in number, most of which are in the right lung, suspicious for metastases. Dominant nodules include: --10 mm nodule in the posterior left upper lobe (series 5/image 51) --19 mm nodule in the right upper lobe along the minor fissure (series 5/ image 60) --18 mm nodule in the posterior right lower lobe (series 5/ image 82) No focal consolidation. Mild patchy left lower lobe opacity, likely atelectasis. Small bilateral pleural effusions. No pneumothorax. Upper Abdomen: Visualized upper abdomen is notable for a 9.1 x 11.6 cm left renal mass, incompletely visualized/evaluated, compatible with renal cell carcinoma. Musculoskeletal: Mild degenerative changes  the visualized thoracolumbar spine. Median sternotomy. No suspicious osseous lesions. IMPRESSION: Multiple bilateral pulmonary nodules, suspicious for metastases, measuring up to 19 mm in the right upper lobe. 15 mm short axis subcarinal node, indeterminate. 9.1 x 11.6 cm left renal mass, incompletely visualized/evaluated, compatible with renal cell carcinoma. This likely reflects the primary malignancy. Small bilateral pleural effusions. Aortic Atherosclerosis (ICD10-I70.0). Electronically Signed   By: Julian Hy M.D.   On: 11/12/2016 10:29    Scheduled Meds: . atorvastatin  80 mg Oral q1800  . carvedilol  3.125 mg Oral BID  . furosemide  20 mg Intravenous Once  . insulin aspart  0-15 Units Subcutaneous TID WC  . mouth rinse  15 mL Mouth Rinse BID  . sodium chloride flush  3 mL Intravenous Q12H   Continuous Infusions: . sodium chloride    . sodium chloride    . sodium chloride 1 mL/kg/hr (11/14/16 0653)  . heparin 1,700 Units/hr (11/14/16 0800)     LOS: 3 days    Time spent: Total of 25 minutes spent with pt, greater than 50% of which was spent in discussion of  treatment, counseling  and coordination of care   Chipper Oman, MD Pager: Text Page via [email or web address]   If 7PM-7AM, please contact night-coverage [email or web address] 11/14/2016, 8:33 AM

## 2016-11-14 NOTE — Progress Notes (Signed)
Pt to cath lab procedure

## 2016-11-14 NOTE — Progress Notes (Signed)
ANTICOAGULATION CONSULT NOTE - Follow Up Consult  Pharmacy Consult for Heparin Indication: chest pain/ACS  No Known Allergies  Patient Measurements: Height: 6' (182.9 cm) Weight: 170 lb (77.1 kg) IBW/kg (Calculated) : 77.6 Heparin Dosing Weight:   Vital Signs: Temp: 97.9 F (36.6 C) (08/27 2327) Temp Source: Oral (08/27 2327) BP: 106/65 (08/27 2200) Pulse Rate: 116 (08/27 2200)  Labs:  Recent Labs  11/11/16 1418  11/11/16 2320 11/12/16 0527 11/12/16 0833  11/13/16 0054 11/13/16 1515 11/13/16 2339  HGB 5.6*  --   --  7.8*  --   --  7.8* 9.0*  --   HCT 18.9*  --   --  25.1*  --   --  24.6* 28.7*  --   PLT 353  --   --  328  --   --  322 320  --   APTT  --   --   --   --  35  --   --   --   --   LABPROT  --   --   --   --  14.5  --   --   --   --   INR  --   --   --   --  1.13  --   --   --   --   HEPARINUNFRC  --   --   --   --   --   < > <0.10* <0.10* 0.26*  CREATININE 1.76*  --   --  1.55*  --   --  1.15  --   --   TROPONINI  --   < > 2.14* 3.49*  --   --  3.14*  --   --   < > = values in this interval not displayed.  Estimated Creatinine Clearance: 65.2 mL/min (by C-G formula based on SCr of 1.15 mg/dL).   Medications:  Infusions:  . sodium chloride    . heparin 1,600 Units/hr (11/13/16 2000)    Assessment: Patient with low heparin level.  No heparin issues per RN.  Goal of Therapy:  Heparin level 0.3-0.7 units/ml Monitor platelets by anticoagulation protocol: Yes   Plan:  Increase heparin to 1700 units/hr Recheck level at 0900  Tyler Deis, Shea Stakes [place] 11/14/2016,12:57 AM

## 2016-11-14 NOTE — Progress Notes (Signed)
Progress Note  Patient Name: Trevor [name] Date of Encounter: 11/14/2016  Primary Cardiologist: Dr. Fletcher Anon  Subjective   No chest pain no SOB, feeling better.  Inpatient Medications    Scheduled Meds: . atorvastatin  80 mg Oral q1800  . carvedilol  3.125 mg Oral BID WC  . insulin aspart  0-15 Units Subcutaneous TID WC  . mouth rinse  15 mL Mouth Rinse BID  . sodium chloride flush  3 mL Intravenous Q12H   Continuous Infusions: . sodium chloride    . sodium chloride    . sodium chloride 1 mL/kg/hr (11/14/16 0653)  . heparin 1,700 Units/hr (11/14/16 0800)   PRN Meds: sodium chloride, ondansetron (ZOFRAN) IV, sodium chloride flush   Vital Signs    Vitals:   11/14/16 0400 11/14/16 0600 11/14/16 0755 11/14/16 0800  BP: 98/64 102/65  104/68  Pulse: 99 96  94  Resp: (!) 31 (!) 22  13  Temp:   97.9 F (36.6 C)   TempSrc:   Oral   SpO2: 94% 97%  100%  Weight:  174 lb 6.1 oz (79.1 kg)    Height:        Intake/Output Summary (Last 24 hours) at 11/14/16 0857 Last data filed at 11/14/16 0800  Gross per 24 hour  Intake          1444.36 ml  Output             2750 ml  Net         -1305.64 ml   Filed Weights   11/11/16 1353 11/14/16 0600  Weight: 170 lb (77.1 kg) 174 lb 6.1 oz (79.1 kg)    Telemetry    SR with brief PAT yesterday AM - Personally Reviewed  ECG    SR with lateral and lead I and II ST depression today - Personally Reviewed  Physical Exam   GEN: No acute distress.   Neck: No JVD Cardiac: RRR, no murmurs, rubs, or gallops.  Respiratory: Clear to auscultation bilaterally. GI: Soft, nontender, non-distended  MS: No edema; No deformity. Neuro:  Nonfocal  Psych: Normal affect   Labs    Chemistry Recent Labs Lab 11/12/16 0527 11/13/16 0054 11/14/16 0307  NA 136 135 134*  K 5.7* 4.9 4.5  CL 108 107 103  CO2 20* 19* 22  GLUCOSE 147* 163* 143*  BUN 28* 21* 18  CREATININE 1.55* 1.15 1.17  CALCIUM 8.8* 9.0 9.0  PROT  --  6.8  --   ALBUMIN   --  3.3*  --   AST  --  29  --   ALT  --  18  --   ALKPHOS  --  92  --   BILITOT  --  0.8  --   GFRNONAA 44* >60 >60  GFRAA 51* >60 >60  ANIONGAP 8 9 9      Hematology Recent Labs Lab 11/13/16 0054 11/13/16 1515 11/14/16 0307  WBC 12.3* 11.8* 9.6  RBC 3.40* 3.91* 3.93*  HGB 7.8* 9.0* 9.1*  HCT 24.6* 28.7* 28.9*  MCV 72.4* 73.4* 73.5*  MCH 22.9* 23.0* 23.2*  MCHC 31.7 31.4 31.5  RDW 20.1* 19.7* 19.8*  PLT 322 320 294    Cardiac Enzymes Recent Labs Lab 11/11/16 1728 11/11/16 2320 11/12/16 0527 11/13/16 0054  TROPONINI 2.75* 2.14* 3.49* 3.14*    Recent Labs Lab 11/11/16 1438  TROPIPOC 2.18*     BNPNo results for input(s): BNP, PROBNP in the last 168 hours.   DDimer  No results for input(s): DDIMER in the last 168 hours.   Radiology    Ct Abdomen Pelvis Wo Contrast  Result Date: 11/12/2016 CLINICAL DATA:  Nausea and shortness of Breath EXAM: CT ABDOMEN AND PELVIS WITHOUT CONTRAST TECHNIQUE: Multidetector CT imaging of the abdomen and pelvis was performed following the standard protocol without IV contrast. COMPARISON:  None. FINDINGS: Lower chest: Small bilateral pleural effusions are noted. Mild bibasilar atelectatic changes are seen. There are nodular changes identified in the right lower lobe as well as some smaller scattered nodules seen. The largest of these measures 15 mm on image number 80 of series 3. These are similar to that seen on recent chest CT from earlier in the same day. Hepatobiliary: No focal liver abnormality is seen. No gallstones, gallbladder wall thickening, or biliary dilatation. Pancreas: Unremarkable. No pancreatic ductal dilatation or surrounding inflammatory changes. Spleen: Normal in size without focal abnormality. Adrenals/Urinary Tract: The adrenal glands are within normal limits. The right kidney is unremarkable. The left kidney demonstrates a large mixed attenuation mass lesion which measures approximately the 11 cm in greatest dimension.  This is similar to that seen on the prior CT of the chest. These changes are consistent with renal cell carcinoma. The bladder is within normal limits. Stomach/Bowel: Stomach is within normal limits. Appendix appears normal. No evidence of bowel wall thickening, distention, or inflammatory changes. Vascular/Lymphatic: Aortic atherosclerosis. No enlarged abdominal or pelvic lymph nodes. Reproductive: Prostate is enlarged in size. Other: No abdominal wall hernia or abnormality. No abdominopelvic ascites. Musculoskeletal: Degenerative changes of lumbar spine are noted. IMPRESSION: Large left renal mass consistent with renal cell carcinoma. There are changes consistent with metastatic disease in the lungs. Pleural effusions are also noted. Electronically Signed   By: Inez Catalina M.D.   On: 11/12/2016 16:52   Ct Chest Wo Contrast  Result Date: 11/12/2016 CLINICAL DATA:  Abnormal chest radiograph, pulmonary nodule EXAM: CT CHEST WITHOUT CONTRAST TECHNIQUE: Multidetector CT imaging of the chest was performed following the standard protocol without IV contrast. COMPARISON:  Chest radiographs dated 11/11/2016 FINDINGS: Cardiovascular: Heart is top-normal in size. No pericardial effusion. No evidence of thoracic aortic aneurysm. Atherosclerotic calcifications aortic arch. Three vessel coronary atherosclerosis. Postsurgical changes related to prior CABG. Mediastinum/Nodes: 15 mm short axis subcarinal node. Additional small mediastinal nodes which not meet pathologic CT size criteria. Visualized thyroid is unremarkable. Lungs/Pleura: Multiple bilateral pulmonary nodules, approximately 12-15 in number, most of which are in the right lung, suspicious for metastases. Dominant nodules include: --10 mm nodule in the posterior left upper lobe (series 5/image 51) --19 mm nodule in the right upper lobe along the minor fissure (series 5/ image 60) --18 mm nodule in the posterior right lower lobe (series 5/ image 82) No focal  consolidation. Mild patchy left lower lobe opacity, likely atelectasis. Small bilateral pleural effusions. No pneumothorax. Upper Abdomen: Visualized upper abdomen is notable for a 9.1 x 11.6 cm left renal mass, incompletely visualized/evaluated, compatible with renal cell carcinoma. Musculoskeletal: Mild degenerative changes the visualized thoracolumbar spine. Median sternotomy. No suspicious osseous lesions. IMPRESSION: Multiple bilateral pulmonary nodules, suspicious for metastases, measuring up to 19 mm in the right upper lobe. 15 mm short axis subcarinal node, indeterminate. 9.1 x 11.6 cm left renal mass, incompletely visualized/evaluated, compatible with renal cell carcinoma. This likely reflects the primary malignancy. Small bilateral pleural effusions. Aortic Atherosclerosis (ICD10-I70.0). Electronically Signed   By: Julian Hy M.D.   On: 11/12/2016 10:29    Cardiac Studies   11/12/16  Echo Study Conclusions  - Left ventricle: The cavity size was normal. There was mild focal   basal hypertrophy of the septum. Systolic function was severely   reduced. The estimated ejection fraction was in the range of 20%   to 25%. Images were inadequate for LV wall motion assessment. - Mitral valve: There was mild regurgitation. - Left atrium: The atrium was mildly to moderately dilated. - Right ventricle: The cavity size was mildly dilated. Wall   thickness was normal. - Tricuspid valve: There was moderate regurgitation. - Pulmonary arteries: PA peak pressure: 52 mm Hg (S).  Impressions:  - The right ventricular systolic pressure was increased consistent   with moderate pulmonary hypertension.  Recommendations:  Limited study with definity contrast to evaluate wall motion and rule out apical thrombus.   Patient Profile     71 y.o. male with hx CAD, hx ant MI 2014 and CABG 2014, DM, ICM now with EF up to 53% in 2016, HLD now presenting with with SOB with Hgb 5.6, and iron def anemia.   + troponin and ST depression in lat. Leads consistent with ischemia.     Assessment & Plan    NSTEMI troponin 3.49, and with Hgb at 5.6, EKG with lat ischemia, IV heparin infusing for cath Lt radial site today.   Depending on disease may need diagnostic only with pending biopsy vs. nephrectomy   Profound anemia and Iron of 10, transfused. Now Hgb 9.1   Left renal mass - oncology has seen and multiple bilat pulmonary nodules.  Urology to see to eval for nephrectomy or biopsy   Hypotension improved 98/64 to 104/68   ICM now with EF 20-25%   Mild MR mod TR, PA pk pressure 52 mmHg.  On echo recommendation to do limited study with definity contrast to eval wall motion and rule out apical thrombus, on BB to be started today, BP is still soft.   DM per IM + SSI  CKD Cr today 1.17  HLD on statin   Signed, Cecilie Kicks, NP  11/14/2016, 8:57 AM    Patient examined chart reviewed no chest pain a bit tachycardic will increase coreg. Distant CABG with SEMI And need for Rx newly diagnosed left renal cell carcinoma Hct stable 28 .9 post transfusion Exam with clear lungs No murmur and good left radial pulse  For cath Dr Tamala Julian at [facility] today  Jenkins Rouge

## 2016-11-14 NOTE — Progress Notes (Signed)
ANTICOAGULATION CONSULT NOTE - follow up  Pharmacy Consult for Heparin Indication: chest pain/ACS  No Known Allergies  Patient Measurements: Height: 6' (182.9 cm) Weight: 174 lb 6.1 oz (79.1 kg) IBW/kg (Calculated) : 77.6 Heparin Dosing Weight: actual weight  Vital Signs: Temp: 97.9 F (36.6 C) (08/28 0755) Temp Source: Oral (08/28 0755) BP: 104/68 (08/28 0800) Pulse Rate: 94 (08/28 0800)  Labs:  Recent Labs  11/11/16 2320 11/12/16 0527 11/12/16 2229  11/13/16 0054 11/13/16 1515 11/13/16 2339 11/14/16 0307 11/14/16 0916  HGB  --  7.8*  --   --  7.8* 9.0*  --  9.1*  --   HCT  --  25.1*  --   --  24.6* 28.7*  --  28.9*  --   PLT  --  328  --   --  322 320  --  294  --   APTT  --   --  35  --   --   --   --   --   --   LABPROT  --   --  14.5  --   --   --   --   --  14.1  INR  --   --  1.13  --   --   --   --   --  1.08  HEPARINUNFRC  --   --   --   < > <0.10* <0.10* 0.26*  --  0.38  CREATININE  --  1.55*  --   --  1.15  --   --  1.17  --   TROPONINI 2.14* 3.49*  --   --  3.14*  --   --   --   --   < > = values in this interval not displayed.  Estimated Creatinine Clearance: 64.5 mL/min (by C-G formula based on SCr of 1.17 mg/dL).   Medical History: Past Medical History:  Diagnosis Date  . Anemia   . Coronary artery disease    06/2012: Anterior ST elevation myocardial infarction. Cardiac catheterization showed significant three-vessel coronary artery disease, ejection fraction 35-40%. The patient underwent CABG with LIMA to LAD, SVG to ramus and sequential SVG to right PDA/PL  . Diabetes mellitus without complication ([place])   . Hyperlipidemia   . Ischemic cardiomyopathy    Ejection fraction of 35-40% initially, 45 - 50% on echo 2014, 53% by perfusion study 2016    Medications:  Scheduled:  . atorvastatin  80 mg Oral q1800  . carvedilol  3.125 mg Oral BID WC  . insulin aspart  0-15 Units Subcutaneous TID WC  . mouth rinse  15 mL Mouth Rinse BID  . sodium  chloride flush  3 mL Intravenous Q12H   Infusions:  . sodium chloride    . sodium chloride    . sodium chloride 1 mL/kg/hr (11/14/16 0653)  . heparin 1,700 Units/hr (11/14/16 0800)    Assessment: 17 yoM admitted 8/25 with SOB and elevated troponin, NSTEMI, likely related to profound anemia.  Hgb 5.6 on admission.  Cardiology notes and RN confirm on 8/26 there is NO active bleeding.  PMH significant for iron deficiency anemia.  Guiac negative.  Pharmacy is consulted to dose Heparin.  Today, 11/14/2016: - heparin level now back therapeutic at 0.38 - hgb up 9.1 (s/p 3 units PRBC since adm and feraheme dose on 8/26); plt  Ok - no bleeding documented - plan for cardiac cath today at 3PM   Goal of Therapy:  Heparin level 0.3-0.7 units/ml  Monitor platelets by anticoagulation protocol: Yes   Plan:  - continue heparin drip at 1700 units/hr - f/u with patient after cath procedure  Dia Sitter, PharmD, BCPS 11/14/2016 10:27 AM

## 2016-11-15 ENCOUNTER — Encounter (HOSPITAL_COMMUNITY): Payer: Self-pay | Admitting: Interventional Cardiology

## 2016-11-15 LAB — CBC
HCT: 27.7 % — ABNORMAL LOW (ref 39.0–52.0)
HEMOGLOBIN: 8.4 g/dL — AB (ref 13.0–17.0)
MCH: 22.6 pg — ABNORMAL LOW (ref 26.0–34.0)
MCHC: 30.3 g/dL (ref 30.0–36.0)
MCV: 74.5 fL — ABNORMAL LOW (ref 78.0–100.0)
Platelets: 280 10*3/uL (ref 150–400)
RBC: 3.72 MIL/uL — AB (ref 4.22–5.81)
RDW: 20.3 % — ABNORMAL HIGH (ref 11.5–15.5)
WBC: 7.5 10*3/uL (ref 4.0–10.5)

## 2016-11-15 LAB — GLUCOSE, CAPILLARY
GLUCOSE-CAPILLARY: 118 mg/dL — AB (ref 65–99)
GLUCOSE-CAPILLARY: 144 mg/dL — AB (ref 65–99)
Glucose-Capillary: 151 mg/dL — ABNORMAL HIGH (ref 65–99)
Glucose-Capillary: 139 mg/dL — ABNORMAL HIGH (ref 65–99)

## 2016-11-15 LAB — BASIC METABOLIC PANEL
ANION GAP: 8 (ref 5–15)
BUN: 15 mg/dL (ref 6–20)
CALCIUM: 8.9 mg/dL (ref 8.9–10.3)
CO2: 24 mmol/L (ref 22–32)
Chloride: 103 mmol/L (ref 101–111)
Creatinine, Ser: 1.09 mg/dL (ref 0.61–1.24)
Glucose, Bld: 118 mg/dL — ABNORMAL HIGH (ref 65–99)
Potassium: 4.8 mmol/L (ref 3.5–5.1)
Sodium: 135 mmol/L (ref 135–145)

## 2016-11-15 MED ORDER — LISINOPRIL 5 MG PO TABS
2.5000 mg | ORAL_TABLET | Freq: Every day | ORAL | Status: DC
  Administered 2016-11-15 – 2016-11-16 (×2): 2.5 mg via ORAL
  Filled 2016-11-15 (×2): qty 1

## 2016-11-15 NOTE — Progress Notes (Signed)
CARDIAC REHAB PHASE I   PRE:  Rate/Rhythm: 97 SR  BP:  Sitting: 98/62        SaO2: 99 RA  MODE:  Ambulation: 500 ft   POST:  Rate/Rhythm: 124 ST  BP:  Sitting: 124/69         SaO2: 98 RA  Pt ambulated 500 ft on RA, independent, steady gait, tolerated fairly well.  Pt c/o mild DOE, denies cp, dizziness, declined rest stop. Completed MI education.  Reviewed risk factors, MI book, anti-platelet therapy, activity restrictions, ntg, exercise, heart healthy and diabetes diet handouts, s/s chf, daily weights and phase 2 cardiac rehab. Pt verbalized understanding. Pt agrees to phase 2 cardiac rehab referral, will send to [facility] per pt request. Pt to recliner after walk, call bell within reach.    [id number] Lenna Sciara, RN, BSN 11/15/2016 12:15 PM

## 2016-11-15 NOTE — Progress Notes (Signed)
Progress Note  Patient Name: Trevor [name] Date of Encounter: 11/15/2016  Primary Cardiologist: Dr. Fletcher Anon  Subjective   No chest pain no SOB, slept well last night   Inpatient Medications    Scheduled Meds: . aspirin  81 mg Oral Daily  . atorvastatin  80 mg Oral q1800  . carvedilol  3.125 mg Oral BID WC  . heparin  5,000 Units Subcutaneous Q8H  . insulin aspart  0-15 Units Subcutaneous TID WC  . mouth rinse  15 mL Mouth Rinse BID  . sodium chloride flush  3 mL Intravenous Q12H   Continuous Infusions: . sodium chloride    . sodium chloride     PRN Meds: sodium chloride, acetaminophen, ondansetron (ZOFRAN) IV, ondansetron (ZOFRAN) IV, oxyCODONE, sodium chloride flush   Vital Signs    Vitals:   11/14/16 2000 11/15/16 0350 11/15/16 0700 11/15/16 0745  BP: (!) 101/56 (!) 91/59 (!) 108/58 (!) 108/58  Pulse: (!) 105 96 (!) 108   Resp: 16 16 20  (!) 22  Temp: 98.1 F (36.7 C) 98 F (36.7 C) 98.4 F (36.9 C)   TempSrc: Oral Oral Oral   SpO2: 99% 98% 100% 99%  Weight:  184 lb 4.9 oz (83.6 kg)    Height:        Intake/Output Summary (Last 24 hours) at 11/15/16 1110 Last data filed at 11/15/16 0700  Gross per 24 hour  Intake           1053.9 ml  Output             2575 ml  Net          -1521.1 ml   Filed Weights   11/11/16 1353 11/14/16 0600 11/15/16 0350  Weight: 170 lb (77.1 kg) 174 lb 6.1 oz (79.1 kg) 184 lb 4.9 oz (83.6 kg)    Telemetry    SR no VT 11/15/2016 - Personally Reviewed  ECG    SR with lateral and lead I and II ST depression today - Personally Reviewed  Physical Exam   Affect appropriate Pale elderly male  HEENT: normal Neck supple with no adenopathy JVP normal no bruits no thyromegaly Lungs clear with no wheezing and good diaphragmatic motion Heart:  S1/S2 no murmur, no rub, gallop or click PMI normal Abdomen: benighn, BS positve, no tenderness, no AAA no bruit.  No HSM or HJR Distal pulses intact with no bruits No edema Neuro  non-focal Skin warm and dry No muscular weakness Left radial band off pulse good   Labs    Chemistry  Recent Labs Lab 11/13/16 0054 11/14/16 0307 11/14/16 1832 11/15/16 0340  NA 135 134*  --  135  K 4.9 4.5  --  4.8  CL 107 103  --  103  CO2 19* 22  --  24  GLUCOSE 163* 143*  --  118*  BUN 21* 18  --  15  CREATININE 1.15 1.17 1.06 1.09  CALCIUM 9.0 9.0  --  8.9  PROT 6.8  --   --   --   ALBUMIN 3.3*  --   --   --   AST 29  --   --   --   ALT 18  --   --   --   ALKPHOS 92  --   --   --   BILITOT 0.8  --   --   --   GFRNONAA >60 >60 >60 >60  GFRAA >60 >60 >60 >60  ANIONGAP 9  9  --  8     Hematology  Recent Labs Lab 11/14/16 0307 11/14/16 1832 11/15/16 0340  WBC 9.6 9.8 7.5  RBC 3.93* 3.89* 3.72*  HGB 9.1* 9.0* 8.4*  HCT 28.9* 28.9* 27.7*  MCV 73.5* 74.3* 74.5*  MCH 23.2* 23.1* 22.6*  MCHC 31.5 31.1 30.3  RDW 19.8* 20.2* 20.3*  PLT 294 304 280    Cardiac Enzymes  Recent Labs Lab 11/11/16 1728 11/11/16 2320 11/12/16 0527 11/13/16 0054  TROPONINI 2.75* 2.14* 3.49* 3.14*     Recent Labs Lab 11/11/16 1438  TROPIPOC 2.18*     BNPNo results for input(s): BNP, PROBNP in the last 168 hours.   DDimer No results for input(s): DDIMER in the last 168 hours.   Radiology    No results found.  Cardiac Studies   11/12/16  Echo Study Conclusions  - Left ventricle: The cavity size was normal. There was mild focal   basal hypertrophy of the septum. Systolic function was severely   reduced. The estimated ejection fraction was in the range of 20%   to 25%. Images were inadequate for LV wall motion assessment. - Mitral valve: There was mild regurgitation. - Left atrium: The atrium was mildly to moderately dilated. - Right ventricle: The cavity size was mildly dilated. Wall   thickness was normal. - Tricuspid valve: There was moderate regurgitation. - Pulmonary arteries: PA peak pressure: 52 mm Hg (S).  Impressions:  - The right ventricular  systolic pressure was increased consistent   with moderate pulmonary hypertension.  Recommendations:  Limited study with definity contrast to evaluate wall motion and rule out apical thrombus.   Patient Profile     71 y.o. male with hx CAD, hx ant MI 2014 and CABG 2014, DM, ICM now with EF up to 53% in 2016, HLD now presenting with with SOB with Hgb 5.6, and iron def anemia.  + troponin and ST depression in lat. Leads consistent with ischemia.     Assessment & Plan    NSTEMI troponin 3.49, no chest pain Reviewed cath films from yesterday and discussed with his primary cardiologist Dr Fletcher Anon and Dr Tamala Julian. No revascularization options especially with falling Hb/anemia. Not a redo candidate could try to stent RCA/LM circumflex in emergency setting for acute ST elevation MI Continue 81 mg ASA and beta blocker He is not a candidate for surgical nephrectomy   Profound anemia iron transfuse to keep Hb over 8   Left renal mass - oncology has seen not clear what plans are for biopsy or chemoRx not a candidate For open nephrectomy due to severe ischemic DCM and failed SVG;s   ICM now with EF 20-25%   Mild MR mod TR, PA pk pressure 52 mmHg.  try to restart low dose ACE Euvolemic and laying flat compensated  DM per IM + SSI  CKD Cr today 1.1 stable post contrast load   HLD on statin   Signed, Jenkins Rouge, MD  11/15/2016, 11:10 AM

## 2016-11-15 NOTE — Care Management Important Message (Signed)
Important Message  Patient Details  Name: Trevor [name] MRN: [id number] Date of Birth: 05/19/1945   Medicare Important Message Given:  Yes    Orbie Pyo 11/15/2016, 12:29 PM

## 2016-11-15 NOTE — Consult Note (Signed)
Reason for Consult: Large Left Renal Mass / Likely Metastatic Kidney Cancer  Referring Physician: Verneita Griffes MD  Trevor [name] is an 71 y.o. male.   HPI:   1 - Large Left Renal Mass / Likely OligoMetastatic Kidney Cancer - Left 11cm renal mass by CT 10/2016 on eval acute on chronic anemia. Appears 1 artery / 1 vein renovascular anatomy with large parasitics as anticipated. CT chest with multiple non-bulky nodules. Left renal vein is quite thick raising some question of tumor thrombus. Cr 1.09.   PMH sig for CAD/CABG, ischemic cardiomyopathy (minimal functional limitations at baseline), chronic anemia,   Today "Trevor [name]" is seen in consultation for above.   Past Medical History:  Diagnosis Date  . Anemia   . Coronary artery disease    06/2012: Anterior ST elevation myocardial infarction. Cardiac catheterization showed significant three-vessel coronary artery disease, ejection fraction 35-40%. The patient underwent CABG with LIMA to LAD, SVG to ramus and sequential SVG to right PDA/PL  . Diabetes mellitus without complication ([place])   . Hyperlipidemia   . Ischemic cardiomyopathy    Ejection fraction of 35-40% initially, 45 - 50% on echo 2014, 53% by perfusion study 2016    Past Surgical History:  Procedure Laterality Date  . CORONARY ARTERY BYPASS GRAFT N/A 07/05/2012   Procedure: CORONARY ARTERY BYPASS GRAFTING (CABG);  Surgeon: Ivin Poot, MD;  Location: [facility];  Service: Open Heart Surgery;  Laterality: N/A;  . INTRAOPERATIVE TRANSESOPHAGEAL ECHOCARDIOGRAM N/A 07/05/2012   Procedure: INTRAOPERATIVE TRANSESOPHAGEAL ECHOCARDIOGRAM;  Surgeon: Ivin Poot, MD;  Location: [facility];  Service: Open Heart Surgery;  Laterality: N/A;  . LEFT HEART CATH AND CORS/GRAFTS ANGIOGRAPHY N/A 11/14/2016   Procedure: LEFT HEART CATH AND CORS/GRAFTS ANGIOGRAPHY;  Surgeon: Belva Crome, MD;  Location: [facility];  Service: Cardiovascular;  Laterality: N/A;  . LEFT HEART CATHETERIZATION WITH  CORONARY ANGIOGRAM N/A 07/04/2012   Procedure: LEFT HEART CATHETERIZATION WITH CORONARY ANGIOGRAM;  Surgeon: Wellington Hampshire, MD;  Location: [facility];  Service: Cardiovascular;  Laterality: N/A;    Family History  Problem Relation Age of Onset  . Cancer Mother        leukemia  . Cancer Maternal Grandfather        possible cancer  . Heart attack Brother 56    Social History:  reports that he quit smoking about 4 years ago. He has a 50.00 pack-year smoking history. He has never used smokeless tobacco. He reports that he does not drink alcohol or use drugs.  Allergies: No Known Allergies  Medications: I have reviewed the patient's current medications.  Results for orders placed or performed during the hospital encounter of 11/11/16 (from the past 48 hour(s))  Glucose, capillary     Status: Abnormal   Collection Time: 11/13/16  9:09 PM  Result Value Ref Range   Glucose-Capillary 194 (H) 65 - 99 mg/dL   Comment 1 Notify RN    Comment 2 Document in Chart   Heparin level (unfractionated)     Status: Abnormal   Collection Time: 11/13/16 11:39 PM  Result Value Ref Range   Heparin Unfractionated 0.26 (L) 0.30 - 0.70 IU/mL    Comment:        IF HEPARIN RESULTS ARE BELOW EXPECTED VALUES, AND PATIENT DOSAGE HAS BEEN CONFIRMED, SUGGEST FOLLOW UP TESTING OF ANTITHROMBIN III LEVELS.   Basic metabolic panel     Status: Abnormal   Collection Time: 11/14/16  3:07 AM  Result Value Ref Range  Sodium 134 (L) 135 - 145 mmol/L   Potassium 4.5 3.5 - 5.1 mmol/L   Chloride 103 101 - 111 mmol/L   CO2 22 22 - 32 mmol/L   Glucose, Bld 143 (H) 65 - 99 mg/dL   BUN 18 6 - 20 mg/dL   Creatinine, Ser 1.17 0.61 - 1.24 mg/dL   Calcium 9.0 8.9 - 10.3 mg/dL   GFR calc non Af Amer >60 >60 mL/min   GFR calc Af Amer >60 >60 mL/min    Comment: (NOTE) The eGFR has been calculated using the CKD EPI equation. This calculation has not been validated in all clinical situations. eGFR's persistently <60  mL/min signify possible Chronic Kidney Disease.    Anion gap 9 5 - 15  CBC     Status: Abnormal   Collection Time: 11/14/16  3:07 AM  Result Value Ref Range   WBC 9.6 4.0 - 10.5 K/uL   RBC 3.93 (L) 4.22 - 5.81 MIL/uL   Hemoglobin 9.1 (L) 13.0 - 17.0 g/dL   HCT 28.9 (L) 39.0 - 52.0 %   MCV 73.5 (L) 78.0 - 100.0 fL   MCH 23.2 (L) 26.0 - 34.0 pg   MCHC 31.5 30.0 - 36.0 g/dL   RDW 19.8 (H) 11.5 - 15.5 %   Platelets 294 150 - 400 K/uL  Glucose, capillary     Status: Abnormal   Collection Time: 11/14/16  7:53 AM  Result Value Ref Range   Glucose-Capillary 129 (H) 65 - 99 mg/dL   Comment 1 Notify RN    Comment 2 Document in Chart   Heparin level (unfractionated)     Status: None   Collection Time: 11/14/16  9:16 AM  Result Value Ref Range   Heparin Unfractionated 0.38 0.30 - 0.70 IU/mL    Comment:        IF HEPARIN RESULTS ARE BELOW EXPECTED VALUES, AND PATIENT DOSAGE HAS BEEN CONFIRMED, SUGGEST FOLLOW UP TESTING OF ANTITHROMBIN III LEVELS.   Protime-INR     Status: None   Collection Time: 11/14/16  9:16 AM  Result Value Ref Range   Prothrombin Time 14.1 11.4 - 15.2 seconds   INR 1.08   Glucose, capillary     Status: Abnormal   Collection Time: 11/14/16 12:29 PM  Result Value Ref Range   Glucose-Capillary 112 (H) 65 - 99 mg/dL   Comment 1 Notify RN    Comment 2 Document in Chart   Glucose, capillary     Status: Abnormal   Collection Time: 11/14/16  5:34 PM  Result Value Ref Range   Glucose-Capillary 115 (H) 65 - 99 mg/dL   Comment 1 Notify RN    Comment 2 Document in Chart   CBC     Status: Abnormal   Collection Time: 11/14/16  6:32 PM  Result Value Ref Range   WBC 9.8 4.0 - 10.5 K/uL   RBC 3.89 (L) 4.22 - 5.81 MIL/uL   Hemoglobin 9.0 (L) 13.0 - 17.0 g/dL   HCT 28.9 (L) 39.0 - 52.0 %   MCV 74.3 (L) 78.0 - 100.0 fL   MCH 23.1 (L) 26.0 - 34.0 pg   MCHC 31.1 30.0 - 36.0 g/dL   RDW 20.2 (H) 11.5 - 15.5 %   Platelets 304 150 - 400 K/uL  Creatinine, serum     Status:  None   Collection Time: 11/14/16  6:32 PM  Result Value Ref Range   Creatinine, Ser 1.06 0.61 - 1.24 mg/dL   GFR calc  non Af Amer >60 >60 mL/min   GFR calc Af Amer >60 >60 mL/min    Comment: (NOTE) The eGFR has been calculated using the CKD EPI equation. This calculation has not been validated in all clinical situations. eGFR's persistently <60 mL/min signify possible Chronic Kidney Disease.   Glucose, capillary     Status: Abnormal   Collection Time: 11/14/16  9:18 PM  Result Value Ref Range   Glucose-Capillary 178 (H) 65 - 99 mg/dL   Comment 1 Notify RN    Comment 2 Document in Chart   Basic metabolic panel     Status: Abnormal   Collection Time: 11/15/16  3:40 AM  Result Value Ref Range   Sodium 135 135 - 145 mmol/L   Potassium 4.8 3.5 - 5.1 mmol/L   Chloride 103 101 - 111 mmol/L   CO2 24 22 - 32 mmol/L   Glucose, Bld 118 (H) 65 - 99 mg/dL   BUN 15 6 - 20 mg/dL   Creatinine, Ser 1.09 0.61 - 1.24 mg/dL   Calcium 8.9 8.9 - 10.3 mg/dL   GFR calc non Af Amer >60 >60 mL/min   GFR calc Af Amer >60 >60 mL/min    Comment: (NOTE) The eGFR has been calculated using the CKD EPI equation. This calculation has not been validated in all clinical situations. eGFR's persistently <60 mL/min signify possible Chronic Kidney Disease.    Anion gap 8 5 - 15  CBC     Status: Abnormal   Collection Time: 11/15/16  3:40 AM  Result Value Ref Range   WBC 7.5 4.0 - 10.5 K/uL   RBC 3.72 (L) 4.22 - 5.81 MIL/uL   Hemoglobin 8.4 (L) 13.0 - 17.0 g/dL   HCT 27.7 (L) 39.0 - 52.0 %   MCV 74.5 (L) 78.0 - 100.0 fL   MCH 22.6 (L) 26.0 - 34.0 pg   MCHC 30.3 30.0 - 36.0 g/dL   RDW 20.3 (H) 11.5 - 15.5 %   Platelets 280 150 - 400 K/uL  Glucose, capillary     Status: Abnormal   Collection Time: 11/15/16  6:26 AM  Result Value Ref Range   Glucose-Capillary 118 (H) 65 - 99 mg/dL  Glucose, capillary     Status: Abnormal   Collection Time: 11/15/16 12:57 PM  Result Value Ref Range   Glucose-Capillary 151  (H) 65 - 99 mg/dL   Comment 1 Notify RN    Comment 2 Document in Chart     No results found.  Review of Systems  Constitutional: Positive for malaise/fatigue. Negative for chills and fever.  HENT: Negative.   Eyes: Negative.   Respiratory: Negative.   Cardiovascular: Positive for chest pain.  Gastrointestinal: Negative.   Genitourinary: Positive for flank pain. Negative for hematuria.  Skin: Negative.   Neurological: Negative.   Endo/Heme/Allergies: Negative.   Psychiatric/Behavioral: Negative.    Blood pressure (!) 92/58, pulse 93, temperature 97.8 F (36.6 C), temperature source Oral, resp. rate (!) 23, height 6' (1.829 m), weight 83.6 kg (184 lb 4.9 oz), SpO2 98 %. Physical Exam  Constitutional: He is oriented to person, place, and time. He appears well-developed.  HENT:  Head: Normocephalic.  Eyes: Pupils are equal, round, and reactive to light.  Neck: Normal range of motion.  Cardiovascular: Normal rate.   Sternotomy scar noted.   Respiratory: Effort normal.  GI: Soft.  Genitourinary:  Genitourinary Comments: NO CVAT  Neurological: He is alert and oriented to person, place, and time.  Skin: Skin  is warm.  Psychiatric: He has a normal mood and affect.    Assessment/Plan:  1 - Large Left Renal Mass / Likely OligoMetastatic Kidney Cancer - ideal oncologic pathway would be elective nephrectomy after Hgb stabilized and back to functional baseline followed by oral oncolytics. Cardiology team with some concern about candidacy for surgery, though his funcitonal baseline is fairly good. Will discuss. Do rec more detailed mass / vascular imagign with renal MRI with contrast while in house to help r/o bulky vein thrombus which would also aid in decision making. If no / minimal IVC thrombus, then surgery would be done robotically with expected <200cc blood loss. If large IVC clot, surgery MUCH riskier proposition.  Please call me directly with questions anytime. Will follow and  arrange outpatient GU follow up.   Earma Nicolaou 11/15/2016, 5:46 PM

## 2016-11-15 NOTE — Care Management Note (Addendum)
Case Management Note  Patient Details  Name: Trevor [name] MRN: [id number] Date of Birth: 1945-06-11  Subjective/Objective:    From home,  Presents with NSTEMI, anemia, left renal mass, ICM, CKD s/p heart cath. Per MD no revascularization opitons with falling hgb/anemia, also not a redo candidate and not a surgical candidate for nephrectomy.                  Action/Plan: NCM will follow for dc needs.   Expected Discharge Date:                  Expected Discharge Plan:  Home/Self Care  In-House Referral:     Discharge planning Services  CM Consult  Post Acute Care Choice:    Choice offered to:     DME Arranged:    DME Agency:     HH Arranged:    HH Agency:     Status of Service:  In process, will continue to follow  If discussed at Long Length of Stay Meetings, dates discussed:    Additional Comments:  Zenon Mayo, RN 11/15/2016, 9:32 AM

## 2016-11-15 NOTE — Progress Notes (Signed)
PROGRESS NOTE [facility]   Dempsy Damiano   SEG:[id number] DOB: 1945-07-11  DOA: 11/11/2016 PCP: [name], MD   Brief Narrative:  64 male CAD-CABG 2014 Dr. Lucianne Lei Tright  systolic heart failure with ejection fraction 35-40%,  diabetes type 2, hypertension and history of MI in 2014.   Presented shortness of breath and weakness.   found to have hemoglobin of 5.6. EKG showed lateral ST segment depression with elevated troponin.   Patient was admitted for blood transfusion and cardiology evaluation.  Incidental lung nodules where found on Xray and CT chest/abd/pelv show poss RCC with lung mets.  Oncology has been consulted, recommended urology eval for possible nephrectomy. Patient for cardiac cath today.   Subjective:   Assessment & Plan:  Symptomatic anemia - Hgb stable  ? iron deficiency anemia and iron is very low. Could be secondary to chronic kidney disease versus malignancy as there is poss RCC with lung mets  S/p 3 PRBC's during hospital stay and 1 IV iron infusion. To keep Hgb > 8 in view of cardiac cath  No overt bleeding, FOBT negative  Hb 8.4 now    NSTEMI 2/2 demand ischemia  In setting of severe anemia and history of CAD. EKG with ST segment depression in lateral leads Echo worsened EF, wall motion unable to evaluate  Cont Coreg as patient continues to be tachycardic   Pulmonary nodule  Incidental findings, patient with history of smoking for 40 years 2 packs per days CT scan showing possible metastasis, and renal mass ? Primary lesion Oncology input appreciated   Renal Mass Incidental finding on CT chest Concern for renal cell carcinoma CT abd/pelvis Renal mass strongly suggesting RCC  Oncology recommending - urology eval for possible nephrectomy   Oncology will follow  Op with options if not surgical candidate  DM type 2 - stable  Patient on metformin at home, will hold for now given AKI Placed on sliding scale and monitor CBG cbg 118---150  AKI  - ? CKD component as well  unknown stage - Cr stable  Patient was treated with aggressive hydration - IVF d/ced due to mild overload  Cr at baseline  Monitor kidney function in AM   Acute on Chronic systolic dysfunction EF 73-71% in 2014 - New ECHO with severe decrease EF 20-25%  Mild exacerbation due to aggressive hydration - SOB and bibasilar crackles  Coreg resumed - further management per card recommendations  I/0 -560 only  Hyperkalemia - Resolved  Was treated with Lasix and IV fluids  Back pain - MSK  OOB and pain control PRN   DVT prophylaxis: Heparin drip Code Status: Full code Family Communication: None at bedside Disposition Plan: Home when medically stable  Consultants:   Cardiology  Oncology   Urology  Procedures:   Echocardiogram 11/13/2011 ------------------------------------------------------------------- Study Conclusions  - Left ventricle: The cavity size was normal. There was mild focal   basal hypertrophy of the septum. Systolic function was severely   reduced. The estimated ejection fraction was in the range of 20%   to 25%. Images were inadequate for LV wall motion assessment. - Mitral valve: There was mild regurgitation. - Left atrium: The atrium was mildly to moderately dilated. - Right ventricle: The cavity size was mildly dilated. Wall   thickness was normal. - Tricuspid valve: There was moderate regurgitation. - Pulmonary arteries: PA peak pressure: 52 mm Hg (S).  Impressions:  - The right ventricular systolic pressure was increased consistent   with moderate pulmonary hypertension.  Antimicrobials:  na  Objective: Vitals:   11/15/16 0350 11/15/16 0700 11/15/16 0745 11/15/16 1250  BP: (!) 91/59 (!) 108/58 (!) 108/58 106/64  Pulse: 96 (!) 108  (!) 106  Resp: 16 20 (!) 22 (!) 23  Temp: 98 F (36.7 C) 98.4 F (36.9 C)  (!) 97.5 F (36.4 C)  TempSrc: Oral Oral  Oral  SpO2: 98% 100% 99% 100%  Weight: 83.6 kg (184 lb 4.9 oz)      Height:        Intake/Output Summary (Last 24 hours) at 11/15/16 1359 Last data filed at 11/15/16 0700  Gross per 24 hour  Intake            677.5 ml  Output             1100 ml  Net           -422.5 ml   Filed Weights   11/11/16 1353 11/14/16 0600 11/15/16 0350  Weight: 77.1 kg (170 lb) 79.1 kg (174 lb 6.1 oz) 83.6 kg (184 lb 4.9 oz)    Examination:  eomi pleasant in nad no cp cta b abd soft nt nd no rebound No le edema Neuro intact Psych intact Skin no rash  Data Reviewed: I have personally reviewed following labs and imaging studies  CBC:  Recent Labs Lab 11/12/16 0527 11/13/16 0054 11/13/16 1515 11/14/16 0307 11/14/16 1832 11/15/16 0340  WBC 12.4* 12.3* 11.8* 9.6 9.8 7.5  NEUTROABS 10.4* 9.8*  --   --   --   --   HGB 7.8* 7.8* 9.0* 9.1* 9.0* 8.4*  HCT 25.1* 24.6* 28.7* 28.9* 28.9* 27.7*  MCV 71.5* 72.4* 73.4* 73.5* 74.3* 74.5*  PLT 328 322 320 294 304 427   Basic Metabolic Panel:  Recent Labs Lab 11/11/16 1418 11/12/16 0527 11/13/16 0054 11/14/16 0307 11/14/16 1832 11/15/16 0340  NA 135 136 135 134*  --  135  K 5.1 5.7* 4.9 4.5  --  4.8  CL 105 108 107 103  --  103  CO2 19* 20* 19* 22  --  24  GLUCOSE 154* 147* 163* 143*  --  118*  BUN 27* 28* 21* 18  --  15  CREATININE 1.76* 1.55* 1.15 1.17 1.06 1.09  CALCIUM 9.0 8.8* 9.0 9.0  --  8.9   GFR: Estimated Creatinine Clearance: 69.2 mL/min (by C-G formula based on SCr of 1.09 mg/dL). Liver Function Tests:  Recent Labs Lab 11/13/16 0054  AST 29  ALT 18  ALKPHOS 92  BILITOT 0.8  PROT 6.8  ALBUMIN 3.3*   No results for input(s): LIPASE, AMYLASE in the last 168 hours. No results for input(s): AMMONIA in the last 168 hours. Coagulation Profile:  Recent Labs Lab 11/12/16 0833 11/14/16 0916  INR 1.13 1.08   Cardiac Enzymes:  Recent Labs Lab 11/11/16 1728 11/11/16 2320 11/12/16 0527 11/13/16 0054  TROPONINI 2.75* 2.14* 3.49* 3.14*   BNP (last 3 results) No results for  input(s): PROBNP in the last 8760 hours. HbA1C: No results for input(s): HGBA1C in the last 72 hours. CBG:  Recent Labs Lab 11/14/16 1229 11/14/16 1734 11/14/16 2118 11/15/16 0626 11/15/16 1257  GLUCAP 112* 115* 178* 118* 151*   Lipid Profile: No results for input(s): CHOL, HDL, LDLCALC, TRIG, CHOLHDL, LDLDIRECT in the last 72 hours. Thyroid Function Tests: No results for input(s): TSH, T4TOTAL, FREET4, T3FREE, THYROIDAB in the last 72 hours. Anemia Panel: No results for input(s): VITAMINB12, FOLATE, FERRITIN, TIBC, IRON, RETICCTPCT in  the last 72 hours. Sepsis Labs: No results for input(s): PROCALCITON, LATICACIDVEN in the last 168 hours.  Recent Results (from the past 240 hour(s))  MRSA PCR Screening     Status: None   Collection Time: 11/12/16  2:01 PM  Result Value Ref Range Status   MRSA by PCR NEGATIVE NEGATIVE Final    Comment:        The GeneXpert MRSA Assay (FDA approved for NASAL specimens only), is one component of a comprehensive MRSA colonization surveillance program. It is not intended to diagnose MRSA infection nor to guide or monitor treatment for MRSA infections.      Radiology Studies: No results found.  Scheduled Meds: . aspirin  81 mg Oral Daily  . atorvastatin  80 mg Oral q1800  . carvedilol  3.125 mg Oral BID WC  . heparin  5,000 Units Subcutaneous Q8H  . insulin aspart  0-15 Units Subcutaneous TID WC  . lisinopril  2.5 mg Oral Daily  . mouth rinse  15 mL Mouth Rinse BID  . sodium chloride flush  3 mL Intravenous Q12H   Continuous Infusions: . sodium chloride    . sodium chloride       LOS: 4 days    Verneita Griffes, MD [facility] [phone]

## 2016-11-15 NOTE — Progress Notes (Signed)
TR BAND REMOVAL  LOCATION:    left radial  DEFLATED PER PROTOCOL:    Yes.    TIME BAND OFF / DRESSING APPLIED:    21:15   SITE UPON ARRIVAL:    Level 0  SITE AFTER BAND REMOVAL:    Level 0  CIRCULATION SENSATION AND MOVEMENT:    Within Normal Limits   Yes.    COMMENTS:   Post TR band instructions given. Pt tolerated well. 

## 2016-11-16 ENCOUNTER — Inpatient Hospital Stay (HOSPITAL_COMMUNITY): Payer: Medicare Other

## 2016-11-16 LAB — GLUCOSE, CAPILLARY: Glucose-Capillary: 155 mg/dL — ABNORMAL HIGH (ref 65–99)

## 2016-11-16 MED ORDER — GADOBENATE DIMEGLUMINE 529 MG/ML IV SOLN
20.0000 mL | Freq: Once | INTRAVENOUS | Status: AC
  Administered 2016-11-16: 17 mL via INTRAVENOUS

## 2016-11-16 MED ORDER — LISINOPRIL 2.5 MG PO TABS: 2.5000 mg | ORAL_TABLET | Freq: Every day | ORAL | 0 refills | Status: DC

## 2016-11-16 NOTE — Progress Notes (Signed)
Progress Note  Patient Name: Trevor [name] Date of Encounter: 11/16/2016  Primary Cardiologist: Dr. 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 aspirin  81 mg Oral Daily  . atorvastatin  80 mg Oral q1800  . carvedilol  3.125 mg Oral BID WC  . heparin  5,000 Units Subcutaneous Q8H  . insulin aspart  0-15 Units Subcutaneous TID WC  . lisinopril  2.5 mg Oral Daily  . mouth rinse  15 mL Mouth Rinse BID  . sodium chloride flush  3 mL Intravenous Q12H   Continuous Infusions: . sodium chloride    . sodium chloride     PRN Meds: sodium chloride, acetaminophen, ondansetron (ZOFRAN) IV, ondansetron (ZOFRAN) IV, oxyCODONE, sodium chloride flush   Vital Signs    Vitals:   11/15/16 1454 11/15/16 1958 11/15/16 2000 11/16/16 0446  BP: (!) 92/58 (!) 92/59 (!) 92/59 (!) 90/58  Pulse: 93 96  92  Resp: (!) 23 18 (!) 24 18  Temp: 97.8 F (36.6 C) 98.3 F (36.8 C)  98 F (36.7 C)  TempSrc: Oral Oral  Oral  SpO2: 98% 95%  98%  Weight:    178 lb 5.6 oz (80.9 kg)  Height:        Intake/Output Summary (Last 24 hours) at 11/16/16 3614 Last data filed at 11/15/16 2220  Gross per 24 hour  Intake              240 ml  Output              150 ml  Net               90 ml   Filed Weights   11/14/16 0600 11/15/16 0350 11/16/16 0446  Weight: 174 lb 6.1 oz (79.1 kg) 184 lb 4.9 oz (83.6 kg) 178 lb 5.6 oz (80.9 kg)    Telemetry    SR no VT 11/16/2016 - Personally Reviewed  ECG    SR with lateral and lead I and II ST depression today - Personally Reviewed  Physical Exam   Affect appropriate Pale elderly male  HEENT: normal Neck supple with no adenopathy JVP normal no bruits no thyromegaly Lungs clear with no wheezing and good diaphragmatic motion Heart:  S1/S2 no murmur, no rub, gallop or click PMI normal Abdomen: benighn, BS positve, no tenderness, no AAA no bruit.  No HSM or HJR Distal pulses intact with no  bruits No edema Neuro non-focal Skin warm and dry No muscular weakness Left radial band off pulse good   Labs    Chemistry  Recent Labs Lab 11/13/16 0054 11/14/16 0307 11/14/16 1832 11/15/16 0340  NA 135 134*  --  135  K 4.9 4.5  --  4.8  CL 107 103  --  103  CO2 19* 22  --  24  GLUCOSE 163* 143*  --  118*  BUN 21* 18  --  15  CREATININE 1.15 1.17 1.06 1.09  CALCIUM 9.0 9.0  --  8.9  PROT 6.8  --   --   --   ALBUMIN 3.3*  --   --   --   AST 29  --   --   --   ALT 18  --   --   --   ALKPHOS 92  --   --   --   BILITOT 0.8  --   --   --  GFRNONAA >60 >60 >60 >60  GFRAA >60 >60 >60 >60  ANIONGAP 9 9  --  8     Hematology  Recent Labs Lab 11/14/16 0307 11/14/16 1832 11/15/16 0340  WBC 9.6 9.8 7.5  RBC 3.93* 3.89* 3.72*  HGB 9.1* 9.0* 8.4*  HCT 28.9* 28.9* 27.7*  MCV 73.5* 74.3* 74.5*  MCH 23.2* 23.1* 22.6*  MCHC 31.5 31.1 30.3  RDW 19.8* 20.2* 20.3*  PLT 294 304 280    Cardiac Enzymes  Recent Labs Lab 11/11/16 1728 11/11/16 2320 11/12/16 0527 11/13/16 0054  TROPONINI 2.75* 2.14* 3.49* 3.14*     Recent Labs Lab 11/11/16 1438  TROPIPOC 2.18*     BNPNo results for input(s): BNP, PROBNP in the last 168 hours.   DDimer No results for input(s): DDIMER in the last 168 hours.   Radiology    No results found.  Cardiac Studies   11/12/16  Echo Study Conclusions  - Left ventricle: The cavity size was normal. There was mild focal   basal hypertrophy of the septum. Systolic function was severely   reduced. The estimated ejection fraction was in the range of 20%   to 25%. Images were inadequate for LV wall motion assessment. - Mitral valve: There was mild regurgitation. - Left atrium: The atrium was mildly to moderately dilated. - Right ventricle: The cavity size was mildly dilated. Wall   thickness was normal. - Tricuspid valve: There was moderate regurgitation. - Pulmonary arteries: PA peak pressure: 52 mm Hg (S).  Impressions:  - The  right ventricular systolic pressure was increased consistent   with moderate pulmonary hypertension.  Recommendations:  Limited study with definity contrast to evaluate wall motion and rule out apical thrombus.   Patient Profile     71 y.o. male with hx CAD, hx ant MI 2014 and CABG 2014, DM, ICM now with EF up to 53% in 2016, HLD now presenting with with SOB with Hgb 5.6, and iron def anemia.  + troponin and ST depression in lat. Leads consistent with ischemia.     Assessment & Plan    NSTEMI troponin 3.49, no chest pain Reviewed cath films from yesterday and discussed with his primary cardiologist Dr Fletcher Anon and Dr Tamala Julian. No revascularization options especially with falling Hb/anemia. Not a redo candidate could try to stent RCA/LM circumflex in emergency setting for acute ST elevation MI Continue 81 mg ASA and beta blocker He is not a candidate for surgical nephrectomy   Profound anemia iron transfuse to keep Hb over 8   Left renal mass - oncology has seen MRI today to assess degree of IVC thrombus Urology has seen suspect only option will be oral oncolytics and palliative care   ICM now with EF 20-25%   Mild MR mod TR, PA pk pressure 52 mmHg.  try to restart low dose ACE Euvolemic and laying flat compensated  DM per IM + SSI  CKD Cr today 1.1 stable post contrast load   HLD on statin   Signed, Jenkins Rouge, MD  11/16/2016, 8:21 AM

## 2016-11-16 NOTE — Discharge Summary (Addendum)
Physician Discharge Summary  Trevor [name] IZT:[id number] DOB: 08-07-1945 DOA: 11/11/2016  PCP: [name], MD  Admit date: 11/11/2016 Discharge date: 11/16/2016  Time spent: 35 minutes  Recommendations for Outpatient Follow-up:  1. Patient will need care coordination between oncologist Dr. Earlie Server and urologist Dr. Bess Harvest with regards to new diagnosis of possible renal cell carcinoma--he will need to discuss options as an outpatient with his specialists and I will cc both physicians 2. Patient will need close follow-up with Dr. Fletcher Anon of cardiology 3. Recommend basic metabolic panel in addition to complete blood count in about one week 4. Recommend continuing iron tablets and checking iron studies in 3 weeks-might require IV iron or epo   Discharge Diagnoses:  Principal Problem:   NSTEMI (non-ST elevated myocardial infarction) ([place]) Active Problems:   Coronary artery disease   Hyperlipidemia   Iron deficiency anemia due to chronic blood loss   Anemia   Discharge Condition: Guarded  Diet recommendation: Heart healthy  Filed Weights   11/14/16 0600 11/15/16 0350 11/16/16 0446  Weight: 79.1 kg (174 lb 6.1 oz) 83.6 kg (184 lb 4.9 oz) 80.9 kg (178 lb 5.6 oz)    History of present illness:  35 male CAD-CABG 2014 Dr. Lucianne Lei Tright  systolic heart failure with ejection fraction 35-40%,  diabetes type 2, hypertension and history of MI in 2014.   Presented shortness of breath and weakness.   found to have hemoglobin of 5.6. EKG showed lateral ST segment depression with elevated troponin.   Patient was admitted for blood transfusion and cardiology evaluation.  Incidental lung nodules where found on Xray and CT chest/abd/pelv show poss RCC with lung mets.  Oncology urology saw the patient he had a cardiac cath and he stabilized See below discussion  Hospital Course:  Symptomatic anemia - Hgb stable  ? iron deficiency anemia and iron is very low. Could be secondary to chronic kidney  disease versus malignancy as there is poss RCC with lung mets  S/p 3 PRBC's during hospital stay and 1 IV iron infusion. To keep Hgb > 8 in view of cardiac cath  No overt bleeding, FOBT negative  Hb 8.4 now    NSTEMI 2/2 demand ischemia  Ischemic cardiomyopathy Cardiac cath done shows severe disease as below not a candidate for revascularization unless emergent per cardiology input In setting of severe anemia and history of CAD. EKG with ST segment depression in lateral leads Echo worsened EF, wall motion unable to evaluate  Cont Coreg as patient continues to be tachycardic  Cardiology was indicated he is high risk for any type of operative procedure with a depressed EF of 20 percent and hand seizure risk  we'll continue Coreg 3.125 however will decrease dose of lisinopril to 2.5 given mild kidney function derangement this admission  Pulmonary nodule  Incidental findings, patient with history of smoking for 40 years 2 packs per days CT scan showing possible metastasis, and renal mass ? Primary lesion Oncology input appreciated --will CC  Renal Mass Incidental finding on CT chest Concern for renal cell carcinoma CT abd/pelvis Renal mass strongly suggesting [place]  Oncology recommending - urology eval for possible nephrectomy              Oncology will follow  Op with options if not surgical candidate Will need care coordination as an outpatient MRI does show circumscribed renal cell lesion and a risk-benefit discussion should he performed as an outpatient by urologist with the patient and Dr. Tresa Moore is aware  DM  type 2 - stable  Patient on metformin at home, will hold for now given AKI Placed on sliding scale and monitor CBG Metformin resumed on discharge  AKI - ? CKD component as well  unknown stage - Cr stable  Patient was treated with aggressive hydration - IVF d/ced due to mild overload  Cr at baseline  Monitor kidney function in AM   Acute on Chronic systolic  dysfunction EF 78-29% in 2014 - New ECHO with severe decrease EF 20-25%  Mild exacerbation due to aggressive hydration - SOB and bibasilar crackles  Coreg resumed - further management per card recommendations  I/0 -560 only  Hyperkalemia - Resolved  Was treated with Lasix and IV fluids Started on lower dose of lisinopril 2.5 this admission  Back pain - MSK  OOB and pain control PRN   Procedures: CATH Conclusion   Severe diffuse native coronary occlusive disease.  Saphenous vein graft failure.  Total occlusion of the saphenous vein sequential graft to the PDA and PL branch.  Total occlusion of the saphenous vein graft to the ramus intermedius.  Patent LIMA to the LAD mid to distal segment.  Total occlusion of the proximal LAD. 80-90% distal left main.  Ostial 90% circumflex stenosis. 90% ostial stenosis of the first obtuse marginal.  70% ostial and 75% mid ramus intermedius stenosis.  Segmental 90-95% ostial to proximal RCA stenosis. High-grade obstruction, greater than 90% in the mid body of the PDA and 95% obstruction in the PL branch.  Acute on chronic systolic heart failure with left ventricular end-diastolic pressure of 22 mmHg. Echocardiographic ejection fraction 20-25%.   RECOMMENDATIONS:    Will discuss with treatment team. In setting of recurrent anemia due to GI blood loss and with other comorbidities, revascularization strategies are limited.  May need to proceed with diagnosis and treatment of kidney mass without revascularization. Will be at high risk for general anesthesia related ischemic complications. Overall, anticipated poor prognosis.     (i.e. Studies not automatically included, echos, thoracentesis, etc; not x-rays)  Consultations: ECHO Study Conclusions   - Left ventricle: The cavity size was normal. There was mild focal   basal hypertrophy of the septum. Systolic function was severely   reduced. The estimated ejection fraction was in the  range of 20%   to 25%. Images were inadequate for LV wall motion assessment. - Mitral valve: There was mild regurgitation. - Left atrium: The atrium was mildly to moderately dilated. - Right ventricle: The cavity size was mildly dilated. Wall   thickness was normal. - Tricuspid valve: There was moderate regurgitation. - Pulmonary arteries: PA peak pressure: 52 mm Hg (S).   Impressions:   - The right ventricular systolic pressure was increased consistent   with moderate pulmonary hypertension.    Discharge Exam: Vitals:   11/16/16 0446 11/16/16 0829  BP: (!) 90/58   Pulse: 92 91  Resp: 18 19  Temp: 98 F (36.7 C) 98.2 F (36.8 C)  SpO2: 98% 98%    Alert pleasant oriented no distress sitting up in chair seems fine Chest is clear no added sound Abdomen soft nontender no rebound F6-O1 holosystolic murmur   Discharge Instructions   Discharge Instructions    Amb Referral to Cardiac Rehabilitation    Complete by:  As directed    Diagnosis:  NSTEMI     Current Discharge Medication List    CONTINUE these medications which have CHANGED   Details  lisinopril (PRINIVIL,ZESTRIL) 2.5 MG tablet Take 1 tablet (2.5  mg total) by mouth daily. Qty: 30 tablet, Refills: 0      CONTINUE these medications which have NOT CHANGED   Details  aspirin 81 MG EC tablet Take 1 tablet (81 mg total) by mouth daily.    atorvastatin (LIPITOR) 40 MG tablet Take 1 tablet (40 mg total) by mouth daily. Qty: 30 tablet, Refills: 6    carvedilol (COREG) 3.125 MG tablet Take 1 tablet (3.125 mg total) by mouth 2 (two) times daily. Qty: 180 tablet, Refills: 2    ferrous sulfate 325 (65 FE) MG tablet Take 325 mg by mouth daily with breakfast.    metFORMIN (GLUCOPHAGE) 1000 MG tablet Take 1,000 mg by mouth 2 (two) times daily.  Refills: 0       No Known Allergies    The results of significant diagnostics from this hospitalization (including imaging, microbiology, ancillary and laboratory) are  listed below for reference.    Significant Diagnostic Studies: Ct Abdomen Pelvis Wo Contrast  Result Date: 11/12/2016 CLINICAL DATA:  Nausea and shortness of Breath EXAM: CT ABDOMEN AND PELVIS WITHOUT CONTRAST TECHNIQUE: Multidetector CT imaging of the abdomen and pelvis was performed following the standard protocol without IV contrast. COMPARISON:  None. FINDINGS: Lower chest: Small bilateral pleural effusions are noted. Mild bibasilar atelectatic changes are seen. There are nodular changes identified in the right lower lobe as well as some smaller scattered nodules seen. The largest of these measures 15 mm on image number 80 of series 3. These are similar to that seen on recent chest CT from earlier in the same day. Hepatobiliary: No focal liver abnormality is seen. No gallstones, gallbladder wall thickening, or biliary dilatation. Pancreas: Unremarkable. No pancreatic ductal dilatation or surrounding inflammatory changes. Spleen: Normal in size without focal abnormality. Adrenals/Urinary Tract: The adrenal glands are within normal limits. The right kidney is unremarkable. The left kidney demonstrates a large mixed attenuation mass lesion which measures approximately the 11 cm in greatest dimension. This is similar to that seen on the prior CT of the chest. These changes are consistent with renal cell carcinoma. The bladder is within normal limits. Stomach/Bowel: Stomach is within normal limits. Appendix appears normal. No evidence of bowel wall thickening, distention, or inflammatory changes. Vascular/Lymphatic: Aortic atherosclerosis. No enlarged abdominal or pelvic lymph nodes. Reproductive: Prostate is enlarged in size. Other: No abdominal wall hernia or abnormality. No abdominopelvic ascites. Musculoskeletal: Degenerative changes of lumbar spine are noted. IMPRESSION: Large left renal mass consistent with renal cell carcinoma. There are changes consistent with metastatic disease in the lungs. Pleural  effusions are also noted. Electronically Signed   By: Inez Catalina M.D.   On: 11/12/2016 16:52   Dg Chest 2 View  Result Date: 11/11/2016 CLINICAL DATA:  Shortness of breath and cough. EXAM: CHEST  2 VIEW COMPARISON:  08/07/2012 FINDINGS: Low volume film. Cardiopericardial silhouette is at upper limits of normal for size. 1 cm nodular density right upper lobe is new in the interval. No pulmonary edema or pleural effusion. Patient is status post CABG. IMPRESSION: Low volume film with new 10 mm right upper lobe pulmonary nodule. CT chest without contrast recommended to further evaluate. Electronically Signed   By: Misty Stanley M.D.   On: 11/11/2016 14:44   Ct Chest Wo Contrast  Result Date: 11/12/2016 CLINICAL DATA:  Abnormal chest radiograph, pulmonary nodule EXAM: CT CHEST WITHOUT CONTRAST TECHNIQUE: Multidetector CT imaging of the chest was performed following the standard protocol without IV contrast. COMPARISON:  Chest radiographs dated 11/11/2016 FINDINGS:  Cardiovascular: Heart is top-normal in size. No pericardial effusion. No evidence of thoracic aortic aneurysm. Atherosclerotic calcifications aortic arch. Three vessel coronary atherosclerosis. Postsurgical changes related to prior CABG. Mediastinum/Nodes: 15 mm short axis subcarinal node. Additional small mediastinal nodes which not meet pathologic CT size criteria. Visualized thyroid is unremarkable. Lungs/Pleura: Multiple bilateral pulmonary nodules, approximately 12-15 in number, most of which are in the right lung, suspicious for metastases. Dominant nodules include: --10 mm nodule in the posterior left upper lobe (series 5/image 51) --19 mm nodule in the right upper lobe along the minor fissure (series 5/ image 60) --18 mm nodule in the posterior right lower lobe (series 5/ image 82) No focal consolidation. Mild patchy left lower lobe opacity, likely atelectasis. Small bilateral pleural effusions. No pneumothorax. Upper Abdomen: Visualized upper  abdomen is notable for a 9.1 x 11.6 cm left renal mass, incompletely visualized/evaluated, compatible with renal cell carcinoma. Musculoskeletal: Mild degenerative changes the visualized thoracolumbar spine. Median sternotomy. No suspicious osseous lesions. IMPRESSION: Multiple bilateral pulmonary nodules, suspicious for metastases, measuring up to 19 mm in the right upper lobe. 15 mm short axis subcarinal node, indeterminate. 9.1 x 11.6 cm left renal mass, incompletely visualized/evaluated, compatible with renal cell carcinoma. This likely reflects the primary malignancy. Small bilateral pleural effusions. Aortic Atherosclerosis (ICD10-I70.0). Electronically Signed   By: Julian Hy M.D.   On: 11/12/2016 10:29   Mr Abdomen W Wo Contrast  Result Date: 11/16/2016 CLINICAL DATA:  71 year old male with history of renal mass noted on recent CT examination. Followup evaluation. EXAM: MRI ABDOMEN WITHOUT AND WITH CONTRAST TECHNIQUE: Multiplanar multisequence MR imaging of the abdomen was performed both before and after the administration of intravenous contrast. CONTRAST:  13mL MULTIHANCE GADOBENATE DIMEGLUMINE 529 MG/ML IV SOLN COMPARISON:  No priors. FINDINGS: Lower chest: Small bilateral pleural effusions lying dependently. Susceptibility artifact over the lower sternum, related to median sternotomy wires. Hepatobiliary: No discrete cystic or solid hepatic lesions. No intra or extrahepatic biliary ductal dilatation. Gallbladder is normal in appearance. Pancreas: There are 3 small T1 hypointense, T2 hyperintense, nonenhancing lesions in the head of the pancreas and proximal body of the pancreas measuring up to 9 mm (axial image 15 of series 5), favored to represent small pancreatic pseudocysts. No larger pancreatic mass. No pancreatic ductal dilatation. No pancreatic or peripancreatic fluid or inflammatory changes. Spleen:  Unremarkable. Adrenals/Urinary Tract: The lesion of concern in the upper pole of the  left kidney is heterogeneous in signal intensity on T1 and T2 weighted images, and demonstrates extensive but heterogeneous predominantly peripheral enhancement on post gadolinium images. This lesion measures approximately 11.8 x 9.7 x 9.9 cm (axial image 70 of series 1102 and coronal image 24 of series 12) and appears likely encapsulated within Gerota's fascia at this time. The lesion comes in proximity to the left renal hilum, without definite extension into the left renal vein at this time. Left renal vein is widely patent. Right kidney and bilateral adrenal glands are normal in appearance. There is no hydroureteronephrosis in the visualized portions of the abdomen. Stomach/Bowel: Visualized portions are unremarkable. Vascular/Lymphatic: Aortic atherosclerosis without evidence of aneurysm in the abdominal vasculature. Left renal vein appears patent without definite tumor thrombus. IVC is widely patent. No lymphadenopathy noted in the abdomen. Other: No significant volume of ascites noted in the visualized portions of the peritoneal cavity. Musculoskeletal: No definite aggressive appearing osseous lesions are noted in the visualized portions of the skeleton. IMPRESSION: 1. 11.8 x 9.7 x 9.9 cm heterogeneously enhancing left renal  mass centered in the upper pole of the left kidney with extension toward the left renal hilum, without definite left renal vein invasion. This lesion appears likely encapsulated within Gerota's fascia at this time. There is no associated lymphadenopathy and no definite signs of metastatic disease in the abdomen. 2. Small bilateral pleural effusions lying dependently. 3. Tiny cystic lesions in the head and proximal body of the pancreas measuring 9 mm or less in size. These are nonspecific. Follow-up MRI of the abdomen with and without IV gadolinium with MRCP is recommended in 2 years to ensure the stability these lesions. This recommendation follows ACR consensus guidelines: Management of  Incidental Pancreatic Cysts: A White Paper of the ACR Incidental Findings Committee. J Am Coll Radiol 1700;17:494-496. 4. Aortic atherosclerosis. Aortic Atherosclerosis (ICD10-I70.0). Electronically Signed   By: Vinnie Langton M.D.   On: 11/16/2016 09:08    Microbiology: Recent Results (from the past 240 hour(s))  MRSA PCR Screening     Status: None   Collection Time: 11/12/16  2:01 PM  Result Value Ref Range Status   MRSA by PCR NEGATIVE NEGATIVE Final    Comment:        The GeneXpert MRSA Assay (FDA approved for NASAL specimens only), is one component of a comprehensive MRSA colonization surveillance program. It is not intended to diagnose MRSA infection nor to guide or monitor treatment for MRSA infections.      Labs: Basic Metabolic Panel:  Recent Labs Lab 11/11/16 1418 11/12/16 0527 11/13/16 0054 11/14/16 0307 11/14/16 1832 11/15/16 0340  NA 135 136 135 134*  --  135  K 5.1 5.7* 4.9 4.5  --  4.8  CL 105 108 107 103  --  103  CO2 19* 20* 19* 22  --  24  GLUCOSE 154* 147* 163* 143*  --  118*  BUN 27* 28* 21* 18  --  15  CREATININE 1.76* 1.55* 1.15 1.17 1.06 1.09  CALCIUM 9.0 8.8* 9.0 9.0  --  8.9   Liver Function Tests:  Recent Labs Lab 11/13/16 0054  AST 29  ALT 18  ALKPHOS 92  BILITOT 0.8  PROT 6.8  ALBUMIN 3.3*   No results for input(s): LIPASE, AMYLASE in the last 168 hours. No results for input(s): AMMONIA in the last 168 hours. CBC:  Recent Labs Lab 11/12/16 0527 11/13/16 0054 11/13/16 1515 11/14/16 0307 11/14/16 1832 11/15/16 0340  WBC 12.4* 12.3* 11.8* 9.6 9.8 7.5  NEUTROABS 10.4* 9.8*  --   --   --   --   HGB 7.8* 7.8* 9.0* 9.1* 9.0* 8.4*  HCT 25.1* 24.6* 28.7* 28.9* 28.9* 27.7*  MCV 71.5* 72.4* 73.4* 73.5* 74.3* 74.5*  PLT 328 322 320 294 304 280   Cardiac Enzymes:  Recent Labs Lab 11/11/16 1728 11/11/16 2320 11/12/16 0527 11/13/16 0054  TROPONINI 2.75* 2.14* 3.49* 3.14*   BNP: BNP (last 3 results) No results for  input(s): BNP in the last 8760 hours.  ProBNP (last 3 results) No results for input(s): PROBNP in the last 8760 hours.  CBG:  Recent Labs Lab 11/15/16 0626 11/15/16 1257 11/15/16 1748 11/15/16 2101 11/16/16 0648  GLUCAP 118* 151* 139* 144* 155*       Signed:  Nita Sells MD   [facility] 11/16/2016, 10:38 AM

## 2016-11-20 ENCOUNTER — Other Ambulatory Visit (HOSPITAL_COMMUNITY): Payer: Medicare Other

## 2016-11-20 ENCOUNTER — Emergency Department (HOSPITAL_COMMUNITY): Payer: Medicare Other

## 2016-11-20 ENCOUNTER — Inpatient Hospital Stay (HOSPITAL_COMMUNITY)
Admission: EM | Admit: 2016-11-20 | Discharge: 2016-11-22 | DRG: 061 | Disposition: A | Discharge status: Home / Self Care | Payer: Medicare Other | Attending: Neurology | Admitting: Neurology

## 2016-11-20 ENCOUNTER — Encounter (HOSPITAL_COMMUNITY): Payer: Self-pay | Admitting: Emergency Medicine

## 2016-11-20 DIAGNOSIS — Z87891 Personal history of nicotine dependence: Secondary | ICD-10-CM | POA: Diagnosis not present

## 2016-11-20 DIAGNOSIS — C642 Malignant neoplasm of left kidney, except renal pelvis: Secondary | ICD-10-CM | POA: Diagnosis present

## 2016-11-20 DIAGNOSIS — I255 Ischemic cardiomyopathy: Secondary | ICD-10-CM | POA: Diagnosis present

## 2016-11-20 DIAGNOSIS — I340 Nonrheumatic mitral (valve) insufficiency: Secondary | ICD-10-CM | POA: Diagnosis not present

## 2016-11-20 DIAGNOSIS — C7800 Secondary malignant neoplasm of unspecified lung: Secondary | ICD-10-CM | POA: Diagnosis present

## 2016-11-20 DIAGNOSIS — I63432 Cerebral infarction due to embolism of left posterior cerebral artery: Principal | ICD-10-CM | POA: Diagnosis present

## 2016-11-20 DIAGNOSIS — I5022 Chronic systolic (congestive) heart failure: Secondary | ICD-10-CM | POA: Diagnosis present

## 2016-11-20 DIAGNOSIS — I272 Pulmonary hypertension, unspecified: Secondary | ICD-10-CM | POA: Diagnosis present

## 2016-11-20 DIAGNOSIS — Z951 Presence of aortocoronary bypass graft: Secondary | ICD-10-CM | POA: Diagnosis not present

## 2016-11-20 DIAGNOSIS — I6523 Occlusion and stenosis of bilateral carotid arteries: Secondary | ICD-10-CM | POA: Diagnosis present

## 2016-11-20 DIAGNOSIS — R29707 NIHSS score 7: Secondary | ICD-10-CM | POA: Diagnosis present

## 2016-11-20 DIAGNOSIS — I493 Ventricular premature depolarization: Secondary | ICD-10-CM | POA: Diagnosis present

## 2016-11-20 DIAGNOSIS — I071 Rheumatic tricuspid insufficiency: Secondary | ICD-10-CM | POA: Diagnosis present

## 2016-11-20 DIAGNOSIS — Z8249 Family history of ischemic heart disease and other diseases of the circulatory system: Secondary | ICD-10-CM | POA: Diagnosis not present

## 2016-11-20 DIAGNOSIS — I639 Cerebral infarction, unspecified: Secondary | ICD-10-CM

## 2016-11-20 DIAGNOSIS — Z79899 Other long term (current) drug therapy: Secondary | ICD-10-CM | POA: Diagnosis not present

## 2016-11-20 DIAGNOSIS — I2510 Atherosclerotic heart disease of native coronary artery without angina pectoris: Secondary | ICD-10-CM | POA: Diagnosis present

## 2016-11-20 DIAGNOSIS — I361 Nonrheumatic tricuspid (valve) insufficiency: Secondary | ICD-10-CM | POA: Diagnosis not present

## 2016-11-20 DIAGNOSIS — I63412 Cerebral infarction due to embolism of left middle cerebral artery: Secondary | ICD-10-CM

## 2016-11-20 DIAGNOSIS — E119 Type 2 diabetes mellitus without complications: Secondary | ICD-10-CM | POA: Diagnosis present

## 2016-11-20 DIAGNOSIS — Z806 Family history of leukemia: Secondary | ICD-10-CM

## 2016-11-20 DIAGNOSIS — E785 Hyperlipidemia, unspecified: Secondary | ICD-10-CM | POA: Diagnosis present

## 2016-11-20 DIAGNOSIS — I6300 Cerebral infarction due to thrombosis of unspecified precerebral artery: Secondary | ICD-10-CM

## 2016-11-20 DIAGNOSIS — I214 Non-ST elevation (NSTEMI) myocardial infarction: Secondary | ICD-10-CM | POA: Diagnosis present

## 2016-11-20 DIAGNOSIS — Z7982 Long term (current) use of aspirin: Secondary | ICD-10-CM | POA: Diagnosis not present

## 2016-11-20 DIAGNOSIS — I69354 Hemiplegia and hemiparesis following cerebral infarction affecting left non-dominant side: Secondary | ICD-10-CM | POA: Diagnosis not present

## 2016-11-20 DIAGNOSIS — Z7984 Long term (current) use of oral hypoglycemic drugs: Secondary | ICD-10-CM | POA: Diagnosis not present

## 2016-11-20 LAB — LIPID PANEL
CHOL/HDL RATIO: 2.6 ratio
CHOLESTEROL: 77 mg/dL (ref 0–200)
HDL: 30 mg/dL — AB (ref >40)
LDL Cholesterol: 34 mg/dL (ref 0–99)
Triglycerides: 64 mg/dL (ref <150)
VLDL: 13 mg/dL (ref 0–40)

## 2016-11-20 LAB — URINALYSIS, ROUTINE W REFLEX MICROSCOPIC
Bilirubin Urine: NEGATIVE
GLUCOSE, UA: NEGATIVE mg/dL
HGB URINE DIPSTICK: NEGATIVE
Ketones, ur: NEGATIVE mg/dL
LEUKOCYTES UA: NEGATIVE
Nitrite: NEGATIVE
PH: 5 (ref 5.0–8.0)
Protein, ur: NEGATIVE mg/dL
SPECIFIC GRAVITY, URINE: 1.034 — AB (ref 1.005–1.030)

## 2016-11-20 LAB — GLUCOSE, CAPILLARY
Glucose-Capillary: 129 mg/dL — ABNORMAL HIGH (ref 65–99)
Glucose-Capillary: 129 mg/dL — ABNORMAL HIGH (ref 65–99)
Glucose-Capillary: 176 mg/dL — ABNORMAL HIGH (ref 65–99)
Glucose-Capillary: 115 mg/dL — ABNORMAL HIGH (ref 65–99)

## 2016-11-20 LAB — I-STAT CHEM 8, ED
BUN: 14 mg/dL (ref 6–20)
CALCIUM ION: 1.13 mmol/L — AB (ref 1.15–1.40)
CREATININE: 1 mg/dL (ref 0.61–1.24)
Chloride: 105 mmol/L (ref 101–111)
GLUCOSE: 169 mg/dL — AB (ref 65–99)
HCT: 30 % — ABNORMAL LOW (ref 39.0–52.0)
Hemoglobin: 10.2 g/dL — ABNORMAL LOW (ref 13.0–17.0)
Potassium: 5 mmol/L (ref 3.5–5.1)
Sodium: 136 mmol/L (ref 135–145)
TCO2: 23 mmol/L (ref 22–32)

## 2016-11-20 LAB — COMPREHENSIVE METABOLIC PANEL
ALT: 18 U/L (ref 17–63)
AST: 21 U/L (ref 15–41)
Albumin: 3.3 g/dL — ABNORMAL LOW (ref 3.5–5.0)
Alkaline Phosphatase: 94 U/L (ref 38–126)
Anion gap: 10 (ref 5–15)
BUN: 11 mg/dL (ref 6–20)
CHLORIDE: 106 mmol/L (ref 101–111)
CO2: 20 mmol/L — AB (ref 22–32)
CREATININE: 1.04 mg/dL (ref 0.61–1.24)
Calcium: 9.3 mg/dL (ref 8.9–10.3)
GFR calc non Af Amer: >60 mL/min (ref >60)
Glucose, Bld: 167 mg/dL — ABNORMAL HIGH (ref 65–99)
POTASSIUM: 4.9 mmol/L (ref 3.5–5.1)
Sodium: 136 mmol/L (ref 135–145)
Total Bilirubin: 0.5 mg/dL (ref 0.3–1.2)
Total Protein: 6.4 g/dL — ABNORMAL LOW (ref 6.5–8.1)

## 2016-11-20 LAB — CBC
HCT: 29.9 % — ABNORMAL LOW (ref 39.0–52.0)
HCT: 28.7 % — ABNORMAL LOW (ref 39.0–52.0)
Hemoglobin: 9 g/dL — ABNORMAL LOW (ref 13.0–17.0)
Hemoglobin: 8.7 g/dL — ABNORMAL LOW (ref 13.0–17.0)
MCH: 23.3 pg — AB (ref 26.0–34.0)
MCH: 23.5 pg — AB (ref 26.0–34.0)
MCHC: 30.1 g/dL (ref 30.0–36.0)
MCHC: 30.3 g/dL (ref 30.0–36.0)
MCV: 77.5 fL — ABNORMAL LOW (ref 78.0–100.0)
MCV: 77.4 fL — AB (ref 78.0–100.0)
PLATELETS: 258 10*3/uL (ref 150–400)
PLATELETS: 257 10*3/uL (ref 150–400)
RBC: 3.86 MIL/uL — AB (ref 4.22–5.81)
RBC: 3.71 MIL/uL — ABNORMAL LOW (ref 4.22–5.81)
RDW: 23.4 % — AB (ref 11.5–15.5)
RDW: 23.6 % — AB (ref 11.5–15.5)
WBC: 10.7 10*3/uL — ABNORMAL HIGH (ref 4.0–10.5)
WBC: 9.5 10*3/uL (ref 4.0–10.5)

## 2016-11-20 LAB — ETHANOL: Alcohol, Ethyl (B): <5 mg/dL (ref <5)

## 2016-11-20 LAB — RAPID URINE DRUG SCREEN, HOSP PERFORMED
Amphetamines: NOT DETECTED
BARBITURATES: NOT DETECTED
BENZODIAZEPINES: NOT DETECTED
COCAINE: NOT DETECTED
OPIATES: NOT DETECTED
Tetrahydrocannabinol: NOT DETECTED

## 2016-11-20 LAB — MRSA PCR SCREENING: MRSA by PCR: NEGATIVE

## 2016-11-20 LAB — DIFFERENTIAL
Basophils Absolute: 0 10*3/uL (ref 0.0–0.1)
Basophils Relative: 0 %
EOS ABS: 0.4 10*3/uL (ref 0.0–0.7)
Eosinophils Relative: 4 %
LYMPHS PCT: 9 %
Lymphs Abs: 1 10*3/uL (ref 0.7–4.0)
MONO ABS: 0.8 10*3/uL (ref 0.1–1.0)
Monocytes Relative: 7 %
NEUTROS ABS: 8.5 10*3/uL — AB (ref 1.7–7.7)
NEUTROS PCT: 80 %

## 2016-11-20 LAB — PROTIME-INR
INR: 1
Prothrombin Time: 13.1 seconds (ref 11.4–15.2)

## 2016-11-20 LAB — HEMOGLOBIN A1C
Hgb A1c MFr Bld: 5.9 % — ABNORMAL HIGH (ref 4.8–5.6)
MEAN PLASMA GLUCOSE: 122.63 mg/dL

## 2016-11-20 LAB — APTT: aPTT: 31 seconds (ref 24–36)

## 2016-11-20 LAB — I-STAT TROPONIN, ED: TROPONIN I, POC: 0.12 ng/mL — AB (ref 0.00–0.08)

## 2016-11-20 MED ORDER — ALTEPLASE (STROKE) FULL DOSE INFUSION
0.9000 mg/kg | Freq: Once | INTRAVENOUS | Status: AC
  Administered 2016-11-20: 73 mg via INTRAVENOUS
  Filled 2016-11-20: qty 100

## 2016-11-20 MED ORDER — SODIUM CHLORIDE 0.9 % IV SOLN
INTRAVENOUS | Status: DC
  Administered 2016-11-20 – 2016-11-21 (×2): via INTRAVENOUS

## 2016-11-20 MED ORDER — ORAL CARE MOUTH RINSE
15.0000 mL | Freq: Two times a day (BID) | OROMUCOSAL | Status: DC
  Administered 2016-11-20: 15 mL via OROMUCOSAL

## 2016-11-20 MED ORDER — PANTOPRAZOLE SODIUM 40 MG PO TBEC
40.0000 mg | DELAYED_RELEASE_TABLET | Freq: Every day | ORAL | Status: DC
  Administered 2016-11-21 (×2): 40 mg via ORAL
  Filled 2016-11-20 (×2): qty 1

## 2016-11-20 MED ORDER — ACETAMINOPHEN 160 MG/5ML PO SOLN: 650.0000 mg | ORAL | Status: DC | PRN

## 2016-11-20 MED ORDER — ACETAMINOPHEN 650 MG RE SUPP: 650.0000 mg | RECTAL | Status: DC | PRN

## 2016-11-20 MED ORDER — PANTOPRAZOLE SODIUM 40 MG IV SOLR
40.0000 mg | Freq: Every day | INTRAVENOUS | Status: DC
  Administered 2016-11-20: 40 mg via INTRAVENOUS
  Filled 2016-11-20: qty 40

## 2016-11-20 MED ORDER — IOPAMIDOL (ISOVUE-370) INJECTION 76%
50.0000 mL | Freq: Once | INTRAVENOUS | Status: AC | PRN
  Administered 2016-11-20: 50 mL via INTRAVENOUS

## 2016-11-20 MED ORDER — INSULIN ASPART 100 UNIT/ML ~~LOC~~ SOLN
0.0000 [IU] | Freq: Three times a day (TID) | SUBCUTANEOUS | Status: DC
  Administered 2016-11-20: 2 [IU] via SUBCUTANEOUS
  Administered 2016-11-20 – 2016-11-22 (×5): 1 [IU] via SUBCUTANEOUS

## 2016-11-20 MED ORDER — CHLORHEXIDINE GLUCONATE 0.12 % MT SOLN
15.0000 mL | Freq: Two times a day (BID) | OROMUCOSAL | Status: DC
  Administered 2016-11-20: 15 mL via OROMUCOSAL

## 2016-11-20 MED ORDER — ACETAMINOPHEN 325 MG PO TABS: 650.0000 mg | ORAL_TABLET | ORAL | Status: DC | PRN

## 2016-11-20 MED ORDER — STROKE: EARLY STAGES OF RECOVERY BOOK
Freq: Once | Status: AC
  Administered 2016-11-20: 1
  Filled 2016-11-20 (×2): qty 1

## 2016-11-20 MED ORDER — INSULIN ASPART 100 UNIT/ML ~~LOC~~ SOLN
0.0000 [IU] | Freq: Every day | SUBCUTANEOUS | Status: DC
  Administered 2016-11-21: 2 [IU] via SUBCUTANEOUS

## 2016-11-20 MED ORDER — ATORVASTATIN CALCIUM 40 MG PO TABS
40.0000 mg | ORAL_TABLET | Freq: Every day | ORAL | Status: DC
  Administered 2016-11-20 – 2016-11-21 (×2): 40 mg via ORAL
  Filled 2016-11-20 (×2): qty 1

## 2016-11-20 NOTE — Evaluation (Signed)
Clinical/Bedside Swallow Evaluation Patient Details  Name: Trevor [name] MRN: [id number] Date of Birth: 03/05/1946  Today's Date: 11/20/2016 Time: SLP Start Time (ACUTE ONLY): 6606 SLP Stop Time (ACUTE ONLY): 0905 SLP Time Calculation (min) (ACUTE ONLY): 12 min  Past Medical History:  Past Medical History:  Diagnosis Date  . Anemia   . Coronary artery disease    06/2012: Anterior ST elevation myocardial infarction. Cardiac catheterization showed significant three-vessel coronary artery disease, ejection fraction 35-40%. The patient underwent CABG with LIMA to LAD, SVG to ramus and sequential SVG to right PDA/PL  . Diabetes mellitus without complication ([place])   . Hyperlipidemia   . Ischemic cardiomyopathy    Ejection fraction of 35-40% initially, 45 - 50% on echo 2014, 53% by perfusion study 2016   Past Surgical History:  Past Surgical History:  Procedure Laterality Date  . CORONARY ARTERY BYPASS GRAFT N/A 07/05/2012   Procedure: CORONARY ARTERY BYPASS GRAFTING (CABG);  Surgeon: Ivin Poot, MD;  Location: [place];  Service: Open Heart Surgery;  Laterality: N/A;  . INTRAOPERATIVE TRANSESOPHAGEAL ECHOCARDIOGRAM N/A 07/05/2012   Procedure: INTRAOPERATIVE TRANSESOPHAGEAL ECHOCARDIOGRAM;  Surgeon: Ivin Poot, MD;  Location: [place];  Service: Open Heart Surgery;  Laterality: N/A;  . LEFT HEART CATH AND CORS/GRAFTS ANGIOGRAPHY N/A 11/14/2016   Procedure: LEFT HEART CATH AND CORS/GRAFTS ANGIOGRAPHY;  Surgeon: Belva Crome, MD;  Location: [place] [facility];  Service: Cardiovascular;  Laterality: N/A;  . LEFT HEART CATHETERIZATION WITH CORONARY ANGIOGRAM N/A 07/04/2012   Procedure: LEFT HEART CATHETERIZATION WITH CORONARY ANGIOGRAM;  Surgeon: Wellington Hampshire, MD;  Location: [place] [facility];  Service: Cardiovascular;  Laterality: N/A;   HPI:  Trevor [name] an 71 y.o.malerecently discharged from hospital with daignosis of renal cell carcinoma with lung mets, acute on chronic heart failure with  low EF, DM II, HLD, CAD presents as a stroke alert for sudden onset R side weakness, s/p TPA.    Assessment / Plan / Recommendation Clinical Impression  Patient presents with normal swallowing function. Able to consume 3 ounces thin liquids (H2O) via consecutive boluses and solids without overt indication of aspiration or evidence of dysphagia. No further SLP needs indicated.  SLP Visit Diagnosis: Dysphagia, unspecified (R13.10)    Aspiration Risk  No limitations    Diet Recommendation Regular;Thin liquid   Liquid Administration via: Cup;Straw Medication Administration: Whole meds with liquid Supervision: Patient able to self feed Postural Changes: Seated upright at 90 degrees    Other  Recommendations Oral Care Recommendations: Oral care BID   Follow up Recommendations None           Swallow Study   General HPI: Trevor [name] an 71 y.o.malerecently discharged from hospital with daignosis of renal cell carcinoma with lung mets, acute on chronic heart failure with low EF, DM II, HLD, CAD presents as a stroke alert for sudden onset R side weakness, s/p TPA.  Type of Study: Bedside Swallow Evaluation Previous Swallow Assessment: none Diet Prior to this Study: Regular;Thin liquids Temperature Spikes Noted: No Respiratory Status: Room air History of Recent Intubation: No Behavior/Cognition: Alert;Cooperative;Pleasant mood Oral Cavity Assessment: Within Functional Limits Oral Care Completed by SLP: No Oral Cavity - Dentition: Adequate natural dentition Vision: Functional for self-feeding Self-Feeding Abilities: Able to feed self Patient Positioning: Upright in bed Baseline Vocal Quality: Normal Volitional Cough: Strong Volitional Swallow: Able to elicit    Oral/Motor/Sensory Function Overall Oral Motor/Sensory Function: Within functional limits   Ice Chips Ice chips: Not tested   Thin  Liquid Thin Liquid: Within functional limits Presentation: Cup;Straw;Self Fed     Nectar Thick Nectar Thick Liquid: Not tested   Honey Thick Honey Thick Liquid: Not tested   Puree Puree: Not tested   Solid   Trevor Winegar MA, CCC-SLP [phone]    Solid: Within functional limits Presentation: Self Fed        Trevor [name] 11/20/2016,9:31 AM

## 2016-11-20 NOTE — ED Provider Notes (Signed)
[facility] DEPT Provider Note   CSN: [id number] Arrival date & time: 11/20/16  0117     History   Chief Complaint No chief complaint on file.   HPI Trevor [name] is a 71 y.o. male.  Patient is a 71 year old male with past medical history of diabetes, coronary artery disease, ischemic cardiomyopathy. He presents today for evaluation of right arm and right leg weakness, decreased coordination. This reportedly started at 11:00 this evening. Family called 71 and the patient was brought here for evaluation. He seems somewhat confused and adds little additional history.   The history is provided by the patient and the EMS personnel.    Past Medical History:  Diagnosis Date  . Anemia   . Coronary artery disease    06/2012: Anterior ST elevation myocardial infarction. Cardiac catheterization showed significant three-vessel coronary artery disease, ejection fraction 35-40%. The patient underwent CABG with LIMA to LAD, SVG to ramus and sequential SVG to right PDA/PL  . Diabetes mellitus without complication ([place])   . Hyperlipidemia   . Ischemic cardiomyopathy    Ejection fraction of 35-40% initially, 45 - 50% on echo 2014, 53% by perfusion study 2016    Patient Active Problem List   Diagnosis Date Noted  . NSTEMI (non-ST elevated myocardial infarction) ([place])   . Anemia 11/11/2016  . Iron deficiency anemia due to chronic blood loss 09/20/2015  . Recent weight loss 09/20/2015  . Bilateral leg pain 02/21/2015  . Coronary artery disease   . Ischemic cardiomyopathy   . Hyperlipidemia   . ST elevation myocardial infarction (STEMI) of anterior wall, initial episode of care ([place]) 07/04/2012  . Hyperglycemia 07/04/2012    Past Surgical History:  Procedure Laterality Date  . CORONARY ARTERY BYPASS GRAFT N/A 07/05/2012   Procedure: CORONARY ARTERY BYPASS GRAFTING (CABG);  Surgeon: Ivin Poot, MD;  Location: [facility];  Service: Open Heart Surgery;  Laterality: N/A;  . INTRAOPERATIVE  TRANSESOPHAGEAL ECHOCARDIOGRAM N/A 07/05/2012   Procedure: INTRAOPERATIVE TRANSESOPHAGEAL ECHOCARDIOGRAM;  Surgeon: Ivin Poot, MD;  Location: [facility];  Service: Open Heart Surgery;  Laterality: N/A;  . LEFT HEART CATH AND CORS/GRAFTS ANGIOGRAPHY N/A 11/14/2016   Procedure: LEFT HEART CATH AND CORS/GRAFTS ANGIOGRAPHY;  Surgeon: Belva Crome, MD;  Location: [facility];  Service: Cardiovascular;  Laterality: N/A;  . LEFT HEART CATHETERIZATION WITH CORONARY ANGIOGRAM N/A 07/04/2012   Procedure: LEFT HEART CATHETERIZATION WITH CORONARY ANGIOGRAM;  Surgeon: Wellington Hampshire, MD;  Location: [facility];  Service: Cardiovascular;  Laterality: N/A;       Home Medications    Prior to Admission medications   Medication Sig Start Date End Date Taking? Authorizing Provider  aspirin 81 MG EC tablet Take 1 tablet (81 mg total) by mouth daily. 09/24/15   Wellington Hampshire, MD  atorvastatin (LIPITOR) 40 MG tablet Take 1 tablet (40 mg total) by mouth daily. 05/20/13   Wellington Hampshire, MD  carvedilol (COREG) 3.125 MG tablet Take 1 tablet (3.125 mg total) by mouth 2 (two) times daily. 08/24/15   Wellington Hampshire, MD  ferrous sulfate 325 (65 FE) MG tablet Take 325 mg by mouth daily with breakfast.    [provider]  lisinopril (PRINIVIL,ZESTRIL) 2.5 MG tablet Take 1 tablet (2.5 mg total) by mouth daily. 11/16/16   Nita Sells, MD  metFORMIN (GLUCOPHAGE) 1000 MG tablet Take 1,000 mg by mouth 2 (two) times daily.  11/19/15   [provider]    Family History Family History  Problem Relation Age of Onset  . Cancer Mother        leukemia  . Cancer Maternal Grandfather        possible cancer  . Heart attack Brother 37    Social History Social History  Substance Use Topics  . Smoking status: Former Smoker    Packs/day: 1.00    Years: 50.00    Quit date: 04/20/2012  . Smokeless tobacco: Never Used  . Alcohol use No     Allergies   Patient has no known  allergies.   Review of Systems Review of Systems  All other systems reviewed and are negative.    Physical Exam Updated Vital Signs There were no vitals taken for this visit.  Physical Exam  Constitutional: He is oriented to person, place, and time. He appears well-developed and well-nourished. No distress.  HENT:  Head: Normocephalic and atraumatic.  Mouth/Throat: Oropharynx is clear and moist.  Neck: Normal range of motion. Neck supple.  Cardiovascular: Normal rate and regular rhythm.  Exam reveals no friction rub.   No murmur heard. Pulmonary/Chest: Effort normal and breath sounds normal. No respiratory distress. He has no wheezes. He has no rales.  Abdominal: Soft. Bowel sounds are normal. He exhibits no distension. There is no tenderness.  Musculoskeletal: Normal range of motion. He exhibits no edema.  Neurological: He is alert and oriented to person, place, and time. No cranial nerve deficit. He exhibits abnormal muscle tone. Coordination normal.  There is decreased strength noted with the right arm and right leg.  Skin: Skin is warm and dry. He is not diaphoretic.  Nursing note and vitals reviewed.    ED Treatments / Results  Labs (all labs ordered are listed, but only abnormal results are displayed) Labs Reviewed  ETHANOL  PROTIME-INR  APTT  CBC  DIFFERENTIAL  COMPREHENSIVE METABOLIC PANEL  RAPID URINE DRUG SCREEN, HOSP PERFORMED  URINALYSIS, ROUTINE W REFLEX MICROSCOPIC  I-STAT CHEM 8, ED  I-STAT TROPONIN, ED    EKG  EKG Interpretation None       Radiology No results found.  Procedures Procedures (including critical care time)  Medications Ordered in ED Medications - No data to display   Initial Impression / Assessment and Plan / ED Course  I have reviewed the triage vital signs and the nursing notes.  Pertinent labs & imaging results that were available during my care of the patient were reviewed by me and considered in my medical decision  making (see chart for details).  Patient presents here with right arm and right leg weakness. He arrived as a code stroke by EMS. He was initially assessed at the ambulance bay immediately upon arrival and sent for a CT of the head. This revealed lacunar infarcts, however no obvious acute CVA.   He was seen along with Dr. Lorraine Lax from neurology who is decided to admit the patient for TPA forward appears to be an acute ischemic stroke.  CRITICAL CARE Performed by: Veryl Speak Total critical care time: 30 minutes Critical care time was exclusive of separately billable procedures and treating other patients. Critical care was necessary to treat or prevent imminent or life-threatening deterioration. Critical care was time spent personally by me on the following activities: development of treatment plan with patient and/or surrogate as well as nursing, discussions with consultants, evaluation of patient's response to treatment, examination of patient, obtaining history from patient or surrogate, ordering and performing treatments and interventions, ordering and review of laboratory studies, ordering and review  of radiographic studies, pulse oximetry and re-evaluation of patient's condition.   Final Clinical Impressions(s) / ED Diagnoses   Final diagnoses:  None    New Prescriptions New Prescriptions   No medications on file     Veryl Speak, MD 11/20/16 [phone]

## 2016-11-20 NOTE — Progress Notes (Signed)
OT Cancellation Note  Patient Details Name: Trevor [name] MRN: [id number] DOB: 08-01-1945   Cancelled Treatment:    Reason Eval/Treat Not Completed: Patient not medically ready. Pt with active bedrest orders. Will await increase in activity orders prior to initiating OT evaluation. Thank you for this referral!   Norman Herrlich, MS OTR/L  Pager: [place] 11/20/2016, 7:27 AM

## 2016-11-20 NOTE — Progress Notes (Signed)
STROKE TEAM PROGRESS NOTE   HISTORY OF PRESENT ILLNESS (per record) Trevor [name] is an 71 y.o. 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  LSN was 11 pm on 11/20/2016, and patient realized he could not move right arm or leg. Patient also had difficulty answering some questions and speech was slow. BP was 341 systolic on arrrival. Glucose was 174.   Date last known well: 9.3.2018 Time last known well: 11 pm NIHSS 7  Modified Rankin:0  Patient was administered IV tPA at [facility] on 11/20/2016.  He was admitted to the neuro ICU for further evaluation and treatment.   SUBJECTIVE (INTERVAL HISTORY) His brother is at the bedside.  According to his brother, at time of stroke, pt's left leg was weak, and he had difficulty answering questions.  CTA with 70% R ICA and 50% L ICA stenosis.  VVS consulted for potential CEA, and are aware that the pt had a recent EF of 20-25%.  MRI pending.   OBJECTIVE Temp:  [97.5 F (36.4 C)-98.1 F (36.7 C)] 98 F (36.7 C) (09/03 1600) Pulse Rate:  [83-110] 107 (09/03 1721) Cardiac Rhythm: Normal sinus rhythm (09/03 1700) Resp:  [10-26] 22 (09/03 1721) BP: (95-120)/(47-74) 113/55 (09/03 1700) SpO2:  [93 %-100 %] 100 % (09/03 1721) Weight:  [79.7 kg (175 lb 11.3 oz)-80.7 kg (178 lb)] 79.7 kg (175 lb 11.3 oz) (09/03 0315)  CBC:   Recent Labs Lab 11/20/16 0120 11/20/16 0127 11/20/16 0704  WBC 10.7*  --  9.5  NEUTROABS 8.5*  --   --   HGB 9.0* 10.2* 8.7*  HCT 29.9* 30.0* 28.7*  MCV 77.5*  --  77.4*  PLT 258  --  937    Basic Metabolic Panel:   Recent Labs Lab 11/15/16 0340 11/20/16 0120 11/20/16 0127  NA 135 136 136  K 4.8 4.9 5.0  CL 103 106 105  CO2 24 20*  --   GLUCOSE 118* 167* 169*  BUN 15 11 14   CREATININE 1.09 1.04 1.00   CALCIUM 8.9 9.3  --     Lipid Panel:     Component Value Date/Time   CHOL 77 11/20/2016 0704   TRIG 64 11/20/2016 0704   HDL 30 (L) 11/20/2016 0704   CHOLHDL 2.6 11/20/2016 0704   VLDL 13 11/20/2016 0704   LDLCALC 34 11/20/2016 0704   HgbA1c:  Lab Results  Component Value Date   HGBA1C 5.9 (H) 11/20/2016   Urine Drug Screen:     Component Value Date/Time   LABOPIA NONE DETECTED 11/20/2016 0657   COCAINSCRNUR NONE DETECTED 11/20/2016 0657   LABBENZ NONE DETECTED 11/20/2016 0657   AMPHETMU NONE DETECTED 11/20/2016 0657   THCU NONE DETECTED 11/20/2016 0657   LABBARB NONE DETECTED 11/20/2016 0657    Alcohol Level     Component Value Date/Time   ETH <5 11/20/2016 0119    IMAGING  Ct Angio Head W Or Wo Contrast Ct Angio Neck W Or Wo Contrast 11/20/2016 IMPRESSION: CTA neck: 1. Severe 70% proximal ICA stenosis secondary to mixed plaque at the carotid bifurcation. 2. Mild less than 50% left proximal ICA stenosis secondary to mixed plaque of the carotid bifurcation. 3. Mild to moderate 50% left vertebral artery origin stenosis secondary to calcified plaque. 4. Multiple pulmonary  nodules measuring up to 14 mm likely representing metastatic disease. CTA head: 1. Left P3 segment occlusion. Otherwise no large vessel occlusion or aneurysm of the circle of Willis. 2. Carotid siphon calcific plaque with short segments of mild right and mild-to-moderate left stenosis.  Ct Head Wo Contrast 11/20/2016 IMPRESSION: 1. Age-indeterminate lacunar infarcts in right corona radiata and right posterior limb of internal capsule. 2. No large acute vascular territory infarct, intracranial hemorrhage, or focal mass effect. 3. ASPECTS is 10  TTE 11/12/2016 Study Conclusions - Left ventricle: The cavity size was normal. There was mild focal   basal hypertrophy of the septum. Systolic function was severely   reduced. The estimated ejection fraction was in the range of 20%   to 25%. Images were  inadequate for LV wall motion assessment. - Mitral valve: There was mild regurgitation. - Left atrium: The atrium was mildly to moderately dilated. - Right ventricle: The cavity size was mildly dilated. Wall   thickness was normal. - Tricuspid valve: There was moderate regurgitation. - Pulmonary arteries: PA peak pressure: 52 mm Hg (S). Impressions: - The right ventricular systolic pressure was increased consistent with moderate pulmonary hypertension. Recommendations:  Limited study with definity contrast to evaluate wall motion and rule out apical thrombus.  L Heart Cath 11/14/2016  Severe diffuse native coronary occlusive disease.  Saphenous vein graft failure.  Total occlusion of the saphenous vein sequential graft to the PDA and PL branch.  Total occlusion of the saphenous vein graft to the ramus intermedius.  Patent LIMA to the LAD mid to distal segment.  Total occlusion of the proximal LAD. 80-90% distal left main.  Ostial 90% circumflex stenosis. 90% ostial stenosis of the first obtuse marginal.  70% ostial and 75% mid ramus intermedius stenosis.  Segmental 90-95% ostial to proximal RCA stenosis. High-grade obstruction, greater than 90% in the mid body of the PDA and 95% obstruction in the PL branch.  Acute on chronic systolic heart failure with left ventricular end-diastolic pressure of 22 mmHg. Echocardiographic ejection fraction 20-25%.  TTE 11/20/2016  pending   PHYSICAL EXAM Pleasant elderly male not in distress. . Afebrile. Head is nontraumatic. Neck is supple without bruit.    Cardiac exam no murmur or gallop. Lungs are clear to auscultation. Distal pulses are well felt. Neurological Exam ;  Awake  Alert oriented x 3. Normal speech and language.eye movements full without nystagmus.fundi were not visualized. Vision acuity and fields appear normal. Hearing is normal. Palatal movements are normal. Face symmetric. Tongue midline. Normal strength, tone, reflexes  and coordination except mild left grip and hop flexor weakness. Diminished fine finger movements on left and orbits right over left upper extremity. Normal sensation. Gait deferred.  ASSESSMENT/PLAN [name] is a 71 y.o. 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 He was administered IV tPA at [facility] on 11/20/2016.   Stroke: L P3 segment occlusion, likely embolic, in setting of severe ischemic cardiomyopathy with HFrEF (20-25%), severe bilateral atherosclerotic carotid disease, and hypercoagulable state secondary to renal carcinoma with lung metastasis.  MRI is pending.   Resultant  No deficits  CT head: No acute stroke.  Age-indeterminate lacunar infarcts in right corona radiata and right posterior limb of internal capsule.  MRI head pending  MRA  head: not ordered  CTA head and neck: L P3 segment occlusion C 70% R proximal ICA stenosis at carotid bifurcation.  Less than 50% L proximal ICA stenosis at carotid bifurcation. 3. Mild to moderate 50% left vertebral artery origin stenosis secondary to calcified plaque.  Carotid siphon calcific plaque with short segments of mild right and mild-to-moderate left stenosis.  Multiple pulmonary nodules, likely metastatic.  2D Echo  pending  LDL 34  HgbA1c 5.9  SCDs for VTE prophylaxis Diet Carb Modified Fluid consistency: Thin; Room service appropriate? Yes  aspirin 81 mg daily prior to admission, now on No antithrombotic  Patient counseled to be compliant with his antithrombotic medications  Ongoing aggressive stroke risk factor management  Therapy recommendations:  pending  Disposition:  pending  Hyperlipidemia  Home meds: 40 mg PO daily, resumed in hospital  LDL 34, goal < 70  Continue statin at  discharge  Diabetes  HgbA1c 5.9, goal < 7.0  Controlled  Other Stroke Risk Factors  Advanced age  Coronary artery disease  Other Active Problems  None  Hospital day # 0  I have personally examined this patient, reviewed notes, independently viewed imaging studies, participated in medical decision making and plan of care.ROS completed by me personally and pertinent positives fully documented  I have made any additions or clarifications directly to the above note. He presented with left hemiparesis and got iv TPA and has made good recovery.He will need emergent right CEA in a few days and ongoing stroke evaluation. Strict BP control and close neurological monitoring as per post TPA protocol.Check MRI This patient is critically ill and at significant risk of neurological worsening, death and care requires constant monitoring of vital signs, hemodynamics,respiratory and cardiac monitoring, extensive review of multiple databases, frequent neurological assessment, discussion with family, other specialists and medical decision making of high complexity.I have made any additions or clarifications directly to the above note.This critical care time does not reflect procedure time, or teaching time or supervisory time of PA/NP/Med Resident etc but could involve care discussion time.  I spent 35 minutes of neurocritical care time  in the care of  this patient.     Antony Contras, MD Medical Director [facility] Pager: [phone] 11/20/2016 6:48 PM   To contact Stroke Continuity provider, please refer to [email or web address] After hours, contact General Neurology

## 2016-11-20 NOTE — Code Documentation (Signed)
Responded to Code Stroke called at [facility].  Patient arrived at [facility] via [facility] with c/o R sided numbness.  [id number].  Per EMS, pt had R sided drift and an abnormal gait.  CBG-178, BP-109/65, HR-94 SR with PVCs.  NIH-6 for R sided drift, ataxia, sensory loss, partial neglect and mild aphasia. CT head-old R lacunar infarcts, negative for hemorrhage. TPA started at [facility].  CTA completed during TPA  infusion.  Plan to admit to ICU by stroke team.

## 2016-11-20 NOTE — H&P (Signed)
Requesting Physician: Dr. Stark Jock    Chief Complaint:  Right side weakness  History obtained from:  Patient and Chart  HPI:                                                                                                                                       Trevor [name] is an 71 y.o. male recently discharged from hospital with daignosis of renal cell carcinoma with lung mets, acute on chronic heart failure with low EF, DM II, HLD, CAD presents as a stroke alert for sudden onset R side weakness.  LSN was 11 pm, and patient realized he could not move right arm or leg. Patient also had difficulty answering some questions and speech was slow. BP was 625 systolic on arrrival. Glucose was 174.   Date last known well: 9.3.2018 Time last known well: 11 pm tPA Given: Yes NIHSS 7  Modified Rankin:0   Past Medical History:  Diagnosis Date  . Anemia   . Coronary artery disease    06/2012: Anterior ST elevation myocardial infarction. Cardiac catheterization showed significant three-vessel coronary artery disease, ejection fraction 35-40%. The patient underwent CABG with LIMA to LAD, SVG to ramus and sequential SVG to right PDA/PL  . Diabetes mellitus without complication ([place])   . Hyperlipidemia   . Ischemic cardiomyopathy    Ejection fraction of 35-40% initially, 45 - 50% on echo 2014, 53% by perfusion study 2016    Past Surgical History:  Procedure Laterality Date  . CORONARY ARTERY BYPASS GRAFT N/A 07/05/2012   Procedure: CORONARY ARTERY BYPASS GRAFTING (CABG);  Surgeon: Ivin Poot, MD;  Location: [facility];  Service: Open Heart Surgery;  Laterality: N/A;  . INTRAOPERATIVE TRANSESOPHAGEAL ECHOCARDIOGRAM N/A 07/05/2012   Procedure: INTRAOPERATIVE TRANSESOPHAGEAL ECHOCARDIOGRAM;  Surgeon: Ivin Poot, MD;  Location: [facility];  Service: Open Heart Surgery;  Laterality: N/A;  . LEFT HEART CATH AND CORS/GRAFTS ANGIOGRAPHY N/A 11/14/2016   Procedure: LEFT HEART CATH AND CORS/GRAFTS ANGIOGRAPHY;   Surgeon: Belva Crome, MD;  Location: [facility];  Service: Cardiovascular;  Laterality: N/A;  . LEFT HEART CATHETERIZATION WITH CORONARY ANGIOGRAM N/A 07/04/2012   Procedure: LEFT HEART CATHETERIZATION WITH CORONARY ANGIOGRAM;  Surgeon: Wellington Hampshire, MD;  Location: [facility];  Service: Cardiovascular;  Laterality: N/A;    Family History  Problem Relation Age of Onset  . Cancer Mother        leukemia  . Cancer Maternal Grandfather        possible cancer  . Heart attack Brother 42   Social History:  reports that he quit smoking about 4 years ago. He has a 50.00 pack-year smoking history. He has never used smokeless tobacco. He reports that he does not drink alcohol or use drugs.  Allergies: No Known Allergies  Medications:  Reviewed and held most medications   ROS:                                                                                                                                         General ROS: negative for - chills, fatigue, fever, night sweats, weight gain or weight loss Psychological ROS: negative for - behavioral disorder, hallucinations, memory difficulties, mood swings or suicidal ideation Ophthalmic ROS: negative for - blurry vision, double vision, eye pain or loss of vision ENT ROS: negative for - epistaxis, nasal discharge, oral lesions, sore throat, tinnitus or vertigo Allergy and Immunology ROS: negative for - hives or itchy/watery eyes Hematological and Lymphatic ROS: negative for - bleeding problems, bruising or swollen lymph nodes Endocrine ROS: negative for - galactorrhea, hair pattern changes, polydipsia/polyuria or temperature intolerance Respiratory ROS: negative for - cough, hemoptysis, shortness of breath or wheezing Cardiovascular ROS: negative for - chest pain, dyspnea on exertion, edema or irregular  heartbeat Gastrointestinal ROS: negative for - abdominal pain, diarrhea, hematemesis, nausea/vomiting or stool incontinence Genito-Urinary ROS: negative for - dysuria, hematuria, incontinence or urinary frequency/urgency Musculoskeletal ROS: negative for - joint swelling or muscular weakness Neurological ROS: as noted in HPI Dermatological ROS: negative for rash and skin lesion changes   Examination:                                                                                                      General: Appears well-developed and well-nourished.  Psych: Affect appropriate to situation Eyes: No scleral injection HENT: No OP obstrucion Head: Normocephalic.  Cardiovascular: Normal rate and regular rhythm.  Respiratory: Effort normal and breath sounds normal to anterior ascultation GI: Soft.  No distension. There is no tenderness.  Skin: WDI   Neurological Examination Mental Status: Alert, oriented, Able to follow  commands without difficulty. However struggled to answer some questions such as facts about recent admission. Appeared to have some naming difficulty. Repetition and comprehension was intact.  Cranial Nerves: II: Discs flat bilaterally; Visual fields grossly normal,  III,IV, VI: ptosis not present, extra-ocular motions intact bilaterally, pupils equal, round, reactive to light and accommodation V,VII: smile symmetric, facial light touch sensation normal bilaterally VIII: hearing normal bilaterally IX,X: uvula rises symmetrically XI: bilateral shoulder shrug XII: midline tongue extension Motor: Right : Upper extremity   5/5    Left:     Upper extremity   3/5  Lower extremity   5/5     Lower extremity  3/5 Tone and bulk:normal tone throughout; no atrophy noted Sensory: reduced sensory symptoms on left face, arm and leg Deep Tendon Reflexes: 2+ and symmetric throughout Plantars: Right: downgoing   Left: downgoing Cerebellar: FN and HS ataxia on left side  Gait:  normal gait and station     Lab Results: Basic Metabolic Panel:  Recent Labs Lab 11/14/16 0307 11/14/16 1832 11/15/16 0340 11/20/16 0120 11/20/16 0127  NA 134*  --  135 136 136  K 4.5  --  4.8 4.9 5.0  CL 103  --  103 106 105  CO2 22  --  24 20*  --   GLUCOSE 143*  --  118* 167* 169*  BUN 18  --  15 11 14   CREATININE 1.17 1.06 1.09 1.04 1.00  CALCIUM 9.0  --  8.9 9.3  --     CBC:  Recent Labs Lab 11/13/16 1515 11/14/16 0307 11/14/16 1832 11/15/16 0340 11/20/16 0120 11/20/16 0127  WBC 11.8* 9.6 9.8 7.5 10.7*  --   NEUTROABS  --   --   --   --  8.5*  --   HGB 9.0* 9.1* 9.0* 8.4* 9.0* 10.2*  HCT 28.7* 28.9* 28.9* 27.7* 29.9* 30.0*  MCV 73.4* 73.5* 74.3* 74.5* 77.5*  --   PLT 320 294 304 280 258  --     Coagulation Studies:  Recent Labs  11/20/16 0120  LABPROT 13.1  INR 1.00    Imaging: Ct Angio Head W Or Wo Contrast  Result Date: 11/20/2016 CLINICAL DATA:  71 y/o  M; code stroke, follow-up angiogram. EXAM: CT ANGIOGRAPHY HEAD AND NECK TECHNIQUE: Multidetector CT imaging of the head and neck was performed using the standard protocol during bolus administration of intravenous contrast. Multiplanar CT image reconstructions and MIPs were obtained to evaluate the vascular anatomy. Carotid stenosis measurements (when applicable) are obtained utilizing NASCET criteria, using the distal internal carotid diameter as the denominator. CONTRAST:  50 cc Isovue 370 COMPARISON:  11/20/2016 CT head.  11/16/2016 MRI of the abdomen. FINDINGS: CTA NECK FINDINGS Aortic arch: Bovine arch branching. Imaged portion shows no evidence of aneurysm or dissection. No significant stenosis of the major arch vessel origins. Mild calcific atherosclerosis. Right carotid system: No dissection or occlusion. Mixed plaque of the right carotid bifurcation with severe 70% proximal ICA stenosis. Left carotid system: No evidence of dissection, stenosis (50% or greater) or occlusion. The moderate mixed  plaque of the carotid bifurcation with proximal ICA mild less than 50% stenosis. Vertebral arteries: Left dominant. No evidence of dissection or occlusion. Calcified plaque of the left vertebral artery origin with 50% mild-to-moderate stenosis. Skeleton: Moderate cervical spondylosis with a predominantly discogenic degenerative changes greatest at the C5 through C7 levels. Uncovertebral and facet hypertrophy at C5-C7 encroach on the neural foramen. Mild bony canal stenosis at C5-6 and C6-7. Other neck: Borderline right lower paratracheal and AP window lymphadenopathy. Upper chest: Multiple pulmonary nodules measuring up to 10 mm in the left upper lobe (series 5, image 6) and up to 14 mm in right upper lobe (series 5, image 10). Smooth septal thickening of the lungs and hazy opacification compatible with mild pulmonary edema. Small bilateral pleural effusions. Review of the MIP images confirms the above findings CTA HEAD FINDINGS Anterior circulation: No large vessel occlusion or aneurysm. Calcified plaque of the carotid siphons with mild right paraclinoid and mild-to-moderate left distal cavernous stenosis. Posterior circulation: No significant stenosis, proximal occlusion, aneurysm, or vascular malformation. Mild non stenotic calcification of the left  V4 segment. Left P3 segment occlusion (series 10, image 22, series 11, image 28, series 8 image 132). Venous sinuses: As permitted by contrast timing, patent. Anatomic variants: Anterior communicating artery and right posterior communicating arteries are patent. No left posterior communicating artery identified, likely hypoplastic or absent. Review of the MIP images confirms the above findings IMPRESSION: CTA neck: 1. Severe 70% proximal ICA stenosis secondary to mixed plaque at the carotid bifurcation. 2. Mild less than 50% left proximal ICA stenosis secondary to mixed plaque of the carotid bifurcation. 3. Mild to moderate 50% left vertebral artery origin stenosis  secondary to calcified plaque. 4. Multiple pulmonary nodules measuring up to 14 mm likely representing metastatic disease. CTA head: 1. Left P3 segment occlusion. Otherwise no large vessel occlusion or aneurysm of the circle of Willis. 2. Carotid siphon calcific plaque with short segments of mild right and mild-to-moderate left stenosis. These results were called by telephone at the time of interpretation on 11/20/2016 at 2:40 am to Dr. Samara Snide , who verbally acknowledged these results. Electronically Signed   By: Kristine Garbe M.D.   On: 11/20/2016 02:42   Ct Head Wo Contrast  Result Date: 11/20/2016 CLINICAL DATA:  71 y/o  M; code stroke EXAM: CT HEAD WITHOUT CONTRAST TECHNIQUE: Contiguous axial images were obtained from the base of the skull through the vertex without intravenous contrast. COMPARISON:  None. FINDINGS: Brain: No large acute stroke, intracranial hemorrhage, or focal mass effect. Mild chronic microvascular ischemic changes of white matter. Small lucencies in right anterior corona radiata and right posterior limb of internal capsule are compatible with age indeterminate chronic lacunar infarctions. Small chronic appearing infarct in left frontal subcortical white matter. Vascular: Moderate calcific atherosclerosis of the siphons. No hyperdense vessel identified Skull: Normal. Negative for fracture or focal lesion. Sinuses/Orbits: Small right-sided maxillary mucous retention cyst. Mild frontal sinus mucosal thickening. Normal aeration of mastoid air cells. Normal orbits Other: None. ASPECTS ([place] Stroke Program Early CT Score) - Ganglionic level infarction (caudate, lentiform nuclei, internal capsule, insula, M1-M3 cortex): 7 - Supraganglionic infarction (M4-M6 cortex): 3 Total score (0-10 with 10 being normal): 10 IMPRESSION: 1. Age-indeterminate lacunar infarcts in right corona radiata and right posterior limb of internal capsule. 2. No large acute vascular territory infarct,  intracranial hemorrhage, or focal mass effect. 3. ASPECTS is 10 These results were called by telephone at the time of interpretation on 11/20/2016 at 1:47 am to Dr. Samara Snide , who verbally acknowledged these results. Electronically Signed   By: Kristine Garbe M.D.   On: 11/20/2016 01:48   Ct Angio Neck W Or Wo Contrast  Result Date: 11/20/2016 CLINICAL DATA:  71 y/o  M; code stroke, follow-up angiogram. EXAM: CT ANGIOGRAPHY HEAD AND NECK TECHNIQUE: Multidetector CT imaging of the head and neck was performed using the standard protocol during bolus administration of intravenous contrast. Multiplanar CT image reconstructions and MIPs were obtained to evaluate the vascular anatomy. Carotid stenosis measurements (when applicable) are obtained utilizing NASCET criteria, using the distal internal carotid diameter as the denominator. CONTRAST:  50 cc Isovue 370 COMPARISON:  11/20/2016 CT head.  11/16/2016 MRI of the abdomen. FINDINGS: CTA NECK FINDINGS Aortic arch: Bovine arch branching. Imaged portion shows no evidence of aneurysm or dissection. No significant stenosis of the major arch vessel origins. Mild calcific atherosclerosis. Right carotid system: No dissection or occlusion. Mixed plaque of the right carotid bifurcation with severe 70% proximal ICA stenosis. Left carotid system: No evidence of dissection, stenosis (50% or greater)  or occlusion. The moderate mixed plaque of the carotid bifurcation with proximal ICA mild less than 50% stenosis. Vertebral arteries: Left dominant. No evidence of dissection or occlusion. Calcified plaque of the left vertebral artery origin with 50% mild-to-moderate stenosis. Skeleton: Moderate cervical spondylosis with a predominantly discogenic degenerative changes greatest at the C5 through C7 levels. Uncovertebral and facet hypertrophy at C5-C7 encroach on the neural foramen. Mild bony canal stenosis at C5-6 and C6-7. Other neck: Borderline right lower paratracheal  and AP window lymphadenopathy. Upper chest: Multiple pulmonary nodules measuring up to 10 mm in the left upper lobe (series 5, image 6) and up to 14 mm in right upper lobe (series 5, image 10). Smooth septal thickening of the lungs and hazy opacification compatible with mild pulmonary edema. Small bilateral pleural effusions. Review of the MIP images confirms the above findings CTA HEAD FINDINGS Anterior circulation: No large vessel occlusion or aneurysm. Calcified plaque of the carotid siphons with mild right paraclinoid and mild-to-moderate left distal cavernous stenosis. Posterior circulation: No significant stenosis, proximal occlusion, aneurysm, or vascular malformation. Mild non stenotic calcification of the left V4 segment. Left P3 segment occlusion (series 10, image 22, series 11, image 28, series 8 image 132). Venous sinuses: As permitted by contrast timing, patent. Anatomic variants: Anterior communicating artery and right posterior communicating arteries are patent. No left posterior communicating artery identified, likely hypoplastic or absent. Review of the MIP images confirms the above findings IMPRESSION: CTA neck: 1. Severe 70% proximal ICA stenosis secondary to mixed plaque at the carotid bifurcation. 2. Mild less than 50% left proximal ICA stenosis secondary to mixed plaque of the carotid bifurcation. 3. Mild to moderate 50% left vertebral artery origin stenosis secondary to calcified plaque. 4. Multiple pulmonary nodules measuring up to 14 mm likely representing metastatic disease. CTA head: 1. Left P3 segment occlusion. Otherwise no large vessel occlusion or aneurysm of the circle of Willis. 2. Carotid siphon calcific plaque with short segments of mild right and mild-to-moderate left stenosis. These results were called by telephone at the time of interpretation on 11/20/2016 at 2:40 am to Dr. Samara Snide , who verbally acknowledged these results. Electronically Signed   By: Kristine Garbe M.D.   On: 11/20/2016 02:42     ASSESSMENT AND PLAN   Acute Ischemic Stroke  Left Hemiparesis  s/p TPA  Not a candidate for MT as patient does not have LVO on Ct angiogram   Risk factors: Malignancy, Low Ejection fraction  Etiology: cardioembolic vs hypercoagulable state  Plan:  # MRI of the brain without contrast #Transthoracic Echo  # Hold all antiplatelets as patient received tPA, can restart ASA after 24hr post PA CT scan normal - however may consider therapeutic Lovenox #Start or continue Atorvastatin 40 mg/other high intensity statin # BP goal: permissive HTN upto 194 systolic, PRNs above 174 systolic  # HBAIC and Lipid profile # Telemetry monitoring # Frequent neuro checks # NPO until passes stroke swallow screen  Systolic heart failure with ejection fraction 35-40% Coronary heart disease  Repeat Echo Cardiology consult in Am Will resume BB. ACEI tomorrow Hold ASA for today  Suspected Renal Cell Carcinoma with Lung mets - awaiting Hem/Onc follow up  Diabetes Mellitus Sliding scale insulin Hold Metformin    Karena Addison Aroor MD [facility] [phone]  If 7pm to 7am, please call on call as listed on AMION.

## 2016-11-20 NOTE — Evaluation (Signed)
Speech Language Pathology Evaluation Patient Details Name: Trevor [name] MRN: [id number] DOB: 1945/07/04 Today's Date: 11/20/2016 Time: 0905-0920 SLP Time Calculation (min) (ACUTE ONLY): 15 min  Problem List:  Patient Active Problem List   Diagnosis Date Noted  . Stroke (cerebrum) ([place]) 11/20/2016  . NSTEMI (non-ST elevated myocardial infarction) ([place])   . Anemia 11/11/2016  . Iron deficiency anemia due to chronic blood loss 09/20/2015  . Recent weight loss 09/20/2015  . Bilateral leg pain 02/21/2015  . Coronary artery disease   . Ischemic cardiomyopathy   . Hyperlipidemia   . ST elevation myocardial infarction (STEMI) of anterior wall, initial episode of care ([place]) 07/04/2012  . Hyperglycemia 07/04/2012   Past Medical History:  Past Medical History:  Diagnosis Date  . Anemia   . Coronary artery disease    06/2012: Anterior ST elevation myocardial infarction. Cardiac catheterization showed significant three-vessel coronary artery disease, ejection fraction 35-40%. The patient underwent CABG with LIMA to LAD, SVG to ramus and sequential SVG to right PDA/PL  . Diabetes mellitus without complication ([place])   . Hyperlipidemia   . Ischemic cardiomyopathy    Ejection fraction of 35-40% initially, 45 - 50% on echo 2014, 53% by perfusion study 2016   Past Surgical History:  Past Surgical History:  Procedure Laterality Date  . CORONARY ARTERY BYPASS GRAFT N/A 07/05/2012   Procedure: CORONARY ARTERY BYPASS GRAFTING (CABG);  Surgeon: Ivin Poot, MD;  Location: [facility];  Service: Open Heart Surgery;  Laterality: N/A;  . INTRAOPERATIVE TRANSESOPHAGEAL ECHOCARDIOGRAM N/A 07/05/2012   Procedure: INTRAOPERATIVE TRANSESOPHAGEAL ECHOCARDIOGRAM;  Surgeon: Ivin Poot, MD;  Location: [facility];  Service: Open Heart Surgery;  Laterality: N/A;  . LEFT HEART CATH AND CORS/GRAFTS ANGIOGRAPHY N/A 11/14/2016   Procedure: LEFT HEART CATH AND CORS/GRAFTS ANGIOGRAPHY;  Surgeon: Belva Crome, MD;  Location:  [facility];  Service: Cardiovascular;  Laterality: N/A;  . LEFT HEART CATHETERIZATION WITH CORONARY ANGIOGRAM N/A 07/04/2012   Procedure: LEFT HEART CATHETERIZATION WITH CORONARY ANGIOGRAM;  Surgeon: Wellington Hampshire, MD;  Location: [facility];  Service: Cardiovascular;  Laterality: N/A;   HPI:  Trevor [name] an 71 y.o.malerecently discharged from hospital with daignosis of renal cell carcinoma with lung mets, acute on chronic heart failure with low EF, DM II, HLD, CAD presents as a stroke alert for sudden onset R side weakness, s/p TPA.    Assessment / Plan / Recommendation Clinical Impression  Cognitive-linguistic skills WFL for all areas assessed. No further SLP needs indicated at this time.     SLP Assessment  SLP Recommendation/Assessment: Patient does not need any further Speech Lanaguage Pathology Services SLP Visit Diagnosis: Dysphagia, unspecified (R13.10)    Follow Up Recommendations  None          SLP Evaluation Cognition  Overall Cognitive Status: Within Functional Limits for tasks assessed Orientation Level: Oriented X4       Comprehension  Auditory Comprehension Overall Auditory Comprehension: Appears within functional limits for tasks assessed Visual Recognition/Discrimination Discrimination: Within Function Limits Reading Comprehension Reading Status: Not tested    Expression Expression Primary Mode of Expression: Verbal Verbal Expression Overall Verbal Expression: Appears within functional limits for tasks assessed Written Expression Dominant Hand: Right   Oral / Motor  Oral Motor/Sensory Function Overall Oral Motor/Sensory Function: Within functional limits Motor Speech Overall Motor Speech: Appears within functional limits for tasks assessed   GO            Gabriel Rainwater MA, CCC-SLP [phone]  Alencia Gordon Meryl 11/20/2016, 9:33 AM

## 2016-11-20 NOTE — Progress Notes (Signed)
PT Cancellation Note  Patient Details Name: Shakur Lembo MRN: [id number] DOB: February 06, 1946   Cancelled Treatment:    Reason Eval/Treat Not Completed: Medical issues which prohibited therapy; patient on strict bedrest.  Will initiate PT when activity orders updated and pt ready for mobility.   Reginia Naas 11/20/2016, 9:10 AM  Magda Kiel, [place] 11/20/2016

## 2016-11-20 NOTE — ED Triage Notes (Signed)
Per EMS, pt LKW at 2300 11/19/16. Pt reports a sudden onset of rt sided numbness. EMS arrival, + rt side arm drift and rt sided weakness in leg. Pt was able to walk, had an abnormal gait. Pt denies pain. Hx of diabetes.

## 2016-11-20 NOTE — ED Notes (Signed)
Dr. Delo notified of elevated trop 

## 2016-11-20 NOTE — ED Notes (Signed)
Blood collected

## 2016-11-21 ENCOUNTER — Inpatient Hospital Stay (HOSPITAL_COMMUNITY): Payer: Medicare Other

## 2016-11-21 ENCOUNTER — Ambulatory Visit: Payer: Medicare Other | Admitting: Cardiovascular Disease

## 2016-11-21 DIAGNOSIS — I340 Nonrheumatic mitral (valve) insufficiency: Secondary | ICD-10-CM

## 2016-11-21 DIAGNOSIS — I361 Nonrheumatic tricuspid (valve) insufficiency: Secondary | ICD-10-CM

## 2016-11-21 LAB — ECHOCARDIOGRAM COMPLETE
Ao-asc: 33 cm
EWDT: 134 ms
FS: 23 % — AB (ref 28–44)
Height: 72 in
IV/PV OW: 1.04
LA ID, A-P, ES: 57 mm
LA diam index: 2.84 cm/m2
LA vol A4C: 66.1 ml
LA vol index: 37.6 mL/m2
LA vol: 75.4 mL
LDCA: 3.46 cm2
LEFT ATRIUM END SYS DIAM: 57 mm
LVOT diameter: 21 mm
MV Dec: 134
MV VTI: 121 cm
MV pk A vel: 55.8 m/s
MVPG: 4 mmHg
MVPKEVEL: 105 m/s
PISA EROA: 0.09 cm2
PW: 13.5 mm — AB (ref 0.6–1.1)
Reg peak vel: 345 cm/s
TAPSE: 13.7 mm
TRMAXVEL: 345 cm/s
Weight: 2788.8 oz

## 2016-11-21 LAB — GLUCOSE, CAPILLARY
GLUCOSE-CAPILLARY: 139 mg/dL — AB (ref 65–99)
GLUCOSE-CAPILLARY: 127 mg/dL — AB (ref 65–99)
Glucose-Capillary: 115 mg/dL — ABNORMAL HIGH (ref 65–99)
Glucose-Capillary: 218 mg/dL — ABNORMAL HIGH (ref 65–99)

## 2016-11-21 MED ORDER — ASPIRIN 325 MG PO TABS
325.0000 mg | ORAL_TABLET | Freq: Once | ORAL | Status: AC
  Administered 2016-11-21: 325 mg via ORAL
  Filled 2016-11-21: qty 1

## 2016-11-21 NOTE — Progress Notes (Signed)
STROKE TEAM PROGRESS NOTE   HISTORY OF PRESENT ILLNESS (per record) Trevor [name] is an 71 y.o. 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  LSN was 11 pm on 11/20/2016, and patient realized he could not move right arm or leg. Patient also had difficulty answering some questions and speech was slow. BP was 836 systolic on arrrival. Glucose was 174.   Date last known well: 9.3.2018 Time last known well: 11 pm NIHSS 7  Modified Rankin:0  Patient was administered IV tPA at [place] on 11/20/2016.  He was admitted to the neuro ICU for further evaluation and treatment.   SUBJECTIVE (INTERVAL HISTORY) No family is at the bedside.  MRI scan of the brain showed a left thalamic as well as white matter infarct but it was a noncontrast study. Patient had recent diagnosis of possible renal cell carcinoma and recent CT angiogram done during this admission raised possibility of pulmonary metastasis. He was referred to oncologist but has not yet been seen. I spoke to Dr. Burr Medico oncologist on call and asked whether he needed inpatient further cancer workup or could be discharged and be seen fairly quickly or continuing cancer workup. Plan to check MRI scan of the brain with contrast to look for intracranial metastasis   OBJECTIVE Temp:  [97.6 F (36.4 C)-98.5 F (36.9 C)] 98.1 F (36.7 C) (09/04 1434) Pulse Rate:  [90-115] 95 (09/04 1434) Cardiac Rhythm: Normal sinus rhythm (09/04 0700) Resp:  [15-28] 17 (09/04 1434) BP: (96-121)/(53-79) 103/53 (09/04 1434) SpO2:  [86 %-100 %] 95 % (09/04 1434) Weight:  [174 lb 4.8 oz (79.1 kg)] 174 lb 4.8 oz (79.1 kg) (09/04 0245)  CBC:   Recent Labs Lab 11/20/16 0120 11/20/16 0127 11/20/16 0704  WBC 10.7*  --  9.5  NEUTROABS 8.5*  --   --   HGB 9.0* 10.2*  8.7*  HCT 29.9* 30.0* 28.7*  MCV 77.5*  --  77.4*  PLT 258  --  629    Basic Metabolic Panel:   Recent Labs Lab 11/15/16 0340 11/20/16 0120 11/20/16 0127  NA 135 136 136  K 4.8 4.9 5.0  CL 103 106 105  CO2 24 20*  --   GLUCOSE 118* 167* 169*  BUN 15 11 14   CREATININE 1.09 1.04 1.00  CALCIUM 8.9 9.3  --     Lipid Panel:     Component Value Date/Time   CHOL 77 11/20/2016 0704   TRIG 64 11/20/2016 0704   HDL 30 (L) 11/20/2016 0704   CHOLHDL 2.6 11/20/2016 0704   VLDL 13 11/20/2016 0704   LDLCALC 34 11/20/2016 0704   HgbA1c:  Lab Results  Component Value Date   HGBA1C 5.9 (H) 11/20/2016   Urine Drug Screen:     Component Value Date/Time   LABOPIA NONE DETECTED 11/20/2016 0657   COCAINSCRNUR NONE DETECTED 11/20/2016 0657   LABBENZ NONE DETECTED 11/20/2016 0657   AMPHETMU NONE DETECTED 11/20/2016 0657   THCU NONE DETECTED 11/20/2016 0657   LABBARB NONE DETECTED 11/20/2016 0657    Alcohol Level     Component Value Date/Time   ETH <5 11/20/2016 0119    IMAGING  Ct Angio Head W Or Wo Contrast Ct Angio Neck W Or Wo Contrast 11/20/2016 IMPRESSION: CTA neck: 1. Severe 70%  proximal ICA stenosis secondary to mixed plaque at the carotid bifurcation. 2. Mild less than 50% left proximal ICA stenosis secondary to mixed plaque of the carotid bifurcation. 3. Mild to moderate 50% left vertebral artery origin stenosis secondary to calcified plaque. 4. Multiple pulmonary nodules measuring up to 14 mm likely representing metastatic disease. CTA head: 1. Left P3 segment occlusion. Otherwise no large vessel occlusion or aneurysm of the circle of Willis. 2. Carotid siphon calcific plaque with short segments of mild right and mild-to-moderate left stenosis.  Ct Head Wo Contrast 11/20/2016 IMPRESSION: 1. Age-indeterminate lacunar infarcts in right corona radiata and right posterior limb of internal capsule. 2. No large acute vascular territory infarct, intracranial hemorrhage, or focal  mass effect. 3. ASPECTS is 10  TTE 11/12/2016 Study Conclusions - Left ventricle: The cavity size was normal. There was mild focal   basal hypertrophy of the septum. Systolic function was severely   reduced. The estimated ejection fraction was in the range of 20%   to 25%. Images were inadequate for LV wall motion assessment. - Mitral valve: There was mild regurgitation. - Left atrium: The atrium was mildly to moderately dilated. - Right ventricle: The cavity size was mildly dilated. Wall   thickness was normal. - Tricuspid valve: There was moderate regurgitation. - Pulmonary arteries: PA peak pressure: 52 mm Hg (S). Impressions: - The right ventricular systolic pressure was increased consistent with moderate pulmonary hypertension. Recommendations:  Limited study with definity contrast to evaluate wall motion and rule out apical thrombus.  L Heart Cath 11/14/2016  Severe diffuse native coronary occlusive disease.  Saphenous vein graft failure.  Total occlusion of the saphenous vein sequential graft to the PDA and PL branch.  Total occlusion of the saphenous vein graft to the ramus intermedius.  Patent LIMA to the LAD mid to distal segment.  Total occlusion of the proximal LAD. 80-90% distal left main.  Ostial 90% circumflex stenosis. 90% ostial stenosis of the first obtuse marginal.  70% ostial and 75% mid ramus intermedius stenosis.  Segmental 90-95% ostial to proximal RCA stenosis. High-grade obstruction, greater than 90% in the mid body of the PDA and 95% obstruction in the PL branch.  Acute on chronic systolic heart failure with left ventricular end-diastolic pressure of 22 mmHg. Echocardiographic ejection fraction 20-25%.  TTE 11/20/2016  Left ventricle: The cavity size was normal. There was mild   concentric hypertrophy. Systolic function was mildly reduced. The   estimated ejection fraction was in the range of 45% to 50%.   Hypokinesis of the anteroseptal  myocardiu   PHYSICAL EXAM Pleasant elderly male not in distress. . Afebrile. Head is nontraumatic. Neck is supple without bruit.    Cardiac exam no murmur or gallop. Lungs are clear to auscultation. Distal pulses are well felt. Neurological Exam ;  Awake  Alert oriented x 3. Normal speech and language.eye movements full without nystagmus.fundi were not visualized. Vision acuity and fields appear normal. Hearing is normal. Palatal movements are normal. Face symmetric. Tongue midline. Normal strength, tone, reflexes and coordination except mild left grip and hop flexor weakness. Diminished fine finger movements on left and orbits right over left upper extremity. Normal sensation. Gait deferred.  ASSESSMENT/PLAN Mr. Demarques Pilz is a 71 y.o. 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 He was administered IV tPA at [place] on 11/20/2016.   Stroke: L P3 segment occlusion, likely embolic, in setting of severe ischemic cardiomyopathy with HFrEF (20-25%) but now improved to 45-50%, severe bilateral atherosclerotic carotid disease, and hypercoagulable state secondary to renal carcinoma with lung metastasis.   MRI Brain wo Subacute acute/early subacute subcentimeter infarctions within left thalamus and left periventricular occipital lobe.  Resultant  No deficits  CT head: No acute stroke.  Age-indeterminate lacunar infarcts in right corona radiata and right posterior limb of internal capsule.  CTA head and neck: L P3 segment occlusion C 70% R proximal ICA stenosis at carotid bifurcation.  Less than 50% L proximal ICA stenosis at carotid bifurcation. 3. Mild to moderate 50% left vertebral artery origin stenosis secondary to calcified plaque.  Carotid siphon calcific plaque with short  segments of mild right and mild-to-moderate left stenosis.  Multiple pulmonary nodules, likely metastatic. 2D Echo  Left ventricle: The cavity size was normal. There was mild   concentric hypertrophy. Systolic function was mildly reduced. The   estimated ejection fraction was in the range of 45% to 50%.    Hypokinesis of the anteroseptal myocardium  LDL 34  HgbA1c 5.9  SCDs for VTE prophylaxis Diet Carb Modified Fluid consistency: Thin; Room service appropriate? Yes  aspirin 81 mg daily prior to admission, now on Aspirin 325 mg  Patient counseled to be compliant with his antithrombotic medications  Ongoing aggressive stroke risk factor management Therapy recommendations:  None Disposition: home Hyperlipidemia  Home meds: 40 mg PO daily, resumed in hospital  LDL 34, goal < 70  Continue statin at discharge  Diabetes  HgbA1c 5.9, goal < 7.0  Controlled  Other Stroke Risk Factors  Advanced age  Coronary artery disease  Other Active Problems  None  Hospital day # 1  I have personally examined this patient, reviewed notes, independently viewed imaging studies, participated in medical decision making and plan of care.ROS completed by me personally and pertinent positives fully documented  I have made any additions or clarifications directly to the above note. He presented with left hemiparesis and got iv TPA and has made good recovery.the etiology of his strokes is indeterminate but could be small vessel disease or related to proximal left vertebral ostial stenosis. He however has a recent diagnosis of possible renal cancer with lung metastasis hence we'll need further oncological evaluation and treatment. I have spoken with the oncologist on call and will check MRI scan of the brain with contrast to look for brain metastasis and likely discharge the patient after that with close oncology follow-up for further evaluation and treatment. I do not believe he is a candidate for  right carotid revascularization at the present time to evaluation and treatment of his cancer is completed. Greater than 50% time during this 25 minute visit was spent on counseling and coordination of care about his strokes, cancer and answering questions     Antony Contras, [place] Pager: [phone] 11/21/2016 4:27 PM   To contact Stroke Continuity provider, please refer to [email or web address] After hours, contact General Neurology

## 2016-11-21 NOTE — Evaluation (Signed)
Occupational Therapy Evaluation Patient Details Name: Trevor [name] MRN: [id number] DOB: November 04, 1945 Today's Date: 11/21/2016    History of Present Illness Trevor [name] is an 71 y.o. male recently discharged from hospital with daignosis of renal cell carcinoma with lung mets, acute on chronic heart failure with low EF, DM II, HLD, CAD presents as a stroke alert for sudden onset R side weakness.    Clinical Impression   Pt currently independent to modified independent for simulated selfcare tasks and functional mobility at this time which is his baseline.  No further OT needs or recommendations.     Follow Up Recommendations  No OT follow up    Equipment Recommendations  None recommended by OT       Precautions / Restrictions Precautions Precautions: None Restrictions Weight Bearing Restrictions: No      Mobility Bed Mobility                  Transfers Overall transfer level: Independent Equipment used: None Transfers: Sit to/from Stand Sit to Stand: Independent              Balance Overall balance assessment: Modified Independent                                         ADL either performed or assessed with clinical judgement   ADL Overall ADL's : At baseline                                       General ADL Comments: Pt currently independent/modified independent for simulated selfcare tasks.  No further OT needs at this time.       Vision Baseline Vision/History: Wears glasses Wears Glasses: At all times Patient Visual Report: No change from baseline Vision Assessment?: Yes Eye Alignment: Within Functional Limits Ocular Range of Motion: Within Functional Limits Alignment/Gaze Preference: Within Defined Limits Saccades: Within functional limits Convergence: Within functional limits Visual Fields: No apparent deficits            Pertinent Vitals/Pain Pain Assessment: No/denies pain     Hand Dominance Right    Extremity/Trunk Assessment Upper Extremity Assessment Upper Extremity Assessment: RUE deficits/detail RUE Deficits / Details: Pt with history of right rotator cuff injury years prior, AROM shoulder flexion 0-100 degrees AAROM 0-150.  All other joints AROM WFLS.  strength at elbow and hand 4+/5   Lower Extremity Assessment Lower Extremity Assessment: Defer to PT evaluation   Cervical / Trunk Assessment Cervical / Trunk Assessment: Normal   Communication Communication Communication: No difficulties   Cognition Arousal/Alertness: Awake/alert Behavior During Therapy: WFL for tasks assessed/performed Overall Cognitive Status: Within Functional Limits for tasks assessed                                                Home Living Family/patient expects to be discharged to:: Private residence Living Arrangements: Other relatives (sister and brother-in-law who has dementia) Available Help at Discharge: Family;Available 24 hours/day (pt assists with brother in law) Type of Home: House Home Access: Stairs to enter [employer] of Steps: 2 Entrance Stairs-Rails: Can reach both Home Layout: One level     Bathroom Shower/Tub: Tub/shower unit  Bathroom Toilet: Handicapped height     Home Equipment: Shower seat          Prior Functioning/Environment Level of Independence: Independent        Comments: assist sister with physical care of his brother in law                                  AM-PAC PT "6 Clicks" Daily Activity     Outcome Measure Help from another person eating meals?: None Help from another person taking care of personal grooming?: None Help from another person toileting, which includes using toliet, bedpan, or urinal?: None Help from another person bathing (including washing, rinsing, drying)?: None Help from another person to put on and taking off regular upper body clothing?: None Help from another person to put on and  taking off regular lower body clothing?: None 6 Click Score: 24   End of Session Nurse Communication: Mobility status  Activity Tolerance: Patient tolerated treatment well Patient left: in chair;with call bell/phone within reach                   Time: 3151-7616 OT Time Calculation (min): 18 min Charges:  OT General Charges $OT Visit: 1 Visit OT Evaluation $OT Eval Low Complexity: 1 Low  Woodrow Dulski OTR/L 11/21/2016, 4:42 PM

## 2016-11-21 NOTE — Evaluation (Signed)
Physical Therapy Evaluation Patient Details Name: Trevor [name] MRN: [id number] DOB: February 06, 1946 Today's Date: 11/21/2016   History of Present Illness  Trevor [name] is an 71 y.o. male recently discharged from hospital with daignosis of renal cell carcinoma with lung mets, acute on chronic heart failure with low EF, DM II, HLD, CAD presents as a stroke alert for sudden onset R side weakness.   Clinical Impression  Pt functioning near baseline. Pt with minimal R sided weakness. Pt with SOB and increased HR into 130s with amb. Pt with good home set up and support. Pt to benefit from cardiac rehab to improve cardiac function during mobility and activity tolerance as pt was indep and physically assisting brother in law.    Follow Up Recommendations No PT follow up;Other (comment) (would benefit from cardiac rehab to improve endurance)    Equipment Recommendations  None recommended by PT    Recommendations for Other Services       Precautions / Restrictions Precautions Precautions: Fall Restrictions Weight Bearing Restrictions: No      Mobility  Bed Mobility Overal bed mobility: Modified Independent             General bed mobility comments: HOB elevated, no use of bed rail, no physical assistance  Transfers Overall transfer level: Needs assistance Equipment used: None Transfers: Sit to/from Stand Sit to Stand: Min guard         General transfer comment: min guard for safety due to first time up  Ambulation/Gait Ambulation/Gait assistance: Min guard Ambulation Distance (Feet): 300 Feet Assistive device: None Gait Pattern/deviations: Step-through pattern Gait velocity: wfl Gait velocity interpretation: at or above normal speed for age/gender General Gait Details: no episode of LOB, pt bow legged. pt with SOB, SPO2 >89% on RA, HR 138 at end of walker  Stairs Stairs: Yes Stairs assistance: Min guard Stair Management: One rail Right Number of Stairs: 12 General stair  comments: pt able to complete alternating ascending and descending without difficulty  Wheelchair Mobility    Modified Rankin (Stroke Patients Only) Modified Rankin (Stroke Patients Only) Pre-Morbid Rankin Score: No symptoms Modified Rankin: No significant disability     Balance                                 Standardized Balance Assessment Standardized Balance Assessment : Dynamic Gait Index   Dynamic Gait Index Level Surface: Normal Change in Gait Speed: Normal Gait with Horizontal Head Turns: Normal Gait with Vertical Head Turns: Normal Gait and Pivot Turn: Normal Step Over Obstacle: Mild Impairment Step Around Obstacles: Normal Steps: Mild Impairment Total Score: 22       Pertinent Vitals/Pain Pain Assessment: No/denies pain    Home Living Family/patient expects to be discharged to:: Private residence Living Arrangements: Other relatives (sister and brother-in-law who has dementia) Available Help at Discharge: Family;Available 24 hours/day (pt assists with brother in law) Type of Home: House Home Access: Stairs to enter Entrance Stairs-Rails: Can reach both Entrance Stairs-Number of Steps: 2 Home Layout: One level Home Equipment: Shower seat      Prior Function Level of Independence: Independent         Comments: assist sister with physical care of his brother in law     Hand Dominance   Dominant Hand: Right    Extremity/Trunk Assessment   Upper Extremity Assessment Upper Extremity Assessment: Overall WFL for tasks assessed    Lower Extremity Assessment Lower Extremity  Assessment: Overall WFL for tasks assessed    Cervical / Trunk Assessment Cervical / Trunk Assessment: Normal  Communication   Communication: No difficulties  Cognition Arousal/Alertness: Awake/alert Behavior During Therapy: WFL for tasks assessed/performed Overall Cognitive Status: Within Functional Limits for tasks assessed                                  General Comments: pt with tangential speech      General Comments      Exercises     Assessment/Plan    PT Assessment Patient needs continued PT services  PT Problem List Decreased strength;Decreased activity tolerance;Decreased mobility       PT Treatment Interventions DME instruction;Gait training;Stair training;Functional mobility training;Therapeutic activities;Therapeutic exercise;Balance training;Neuromuscular re-education    PT Goals (Current goals can be found in the Care Plan section)  Acute Rehab PT Goals Patient Stated Goal: home and walking 3/4 mile a day PT Goal Formulation: With patient Time For Goal Achievement: 11/28/16 Potential to Achieve Goals: Good Additional Goals Additional Goal #1: Pt to score >41 on Berg Balance test to indicate minimal falls risk.    Frequency Min 2X/week   Barriers to discharge        Co-evaluation               AM-PAC PT "6 Clicks" Daily Activity  Outcome Measure Difficulty turning over in bed (including adjusting bedclothes, sheets and blankets)?: None Difficulty moving from lying on back to sitting on the side of the bed? : None Difficulty sitting down on and standing up from a chair with arms (e.g., wheelchair, bedside commode, etc,.)?: None Help needed moving to and from a bed to chair (including a wheelchair)?: None Help needed walking in hospital room?: A Little Help needed climbing 3-5 steps with a railing? : A Little 6 Click Score: 22    End of Session Equipment Utilized During Treatment: Gait belt Activity Tolerance: Patient tolerated treatment well Patient left: in chair;with call bell/phone within reach Nurse Communication: Mobility status PT Visit Diagnosis: Unsteadiness on feet (R26.81)    Time: 1610-9604 PT Time Calculation (min) (ACUTE ONLY): 30 min   Charges:   PT Evaluation $PT Eval Low Complexity: 1 Low PT Treatments $Gait Training: 8-22 mins   PT G Codes:        Kittie Plater, PT, DPT Pager #: [phone] Office #: [phone]   [place] 11/21/2016, 12:27 PM

## 2016-11-21 NOTE — Consult Note (Signed)
Opened in error

## 2016-11-21 NOTE — Plan of Care (Signed)
Problem: Education: Goal: Knowledge of disease or condition will improve Outcome: Progressing Pt will be more knowledgeable about stroke prior to discharge.    Problem: Safety: Goal: Ability to remain free from injury will improve Outcome: Progressing Pt will be free from falls and injuries during this hospitalization.

## 2016-11-21 NOTE — Progress Notes (Signed)
Pt transferred to 5w11 from 4N. Pt is A&Ox4. Pt lives at home with sister and brother in [place]. Pt's skin is warm, dry and intact. Pt oriented to room. Told to call for assistance, pt stated understanding. Pt placed on telemetry box #13. Will continue to monitor pt. Ranelle Oyster, RN

## 2016-11-21 NOTE — Progress Notes (Signed)
I received referral from Dr. Leonie Man.   Chart reviewed. Pt was admitted on 11/11/2016 for NSTEMI, scan found left renal mass and lung metastasis. He was supposed to see my partner Dr. Julien Nordmann soon, but was readmitted for stroke yesterday. He is doing well from neurological standpoint, and may go home soon.  I have discussed with my partner Dr. Julien Nordmann, he will arrange outpatient consult within the next week.  I stopped by and saw pt briefly, and answered his questions.   Truitt Merle  11/21/2016

## 2016-11-21 NOTE — Progress Notes (Signed)
  Echocardiogram 2D Echocardiogram has been performed.  Trevor [name] 11/21/2016, 11:17 AM

## 2016-11-22 ENCOUNTER — Telehealth: Payer: Self-pay | Admitting: Internal Medicine

## 2016-11-22 DIAGNOSIS — I639 Cerebral infarction, unspecified: Secondary | ICD-10-CM

## 2016-11-22 LAB — BASIC METABOLIC PANEL
Anion gap: 8 (ref 5–15)
BUN: 10 mg/dL (ref 6–20)
CHLORIDE: 105 mmol/L (ref 101–111)
CO2: 24 mmol/L (ref 22–32)
Calcium: 9.2 mg/dL (ref 8.9–10.3)
Creatinine, Ser: 1.12 mg/dL (ref 0.61–1.24)
GFR calc Af Amer: >60 mL/min (ref >60)
GFR calc non Af Amer: >60 mL/min (ref >60)
Glucose, Bld: 120 mg/dL — ABNORMAL HIGH (ref 65–99)
POTASSIUM: 4.5 mmol/L (ref 3.5–5.1)
Sodium: 137 mmol/L (ref 135–145)

## 2016-11-22 LAB — MAGNESIUM: MAGNESIUM: 1.7 mg/dL (ref 1.7–2.4)

## 2016-11-22 LAB — GLUCOSE, CAPILLARY
GLUCOSE-CAPILLARY: 147 mg/dL — AB (ref 65–99)
Glucose-Capillary: 106 mg/dL — ABNORMAL HIGH (ref 65–99)

## 2016-11-22 MED ORDER — PANTOPRAZOLE SODIUM 40 MG PO TBEC: 40.0000 mg | DELAYED_RELEASE_TABLET | Freq: Every day | ORAL | 0 refills | Status: DC

## 2016-11-22 MED ORDER — ASPIRIN 81 MG PO TBEC: 325.0000 mg | DELAYED_RELEASE_TABLET | Freq: Every day | ORAL | 12 refills | Status: DC

## 2016-11-22 MED ORDER — LISINOPRIL 2.5 MG PO TABS
5.0000 mg | ORAL_TABLET | Freq: Every day | ORAL | 0 refills | Status: DC
Start: 2016-11-22 — End: 2017-01-30

## 2016-11-22 NOTE — Progress Notes (Addendum)
Notified Dr. Leonel Ramsay that telemetry monitoring called and stated that pt is running bigeminy and trigeminy. Pt asymptomatic. Dr. Leonel Ramsay states he will look into it. MD ordered EKG and BMET/Magnesium. Will continue to monitor pt. Ranelle Oyster, RN

## 2016-11-22 NOTE — Telephone Encounter (Signed)
sw pt to inform of MD appt 9/19 at 11 am per sch msg

## 2016-11-22 NOTE — Discharge Summary (Signed)
Stroke Discharge Summary  Patient ID: Trevor [name]   MRN: [id number]      DOB: 05-21-1945  Date of Admission: 11/20/2016 Date of Discharge: 11/22/2016  Attending Physician:  Garvin Fila, MD, Stroke MD Consultant(s):    oncology  Patient's PCP:  ViaLennette Bihari, MD  DISCHARGE DIAGNOSIS: Principal Problem:   Stroke (cerebrum) ([place]) - L P3 segment occlusion, likely embolic, in setting of severe ischemic cardiomyopathy with HFrEF (20-25%) but now improved to 45-50%, Asymptomatic  bilateral atherosclerotic carotid stenosis right greater than left  Recent diagnosis of renal cancer with suspected pulmonary metastasis-needs oncology outpatient consult and work up and treatment plan   Past Medical History:  Diagnosis Date  . Anemia   . Coronary artery disease    06/2012: Anterior ST elevation myocardial infarction. Cardiac catheterization showed significant three-vessel coronary artery disease, ejection fraction 35-40%. The patient underwent CABG with LIMA to LAD, SVG to ramus and sequential SVG to right PDA/PL  . Diabetes mellitus without complication ([place])   . Hyperlipidemia   . Ischemic cardiomyopathy    Ejection fraction of 35-40% initially, 45 - 50% on echo 2014, 53% by perfusion study 2016   Past Surgical History:  Procedure Laterality Date  . CORONARY ARTERY BYPASS GRAFT N/A 07/05/2012   Procedure: CORONARY ARTERY BYPASS GRAFTING (CABG);  Surgeon: Ivin Poot, MD;  Location: [facility];  Service: Open Heart Surgery;  Laterality: N/A;  . INTRAOPERATIVE TRANSESOPHAGEAL ECHOCARDIOGRAM N/A 07/05/2012   Procedure: INTRAOPERATIVE TRANSESOPHAGEAL ECHOCARDIOGRAM;  Surgeon: Ivin Poot, MD;  Location: [facility];  Service: Open Heart Surgery;  Laterality: N/A;  . LEFT HEART CATH AND CORS/GRAFTS ANGIOGRAPHY N/A 11/14/2016   Procedure: LEFT HEART CATH AND CORS/GRAFTS ANGIOGRAPHY;  Surgeon: Belva Crome, MD;  Location: [facility];  Service: Cardiovascular;  Laterality: N/A;  . LEFT HEART  CATHETERIZATION WITH CORONARY ANGIOGRAM N/A 07/04/2012   Procedure: LEFT HEART CATHETERIZATION WITH CORONARY ANGIOGRAM;  Surgeon: Wellington Hampshire, MD;  Location: [facility];  Service: Cardiovascular;  Laterality: N/A;    Allergies as of 11/22/2016   No Known Allergies     Medication List    TAKE these medications   aspirin 81 MG EC tablet Take 4 tablets (325 mg total) by mouth daily. What changed:  how much to take   atorvastatin 40 MG tablet Commonly known as:  LIPITOR Take 1 tablet (40 mg total) by mouth daily.   carvedilol 3.125 MG tablet Commonly known as:  COREG Take 1 tablet (3.125 mg total) by mouth 2 (two) times daily.   ferrous sulfate 325 (65 FE) MG tablet Take 325 mg by mouth daily with breakfast.   lisinopril 2.5 MG tablet Commonly known as:  PRINIVIL,ZESTRIL Take 2 tablets (5 mg total) by mouth daily.   metFORMIN 1000 MG tablet Commonly known as:  GLUCOPHAGE Take 1,000 mg by mouth 2 (two) times daily.   pantoprazole 40 MG tablet Commonly known as:  PROTONIX Take 1 tablet (40 mg total) by mouth at bedtime.            Discharge Care Instructions        Start     Ordered   11/22/16 0000  aspirin 81 MG EC tablet  Daily     11/22/16 1555   11/22/16 0000  lisinopril (PRINIVIL,ZESTRIL) 2.5 MG tablet  Daily     11/22/16 1555   11/22/16 0000  pantoprazole (PROTONIX) 40 MG tablet  Daily at bedtime  11/22/16 1555   11/22/16 0000  Ambulatory referral to Neurology    Comments:  An appointment is requested in approximately: 6 - 8 weeks   11/22/16 1555      LABORATORY STUDIES CBC    Component Value Date/Time   WBC 9.5 11/20/2016 0704   RBC 3.71 (L) 11/20/2016 0704   HGB 8.7 (L) 11/20/2016 0704   HGB 9.9 (L) 11/02/2015 1133   HCT 28.7 (L) 11/20/2016 0704   HCT 32.3 (L) 11/02/2015 1133   PLT 257 11/20/2016 0704   PLT 278 11/02/2015 1133   MCV 77.4 (L) 11/20/2016 0704   MCV 83.7 11/02/2015 1133   MCH 23.5 (L) 11/20/2016 0704   MCHC 30.3  11/20/2016 0704   RDW 23.6 (H) 11/20/2016 0704   RDW 21.4 (H) 11/02/2015 1133   LYMPHSABS 1.0 11/20/2016 0120   LYMPHSABS 1.3 11/02/2015 1133   MONOABS 0.8 11/20/2016 0120   MONOABS 0.7 11/02/2015 1133   EOSABS 0.4 11/20/2016 0120   EOSABS 0.3 11/02/2015 1133   BASOSABS 0.0 11/20/2016 0120   BASOSABS 0.0 11/02/2015 1133   CMP    Component Value Date/Time   NA 137 11/22/2016 0212   K 4.5 11/22/2016 0212   CL 105 11/22/2016 0212   CO2 24 11/22/2016 0212   GLUCOSE 120 (H) 11/22/2016 0212   BUN 10 11/22/2016 0212   CREATININE 1.12 11/22/2016 0212   CALCIUM 9.2 11/22/2016 0212   PROT 6.4 (L) 11/20/2016 0120   ALBUMIN 3.3 (L) 11/20/2016 0120   AST 21 11/20/2016 0120   ALT 18 11/20/2016 0120   ALKPHOS 94 11/20/2016 0120   BILITOT 0.5 11/20/2016 0120   GFRNONAA >60 11/22/2016 0212   GFRAA >60 11/22/2016 0212   COAGS Lab Results  Component Value Date   INR 1.00 11/20/2016   INR 1.08 11/14/2016   INR 1.13 11/12/2016   Lipid Panel    Component Value Date/Time   CHOL 77 11/20/2016 0704   TRIG 64 11/20/2016 0704   HDL 30 (L) 11/20/2016 0704   CHOLHDL 2.6 11/20/2016 0704   VLDL 13 11/20/2016 0704   LDLCALC 34 11/20/2016 0704   HgbA1C  Lab Results  Component Value Date   HGBA1C 5.9 (H) 11/20/2016   Urinalysis    Component Value Date/Time   COLORURINE YELLOW 11/20/2016 0657   APPEARANCEUR CLEAR 11/20/2016 0657   LABSPEC 1.034 (H) 11/20/2016 0657   PHURINE 5.0 11/20/2016 0657   GLUCOSEU NEGATIVE 11/20/2016 0657   HGBUR NEGATIVE 11/20/2016 0657   BILIRUBINUR NEGATIVE 11/20/2016 0657   KETONESUR NEGATIVE 11/20/2016 0657   PROTEINUR NEGATIVE 11/20/2016 0657   UROBILINOGEN 0.2 07/04/2012 1852   NITRITE NEGATIVE 11/20/2016 0657   LEUKOCYTESUR NEGATIVE 11/20/2016 0657   Urine Drug Screen     Component Value Date/Time   LABOPIA NONE DETECTED 11/20/2016 0657   COCAINSCRNUR NONE DETECTED 11/20/2016 0657   LABBENZ NONE DETECTED 11/20/2016 0657   AMPHETMU NONE DETECTED  11/20/2016 0657   THCU NONE DETECTED 11/20/2016 0657   LABBARB NONE DETECTED 11/20/2016 0657    Alcohol Level    Component Value Date/Time   ETH <5 11/20/2016 0119     SIGNIFICANT DIAGNOSTIC STUDIES Ct Angio Head W Or Wo Contrast Ct Angio Neck W Or Wo Contrast 11/20/2016 IMPRESSION: CTA neck: 1. Severe 70% proximal ICA stenosis secondary to mixed plaque at the carotid bifurcation. 2. Mild less than 50% left proximal ICA stenosis secondary to mixed plaque of the carotid bifurcation. 3. Mild to moderate 50% left vertebral artery origin  stenosis secondary to calcified plaque. 4. Multiple pulmonary nodules measuring up to 14 mm likely representing metastatic disease. CTA head: 1. Left P3 segment occlusion. Otherwise no large vessel occlusion or aneurysm of the circle of Willis. 2. Carotid siphon calcific plaque with short segments of mild right and mild-to-moderate left stenosis.  Ct Head Wo Contrast 11/20/2016 IMPRESSION: 1. Age-indeterminate lacunar infarcts in right corona radiata and right posterior limb of internal capsule. 2. No large acute vascular territory infarct, intracranial hemorrhage, or focal mass effect. 3. ASPECTS is 10  TTE 11/12/2016 Study Conclusions - Left ventricle: The cavity size was normal. There was mild focal basal hypertrophy of the septum. Systolic function was severely reduced. The estimated ejection fraction was in the range of 20% to 25%. Images were inadequate for LV wall motion assessment. - Mitral valve: There was mild regurgitation. - Left atrium: The atrium was mildly to moderately dilated. - Right ventricle: The cavity size was mildly dilated. Wall thickness was normal. - Tricuspid valve: There was moderate regurgitation. - Pulmonary arteries: PA peak pressure: 52 mm Hg (S). Impressions: - The right ventricular systolic pressure was increased consistent with moderate pulmonary hypertension. Recommendations: Limited study with definity  contrast to evaluate wall motion and rule out apical thrombus.  L Heart Cath 11/14/2016  Severe diffuse native coronary occlusive disease.  Saphenous vein graft failure.  Total occlusion of the saphenous vein sequential graft to the PDA and PL branch.  Total occlusion of the saphenous vein graft to the ramus intermedius.  Patent LIMA to the LAD mid to distal segment.  Total occlusion of the proximal LAD. 80-90% distal left main.  Ostial 90% circumflex stenosis. 90% ostial stenosis of the first obtuse marginal.  70% ostial and 75% mid ramus intermedius stenosis.  Segmental 90-95% ostial to proximal RCA stenosis. High-grade obstruction, greater than 90% in the mid body of the PDA and 95% obstruction in the PL branch.  Acute on chronic systolic heart failure with left ventricular end-diastolic pressure of 22 mmHg. Echocardiographic ejection fraction 20-25%.  TTE 11/20/2016  Left ventricle: The cavity size was normal. There was mild concentric hypertrophy. Systolic function was mildly reduced. The estimated ejection fraction was in the range of 45% to 50%. 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  LSN was 11 pm on 11/20/2016, and patient realized he could not move right arm or leg. Patient also had difficulty answering some questions and speech was slow. BP was 102 systolic on arrrival. Glucose was 174.   Date last known well: 9.3.2018 Time last known well: 11 pm NIHSS 7  Modified Rankin:0  HOSPITAL COURSE [name] is a 71 y.o. 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 He was administered IV tPA at [facility] on 11/20/2016.   Stroke: L P3 segment occlusion, likely embolic, in setting of severe ischemic cardiomyopathy with HFrEF (20-25%) but now improved to 45-50%, severe bilateral atherosclerotic carotid disease, and hypercoagulable state secondary  to renal carcinoma with lung metastasis.   MRI Brain wo Subacute acute/early subacute subcentimeter infarctions within left thalamus and left periventricular occipital lobe. MRI scan of the brain with contrast ordered but patient refused as he was claustrophobic  Resultant  No deficits  CT head: No acute stroke.  Age-indeterminate lacunar infarcts in right corona radiata and right posterior limb of internal capsule.  CTA head and neck: L P3 segment occlusion C 70% R proximal ICA stenosis at carotid bifurcation.  Less than 50% L proximal ICA stenosis at carotid bifurcation. 3. Mild to moderate 50% left vertebral artery origin stenosis secondary to calcified plaque.  Carotid siphon calcific plaque with short segments of mild right and mild-to-moderate left stenosis.  Multiple pulmonary nodules, likely metastatic.  2D Echo  Left ventricle: The cavity size was normal. There was mild concentric hypertrophy. Systolic function was mildly reduced. The estimated ejection fraction was in the range of 45% to 50%.  Hypokinesis of the anteroseptal myocardium  LDL 34  HgbA1c 5.9  SCDs for VTE prophylaxis  Diet Carb Modified Fluid consistency: Thin; Room service appropriate? Yes  aspirin 81 mg daily prior to admission, now on Aspirin 325 mg  Patient counseled to be compliant with his antithrombotic medications  Ongoing aggressive stroke risk factor management Therapy recommendations:  None Disposition: home Hyperlipidemia  Home meds: 40 mg PO daily, resumed in  hospital  LDL 34, goal < 70  Continue statin at discharge  Diabetes  HgbA1c 5.9, goal < 7.0  Controlled  Other Stroke Risk Factors  Advanced age  Coronary artery disease  Other Active Problems  None  DISCHARGE EXAM Blood pressure 114/69, pulse (!) 101, temperature 98.3 F (36.8 C), temperature source Oral, resp. rate (!) 24, height 6' (1.829 m), weight 79.1 kg (174 lb 4.8 oz), SpO2 100 %. Pleasant elderly male not in distress. . Afebrile. Head is nontraumatic. Neck is supple without bruit.    Cardiac exam no murmur or gallop. Lungs are clear to auscultation. Distal pulses are well felt. Neurological Exam ;  Awake  Alert oriented x 3. Normal speech and language.eye movements full without nystagmus.fundi were not visualized. Vision acuity and fields appear normal. Hearing is normal. Palatal movements are normal. Face symmetric. Tongue midline. Normal strength, tone, reflexes and coordination except mild left grip and hop flexor weakness. Diminished fine finger movements on left and orbits right over left upper extremity. Normal sensation. Gait deferred.  Discharge Diet   Diet Carb Modified Fluid consistency: Thin; Room service appropriate? Yes liquids  DISCHARGE PLAN  Disposition: Discharge to home/self care  aspirin 325 mg daily for secondary stroke prevention.  Ongoing risk factor control by Primary Care Physician at time of discharge  Follow-up Via, [name], MD in 2 weeks.  Follow-up with Antony Contras, Stroke Clinic in 6 weeks, office to schedule an appointment.  Follow up with Dr. Inda Merlin, oncologist in 1 - 2 weeks, office to schedule an appointment for new renal cancer work up and treatment.Case discussed with Dr Burr Medico oncologist on call who reviewed patient chart and agreed to expedite outpatient oncology follow up plan  Right carotid revascularization deferred till cancer evaluation and treatment plan is in place first  45 minutes were spent preparing  discharge.  I have personally examined this patient, reviewed notes, independently viewed imaging studies, participated in medical decision making and plan of care.ROS completed by me personally and pertinent positives fully documented  I have made any additions or clarifications directly to the above note. Agree with  note above.    Antony Contras, MD Medical Director [facility] Pager: [phone] 11/22/2016 6:16 PM

## 2016-11-22 NOTE — Progress Notes (Signed)
Persistent frequent PVCs in an asymptomatic patient, will get BMP, magnesium, EKG. Could consider cardiology consult in the AM.   Roland Rack, MD [facility] [phone]  If 7pm- 7am, please page neurology on call as listed in [place].

## 2016-11-22 NOTE — Progress Notes (Signed)
Discharge instructions (including medications) discussed with and copy provided to patient/caregiver 

## 2016-11-22 NOTE — Progress Notes (Signed)
Oneal Deputy to be D/C'd Home per MD order.  Discussed with the patient and all questions fully answered.  VSS, Skin clean, dry and intact without evidence of skin break down, no evidence of skin tears noted. IV catheter discontinued intact. Site without signs and symptoms of complications. Dressing and pressure applied.  An After Visit Summary was printed and given to the patient. Patient received prescription.  D/c education completed with patient/family including follow up instructions, medication list, d/c activities limitations if indicated, with other d/c instructions as indicated by MD - patient able to verbalize understanding, all questions fully answered.   Patient instructed to return to ED, call 911, or call MD for any changes in condition.   Patient escorted via [place], and D/C home via private auto.  Luci Bank 11/22/2016 4:23 PM

## 2016-11-23 ENCOUNTER — Inpatient Hospital Stay (HOSPITAL_COMMUNITY): Admission: RE | Admit: 2016-11-23 | Payer: Medicare Other | Source: Ambulatory Visit

## 2016-11-30 ENCOUNTER — Telehealth: Payer: Self-pay | Admitting: Cardiovascular Disease

## 2016-11-30 NOTE — Telephone Encounter (Signed)
Tried to call pt no vm set up  

## 2016-11-30 NOTE — Telephone Encounter (Signed)
Received call from patient insurance provider stating he is due for a tcm [place]  appt with Dr. Fletcher Anon s/p Cath    Attempted to contact patient .  No ans no vm .

## 2016-12-01 NOTE — Telephone Encounter (Signed)
Spoke to patient appointment schedule for 12/26/16 at 8 am

## 2016-12-04 ENCOUNTER — Other Ambulatory Visit: Payer: Self-pay | Admitting: Cardiovascular Disease

## 2016-12-04 DIAGNOSIS — I6521 Occlusion and stenosis of right carotid artery: Secondary | ICD-10-CM

## 2016-12-06 ENCOUNTER — Telehealth: Payer: Self-pay | Admitting: Emergency Medicine

## 2016-12-06 ENCOUNTER — Ambulatory Visit (HOSPITAL_BASED_OUTPATIENT_CLINIC_OR_DEPARTMENT_OTHER): Payer: Medicare Other | Admitting: Oncology

## 2016-12-06 ENCOUNTER — Telehealth: Payer: Self-pay | Admitting: Oncology

## 2016-12-06 VITALS — BP 102/54 | HR 91 | Temp 98.6°F | Resp 18 | Wt 167.6 lb

## 2016-12-06 DIAGNOSIS — N2889 Other specified disorders of kidney and ureter: Secondary | ICD-10-CM | POA: Diagnosis not present

## 2016-12-06 DIAGNOSIS — D500 Iron deficiency anemia secondary to blood loss (chronic): Secondary | ICD-10-CM | POA: Diagnosis not present

## 2016-12-06 DIAGNOSIS — I2510 Atherosclerotic heart disease of native coronary artery without angina pectoris: Secondary | ICD-10-CM

## 2016-12-06 DIAGNOSIS — E279 Disorder of adrenal gland, unspecified: Secondary | ICD-10-CM

## 2016-12-06 DIAGNOSIS — R911 Solitary pulmonary nodule: Secondary | ICD-10-CM | POA: Diagnosis not present

## 2016-12-06 DIAGNOSIS — I639 Cerebral infarction, unspecified: Secondary | ICD-10-CM | POA: Diagnosis not present

## 2016-12-06 NOTE — Assessment & Plan Note (Signed)
Patient has a history of persistent anemia and has received IV iron infusions in the past. We will recheck a CBC and iron studies at his next visit with [place] in 2 weeks.

## 2016-12-06 NOTE — Telephone Encounter (Signed)
Gave avs and calendar for October  °

## 2016-12-06 NOTE — Progress Notes (Signed)
[facility]:    [address], [place] [address]   DIAGNOSIS: left kidney mass suspicious for renal cell carcinoma and bilateral pulmonary nodules  SUMMARY OF ONCOLOGIC HISTORY:  No history exists.    CURRENT THERAPY: none  INTERVAL HISTORY: Trevor [name] 71 y.o. male returns for routine follow-up for his left kidney mass and pulmonary nodules. The patient has been hospitalized twice over the past month. The first hospitalization was for NSTEMI and the second was for an acute CVA. The patient is feeling better since he has been out of the hospital. He reports ongoing right head and arm numbness. He feels that his fatigue has improved. Denies fevers and chills. Denies chest pain, shortness of breath, cough, hemoptysis. Denies abdominal pain, nausea, vomiting, constipation diarrhea. Denies hematuria. The patient reports that he is due to follow-up with cardiology in the near future and may require additional stents. He is due to follow-up with neurology in a few weeks. The patient is here today for evaluation and discussion of workup for his left kidney mass and pulmonary nodules.   Patient Active Problem List   Diagnosis Date Noted  . Renal mass 12/06/2016  . 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 NSTEMI (non-ST elevated myocardial infarction) ([facility])   . Anemia 11/11/2016  . Iron deficiency anemia due to chronic blood loss 09/20/2015  . Recent weight loss 09/20/2015  . Bilateral leg pain 02/21/2015  . Coronary artery disease   . Ischemic cardiomyopathy   . Hyperlipidemia   . ST elevation myocardial infarction (STEMI) of anterior wall, initial episode of care ([facility]) 07/04/2012  . Hyperglycemia 07/04/2012    has No Known Allergies.  MEDICAL HISTORY: Past  Medical History:  Diagnosis Date  . Anemia   . Coronary artery disease    06/2012: Anterior ST elevation myocardial infarction. Cardiac catheterization showed significant three-vessel coronary artery disease, ejection fraction 35-40%. The patient underwent CABG with LIMA to LAD, SVG to ramus and sequential SVG to right PDA/PL  . Diabetes mellitus without complication ([facility])   . Hyperlipidemia   . Ischemic cardiomyopathy    Ejection fraction of 35-40% initially, 45 - 50% on echo 2014, 53% by perfusion study 2016    SURGICAL HISTORY: Past Surgical History:  Procedure Laterality Date  . CORONARY ARTERY BYPASS GRAFT N/A 07/05/2012   Procedure: CORONARY ARTERY BYPASS GRAFTING (CABG);  Surgeon: Ivin Poot, MD;  Location: [facility];  Service: Open Heart Surgery;  Laterality: N/A;  . INTRAOPERATIVE TRANSESOPHAGEAL ECHOCARDIOGRAM N/A 07/05/2012   Procedure: INTRAOPERATIVE TRANSESOPHAGEAL ECHOCARDIOGRAM;  Surgeon: Ivin Poot, MD;  Location: [facility];  Service: Open Heart Surgery;  Laterality: N/A;  . LEFT HEART CATH AND CORS/GRAFTS ANGIOGRAPHY N/A 11/14/2016   Procedure: LEFT HEART CATH AND CORS/GRAFTS ANGIOGRAPHY;  Surgeon: Belva Crome, MD;  Location: [facility];  Service: Cardiovascular;  Laterality: N/A;  . LEFT HEART CATHETERIZATION WITH CORONARY ANGIOGRAM N/A 07/04/2012   Procedure: LEFT HEART CATHETERIZATION WITH CORONARY ANGIOGRAM;  Surgeon: Wellington Hampshire, MD;  Location: [facility];  Service: Cardiovascular;  Laterality: N/A;    SOCIAL HISTORY: Social History   Social History  . Marital status: Widowed    Spouse name: N/A  . Number of children: N/A  . Years of education: N/A   Occupational History  .  Retired Psychologist, educational    Social History Main Topics  . Smoking status: Former Smoker    Packs/day: 1.00    Years: 50.00    Quit date: 04/20/2012  . Smokeless tobacco: Never Used  . Alcohol use No  . Drug use: No  . Sexual activity: Not on file     Comment: retired,  lives with sister and husband   Other Topics Concern  . Not on file   Social History Narrative   Lives with sister.    FAMILY HISTORY: Family History  Problem Relation Age of Onset  . Cancer Mother        leukemia  . Cancer Maternal Grandfather        possible cancer  . Heart attack Brother 50    Review of Systems  Constitutional: Negative.   HENT:  Negative.   Eyes: Negative.   Respiratory: Negative.   Cardiovascular: Negative.   Gastrointestinal: Negative.   Genitourinary: Negative.    Musculoskeletal: Negative.   Skin: Negative.   Neurological:       Right side of head and right arm numbness  Hematological: Negative.   Psychiatric/Behavioral: Negative.       PHYSICAL EXAMINATION  ECOG PERFORMANCE STATUS: 1 - Symptomatic but completely ambulatory  Vitals:   12/06/16 1052  BP: (!) 102/54  Pulse: 91  Resp: 18  Temp: 98.6 F (37 C)  SpO2: 100%    Physical Exam  Constitutional: He is oriented to person, place, and time and well-developed, well-nourished, and in no distress. No distress.  HENT:  Head: Normocephalic.  Mouth/Throat: Oropharynx is clear and moist. No oropharyngeal exudate.  Eyes: Conjunctivae are normal. Right eye exhibits no discharge. Left eye exhibits no discharge. No scleral icterus.  Neck: Normal range of motion. Neck supple.  Cardiovascular: Normal rate, regular rhythm, normal heart sounds and intact distal pulses.   Pulmonary/Chest: Effort normal and breath sounds normal. No respiratory distress. He has no wheezes. He has no rales.  Abdominal: Soft. Bowel sounds are normal. He exhibits no distension and no mass. There is no tenderness.  Musculoskeletal: Normal range of motion. He exhibits no edema.  Lymphadenopathy:    He has no cervical adenopathy.  Neurological: He is alert and oriented to person, place, and time. He exhibits normal muscle tone. Gait normal.  Skin: Skin is warm and dry. No rash noted. He is not diaphoretic. No  erythema. No pallor.  Psychiatric: Mood, memory, affect and judgment normal.  Vitals reviewed.   LABORATORY DATA:  CBC    Component Value Date/Time   WBC 9.5 11/20/2016 0704   RBC 3.71 (L) 11/20/2016 0704   HGB 8.7 (L) 11/20/2016 0704   HGB 9.9 (L) 11/02/2015 1133   HCT 28.7 (L) 11/20/2016 0704   HCT 32.3 (L) 11/02/2015 1133   PLT 257 11/20/2016 0704   PLT 278 11/02/2015 1133   MCV 77.4 (L) 11/20/2016 0704   MCV 83.7 11/02/2015 1133   MCH 23.5 (L) 11/20/2016 0704   MCHC 30.3 11/20/2016 0704   RDW 23.6 (H) 11/20/2016 0704   RDW 21.4 (H) 11/02/2015 1133   LYMPHSABS 1.0 11/20/2016 0120   LYMPHSABS 1.3 11/02/2015 1133   MONOABS 0.8 11/20/2016 0120   MONOABS 0.7 11/02/2015 1133   EOSABS 0.4 11/20/2016 0120   EOSABS 0.3 11/02/2015 1133   BASOSABS 0.0 11/20/2016 0120   BASOSABS 0.0 11/02/2015 1133    CMP     Component Value Date/Time   NA 137 11/22/2016 0212   K 4.5  11/22/2016 0212   CL 105 11/22/2016 0212   CO2 24 11/22/2016 0212   GLUCOSE 120 (H) 11/22/2016 0212   BUN 10 11/22/2016 0212   CREATININE 1.12 11/22/2016 0212   CALCIUM 9.2 11/22/2016 0212   PROT 6.4 (L) 11/20/2016 0120   ALBUMIN 3.3 (L) 11/20/2016 0120   AST 21 11/20/2016 0120   ALT 18 11/20/2016 0120   ALKPHOS 94 11/20/2016 0120   BILITOT 0.5 11/20/2016 0120   GFRNONAA >60 11/22/2016 0212   GFRAA >60 11/22/2016 0212    RADIOGRAPHIC STUDIES: Ct Abdomen Pelvis Wo Contrast  Result Date: 11/12/2016 CLINICAL DATA:  Nausea and shortness of Breath EXAM: CT ABDOMEN AND PELVIS WITHOUT CONTRAST TECHNIQUE: Multidetector CT imaging of the abdomen and pelvis was performed following the standard protocol without IV contrast. COMPARISON:  None. FINDINGS: Lower chest: Small bilateral pleural effusions are noted. Mild bibasilar atelectatic changes are seen. There are nodular changes identified in the right lower lobe as well as some smaller scattered nodules seen. The largest of these measures 15 mm on image number 80  of series 3. These are similar to that seen on recent chest CT from earlier in the same day. Hepatobiliary: No focal liver abnormality is seen. No gallstones, gallbladder wall thickening, or biliary dilatation. Pancreas: Unremarkable. No pancreatic ductal dilatation or surrounding inflammatory changes. Spleen: Normal in size without focal abnormality. Adrenals/Urinary Tract: The adrenal glands are within normal limits. The right kidney is unremarkable. The left kidney demonstrates a large mixed attenuation mass lesion which measures approximately the 11 cm in greatest dimension. This is similar to that seen on the prior CT of the chest. These changes are consistent with renal cell carcinoma. The bladder is within normal limits. Stomach/Bowel: Stomach is within normal limits. Appendix appears normal. No evidence of bowel wall thickening, distention, or inflammatory changes. Vascular/Lymphatic: Aortic atherosclerosis. No enlarged abdominal or pelvic lymph nodes. Reproductive: Prostate is enlarged in size. Other: No abdominal wall hernia or abnormality. No abdominopelvic ascites. Musculoskeletal: Degenerative changes of lumbar spine are noted. IMPRESSION: Large left renal mass consistent with renal cell carcinoma. There are changes consistent with metastatic disease in the lungs. Pleural effusions are also noted. Electronically Signed   By: Inez Catalina M.D.   On: 11/12/2016 16:52   Ct Angio Head W Or Wo Contrast  Result Date: 11/20/2016 CLINICAL DATA:  71 y/o  M; code stroke, follow-up angiogram. EXAM: CT ANGIOGRAPHY HEAD AND NECK TECHNIQUE: Multidetector CT imaging of the head and neck was performed using the standard protocol during bolus administration of intravenous contrast. Multiplanar CT image reconstructions and MIPs were obtained to evaluate the vascular anatomy. Carotid stenosis measurements (when applicable) are obtained utilizing NASCET criteria, using the distal internal carotid diameter as the  denominator. CONTRAST:  50 cc Isovue 370 COMPARISON:  11/20/2016 CT head.  11/16/2016 MRI of the abdomen. FINDINGS: CTA NECK FINDINGS Aortic arch: Bovine arch branching. Imaged portion shows no evidence of aneurysm or dissection. No significant stenosis of the major arch vessel origins. Mild calcific atherosclerosis. Right carotid system: No dissection or occlusion. Mixed plaque of the right carotid bifurcation with severe 70% proximal ICA stenosis. Left carotid system: No evidence of dissection, stenosis (50% or greater) or occlusion. The moderate mixed plaque of the carotid bifurcation with proximal ICA mild less than 50% stenosis. Vertebral arteries: Left dominant. No evidence of dissection or occlusion. Calcified plaque of the left vertebral artery origin with 50% mild-to-moderate stenosis. Skeleton: Moderate cervical spondylosis with a predominantly discogenic degenerative changes greatest  at the C5 through C7 levels. Uncovertebral and facet hypertrophy at C5-C7 encroach on the neural foramen. Mild bony canal stenosis at C5-6 and C6-7. Other neck: Borderline right lower paratracheal and AP window lymphadenopathy. Upper chest: Multiple pulmonary nodules measuring up to 10 mm in the left upper lobe (series 5, image 6) and up to 14 mm in right upper lobe (series 5, image 10). Smooth septal thickening of the lungs and hazy opacification compatible with mild pulmonary edema. Small bilateral pleural effusions. Review of the MIP images confirms the above findings CTA HEAD FINDINGS Anterior circulation: No large vessel occlusion or aneurysm. Calcified plaque of the carotid siphons with mild right paraclinoid and mild-to-moderate left distal cavernous stenosis. Posterior circulation: No significant stenosis, proximal occlusion, aneurysm, or vascular malformation. Mild non stenotic calcification of the left V4 segment. Left P3 segment occlusion (series 10, image 22, series 11, image 28, series 8 image 132). Venous  sinuses: As permitted by contrast timing, patent. Anatomic variants: Anterior communicating artery and right posterior communicating arteries are patent. No left posterior communicating artery identified, likely hypoplastic or absent. Review of the MIP images confirms the above findings IMPRESSION: CTA neck: 1. Severe 70% proximal ICA stenosis secondary to mixed plaque at the carotid bifurcation. 2. Mild less than 50% left proximal ICA stenosis secondary to mixed plaque of the carotid bifurcation. 3. Mild to moderate 50% left vertebral artery origin stenosis secondary to calcified plaque. 4. Multiple pulmonary nodules measuring up to 14 mm likely representing metastatic disease. CTA head: 1. Left P3 segment occlusion. Otherwise no large vessel occlusion or aneurysm of the circle of Willis. 2. Carotid siphon calcific plaque with short segments of mild right and mild-to-moderate left stenosis. These results were called by telephone at the time of interpretation on 11/20/2016 at 2:40 am to Dr. Samara Snide , who verbally acknowledged these results. Electronically Signed   By: Kristine Garbe M.D.   On: 11/20/2016 02:42   Dg Chest 2 View  Result Date: 11/11/2016 CLINICAL DATA:  Shortness of breath and cough. EXAM: CHEST  2 VIEW COMPARISON:  08/07/2012 FINDINGS: Low volume film. Cardiopericardial silhouette is at upper limits of normal for size. 1 cm nodular density right upper lobe is new in the interval. No pulmonary edema or pleural effusion. Patient is status post CABG. IMPRESSION: Low volume film with new 10 mm right upper lobe pulmonary nodule. CT chest without contrast recommended to further evaluate. Electronically Signed   By: Misty Stanley M.D.   On: 11/11/2016 14:44   Ct Head Wo Contrast  Result Date: 11/20/2016 CLINICAL DATA:  71 y/o  M; code stroke EXAM: CT HEAD WITHOUT CONTRAST TECHNIQUE: Contiguous axial images were obtained from the base of the skull through the vertex without intravenous  contrast. COMPARISON:  None. FINDINGS: Brain: No large acute stroke, intracranial hemorrhage, or focal mass effect. Mild chronic microvascular ischemic changes of white matter. Small lucencies in right anterior corona radiata and right posterior limb of internal capsule are compatible with age indeterminate chronic lacunar infarctions. Small chronic appearing infarct in left frontal subcortical white matter. Vascular: Moderate calcific atherosclerosis of the siphons. No hyperdense vessel identified Skull: Normal. Negative for fracture or focal lesion. Sinuses/Orbits: Small right-sided maxillary mucous retention cyst. Mild frontal sinus mucosal thickening. Normal aeration of mastoid air cells. Normal orbits Other: None. ASPECTS [facility] Stroke Program Early CT Score) - Ganglionic level infarction (caudate, lentiform nuclei, internal capsule, insula, M1-M3 cortex): 7 - Supraganglionic infarction (M4-M6 cortex): 3 Total score (0-10 with 10  being normal): 10 IMPRESSION: 1. Age-indeterminate lacunar infarcts in right corona radiata and right posterior limb of internal capsule. 2. No large acute vascular territory infarct, intracranial hemorrhage, or focal mass effect. 3. ASPECTS is 10 These results were called by telephone at the time of interpretation on 11/20/2016 at 1:47 am to Dr. Samara Snide , who verbally acknowledged these results. Electronically Signed   By: Kristine Garbe M.D.   On: 11/20/2016 01:48   Ct Angio Neck W Or Wo Contrast  Result Date: 11/20/2016 CLINICAL DATA:  71 y/o  M; code stroke, follow-up angiogram. EXAM: CT ANGIOGRAPHY HEAD AND NECK TECHNIQUE: Multidetector CT imaging of the head and neck was performed using the standard protocol during bolus administration of intravenous contrast. Multiplanar CT image reconstructions and MIPs were obtained to evaluate the vascular anatomy. Carotid stenosis measurements (when applicable) are obtained utilizing NASCET criteria, using the distal  internal carotid diameter as the denominator. CONTRAST:  50 cc Isovue 370 COMPARISON:  11/20/2016 CT head.  11/16/2016 MRI of the abdomen. FINDINGS: CTA NECK FINDINGS Aortic arch: Bovine arch branching. Imaged portion shows no evidence of aneurysm or dissection. No significant stenosis of the major arch vessel origins. Mild calcific atherosclerosis. Right carotid system: No dissection or occlusion. Mixed plaque of the right carotid bifurcation with severe 70% proximal ICA stenosis. Left carotid system: No evidence of dissection, stenosis (50% or greater) or occlusion. The moderate mixed plaque of the carotid bifurcation with proximal ICA mild less than 50% stenosis. Vertebral arteries: Left dominant. No evidence of dissection or occlusion. Calcified plaque of the left vertebral artery origin with 50% mild-to-moderate stenosis. Skeleton: Moderate cervical spondylosis with a predominantly discogenic degenerative changes greatest at the C5 through C7 levels. Uncovertebral and facet hypertrophy at C5-C7 encroach on the neural foramen. Mild bony canal stenosis at C5-6 and C6-7. Other neck: Borderline right lower paratracheal and AP window lymphadenopathy. Upper chest: Multiple pulmonary nodules measuring up to 10 mm in the left upper lobe (series 5, image 6) and up to 14 mm in right upper lobe (series 5, image 10). Smooth septal thickening of the lungs and hazy opacification compatible with mild pulmonary edema. Small bilateral pleural effusions. Review of the MIP images confirms the above findings CTA HEAD FINDINGS Anterior circulation: No large vessel occlusion or aneurysm. Calcified plaque of the carotid siphons with mild right paraclinoid and mild-to-moderate left distal cavernous stenosis. Posterior circulation: No significant stenosis, proximal occlusion, aneurysm, or vascular malformation. Mild non stenotic calcification of the left V4 segment. Left P3 segment occlusion (series 10, image 22, series 11, image 28,  series 8 image 132). Venous sinuses: As permitted by contrast timing, patent. Anatomic variants: Anterior communicating artery and right posterior communicating arteries are patent. No left posterior communicating artery identified, likely hypoplastic or absent. Review of the MIP images confirms the above findings IMPRESSION: CTA neck: 1. Severe 70% proximal ICA stenosis secondary to mixed plaque at the carotid bifurcation. 2. Mild less than 50% left proximal ICA stenosis secondary to mixed plaque of the carotid bifurcation. 3. Mild to moderate 50% left vertebral artery origin stenosis secondary to calcified plaque. 4. Multiple pulmonary nodules measuring up to 14 mm likely representing metastatic disease. CTA head: 1. Left P3 segment occlusion. Otherwise no large vessel occlusion or aneurysm of the circle of Willis. 2. Carotid siphon calcific plaque with short segments of mild right and mild-to-moderate left stenosis. These results were called by telephone at the time of interpretation on 11/20/2016 at 2:40 am to Dr. Karena Addison  AR[facility] , who verbally acknowledged these results. Electronically Signed   By: Kristine Garbe M.D.   On: 11/20/2016 02:42   Ct Chest Wo Contrast  Result Date: 11/12/2016 CLINICAL DATA:  Abnormal chest radiograph, pulmonary nodule EXAM: CT CHEST WITHOUT CONTRAST TECHNIQUE: Multidetector CT imaging of the chest was performed following the standard protocol without IV contrast. COMPARISON:  Chest radiographs dated 11/11/2016 FINDINGS: Cardiovascular: Heart is top-normal in size. No pericardial effusion. No evidence of thoracic aortic aneurysm. Atherosclerotic calcifications aortic arch. Three vessel coronary atherosclerosis. Postsurgical changes related to prior CABG. Mediastinum/Nodes: 15 mm short axis subcarinal node. Additional small mediastinal nodes which not meet pathologic CT size criteria. Visualized thyroid is unremarkable. Lungs/Pleura: Multiple bilateral pulmonary nodules,  approximately 12-15 in number, most of which are in the right lung, suspicious for metastases. Dominant nodules include: --10 mm nodule in the posterior left upper lobe (series 5/image 51) --19 mm nodule in the right upper lobe along the minor fissure (series 5/ image 60) --18 mm nodule in the posterior right lower lobe (series 5/ image 82) No focal consolidation. Mild patchy left lower lobe opacity, likely atelectasis. Small bilateral pleural effusions. No pneumothorax. Upper Abdomen: Visualized upper abdomen is notable for a 9.1 x 11.6 cm left renal mass, incompletely visualized/evaluated, compatible with renal cell carcinoma. Musculoskeletal: Mild degenerative changes the visualized thoracolumbar spine. Median sternotomy. No suspicious osseous lesions. IMPRESSION: Multiple bilateral pulmonary nodules, suspicious for metastases, measuring up to 19 mm in the right upper lobe. 15 mm short axis subcarinal node, indeterminate. 9.1 x 11.6 cm left renal mass, incompletely visualized/evaluated, compatible with renal cell carcinoma. This likely reflects the primary malignancy. Small bilateral pleural effusions. Aortic Atherosclerosis (ICD10-I70.0). Electronically Signed   By: Julian Hy M.D.   On: 11/12/2016 10:29   Mr Brain Wo Contrast  Result Date: 11/21/2016 CLINICAL DATA:  71 y/o  M; renal carcinoma and lung metastasis. EXAM: MRI HEAD WITHOUT CONTRAST TECHNIQUE: Axial DWI, coronal DWI, sagittal T1 FLAIR, and axial T2 weighted sequences were acquired. COMPARISON:  11/20/2016 CT head FINDINGS: Subcentimeter foci of reduced diffusion are present within the left posterolateral thalamus in the left occipital periventricular white matter likely representing acute/early subacute infarctions. There is no abnormal B0 DWI, T2, or T1 weighted signal to suggest hemorrhage. No significant mass effect. On the T2 weighted sequence there several T2 hyperintense foci in subcortical and periventricular white matter with  bifrontal predominance likely representing chronic microvascular ischemic changes and there is a small left frontal subcortical chronic white matter infarction. Mild brain parenchymal volume loss. Persistent normal proximal intracranial flow voids. Mild right maxillary sinus mucosal thickening and small mucous retention cysts. No abnormal signal of mastoid air cells. Orbits are unremarkable. IMPRESSION: 1. Subacute acute/early subacute subcentimeter infarctions within left thalamus and left periventricular occipital lobe. 2. No findings of acute hemorrhage or focal mass effect. 3. Suboptimal assessment for intracranial metastasis in the absence of intravenous contrast. Electronically Signed   By: Kristine Garbe M.D.   On: 11/21/2016 01:07   Mr Abdomen W Wo Contrast  Result Date: 11/16/2016 CLINICAL DATA:  71 year old male with history of renal mass noted on recent CT examination. Followup evaluation. EXAM: MRI ABDOMEN WITHOUT AND WITH CONTRAST TECHNIQUE: Multiplanar multisequence MR imaging of the abdomen was performed both before and after the administration of intravenous contrast. CONTRAST:  71mL MULTIHANCE GADOBENATE DIMEGLUMINE 529 MG/ML IV SOLN COMPARISON:  No priors. FINDINGS: Lower chest: Small bilateral pleural effusions lying dependently. Susceptibility artifact over the lower sternum, related to median  sternotomy wires. Hepatobiliary: No discrete cystic or solid hepatic lesions. No intra or extrahepatic biliary ductal dilatation. Gallbladder is normal in appearance. Pancreas: There are 3 small T1 hypointense, T2 hyperintense, nonenhancing lesions in the head of the pancreas and proximal body of the pancreas measuring up to 9 mm (axial image 15 of series 5), favored to represent small pancreatic pseudocysts. No larger pancreatic mass. No pancreatic ductal dilatation. No pancreatic or peripancreatic fluid or inflammatory changes. Spleen:  Unremarkable. Adrenals/Urinary Tract: The lesion of  concern in the upper pole of the left kidney is heterogeneous in signal intensity on T1 and T2 weighted images, and demonstrates extensive but heterogeneous predominantly peripheral enhancement on post gadolinium images. This lesion measures approximately 11.8 x 9.7 x 9.9 cm (axial image 70 of series 1102 and coronal image 24 of series 12) and appears likely encapsulated within Gerota's fascia at this time. The lesion comes in proximity to the left renal hilum, without definite extension into the left renal vein at this time. Left renal vein is widely patent. Right kidney and bilateral adrenal glands are normal in appearance. There is no hydroureteronephrosis in the visualized portions of the abdomen. Stomach/Bowel: Visualized portions are unremarkable. Vascular/Lymphatic: Aortic atherosclerosis without evidence of aneurysm in the abdominal vasculature. Left renal vein appears patent without definite tumor thrombus. IVC is widely patent. No lymphadenopathy noted in the abdomen. Other: No significant volume of ascites noted in the visualized portions of the peritoneal cavity. Musculoskeletal: No definite aggressive appearing osseous lesions are noted in the visualized portions of the skeleton. IMPRESSION: 1. 11.8 x 9.7 x 9.9 cm heterogeneously enhancing left renal mass centered in the upper pole of the left kidney with extension toward the left renal hilum, without definite left renal vein invasion. This lesion appears likely encapsulated within Gerota's fascia at this time. There is no associated lymphadenopathy and no definite signs of metastatic disease in the abdomen. 2. Small bilateral pleural effusions lying dependently. 3. Tiny cystic lesions in the head and proximal body of the pancreas measuring 9 mm or less in size. These are nonspecific. Follow-up MRI of the abdomen with and without IV gadolinium with MRCP is recommended in 2 years to ensure the stability these lesions. This recommendation follows ACR  consensus guidelines: Management of Incidental Pancreatic Cysts: A White Paper of the ACR Incidental Findings Committee. J Am Coll Radiol 0109;32:355-732. 4. Aortic atherosclerosis. Aortic Atherosclerosis (ICD10-I70.0). Electronically Signed   By: Vinnie Langton M.D.   On: 11/16/2016 09:08   No results found.  ASSESSMENT and THERAPY PLAN:   Renal mass This is a pleasant 12-year-old gentleman with multiple medical problems including history of coronary artery disease and chronic anemia who has a large left renal mass and multiple bilateral pulmonary nodules suspicious for metastatic renal cell carcinoma.  The patient has had a urology consult during his hospitalization, but he has not been cleared from a cardiac standpoint to proceed with nephrectomy. Follow-up with urology is scheduled 01/23/2017.   The patient was seen with Dr. Julien Nordmann. Recommend proceeding with a PET scan to evaluate for the extent of disease. Once we have the results, we will consider sending him for a biopsy of one of the pulmonary nodules.  We will see him back in our office in approximately 2 weeks to review the PET scan results.  Iron deficiency anemia due to chronic blood loss Patient has a history of persistent anemia and has received IV iron infusions in the past. We will recheck a CBC and iron  studies at his next visit with [place] in 2 weeks.    Orders Placed This Encounter  Procedures  . NM PET Image Initial (PI) Skull Base To Thigh    Standing Status:   Future    Standing Expiration Date:   12/06/2017    Order Specific Question:   If indicated for the ordered procedure, I authorize the administration of a radiopharmaceutical per Radiology protocol    Answer:   Yes    Order Specific Question:   Preferred imaging location?    Answer:   [facility]    Comments:   if no availability please consider [facility]    Order Specific Question:   Radiology Contrast Protocol - do NOT remove file path    Answer:    [email or web address]    Order Specific Question:   Reason for Exam additional comments    Answer:   Kidney mass and lung nodules. Eval for mets.  Marland Kitchen CBC with Differential/Platelet    Standing Status:   Future    Standing Expiration Date:   12/06/2017  . Comprehensive metabolic panel    Standing Status:   Future    Standing Expiration Date:   12/06/2017  . Ferritin    Standing Status:   Future    Standing Expiration Date:   12/06/2017  . Iron and TIBC    Standing Status:   Future    Standing Expiration Date:   12/06/2017    All questions were answered. The patient knows to call the clinic with any problems, questions or concerns. We can certainly see the patient much sooner if necessary.  Mikey Bussing, NP 12/06/2016   ADDENDUM: Hematology/Oncology Attending: I had a face to face encounter with the patient. I recommended his care plan. This is a very pleasant 71 years old white male with highly suspicious metastatic renal cell carcinoma with large left adrenal mass in addition to bilateral pulmonary nodules. The patient also had recent myocardial infarction as well as stroke. Has been recovering slowly from the symptoms. His workup and evaluation for the renal cell carcinoma has been on hold. He was seen by urologist and was considered for left nephrectomy but unfortunately his next appointment is in November 2018. I had a lengthy discussion with the patient today about his condition. I recommended for him to have a PET scan for further evaluation of his disease. I would consider the patient for CT-guided core biopsy of one of the pulmonary nodules originally renal mass for tissue diagnosis after the PET scan. For the recent coronary artery disease and a stroke, the patient will continue with his current medication for now. I will see him back for follow-up visit in 2 weeks for reevaluation and further discussion of his treatment options. The patient was advised to call  immediately if he has any concerning symptoms in the interval. Disclaimer: This note was dictated with voice recognition software. Similar sounding words can inadvertently be transcribed and may be missed upon review. Eilleen Kempf, MD 12/09/16

## 2016-12-06 NOTE — Assessment & Plan Note (Signed)
This is a pleasant 71-year-old gentleman with multiple medical problems including history of coronary artery disease and chronic anemia who has a large left renal mass and multiple bilateral pulmonary nodules suspicious for metastatic renal cell carcinoma.  The patient has had a urology consult during his hospitalization, but he has not been cleared from a cardiac standpoint to proceed with nephrectomy. Follow-up with urology is scheduled 01/23/2017.   The patient was seen with Dr. Julien Nordmann. Recommend proceeding with a PET scan to evaluate for the extent of disease. Once we have the results, we will consider sending him for a biopsy of one of the pulmonary nodules.  We will see him back in our office in approximately 2 weeks to review the PET scan results.

## 2016-12-09 ENCOUNTER — Encounter: Payer: Self-pay | Admitting: Oncology

## 2016-12-15 ENCOUNTER — Ambulatory Visit (HOSPITAL_COMMUNITY): Payer: Medicare Other

## 2016-12-18 ENCOUNTER — Ambulatory Visit: Payer: Medicare Other | Admitting: Oncology

## 2016-12-18 ENCOUNTER — Telehealth: Payer: Self-pay | Admitting: Oncology

## 2016-12-18 ENCOUNTER — Other Ambulatory Visit: Payer: Medicare Other

## 2016-12-18 DIAGNOSIS — C642 Malignant neoplasm of left kidney, except renal pelvis: Secondary | ICD-10-CM

## 2016-12-18 HISTORY — DX: Malignant neoplasm of left kidney, except renal pelvis: C64.2

## 2016-12-18 NOTE — Telephone Encounter (Signed)
Spoke with patient regarding his added appointments on 10/1.

## 2016-12-26 ENCOUNTER — Encounter: Payer: Self-pay | Admitting: Cardiovascular Disease

## 2016-12-26 ENCOUNTER — Ambulatory Visit (HOSPITAL_COMMUNITY)
Admission: RE | Admit: 2016-12-26 | Discharge: 2016-12-26 | Disposition: A | Discharge status: Home / Self Care | Payer: Medicare Other | Source: Ambulatory Visit | Attending: Cardiovascular Disease | Admitting: Cardiovascular Disease

## 2016-12-26 ENCOUNTER — Ambulatory Visit: Payer: Medicare Other | Admitting: Cardiovascular Disease

## 2016-12-26 ENCOUNTER — Ambulatory Visit (INDEPENDENT_AMBULATORY_CARE_PROVIDER_SITE_OTHER): Payer: Medicare Other | Admitting: Cardiovascular Disease

## 2016-12-26 VITALS — BP 100/52 | HR 86 | Ht 71.0 in | Wt 166.0 lb

## 2016-12-26 DIAGNOSIS — E785 Hyperlipidemia, unspecified: Secondary | ICD-10-CM

## 2016-12-26 DIAGNOSIS — C642 Malignant neoplasm of left kidney, except renal pelvis: Secondary | ICD-10-CM | POA: Diagnosis not present

## 2016-12-26 DIAGNOSIS — C7800 Secondary malignant neoplasm of unspecified lung: Secondary | ICD-10-CM | POA: Diagnosis not present

## 2016-12-26 DIAGNOSIS — Z01810 Encounter for preprocedural cardiovascular examination: Secondary | ICD-10-CM | POA: Diagnosis not present

## 2016-12-26 DIAGNOSIS — I6521 Occlusion and stenosis of right carotid artery: Secondary | ICD-10-CM | POA: Diagnosis not present

## 2016-12-26 DIAGNOSIS — I739 Peripheral vascular disease, unspecified: Secondary | ICD-10-CM

## 2016-12-26 DIAGNOSIS — I700 Atherosclerosis of aorta: Secondary | ICD-10-CM | POA: Diagnosis not present

## 2016-12-26 DIAGNOSIS — I255 Ischemic cardiomyopathy: Secondary | ICD-10-CM

## 2016-12-26 DIAGNOSIS — I2510 Atherosclerotic heart disease of native coronary artery without angina pectoris: Secondary | ICD-10-CM

## 2016-12-26 DIAGNOSIS — N2889 Other specified disorders of kidney and ureter: Secondary | ICD-10-CM | POA: Insufficient documentation

## 2016-12-26 DIAGNOSIS — I6523 Occlusion and stenosis of bilateral carotid arteries: Secondary | ICD-10-CM | POA: Insufficient documentation

## 2016-12-26 NOTE — Patient Instructions (Signed)
Medication Instructions:  DECREASE- Aspirin 81 mg daily  If you need a refill on your cardiac medications before your next appointment, please call your pharmacy.  Labwork: None Ordered   Testing/Procedures: None Ordered  Follow-Up: Your physician wants you to follow-up in: 2 Months.    Thank you for choosing [facility] at [facility]!!

## 2016-12-26 NOTE — Progress Notes (Signed)
Cardiology Office Note   Date:  12/26/2016   ID:  Trevor [name], DOB [birth year], MRN [id number]  PCP:  [name], MD  Cardiologist:   [name], MD   Chief Complaint  Patient presents with  . Follow-up    6 months. Patient says that he has been having a numb sensation on his right side since he got out of the hospital.      History of Present Illness: Trevor [name] is a 71 y.o. male who presents for a followup visit regarding coronary artery disease status post  CABG,  ischemic cardiomyopathy And peripheral arterial disease. He presented in April of 2014 with anterior ST elevation myocardial infarction and was found to have significant three-vessel coronary artery disease and moderate left main stenosis. The patient underwent CABG at that time.  Echocardiogram in July of 2014 showed improved LV systolic function with an ejection fraction of 45-50% with apical hypokinesis. He underwent a nuclear stress test in December 2016 due to worsening exertional dyspnea. The test showed fixed inferior wall defect without significant ischemia. Ejection fraction was 53%. Overall it was a low risk study.  He is known to have peripheral arterial disease with moderately reduced ABI on the right due to severe SFA disease. He is being treated medically due to lack of claudication.  He was hospitalized in August with severe symptomatic anemia with hemoglobin of 5.6. He was also found to have mildly elevated troponin consistent with non-ST elevation myocardial infarction. The patient was transfused. He underwent cardiac catheterization which showed occluded SVG to RCA and SVG to OM. His LIMA to LAD was patent. Ejection fraction was 20-25%. No revascularization could be done with PCI given his symptomatic anemia and also the fact that he was found to have renal mass highly suggestive of cancer with pulmonary nodules. The patient was treated medically. He returned 2 days later with a stroke which was  confirmed by MRI.  Since then, he has been doing reasonably well with no chest pain or shortness of breath. He is scheduled for PET scan tomorrow then follow-up with oncology.   Past Medical History:  Diagnosis Date  . Anemia   . Coronary artery disease    06/2012: Anterior ST elevation myocardial infarction. Cardiac catheterization showed significant three-vessel coronary artery disease, ejection fraction 35-40%. The patient underwent CABG with LIMA to LAD, SVG to ramus and sequential SVG to right PDA/PL  . Diabetes mellitus without complication ([place])   . Hyperlipidemia   . Ischemic cardiomyopathy    Ejection fraction of 35-40% initially, 45 - 50% on echo 2014, 53% by perfusion study 2016    Past Surgical History:  Procedure Laterality Date  . CORONARY ARTERY BYPASS GRAFT N/A 07/05/2012   Procedure: CORONARY ARTERY BYPASS GRAFTING (CABG);  Surgeon: Ivin Poot, MD;  Location: [facility];  Service: Open Heart Surgery;  Laterality: N/A;  . INTRAOPERATIVE TRANSESOPHAGEAL ECHOCARDIOGRAM N/A 07/05/2012   Procedure: INTRAOPERATIVE TRANSESOPHAGEAL ECHOCARDIOGRAM;  Surgeon: Ivin Poot, MD;  Location: [place];  Service: Open Heart Surgery;  Laterality: N/A;  . LEFT HEART CATH AND CORS/GRAFTS ANGIOGRAPHY N/A 11/14/2016   Procedure: LEFT HEART CATH AND CORS/GRAFTS ANGIOGRAPHY;  Surgeon: Belva Crome, MD;  Location: [facility];  Service: Cardiovascular;  Laterality: N/A;  . LEFT HEART CATHETERIZATION WITH CORONARY ANGIOGRAM N/A 07/04/2012   Procedure: LEFT HEART CATHETERIZATION WITH CORONARY ANGIOGRAM;  Surgeon: Wellington Hampshire, MD;  Location: [place] [facility];  Service: Cardiovascular;  Laterality: N/A;  Current Outpatient Prescriptions  Medication Sig Dispense Refill  . aspirin 81 MG EC tablet Take 4 tablets (325 mg total) by mouth daily. 30 tablet 12  . atorvastatin (LIPITOR) 40 MG tablet Take 1 tablet (40 mg total) by mouth daily. 30 tablet 6  . carvedilol (COREG) 3.125 MG tablet  Take 1 tablet (3.125 mg total) by mouth 2 (two) times daily. 180 tablet 2  . ferrous sulfate 325 (65 FE) MG tablet Take 325 mg by mouth daily with breakfast.    . lisinopril (PRINIVIL,ZESTRIL) 2.5 MG tablet Take 2 tablets (5 mg total) by mouth daily. 60 tablet 0  . metFORMIN (GLUCOPHAGE) 1000 MG tablet Take 1,000 mg by mouth 2 (two) times daily.   0  . pantoprazole (PROTONIX) 40 MG tablet Take 1 tablet (40 mg total) by mouth at bedtime. 30 tablet 0   No current facility-administered medications for this visit.     Allergies:   Patient has no known allergies.    Social History:  The patient  reports that he quit smoking about 4 years ago. He has a 50.00 pack-year smoking history. He has never used smokeless tobacco. He reports that he does not drink alcohol or use drugs.   Family History:  The patient's family history includes Cancer in his maternal grandfather and mother; Heart attack (age of onset: 8) in his brother.    ROS:  Please see the history of present illness.   Otherwise, review of systems are positive for none.   All other systems are reviewed and negative.    PHYSICAL EXAM: VS:  BP (!) 100/52   Pulse 86   Ht 5\' 11"  (1.803 m)   Wt 166 lb (75.3 kg)   BMI 23.15 kg/m  , BMI Body mass index is 23.15 kg/m. GEN: Well nourished, well developed, in no acute distress  HEENT: normal  Neck: no JVD, or masses. Bilateral carotid bruits Cardiac: RRR; no murmurs, rubs, or gallops,no edema  Respiratory:  clear to auscultation bilaterally, normal work of breathing GI: soft, nontender, nondistended, + BS MS: no deformity or atrophy  Skin: warm and dry, no rash Neuro:  Strength and sensation are intact Psych: euthymic mood, full affect   EKG:  EKG is not ordered today.    Recent Labs: 11/20/2016: ALT 18; Hemoglobin 8.7; Platelets 257 11/22/2016: BUN 10; Creatinine, Ser 1.12; Magnesium 1.7; Potassium 4.5; Sodium 137    Lipid Panel    Component Value Date/Time   CHOL 77  11/20/2016 0704   TRIG 64 11/20/2016 0704   HDL 30 (L) 11/20/2016 0704   CHOLHDL 2.6 11/20/2016 0704   VLDL 13 11/20/2016 0704   LDLCALC 34 11/20/2016 0704      Wt Readings from Last 3 Encounters:  12/26/16 166 lb (75.3 kg)  12/06/16 167 lb 9.6 oz (76 kg)  11/21/16 174 lb 4.8 oz (79.1 kg)         ASSESSMENT AND PLAN:  1.  Coronary artery disease: Status post CABG. Status post non-ST elevation myocardial infarction in August of this year in the setting of severe symptomatic anemia. Unfortunately, cardiac catheterization showed occluded vein grafts. His only patent graft was the LIMA to LAD. The good news is that his ejection fraction improved to 45% from 20%. I reviewed his cardiac catheterization. He will probably require complex PCI of the right coronary artery with atherectomy but not able to do at the present time as most likely he will require nephrectomy. If drug-eluting stents are placed, the  surgery would have to be delayed at least 6 months. I decreased aspirin to 81 mg once daily.  2. Ischemic cardiomyopathy: Most recent ejection fraction was 45-50%. Continue treatment with carvedilol and lisinopril.  3. Peripheral arterial disease: The patient has evidence of severe right mid SFA stenosis . He denies claudication at this time. Continue medical therapy.   4. Hyperlipidemia: Continue treatment with atorvastatin with a target LDL of less than 70.  5. Moderate bilateral carotid disease. Doppler today showed progression on the left side to 40-59%. Right side is stable at 60-79%. Continue medical therapy.  6. preop cardiovascular evaluation for possible nephrectomy. The patient has a renal mass. His myocardial infarction was more than 2 months ago and his ejection fraction improved to 45%. Thus, I don't think he is still high risk for surgery. He can proceed at an overall moderate risk. He is having PET scan done tomorrow and then follow-up with oncology. The best course of  action might be to postpone any coronary revascularization until after nephrectomy, biopsies are done and treatment plan for presumed cancer are formulated.  Disposition:   FU with me in 2 months  Signed,  [name], MD  12/26/2016 9:11 AM    [place]

## 2016-12-27 ENCOUNTER — Ambulatory Visit (HOSPITAL_COMMUNITY)
Admission: RE | Admit: 2016-12-27 | Discharge: 2016-12-27 | Disposition: A | Discharge status: Home / Self Care | Payer: Medicare Other | Source: Ambulatory Visit | Attending: Oncology | Admitting: Oncology

## 2016-12-27 DIAGNOSIS — N2889 Other specified disorders of kidney and ureter: Secondary | ICD-10-CM | POA: Diagnosis not present

## 2016-12-27 LAB — GLUCOSE, CAPILLARY: GLUCOSE-CAPILLARY: 112 mg/dL — AB (ref 65–99)

## 2016-12-27 MED ORDER — FLUDEOXYGLUCOSE F - 18 (FDG) INJECTION
8.2900 | Freq: Once | INTRAVENOUS | Status: AC | PRN
  Administered 2016-12-27: 8.29 via INTRAVENOUS

## 2016-12-28 ENCOUNTER — Ambulatory Visit (HOSPITAL_BASED_OUTPATIENT_CLINIC_OR_DEPARTMENT_OTHER): Payer: Medicare Other | Admitting: Oncology

## 2016-12-28 ENCOUNTER — Encounter: Payer: Self-pay | Admitting: Oncology

## 2016-12-28 ENCOUNTER — Other Ambulatory Visit (HOSPITAL_BASED_OUTPATIENT_CLINIC_OR_DEPARTMENT_OTHER): Payer: Medicare Other

## 2016-12-28 VITALS — BP 103/55 | HR 95 | Temp 97.9°F | Resp 18 | Ht 71.0 in | Wt 167.7 lb

## 2016-12-28 DIAGNOSIS — R200 Anesthesia of skin: Secondary | ICD-10-CM

## 2016-12-28 DIAGNOSIS — R911 Solitary pulmonary nodule: Secondary | ICD-10-CM | POA: Diagnosis not present

## 2016-12-28 DIAGNOSIS — D649 Anemia, unspecified: Secondary | ICD-10-CM

## 2016-12-28 DIAGNOSIS — N2889 Other specified disorders of kidney and ureter: Secondary | ICD-10-CM

## 2016-12-28 DIAGNOSIS — E279 Disorder of adrenal gland, unspecified: Secondary | ICD-10-CM

## 2016-12-28 DIAGNOSIS — D500 Iron deficiency anemia secondary to blood loss (chronic): Secondary | ICD-10-CM

## 2016-12-28 LAB — CBC WITH DIFFERENTIAL/PLATELET
BASO%: 0.2 % (ref 0.0–2.0)
BASOS ABS: 0 10*3/uL (ref 0.0–0.1)
EOS ABS: 0.5 10*3/uL (ref 0.0–0.5)
EOS%: 5.7 % (ref 0.0–7.0)
HCT: 29.3 % — ABNORMAL LOW (ref 38.4–49.9)
HGB: 9 g/dL — ABNORMAL LOW (ref 13.0–17.1)
LYMPH#: 0.9 10*3/uL (ref 0.9–3.3)
LYMPH%: 10.4 % — ABNORMAL LOW (ref 14.0–49.0)
MCH: 24.7 pg — ABNORMAL LOW (ref 27.2–33.4)
MCHC: 30.7 g/dL — ABNORMAL LOW (ref 32.0–36.0)
MCV: 80.5 fL (ref 79.3–98.0)
MONO#: 0.6 10*3/uL (ref 0.1–0.9)
MONO%: 7 % (ref 0.0–14.0)
NEUT#: 6.8 10*3/uL — ABNORMAL HIGH (ref 1.5–6.5)
NEUT%: 76.7 % — ABNORMAL HIGH (ref 39.0–75.0)
Platelets: 293 10*3/uL (ref 140–400)
RBC: 3.64 10*6/uL — ABNORMAL LOW (ref 4.20–5.82)
RDW: 23.5 % — AB (ref 11.0–14.6)
WBC: 8.9 10*3/uL (ref 4.0–10.3)

## 2016-12-28 LAB — COMPREHENSIVE METABOLIC PANEL
ALBUMIN: 3.6 g/dL (ref 3.5–5.0)
ALK PHOS: 105 U/L (ref 40–150)
ALT: 10 U/L (ref 0–55)
AST: 12 U/L (ref 5–34)
Anion Gap: 11 mEq/L (ref 3–11)
BUN: 27.9 mg/dL — ABNORMAL HIGH (ref 7.0–26.0)
CO2: 23 mEq/L (ref 22–29)
Calcium: 10.1 mg/dL (ref 8.4–10.4)
Chloride: 105 mEq/L (ref 98–109)
Creatinine: 1.3 mg/dL (ref 0.7–1.3)
EGFR: 56 mL/min/{1.73_m2} — AB (ref >60)
GLUCOSE: 110 mg/dL (ref 70–140)
SODIUM: 139 meq/L (ref 136–145)
Total Bilirubin: 0.44 mg/dL (ref 0.20–1.20)
Total Protein: 7.3 g/dL (ref 6.4–8.3)

## 2016-12-28 LAB — FERRITIN: Ferritin: 167 ng/ml (ref 22–316)

## 2016-12-28 LAB — IRON AND TIBC
%SAT: 12 % — ABNORMAL LOW (ref 20–55)
IRON: 25 ug/dL — AB (ref 42–163)
TIBC: 209 ug/dL (ref 202–409)
UIBC: 184 ug/dL (ref 117–376)

## 2016-12-28 NOTE — Assessment & Plan Note (Signed)
Patient has a history of persistent anemia and has received IV iron infusions in the past. Hemoglobin is stable. Iron studies are pending. Continue oral iron.

## 2016-12-28 NOTE — Assessment & Plan Note (Signed)
This is a pleasant 71 year old gentleman with multiple medical problems including history of coronary artery disease and chronic anemia who has a large left renal mass and multiple bilateral pulmonary nodules suspicious for metastatic renal cell carcinoma.  The patient was seen with Dr. Julien Nordmann. PET scan results were discussed with the patient. He was shown the images. Discussed that he has hypermetabolic areas in the left kidney and pulmonary nodules. Recommend he proceed with a biopsy of one of these areas.   I spoke with interventional radiology. They recommend an ultrasound guided core biopsy of the left renal mass.An order has been placed for this to be done early next week. We will plan to bring him back to our office 2-3 days after the biopsy to discuss the results. We discussed with the patient that he may be a candidate for immunotherapy pending the biopsy results. Further discussion at his next visit.  Follow-up with urology is scheduled 01/23/2017.

## 2016-12-28 NOTE — Progress Notes (Signed)
[facility] Follow up:    [address], [place] [address]   DIAGNOSIS: left kidney mass suspicious for renal cell carcinoma and bilateral pulmonary nodules  SUMMARY OF ONCOLOGIC HISTORY:  No history exists.    CURRENT THERAPY: None  INTERVAL HISTORY: Trevor [name] 71 y.o. male returns for routine follow-up. The patient Is been feeling fine. He reports ongoing right head and arm numbness. Denies fevers and chills. Denies chest pain, shortness of breath, cough, hemoptysis. Denies abdominal pain, nausea, vomiting, constipation diarrhea. Denies hematuria. The patient is here today for evaluation and discussion of his recent PET scan results.   Patient Active Problem List   Diagnosis Date Noted  . Renal mass 12/06/2016  . 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 NSTEMI (non-ST elevated myocardial infarction) ([place])   . Anemia 11/11/2016  . Iron deficiency anemia due to chronic blood loss 09/20/2015  . Recent weight loss 09/20/2015  . Bilateral leg pain 02/21/2015  . Coronary artery disease   . Ischemic cardiomyopathy   . Hyperlipidemia   . ST elevation myocardial infarction (STEMI) of anterior wall, initial episode of care ([place] [facility]) 07/04/2012  . Hyperglycemia 07/04/2012    has No Known Allergies.  MEDICAL HISTORY: Past Medical History:  Diagnosis Date  . Anemia   . Coronary artery disease    06/2012: Anterior ST elevation myocardial infarction. Cardiac catheterization showed significant three-vessel coronary artery disease, ejection fraction 35-40%. The patient underwent CABG with LIMA to LAD, SVG to ramus and sequential SVG to right PDA/PL  . Diabetes mellitus without complication ([place])   . Hyperlipidemia   . Ischemic cardiomyopathy    Ejection fraction of 35-40%  initially, 45 - 50% on echo 2014, 53% by perfusion study 2016    SURGICAL HISTORY: Past Surgical History:  Procedure Laterality Date  . CORONARY ARTERY BYPASS GRAFT N/A 07/05/2012   Procedure: CORONARY ARTERY BYPASS GRAFTING (CABG);  Surgeon: Ivin Poot, MD;  Location: [facility];  Service: Open Heart Surgery;  Laterality: N/A;  . INTRAOPERATIVE TRANSESOPHAGEAL ECHOCARDIOGRAM N/A 07/05/2012   Procedure: INTRAOPERATIVE TRANSESOPHAGEAL ECHOCARDIOGRAM;  Surgeon: Ivin Poot, MD;  Location: [facility];  Service: Open Heart Surgery;  Laterality: N/A;  . LEFT HEART CATH AND CORS/GRAFTS ANGIOGRAPHY N/A 11/14/2016   Procedure: LEFT HEART CATH AND CORS/GRAFTS ANGIOGRAPHY;  Surgeon: Belva Crome, MD;  Location: [facility];  Service: Cardiovascular;  Laterality: N/A;  . LEFT HEART CATHETERIZATION WITH CORONARY ANGIOGRAM N/A 07/04/2012   Procedure: LEFT HEART CATHETERIZATION WITH CORONARY ANGIOGRAM;  Surgeon: Wellington Hampshire, MD;  Location: [facility];  Service: Cardiovascular;  Laterality: N/A;    SOCIAL HISTORY: Social History   Social History  . Marital status: Widowed    Spouse name: N/A  . Number of children: N/A  . Years of education: N/A   Occupational History  . Retired Psychologist, educational    Social History Main Topics  . Smoking status: Former Smoker    Packs/day: 1.00    Years: 50.00    Quit date: 04/20/2012  . Smokeless tobacco: Never Used  . Alcohol use No  . Drug use: No  . Sexual activity: Not on file     Comment: retired, lives with sister and husband   Other Topics Concern  . Not on file   Social History Narrative  Lives with sister.    FAMILY HISTORY: Family History  Problem Relation Age of Onset  . Cancer Mother        leukemia  . Cancer Maternal Grandfather        possible cancer  . Heart attack Brother 50    Review of Systems  Constitutional: Negative.   HENT:  Negative.   Eyes: Negative.   Respiratory: Negative.   Cardiovascular: Negative.    Gastrointestinal: Negative.   Genitourinary: Negative.    Musculoskeletal: Negative.  Negative for gait problem.  Neurological: Positive for numbness. Negative for dizziness, gait problem and headaches.  Hematological: Negative.   Psychiatric/Behavioral: Negative.       PHYSICAL EXAMINATION  ECOG PERFORMANCE STATUS: 1 - Symptomatic but completely ambulatory  Vitals:   12/28/16 0934  BP: (!) 103/55  Pulse: 95  Resp: 18  Temp: 97.9 F (36.6 C)  SpO2: 100%    Physical Exam  Constitutional: He is oriented to person, place, and time and well-developed, well-nourished, and in no distress. No distress.  HENT:  Head: Normocephalic.  Mouth/Throat: Oropharynx is clear and moist. No oropharyngeal exudate.  Eyes: Conjunctivae are normal. Right eye exhibits no discharge. Left eye exhibits no discharge. No scleral icterus.  Neck: Normal range of motion. Neck supple.  Cardiovascular: Normal rate, regular rhythm, normal heart sounds and intact distal pulses.   Pulmonary/Chest: Breath sounds normal. No respiratory distress. He has no wheezes. He has no rales.  Abdominal: Soft. Bowel sounds are normal. He exhibits no distension and no mass. There is no tenderness.  Musculoskeletal: Normal range of motion. He exhibits no edema.  Lymphadenopathy:    He has no cervical adenopathy.  Neurological: He is alert and oriented to person, place, and time. He exhibits normal muscle tone. Gait normal. Coordination normal.  Skin: Skin is warm and dry. No rash noted. He is not diaphoretic. No erythema. No pallor.  Psychiatric: Mood, memory, affect and judgment normal.  Vitals reviewed.   LABORATORY DATA:  CBC    Component Value Date/Time   WBC 8.9 12/28/2016 0903   WBC 9.5 11/20/2016 0704   RBC 3.64 (L) 12/28/2016 0903   RBC 3.71 (L) 11/20/2016 0704   HGB 9.0 (L) 12/28/2016 0903   HCT 29.3 (L) 12/28/2016 0903   PLT 293 12/28/2016 0903   MCV 80.5 12/28/2016 0903   MCH 24.7 (L) 12/28/2016 0903    MCH 23.5 (L) 11/20/2016 0704   MCHC 30.7 (L) 12/28/2016 0903   MCHC 30.3 11/20/2016 0704   RDW 23.5 (H) 12/28/2016 0903   LYMPHSABS 0.9 12/28/2016 0903   MONOABS 0.6 12/28/2016 0903   EOSABS 0.5 12/28/2016 0903   BASOSABS 0.0 12/28/2016 0903    CMP     Component Value Date/Time   NA 139 12/28/2016 0903   K 5.6 No visable hemolysis (H) 12/28/2016 0903   CL 105 11/22/2016 0212   CO2 23 12/28/2016 0903   GLUCOSE 110 12/28/2016 0903   BUN 27.9 (H) 12/28/2016 0903   CREATININE 1.3 12/28/2016 0903   CALCIUM 10.1 12/28/2016 0903   PROT 7.3 12/28/2016 0903   ALBUMIN 3.6 12/28/2016 0903   AST 12 12/28/2016 0903   ALT 10 12/28/2016 0903   ALKPHOS 105 12/28/2016 0903   BILITOT 0.44 12/28/2016 0903   GFRNONAA >60 11/22/2016 0212   GFRAA >60 11/22/2016 0212   RADIOGRAPHIC STUDIES:  Nm Pet Image Initial (pi) Skull Base To Thigh  Result Date: 12/27/2016 CLINICAL DATA:  Initial treatment strategy for left renal  cell carcinoma. Pulmonary nodules on CT. EXAM: NUCLEAR MEDICINE PET SKULL BASE TO THIGH TECHNIQUE: 8.29 mCi F-18 FDG was injected intravenously. Full-ring PET imaging was performed from the skull base to thigh after the radiotracer. CT data was obtained and used for attenuation correction and anatomic localization. FASTING BLOOD GLUCOSE:  Value: 112 mg/dl COMPARISON:  CTs 11/12/2016.  MRI 11/16/2016. FINDINGS: NECK No hypermetabolic cervical lymph nodes are identified.There are no lesions of the pharyngeal mucosal space. Activity within the orbits appears physiologic. CHEST There are no hypermetabolic mediastinal, hilar or axillary lymph nodes. Again demonstrated are multiple, predominately right-sided pulmonary nodules consistent with metastatic disease. The largest of these nodules are hypermetabolic, including right upper lobe lesions measuring 12 mm on image 22 (SUV max 5.1) and 13 mm on image 28 (SUV max 3.8). Some of the smaller nodules are too small to characterize by PET. There  is aortic, great vessel and coronary artery atherosclerosis post CABG. Previously noted pleural effusions have resolved. ABDOMEN/PELVIS There is no hypermetabolic activity within the liver, adrenal glands, spleen or pancreas. The large heterogeneous left renal mass demonstrates peripheral hypermetabolic activity consistent with renal cell carcinoma. SUV max is 10.1. There are no hypermetabolic lymph nodes within the abdomen or pelvis. There is diffuse aortic and branch vessel atherosclerosis. Mild enlargement of the prostate gland without suspicious metabolic activity. SKELETON There is no hypermetabolic activity to suggest osseous metastatic disease. IMPRESSION: 1. The large left renal cell carcinoma and its pulmonary metastases are hypermetabolic. These metastases are larger and more numerous on the right, not significantly changed from previous chest CT. 2. No other evidence of metastatic disease within the neck, chest, abdomen or pelvis. 3.  Aortic Atherosclerosis (ICD10-I70.0). Electronically Signed   By: Richardean Sale M.D.   On: 12/27/2016 11:35   ASSESSMENT and THERAPY PLAN:   Renal mass This is a pleasant 71 year old gentleman with multiple medical problems including history of coronary artery disease and chronic anemia who has a large left renal mass and multiple bilateral pulmonary nodules suspicious for metastatic renal cell carcinoma.  The patient was seen with Dr. Julien Nordmann. PET scan results were discussed with the patient. He was shown the images. Discussed that he has hypermetabolic areas in the left kidney and pulmonary nodules. Recommend he proceed with a biopsy of one of these areas.   I spoke with interventional radiology. They recommend an ultrasound guided core biopsy of the left renal mass.An order has been placed for this to be done early next week. We will plan to bring him back to our office 2-3 days after the biopsy to discuss the results. We discussed with the patient that he  may be a candidate for immunotherapy pending the biopsy results. Further discussion at his next visit.  Follow-up with urology is scheduled 01/23/2017.   Anemia Patient has a history of persistent anemia and has received IV iron infusions in the past. Hemoglobin is stable. Iron studies are pending. Continue oral iron.   Orders Placed This Encounter  Procedures  . US BIOPSY (KIDNEY)    Standing Status:   Future    Standing Expiration Date:   02/27/2018    Order Specific Question:   Lab orders requested (DO NOT place separate lab orders, these will be automatically ordered during procedure specimen collection):    Answer:   Surgical Pathology    Order Specific Question:   Reason for Exam (SYMPTOM  OR DIAGNOSIS REQUIRED)    Answer:   Left renal mass with pulmonary nodules. Hypermetabolic  activity on PET scan. Send core biopsy.    Order Specific Question:   Preferred imaging location?    Answer:   [facility]  . CBC with Differential/Platelet    Standing Status:   Future    Standing Expiration Date:   12/28/2017  . Comprehensive metabolic panel    Standing Status:   Future    Standing Expiration Date:   12/28/2017    All questions were answered. The patient knows to call the clinic with any problems, questions or concerns. We can certainly see the patient much sooner if necessary.  Mikey Bussing, NP 12/28/2016   ADDENDUM: Hematology/Oncology Attending: I had a face to face encounter with the patient today. I recommended his care plan. This is a very pleasant 71 years old white male with highly suspicious metastatic renal cell carcinoma resented with large left renal mass in addition to bilateral pulmonary nodules. The patient underwent a PET scan recently for evaluation of his disease. I personally and independently reviewed the PET scan results and showed the images to the patient today. His PET scan showed hypermetabolic left adrenal mass in addition to hypermetabolic  bilateral pulmonary nodules. I discussed with the patient referral to interventional radiology for consideration of ultrasound guided core biopsy of the left adrenal mass for tissue diagnosis. The patient was seen recently by his cardiologist and he was considered borderline for any surgical resection of the renal mass at this point but could be considered in the future if needed. The patient is also scheduled for MRI of the brain next week. I would see him back for follow-up visit in around 10 days for discussion of his treatment options based on the renal biopsy. I would likely consider him for treatment of his immunotherapy with compression of chemotherapy Ipilumuumab and Nivolumab. The patient was advised to call immediately if she has any concerning symptoms in the interval.  Disclaimer: This note was dictated with voice recognition software. Similar sounding words can inadvertently be transcribed and may be missed upon review. Eilleen Kempf, MD 12/28/16

## 2016-12-29 ENCOUNTER — Telehealth: Payer: Self-pay | Admitting: Internal Medicine

## 2016-12-29 NOTE — Telephone Encounter (Signed)
Spoke to patient regarding upcoming October appointments. °

## 2017-01-05 ENCOUNTER — Other Ambulatory Visit: Payer: Self-pay | Admitting: Radiology

## 2017-01-08 ENCOUNTER — Ambulatory Visit (HOSPITAL_COMMUNITY)
Admission: RE | Admit: 2017-01-08 | Discharge: 2017-01-08 | Disposition: A | Discharge status: Home / Self Care | Payer: Medicare Other | Source: Ambulatory Visit | Attending: Oncology | Admitting: Oncology

## 2017-01-08 ENCOUNTER — Encounter (HOSPITAL_COMMUNITY): Payer: Self-pay

## 2017-01-08 DIAGNOSIS — Z806 Family history of leukemia: Secondary | ICD-10-CM | POA: Diagnosis not present

## 2017-01-08 DIAGNOSIS — Z8249 Family history of ischemic heart disease and other diseases of the circulatory system: Secondary | ICD-10-CM | POA: Diagnosis not present

## 2017-01-08 DIAGNOSIS — Z8673 Personal history of transient ischemic attack (TIA), and cerebral infarction without residual deficits: Secondary | ICD-10-CM | POA: Insufficient documentation

## 2017-01-08 DIAGNOSIS — N2889 Other specified disorders of kidney and ureter: Secondary | ICD-10-CM

## 2017-01-08 DIAGNOSIS — I255 Ischemic cardiomyopathy: Secondary | ICD-10-CM | POA: Insufficient documentation

## 2017-01-08 DIAGNOSIS — I252 Old myocardial infarction: Secondary | ICD-10-CM | POA: Diagnosis not present

## 2017-01-08 DIAGNOSIS — E119 Type 2 diabetes mellitus without complications: Secondary | ICD-10-CM | POA: Insufficient documentation

## 2017-01-08 DIAGNOSIS — Z7984 Long term (current) use of oral hypoglycemic drugs: Secondary | ICD-10-CM | POA: Diagnosis not present

## 2017-01-08 DIAGNOSIS — Z79899 Other long term (current) drug therapy: Secondary | ICD-10-CM | POA: Diagnosis not present

## 2017-01-08 DIAGNOSIS — R918 Other nonspecific abnormal finding of lung field: Secondary | ICD-10-CM | POA: Insufficient documentation

## 2017-01-08 DIAGNOSIS — Z7982 Long term (current) use of aspirin: Secondary | ICD-10-CM | POA: Diagnosis not present

## 2017-01-08 DIAGNOSIS — C642 Malignant neoplasm of left kidney, except renal pelvis: Secondary | ICD-10-CM | POA: Insufficient documentation

## 2017-01-08 DIAGNOSIS — Z87891 Personal history of nicotine dependence: Secondary | ICD-10-CM | POA: Insufficient documentation

## 2017-01-08 DIAGNOSIS — Z951 Presence of aortocoronary bypass graft: Secondary | ICD-10-CM | POA: Diagnosis not present

## 2017-01-08 DIAGNOSIS — Z9889 Other specified postprocedural states: Secondary | ICD-10-CM | POA: Insufficient documentation

## 2017-01-08 DIAGNOSIS — Z955 Presence of coronary angioplasty implant and graft: Secondary | ICD-10-CM | POA: Diagnosis not present

## 2017-01-08 DIAGNOSIS — E785 Hyperlipidemia, unspecified: Secondary | ICD-10-CM | POA: Insufficient documentation

## 2017-01-08 HISTORY — DX: Transient cerebral ischemic attack, unspecified: G45.9

## 2017-01-08 LAB — CBC WITH DIFFERENTIAL/PLATELET
BASOS ABS: 0 10*3/uL (ref 0.0–0.1)
Basophils Relative: 0 %
Eosinophils Absolute: 0.3 10*3/uL (ref 0.0–0.7)
Eosinophils Relative: 3 %
HEMATOCRIT: 27.6 % — AB (ref 39.0–52.0)
Hemoglobin: 8.7 g/dL — ABNORMAL LOW (ref 13.0–17.0)
LYMPHS ABS: 1.5 10*3/uL (ref 0.7–4.0)
Lymphocytes Relative: 17 %
MCH: 25.2 pg — ABNORMAL LOW (ref 26.0–34.0)
MCHC: 31.5 g/dL (ref 30.0–36.0)
MCV: 80 fL (ref 78.0–100.0)
MONO ABS: 0.5 10*3/uL (ref 0.1–1.0)
MONOS PCT: 6 %
Neutro Abs: 6.8 10*3/uL (ref 1.7–7.7)
Neutrophils Relative %: 74 %
PLATELETS: 314 10*3/uL (ref 150–400)
RBC: 3.45 MIL/uL — AB (ref 4.22–5.81)
RDW: 22.8 % — AB (ref 11.5–15.5)
WBC: 9.1 10*3/uL (ref 4.0–10.5)

## 2017-01-08 LAB — BASIC METABOLIC PANEL
Anion gap: 13 (ref 5–15)
BUN: 22 mg/dL — AB (ref 6–20)
CO2: 24 mmol/L (ref 22–32)
Calcium: 9.9 mg/dL (ref 8.9–10.3)
Chloride: 101 mmol/L (ref 101–111)
Creatinine, Ser: 1.1 mg/dL (ref 0.61–1.24)
Glucose, Bld: 118 mg/dL — ABNORMAL HIGH (ref 65–99)
POTASSIUM: 5 mmol/L (ref 3.5–5.1)
SODIUM: 138 mmol/L (ref 135–145)

## 2017-01-08 LAB — PROTIME-INR
INR: 1.02
Prothrombin Time: 13.3 seconds (ref 11.4–15.2)

## 2017-01-08 LAB — GLUCOSE, CAPILLARY: GLUCOSE-CAPILLARY: 106 mg/dL — AB (ref 65–99)

## 2017-01-08 MED ORDER — MIDAZOLAM HCL 2 MG/2ML IJ SOLN
INTRAMUSCULAR | Status: AC
  Filled 2017-01-08: qty 2

## 2017-01-08 MED ORDER — SODIUM CHLORIDE 0.9 % IV SOLN
INTRAVENOUS | Status: DC
  Administered 2017-01-08: 11:00:00 via INTRAVENOUS

## 2017-01-08 MED ORDER — FENTANYL CITRATE (PF) 100 MCG/2ML IJ SOLN
INTRAMUSCULAR | Status: AC | PRN
  Administered 2017-01-08 (×2): 50 ug via INTRAVENOUS

## 2017-01-08 MED ORDER — LIDOCAINE-EPINEPHRINE (PF) 2 %-1:200000 IJ SOLN
INTRAMUSCULAR | Status: AC
  Filled 2017-01-08: qty 20

## 2017-01-08 MED ORDER — FENTANYL CITRATE (PF) 100 MCG/2ML IJ SOLN
INTRAMUSCULAR | Status: AC
  Filled 2017-01-08: qty 2

## 2017-01-08 MED ORDER — MIDAZOLAM HCL 2 MG/2ML IJ SOLN
INTRAMUSCULAR | Status: AC | PRN
  Administered 2017-01-08 (×2): 1 mg via INTRAVENOUS

## 2017-01-08 NOTE — Discharge Instructions (Signed)

## 2017-01-08 NOTE — Procedures (Signed)
Pre Procedure Dx: Left sided renal mass Post Procedural Dx: Same  Technically successful US guided biopsy of left sided renal mass.  EBL: Trace  No immediate complications.   Ronny Bacon, MD Pager #: [phone]

## 2017-01-08 NOTE — Consult Note (Signed)
Chief Complaint: Patient was seen in consultation today for image guided left renal mass biopsy  Referring Physician(s): Curcio,Kristin R/ Mohamed,M  Supervising Physician: Sandi Mariscal  Patient Status: [facility] - Out[facility]  History of Present Illness: Trevor [name] is a 71 y.o. male with history of coronary artery disease with prior CABG, recent TIA, diabetes, ischemic cardiomyopathy, and recently noted lung nodules as well as left renal mass. PET scan on 12/27/16 revealed hypermetabolic activity in pulmonary nodules and left renal mass. He has no prior history of cancer. He was a prior smoker. He presents today for image guided left renal mass biopsy for further evaluation.  Past Medical History:  Diagnosis Date  . Anemia   . Coronary artery disease    06/2012: Anterior ST elevation myocardial infarction. Cardiac catheterization showed significant three-vessel coronary artery disease, ejection fraction 35-40%. The patient underwent CABG with LIMA to LAD, SVG to ramus and sequential SVG to right PDA/PL  . Diabetes mellitus without complication ([place])   . Hyperlipidemia   . Ischemic cardiomyopathy    Ejection fraction of 35-40% initially, 45 - 50% on echo 2014, 53% by perfusion study 2016    Past Surgical History:  Procedure Laterality Date  . CORONARY ARTERY BYPASS GRAFT N/A 07/05/2012   Procedure: CORONARY ARTERY BYPASS GRAFTING (CABG);  Surgeon: Ivin Poot, MD;  Location: [facility];  Service: Open Heart Surgery;  Laterality: N/A;  . INTRAOPERATIVE TRANSESOPHAGEAL ECHOCARDIOGRAM N/A 07/05/2012   Procedure: INTRAOPERATIVE TRANSESOPHAGEAL ECHOCARDIOGRAM;  Surgeon: Ivin Poot, MD;  Location: [facility];  Service: Open Heart Surgery;  Laterality: N/A;  . LEFT HEART CATH AND CORS/GRAFTS ANGIOGRAPHY N/A 11/14/2016   Procedure: LEFT HEART CATH AND CORS/GRAFTS ANGIOGRAPHY;  Surgeon: Belva Crome, MD;  Location: [facility];  Service: Cardiovascular;  Laterality: N/A;  . LEFT HEART  CATHETERIZATION WITH CORONARY ANGIOGRAM N/A 07/04/2012   Procedure: LEFT HEART CATHETERIZATION WITH CORONARY ANGIOGRAM;  Surgeon: Wellington Hampshire, MD;  Location: [facility];  Service: Cardiovascular;  Laterality: N/A;    Allergies: Patient has no known allergies.  Medications: Prior to Admission medications   Medication Sig Start Date End Date Taking? Authorizing Provider  aspirin EC 81 MG tablet Take 81 mg by mouth daily.    [provider]  atorvastatin (LIPITOR) 40 MG tablet Take 1 tablet (40 mg total) by mouth daily. 05/20/13   Wellington Hampshire, MD  carvedilol (COREG) 3.125 MG tablet Take 1 tablet (3.125 mg total) by mouth 2 (two) times daily. 08/24/15   Wellington Hampshire, MD  ferrous sulfate 325 (65 FE) MG tablet Take 325 mg by mouth daily with breakfast.    [provider]  lisinopril (PRINIVIL,ZESTRIL) 2.5 MG tablet Take 2 tablets (5 mg total) by mouth daily. 11/22/16   Patteson, Arlan Organ, NP  metFORMIN (GLUCOPHAGE) 1000 MG tablet Take 1,000 mg by mouth 2 (two) times daily.  11/19/15   [provider]  pantoprazole (PROTONIX) 40 MG tablet Take 1 tablet (40 mg total) by mouth at bedtime. 11/22/16   Patteson, Arlan Organ, NP     Family History  Problem Relation Age of Onset  . Cancer Mother        leukemia  . Cancer Maternal Grandfather        possible cancer  . Heart attack Brother 91    Social History   Social History  . Marital status: Widowed    Spouse name: N/A  . Number of children: N/A  . Years of  education: N/A   Occupational History  . Retired Psychologist, educational    Social History Main Topics  . Smoking status: Former Smoker    Packs/day: 1.00    Years: 50.00    Quit date: 04/20/2012  . Smokeless tobacco: Never Used  . Alcohol use No  . Drug use: No  . Sexual activity: Not Asked     Comment: retired, lives with sister and husband   Other Topics Concern  . None   Social History Narrative   Lives with sister.      Review of Systems  currently denies fever, headache, chest pain, cough, nausea, dysuria ,vomiting or bleeding. He does have some mild intermittent left flank pain as well as some dyspnea with exertion and occasional paresthesias of right first and second fingers.  Vital Signs: BP 114/70 (BP Location: Right Arm)   Pulse 93   Temp 97.7 F (36.5 C) (Oral)   Resp 18   Ht 5\' 11"  (1.803 m)   Wt 164 lb 12.8 oz (74.8 kg)   SpO2 100%   BMI 22.98 kg/m   Physical Exam awake, alert. Chest clear to auscultation bilaterally. Heart with regular rate and rhythm. Abdomen soft, positive bowel sounds, nontender. No lower extremity edema.  Imaging: Nm Pet Image Initial (pi) Skull Base To Thigh  Result Date: 12/27/2016 CLINICAL DATA:  Initial treatment strategy for left renal cell carcinoma. Pulmonary nodules on CT. EXAM: NUCLEAR MEDICINE PET SKULL BASE TO THIGH TECHNIQUE: 8.29 mCi F-18 FDG was injected intravenously. Full-ring PET imaging was performed from the skull base to thigh after the radiotracer. CT data was obtained and used for attenuation correction and anatomic localization. FASTING BLOOD GLUCOSE:  Value: 112 mg/dl COMPARISON:  CTs 11/12/2016.  MRI 11/16/2016. FINDINGS: NECK No hypermetabolic cervical lymph nodes are identified.There are no lesions of the pharyngeal mucosal space. Activity within the orbits appears physiologic. CHEST There are no hypermetabolic mediastinal, hilar or axillary lymph nodes. Again demonstrated are multiple, predominately right-sided pulmonary nodules consistent with metastatic disease. The largest of these nodules are hypermetabolic, including right upper lobe lesions measuring 12 mm on image 22 (SUV max 5.1) and 13 mm on image 28 (SUV max 3.8). Some of the smaller nodules are too small to characterize by PET. There is aortic, great vessel and coronary artery atherosclerosis post CABG. Previously noted pleural effusions have resolved. ABDOMEN/PELVIS There is no hypermetabolic activity within  the liver, adrenal glands, spleen or pancreas. The large heterogeneous left renal mass demonstrates peripheral hypermetabolic activity consistent with renal cell carcinoma. SUV max is 10.1. There are no hypermetabolic lymph nodes within the abdomen or pelvis. There is diffuse aortic and branch vessel atherosclerosis. Mild enlargement of the prostate gland without suspicious metabolic activity. SKELETON There is no hypermetabolic activity to suggest osseous metastatic disease. IMPRESSION: 1. The large left renal cell carcinoma and its pulmonary metastases are hypermetabolic. These metastases are larger and more numerous on the right, not significantly changed from previous chest CT. 2. No other evidence of metastatic disease within the neck, chest, abdomen or pelvis. 3.  Aortic Atherosclerosis (ICD10-I70.0). Electronically Signed   By: Richardean Sale M.D.   On: 12/27/2016 11:35    Labs:  CBC:  Recent Labs  11/15/16 0340 11/20/16 0120 11/20/16 0127 11/20/16 0704 12/28/16 0903  WBC 7.5 10.7*  --  9.5 8.9  HGB 8.4* 9.0* 10.2* 8.7* 9.0*  HCT 27.7* 29.9* 30.0* 28.7* 29.3*  PLT 280 258  --  257 293    COAGS:  Recent  Labs  11/12/16 0833 11/14/16 0916 11/20/16 0120  INR 1.13 1.08 1.00  APTT 35  --  31    BMP:  Recent Labs  11/14/16 1832 11/15/16 0340 11/20/16 0120 11/20/16 0127 11/22/16 0212 12/28/16 0903  NA  --  135 136 136 137 139  K  --  4.8 4.9 5.0 4.5 5.6 No visable hemolysis*  CL  --  103 106 105 105  --   CO2  --  24 20*  --  24 23  GLUCOSE  --  118* 167* 169* 120* 110  BUN  --  15 11 14 10  27.9*  CALCIUM  --  8.9 9.3  --  9.2 10.1  CREATININE 1.06 1.09 1.04 1.00 1.12 1.3  GFRNONAA >60 >60 >60  --  >60  --   GFRAA >60 >60 >60  --  >60  --     LIVER FUNCTION TESTS:  Recent Labs  11/13/16 0054 11/20/16 0120 12/28/16 0903  BILITOT 0.8 0.5 0.44  AST 29 21 12   ALT 18 18 10   ALKPHOS 92 94 105  PROT 6.8 6.4* 7.3  ALBUMIN 3.3* 3.3* 3.6    TUMOR MARKERS: No  results for input(s): AFPTM, CEA, CA199, CHROMGRNA in the last 8760 hours.  Assessment and Plan:  71 y.o. male with history of coronary artery disease with prior CABG, recent TIA, diabetes, ischemic cardiomyopathy, and recently noted lung nodules as well as left renal mass. PET scan on 12/27/16 revealed hypermetabolic activity in pulmonary nodules and left renal mass. He has no prior history of cancer. He was a prior smoker. He presents today for image guided left renal mass biopsy for further evaluation.Risks and benefits discussed with the patient including, but not limited to bleeding, infection, damage to adjacent structures or low yield requiring additional tests.All of the patient's questions were answered, patient is agreeable to proceed.Consent signed and in chart.LABS PENDING.     Thank you for this interesting consult.  I greatly enjoyed meeting Santhosh Gulino and look forward to participating in their care.  A copy of this report was sent to the requesting provider on this date.  Electronically Signed: D. Rowe Robert, PA-C 01/08/2017, 11:22 AM   I spent a total of 25 minutes in face to face in clinical consultation, greater than 50% of which was counseling/coordinating care for image guided left renal mass biopsy

## 2017-01-09 ENCOUNTER — Ambulatory Visit (INDEPENDENT_AMBULATORY_CARE_PROVIDER_SITE_OTHER): Payer: Medicare Other | Admitting: Neurology

## 2017-01-09 ENCOUNTER — Encounter: Payer: Self-pay | Admitting: Neurology

## 2017-01-09 ENCOUNTER — Ambulatory Visit: Payer: Self-pay | Admitting: Neurology

## 2017-01-09 VITALS — BP 108/61 | HR 98 | Ht 71.0 in | Wt 169.0 lb

## 2017-01-09 DIAGNOSIS — I63432 Cerebral infarction due to embolism of left posterior cerebral artery: Secondary | ICD-10-CM | POA: Diagnosis not present

## 2017-01-09 NOTE — Patient Instructions (Signed)
I had a long d/w patient about his recent embolic stroke, risk for recurrent stroke/TIAs, personally independently reviewed imaging studies and stroke evaluation results and answered questions.Continue aspirin 325 mg daily  for secondary stroke prevention and maintain strict control of hypertension with blood pressure goal below 130/90, diabetes with hemoglobin A1c goal below 6.5% and lipids with LDL cholesterol goal below 70 mg/dL. I also advised the patient to eat a healthy diet with plenty of whole grains, cereals, fruits and vegetables, exercise regularly and maintain ideal body weight Followup in the future with my nurse practitioner in 6 months or call earlier if necessary Stroke Prevention Some medical conditions and behaviors are associated with an increased chance of having a stroke. You may prevent a stroke by making healthy choices and managing medical conditions. How can I reduce my risk of having a stroke?  Stay physically active. Get at least 30 minutes of activity on most or all days.  Do not smoke. It may also be helpful to avoid exposure to secondhand smoke.  Limit alcohol use. Moderate alcohol use is considered to be: ? No more than 2 drinks per day for men. ? No more than 1 drink per day for nonpregnant women.  Eat healthy foods. This involves: ? Eating 5 or more servings of fruits and vegetables a day. ? Making dietary changes that address high blood pressure (hypertension), high cholesterol, diabetes, or obesity.  Manage your cholesterol levels. ? Making food choices that are high in fiber and low in saturated fat, trans fat, and cholesterol may control cholesterol levels. ? Take any prescribed medicines to control cholesterol as directed by your health care provider.  Manage your diabetes. ? Controlling your carbohydrate and sugar intake is recommended to manage diabetes. ? Take any prescribed medicines to control diabetes as directed by your health care  provider.  Control your hypertension. ? Making food choices that are low in salt (sodium), saturated fat, trans fat, and cholesterol is recommended to manage hypertension. ? Ask your health care provider if you need treatment to lower your blood pressure. Take any prescribed medicines to control hypertension as directed by your health care provider. ? If you are 32-7 years of age, have your blood pressure checked every 3-5 years. If you are 37 years of age or older, have your blood pressure checked every year.  Maintain a healthy weight. ? Reducing calorie intake and making food choices that are low in sodium, saturated fat, trans fat, and cholesterol are recommended to manage weight.  Stop drug abuse.  Avoid taking birth control pills. ? Talk to your health care provider about the risks of taking birth control pills if you are over 2 years old, smoke, get migraines, or have ever had a blood clot.  Get evaluated for sleep disorders (sleep apnea). ? Talk to your health care provider about getting a sleep evaluation if you snore a lot or have excessive sleepiness.  Take medicines only as directed by your health care provider. ? For some people, aspirin or blood thinners (anticoagulants) are helpful in reducing the risk of forming abnormal blood clots that can lead to stroke. If you have the irregular heart rhythm of atrial fibrillation, you should be on a blood thinner unless there is a good reason you cannot take them. ? Understand all your medicine instructions.  Make sure that other conditions (such as anemia or atherosclerosis) are addressed. Get help right away if:  You have sudden weakness or numbness of the face, arm,  or leg, especially on one side of the body.  Your face or eyelid droops to one side.  You have sudden confusion.  You have trouble speaking (aphasia) or understanding.  You have sudden trouble seeing in one or both eyes.  You have sudden trouble walking.  You  have dizziness.  You have a loss of balance or coordination.  You have a sudden, severe headache with no known cause.  You have new chest pain or an irregular heartbeat. Any of these symptoms may represent a serious problem that is an emergency. Do not wait to see if the symptoms will go away. Get medical help at once. Call your local emergency services (911 in [place] Do not drive yourself to the hospital. This information is not intended to replace advice given to you by your health care provider. Make sure you discuss any questions you have with your health care provider. Document Released: 04/13/2004 Document Revised: 08/12/2015 Document Reviewed: 09/06/2012 Elsevier Interactive Patient Education  2017 [employer].

## 2017-01-09 NOTE — Progress Notes (Signed)
[facility] [address] [place]. [place] [address] [phone]       OFFICE FOLLOW-UP NOTE  [name] Date of Birth:  1946/01/08 Medical Record Number:  [id number]   HPI: Mr Trevor [name] is a 71 year Caucasian male seen today for the first office follow-up visit following hospital admission for stroke in September 2018. History is up 10 from the patient, review of electronic medical records and  I personally reviewed imaging films.Trevor [name] an 71 y.o.malerecently discharged from hospital with diagnosis of renal cell carcinoma with lung and kidney mets, acute on chronic heart failure with low EF, DM II, HLD, CAD s/p CABG, 70% R ICA stenosis, who presents as a stroke alert for sudden onset R side weakness. LSN was 11 pm on 11/20/2016, and patient realized he could not move right arm or leg. Patient also had difficulty answering some questions and speech was slow. BP was 403 systolic on arrrival. Glucose was 174. Date last known well: 9.3.2018.Time last known well: 11 pm.NIHSS 7.Modified Rankin:0.MRI scan of the brain showed subacute to early acute subcentimeter infarcts involving left thalamus, left periventricular occipital lobe. MRI with contrast was ordered but patient refused it due to claustrophobia. CT angiogram of the head and neck showed left P3 segment occlusion and 70% proximal right ICA and 50% left proximal ICA stenosis.50% stenosis at the left vertebral artery origin as well. Transthoracic echo showed reduced ejection fraction of 45-50% but no clot. LDL cholesterol was study from grams percent and hemoglobin A1c was 5.9. Patient was previously on aspirin 81 mg and this was changed to 325 mg  He states his done well since discharge. He still has some mild right-sided weakness with diminished fine motor skills in his handwriting is not good. He has no therapy needs. He did undergo PET scan as an outpatient which confirmed hypermetabolic nodules in both lungs and left  superomedial renal lesion. He underwent lung biopsy yesterday and results are yet pending. He states is starting aspirin well without bruising or bleeding. Blood pressure is well controlled and today it is 108/70 Is tolerating Lipitor without muscle aches and pains. His sugars are all under good control. He has no new neurological complaints today. ROS:   14 system review of systems is positive for  Urination problems, blood in the urine, diminished fine motor skills in the hands and all other systems negative  PMH:  Past Medical History:  Diagnosis Date  . Anemia   . Coronary artery disease    06/2012: Anterior ST elevation myocardial infarction. Cardiac catheterization showed significant three-vessel coronary artery disease, ejection fraction 35-40%. The patient underwent CABG with LIMA to LAD, SVG to ramus and sequential SVG to right PDA/PL  . Diabetes mellitus without complication ([place])   . Hyperlipidemia   . Ischemic cardiomyopathy    Ejection fraction of 35-40% initially, 45 - 50% on echo 2014, 53% by perfusion study 2016  . Stroke ([place])   . TIA (transient ischemic attack) 10/2016    Social History:  Social History   Social History  . Marital status: Widowed    Spouse name: N/A  . Number of children: N/A  . Years of education: N/A   Occupational History  . Retired Psychologist, educational    Social History Main Topics  . Smoking status: Former Smoker    Packs/day: 1.00    Years: 50.00    Quit date: 04/20/2012  . Smokeless tobacco: Never Used  . Alcohol use No  . Drug use: No  .  Sexual activity: Not on file     Comment: retired, lives with sister and husband   Other Topics Concern  . Not on file   Social History Narrative   Lives with sister.    Medications:   Current Outpatient Prescriptions on File Prior to Visit  Medication Sig Dispense Refill  . aspirin EC 81 MG tablet Take 81 mg by mouth daily.    Marland Kitchen atorvastatin (LIPITOR) 40 MG tablet Take 1 tablet (40 mg total) by  mouth daily. 30 tablet 6  . carvedilol (COREG) 3.125 MG tablet Take 1 tablet (3.125 mg total) by mouth 2 (two) times daily. 180 tablet 2  . ferrous sulfate 325 (65 FE) MG tablet Take 325 mg by mouth daily with breakfast.    . lisinopril (PRINIVIL,ZESTRIL) 2.5 MG tablet Take 2 tablets (5 mg total) by mouth daily. 60 tablet 0  . metFORMIN (GLUCOPHAGE) 1000 MG tablet Take 1,000 mg by mouth 2 (two) times daily.   0  . pantoprazole (PROTONIX) 40 MG tablet Take 1 tablet (40 mg total) by mouth at bedtime. 30 tablet 0   No current facility-administered medications on file prior to visit.     Allergies:  No Known Allergies  Physical Exam General: well developed, well nourished elderly Caucasian male, seated, in no evident distress Head: head normocephalic and atraumatic.  Neck: supple with no carotid or supraclavicular bruits Cardiovascular: regular rate and rhythm, no murmurs Musculoskeletal: no deformity Skin:  no rash/petichiae Vascular:  Normal pulses all extremities Vitals:   01/09/17 1423  BP: 108/61  Pulse: 98   Neurologic Exam Mental Status: Awake and fully alert. Oriented to place and time. Recent and remote memory intact. Attention span, concentration and fund of knowledge appropriate. Mood and affect appropriate.  Cranial Nerves: Fundoscopic exam reveals sharp disc margins. Pupils equal, briskly reactive to light. Extraocular movements full without nystagmus. Visual fields full to confrontation. Hearing intact. Facial sensation intact. Face, tongue, palate moves normally and symmetrically.  Motor: Normal bulk and tone. Normal strength in all tested extremity muscles.diminished fine finger movements on the right. Orbits left over right upper extremity. Sensory.: intact to touch ,pinprick .position and vibratory sensation.  Coordination: Rapid alternating movements normal in all extremities. Finger-to-nose and heel-to-shin performed accurately bilaterally. Gait and Station: Arises from  chair without difficulty. Stance is normal. Gait demonstrates normal stride length and balance . Able to heel, toe and tandem walk without difficulty.  Reflexes: 1+ and symmetric. Toes downgoing.   NIHSS  0 Modified Rankin  2  ASSESSMENT: 71 year male with left thalamic and subcortical lacunar infarct in September 2018 with vascular risk factors of hyperlipidemia, hypertension, diabetes and hypercoagulability from suspected renal cancer    PLAN: I had a long d/w patient about his recent embolic stroke, risk for recurrent stroke/TIAs, personally independently reviewed imaging studies and stroke evaluation results and answered questions.Continue aspirin 325 mg daily  for secondary stroke prevention and maintain strict control of hypertension with blood pressure goal below 130/90, diabetes with hemoglobin A1c goal below 6.5% and lipids with LDL cholesterol goal below 70 mg/dL. I also advised the patient to eat a healthy diet with plenty of whole grains, cereals, fruits and vegetables, exercise regularly and maintain ideal body weight Followup in the future with my nurse practitioner in 6 months or call earlier if necessary Greater than 50% of time during this 25 minute visit was spent on counseling,explanation of diagnosis of lacunar stroke, planning of further management, discussion with patient and family  and coordination of care Antony Contras, MD  [facility] [address] [place], [place] [address]  Phone [phone] Fax [phone] Note: This document was prepared with digital dictation and possible smart phrase technology. Any transcriptional errors that result from this process are unintentional

## 2017-01-15 ENCOUNTER — Ambulatory Visit (HOSPITAL_BASED_OUTPATIENT_CLINIC_OR_DEPARTMENT_OTHER): Payer: Medicare Other | Admitting: Oncology

## 2017-01-15 ENCOUNTER — Telehealth: Payer: Self-pay

## 2017-01-15 ENCOUNTER — Ambulatory Visit (HOSPITAL_COMMUNITY)
Admission: RE | Admit: 2017-01-15 | Discharge: 2017-01-15 | Disposition: A | Discharge status: Home / Self Care | Payer: Medicare Other | Source: Ambulatory Visit | Attending: Oncology | Admitting: Oncology

## 2017-01-15 ENCOUNTER — Other Ambulatory Visit (HOSPITAL_BASED_OUTPATIENT_CLINIC_OR_DEPARTMENT_OTHER): Payer: Medicare Other

## 2017-01-15 ENCOUNTER — Ambulatory Visit: Payer: Medicare Other

## 2017-01-15 ENCOUNTER — Encounter: Payer: Self-pay | Admitting: Oncology

## 2017-01-15 VITALS — BP 118/71 | HR 102 | Temp 98.0°F | Resp 18 | Ht 71.0 in | Wt 167.4 lb

## 2017-01-15 DIAGNOSIS — D500 Iron deficiency anemia secondary to blood loss (chronic): Secondary | ICD-10-CM

## 2017-01-15 DIAGNOSIS — D649 Anemia, unspecified: Secondary | ICD-10-CM | POA: Diagnosis not present

## 2017-01-15 DIAGNOSIS — C642 Malignant neoplasm of left kidney, except renal pelvis: Secondary | ICD-10-CM | POA: Diagnosis not present

## 2017-01-15 DIAGNOSIS — C7801 Secondary malignant neoplasm of right lung: Secondary | ICD-10-CM

## 2017-01-15 DIAGNOSIS — C649 Malignant neoplasm of unspecified kidney, except renal pelvis: Secondary | ICD-10-CM | POA: Insufficient documentation

## 2017-01-15 DIAGNOSIS — N2889 Other specified disorders of kidney and ureter: Secondary | ICD-10-CM

## 2017-01-15 DIAGNOSIS — C7802 Secondary malignant neoplasm of left lung: Secondary | ICD-10-CM | POA: Diagnosis not present

## 2017-01-15 DIAGNOSIS — R531 Weakness: Secondary | ICD-10-CM | POA: Diagnosis not present

## 2017-01-15 DIAGNOSIS — R5383 Other fatigue: Secondary | ICD-10-CM | POA: Diagnosis not present

## 2017-01-15 LAB — CBC WITH DIFFERENTIAL/PLATELET
BASO%: 0.4 % (ref 0.0–2.0)
Basophils Absolute: 0 10*3/uL (ref 0.0–0.1)
EOS%: 3.1 % (ref 0.0–7.0)
Eosinophils Absolute: 0.2 10*3/uL (ref 0.0–0.5)
HCT: 25.2 % — ABNORMAL LOW (ref 38.4–49.9)
HGB: 7.8 g/dL — ABNORMAL LOW (ref 13.0–17.1)
LYMPH%: 14.9 % (ref 14.0–49.0)
MCH: 25.2 pg — ABNORMAL LOW (ref 27.2–33.4)
MCHC: 31 g/dL — AB (ref 32.0–36.0)
MCV: 81.6 fL (ref 79.3–98.0)
MONO#: 0.5 10*3/uL (ref 0.1–0.9)
MONO%: 7.3 % (ref 0.0–14.0)
NEUT#: 5.3 10*3/uL (ref 1.5–6.5)
NEUT%: 74.3 % (ref 39.0–75.0)
PLATELETS: 275 10*3/uL (ref 140–400)
RBC: 3.09 10*6/uL — AB (ref 4.20–5.82)
RDW: 21.8 % — ABNORMAL HIGH (ref 11.0–14.6)
WBC: 7.2 10*3/uL (ref 4.0–10.3)
lymph#: 1.1 10*3/uL (ref 0.9–3.3)

## 2017-01-15 LAB — COMPREHENSIVE METABOLIC PANEL
ALT: 9 U/L (ref 0–55)
ANION GAP: 10 meq/L (ref 3–11)
AST: 11 U/L (ref 5–34)
Albumin: 3.4 g/dL — ABNORMAL LOW (ref 3.5–5.0)
Alkaline Phosphatase: 96 U/L (ref 40–150)
BUN: 16 mg/dL (ref 7.0–26.0)
CHLORIDE: 107 meq/L (ref 98–109)
CO2: 24 meq/L (ref 22–29)
CREATININE: 1.2 mg/dL (ref 0.7–1.3)
Calcium: 9.8 mg/dL (ref 8.4–10.4)
Glucose: 115 mg/dl (ref 70–140)
Potassium: 4.7 mEq/L (ref 3.5–5.1)
Sodium: 141 mEq/L (ref 136–145)
Total Bilirubin: 0.42 mg/dL (ref 0.20–1.20)
Total Protein: 7.1 g/dL (ref 6.4–8.3)

## 2017-01-15 LAB — PREPARE RBC (CROSSMATCH)

## 2017-01-15 MED ORDER — PAZOPANIB HCL 200 MG PO TABS: 800.0000 mg | ORAL_TABLET | Freq: Every day | ORAL | 2 refills | Status: DC

## 2017-01-15 NOTE — Progress Notes (Signed)
[facility] OFFICE PROGRESS NOTE  Via, [name], MD [place] [address]  DIAGNOSIS: [name]. The patient presented with an 11 cm left kidney mass and pulmonary nodules.  CURRENT THERAPY: The patient is to begin Votrient 800 mg daily in the next one to 2 weeks.  INTERVAL HISTORY: Trevor [name] 71 y.o. male returns for routine follow-up by himself. The patient is feeling fine except for hematuria and some difficulty voiding since his kidney biopsy last week. He reports that the blood in his urine is slowly clearing up. He does have some difficulty getting his stream started. He is scheduled to follow up with urology next week. The patient denies fevers and chills. Denies chest pain, shortness of breath, cough, hemoptysis. Denies nausea, vomiting, constipation, diarrhea. The patient is here for repeat blood work and to review his biopsy results.  MEDICAL HISTORY: Past Medical History:  Diagnosis Date  . Anemia   . Coronary artery disease    06/2012: Anterior ST elevation myocardial infarction. Cardiac catheterization showed significant three-vessel coronary artery disease, ejection fraction 35-40%. The patient underwent CABG with LIMA to LAD, SVG to ramus and sequential SVG to right PDA/PL  . Diabetes mellitus without complication ([place])   . Hyperlipidemia   . Ischemic cardiomyopathy    Ejection fraction of 35-40% initially, 45 - 50% on echo 2014, 53% by perfusion study 2016  . Stroke ([place])   . TIA (transient ischemic attack) 10/2016    ALLERGIES:  has No Known Allergies.  MEDICATIONS:  Current Outpatient Prescriptions  Medication Sig Dispense Refill  . aspirin EC 81 MG tablet Take 81 mg by mouth daily.    Marland Kitchen atorvastatin (LIPITOR) 40 MG tablet Take 1 tablet (40 mg total) by mouth daily. 30 tablet 6  . carvedilol (COREG) 3.125 MG tablet Take 1 tablet (3.125 mg total) by mouth 2 (two) times daily. 180 tablet 2  . ferrous  sulfate 325 (65 FE) MG tablet Take 325 mg by mouth daily with breakfast.    . lisinopril (PRINIVIL,ZESTRIL) 2.5 MG tablet Take 2 tablets (5 mg total) by mouth daily. 60 tablet 0  . metFORMIN (GLUCOPHAGE) 1000 MG tablet Take 1,000 mg by mouth 2 (two) times daily.   0  . pantoprazole (PROTONIX) 40 MG tablet Take 1 tablet (40 mg total) by mouth at bedtime. 30 tablet 0  . pazopanib (VOTRIENT) 200 MG tablet Take 4 tablets (800 mg total) by mouth daily. Take on an empty stomach. 120 tablet 2   No current facility-administered medications for this visit.     SURGICAL HISTORY:  Past Surgical History:  Procedure Laterality Date  . CORONARY ARTERY BYPASS GRAFT N/A 07/05/2012   Procedure: CORONARY ARTERY BYPASS GRAFTING (CABG);  Surgeon: Ivin Poot, MD;  Location: [facility];  Service: Open Heart Surgery;  Laterality: N/A;  . INTRAOPERATIVE TRANSESOPHAGEAL ECHOCARDIOGRAM N/A 07/05/2012   Procedure: INTRAOPERATIVE TRANSESOPHAGEAL ECHOCARDIOGRAM;  Surgeon: Ivin Poot, MD;  Location: [facility];  Service: Open Heart Surgery;  Laterality: N/A;  . LEFT HEART CATH AND CORS/GRAFTS ANGIOGRAPHY N/A 11/14/2016   Procedure: LEFT HEART CATH AND CORS/GRAFTS ANGIOGRAPHY;  Surgeon: Belva Crome, MD;  Location: [facility];  Service: Cardiovascular;  Laterality: N/A;  . LEFT HEART CATHETERIZATION WITH CORONARY ANGIOGRAM N/A 07/04/2012   Procedure: LEFT HEART CATHETERIZATION WITH CORONARY ANGIOGRAM;  Surgeon: Wellington Hampshire, MD;  Location: [facility];  Service: Cardiovascular;  Laterality: N/A;    REVIEW OF  SYSTEMS:   Review of Systems  Constitutional: Negative for appetite change, chills, fatigue, fever and unexpected weight change.  HENT:   Negative for mouth sores, nosebleeds, sore throat and trouble swallowing.   Eyes: Negative for eye problems and icterus.  Respiratory: Negative for cough, hemoptysis, shortness of breath and wheezing.   Cardiovascular: Negative for chest pain and leg swelling.   Gastrointestinal: Negative for abdominal pain, constipation, diarrhea, nausea and vomiting.  Genitourinary: Negative for bladder incontinence, dysuria, frequency.   Positive for hematuria. Musculoskeletal: Negative for back pain, gait problem, neck pain and neck stiffness.  Skin: Negative for itching and rash.  Neurological: Negative for dizziness, extremity weakness, gait problem, headaches, light-headedness and seizures.  Hematological: Negative for adenopathy. Does not bruise/bleed easily.  Psychiatric/Behavioral: Negative for confusion, depression and sleep disturbance. The patient is not nervous/anxious.     PHYSICAL EXAMINATION:  Blood pressure 118/71, pulse (!) 102, temperature 98 F (36.7 C), temperature source Oral, resp. rate 18, height 5\' 11"  (1.803 m), weight 167 lb 6.4 oz (75.9 kg), SpO2 100 %.  ECOG PERFORMANCE STATUS: 1 - Symptomatic but completely ambulatory  Physical Exam  Constitutional: Oriented to person, place, and time and well-developed, well-nourished, and in no distress. No distress.  HENT:  Head: Normocephalic and atraumatic.  Mouth/Throat: Oropharynx is clear and moist. No oropharyngeal exudate.  Eyes: Conjunctivae are normal. Right eye exhibits no discharge. Left eye exhibits no discharge. No scleral icterus.  Neck: Normal range of motion. Neck supple.  Cardiovascular: Normal rate, regular rhythm, normal heart sounds and intact distal pulses.   Pulmonary/Chest: Effort normal and breath sounds normal. No respiratory distress. No wheezes. No rales.  Abdominal: Soft. Bowel sounds are normal. Exhibits no distension and no mass. There is no tenderness.  Musculoskeletal: Normal range of motion. Exhibits no edema.  Lymphadenopathy:    No cervical adenopathy.  Neurological: Alert and oriented to person, place, and time. Exhibits normal muscle tone. Gait normal. Coordination normal.  Skin: Skin is warm and dry. No rash noted. Not diaphoretic. No erythema. No pallor.   Psychiatric: Mood, memory and judgment normal.  Vitals reviewed.  LABORATORY DATA: Lab Results  Component Value Date   WBC 7.2 01/15/2017   HGB 7.8 (L) 01/15/2017   HCT 25.2 (L) 01/15/2017   MCV 81.6 01/15/2017   PLT 275 01/15/2017      Chemistry      Component Value Date/Time   NA 141 01/15/2017 1032   K 4.7 01/15/2017 1032   CL 101 01/08/2017 1100   CO2 24 01/15/2017 1032   BUN 16.0 01/15/2017 1032   CREATININE 1.2 01/15/2017 1032      Component Value Date/Time   CALCIUM 9.8 01/15/2017 1032   ALKPHOS 96 01/15/2017 1032   AST 11 01/15/2017 1032   ALT 9 01/15/2017 1032   BILITOT 0.42 01/15/2017 1032       RADIOGRAPHIC STUDIES:  Nm Pet Image Initial (pi) Skull Base To Thigh  Result Date: 12/27/2016 CLINICAL DATA:  Initial treatment strategy for left renal cell carcinoma. Pulmonary nodules on CT. EXAM: NUCLEAR MEDICINE PET SKULL BASE TO THIGH TECHNIQUE: 8.29 mCi F-18 FDG was injected intravenously. Full-ring PET imaging was performed from the skull base to thigh after the radiotracer. CT data was obtained and used for attenuation correction and anatomic localization. FASTING BLOOD GLUCOSE:  Value: 112 mg/dl COMPARISON:  CTs 11/12/2016.  MRI 11/16/2016. FINDINGS: NECK No hypermetabolic cervical lymph nodes are identified.There are no lesions of the pharyngeal mucosal space. Activity within the  orbits appears physiologic. CHEST There are no hypermetabolic mediastinal, hilar or axillary lymph nodes. Again demonstrated are multiple, predominately right-sided pulmonary nodules consistent with metastatic disease. The largest of these nodules are hypermetabolic, including right upper lobe lesions measuring 12 mm on image 22 (SUV max 5.1) and 13 mm on image 28 (SUV max 3.8). Some of the smaller nodules are too small to characterize by PET. There is aortic, great vessel and coronary artery atherosclerosis post CABG. Previously noted pleural effusions have resolved. ABDOMEN/PELVIS There  is no hypermetabolic activity within the liver, adrenal glands, spleen or pancreas. The large heterogeneous left renal mass demonstrates peripheral hypermetabolic activity consistent with renal cell carcinoma. SUV max is 10.1. There are no hypermetabolic lymph nodes within the abdomen or pelvis. There is diffuse aortic and branch vessel atherosclerosis. Mild enlargement of the prostate gland without suspicious metabolic activity. SKELETON There is no hypermetabolic activity to suggest osseous metastatic disease. IMPRESSION: 1. The large left renal cell carcinoma and its pulmonary metastases are hypermetabolic. These metastases are larger and more numerous on the right, not significantly changed from previous chest CT. 2. No other evidence of metastatic disease within the neck, chest, abdomen or pelvis. 3.  Aortic Atherosclerosis (ICD10-I70.0). Electronically Signed   By: Richardean Sale M.D.   On: 12/27/2016 11:35   US Biopsy (kidney)  Result Date: 01/08/2017 INDICATION: History of presumed metastatic renal cell carcinoma. Please perform ultrasound-guided left renal mass biopsy for tissue diagnostic purposes. EXAM: ULTRASOUND GUIDED RENAL LESION BIOPSY COMPARISON:  PET-CT - 12/27/2016 MEDICATIONS: None. ANESTHESIA/SEDATION: Fentanyl 100 mcg IV; Versed 2 mg IV Total Moderate Sedation time: 14 minutes; The patient was continuously monitored during the procedure by the interventional radiology nurse under my direct supervision. COMPLICATIONS: None immediate. PROCEDURE: Informed written consent was obtained from the patient after a discussion of the risks, benefits and alternatives to treatment. The patient understands and consents the procedure. A timeout was performed prior to the initiation of the procedure. Ultrasound scanning was performed of the left flank demonstrating an enlarged (at least 10.7 x 7.8 cm) mass arising from the superior pole of the left kidney (image 2). The procedure was planned. The  operative site was prepped and draped in the usual sterile fashion. The overlying soft tissues were anesthetized with 1% lidocaine with epinephrine. A 17 gauge core needle biopsy device was advanced into the inferior aspect of the exophytic left renal mass. Under direct ultrasound guidance, 5 core needle biopsies were obtained with the use of an 18 gauge core needle biopsy device. Multiple ultrasound images were saved for procedural documentation purposes. The coaxial needle was removed following the administration of a Gel-Foam slurry. Post procedural scanning was negative for significant post procedural hemorrhage or additional complication. A dressing was placed. The patient tolerated the procedure well without immediate post procedural complication. IMPRESSION: Technically successful ultrasound guided biopsy of left renal lesion/mass. Electronically Signed   By: Sandi Mariscal M.D.   On: 01/08/2017 15:07     ASSESSMENT/PLAN:  Renal cell carcinoma [facility]) This is a pleasant 71 year old gentleman with multiple medical problems including history of coronary artery disease and chronic anemia who has a large left renal mass and multiple bilateral pulmonary nodules suspicious for metastatic renal cell carcinoma.  The patient was seen with Dr. Julien Nordmann. Biopsy results were discussed with the patient. Explained that he does have renal cell carcinoma. Recommend that he undergo treatment. Treatment options were discussed with the patient today including immune therapy consisting of a Ipilimumab and Nivolumab every  3 weeks versus Votrient. We will review this with one of our other oncologists to determine what they would recommend.   Adverse effects of  immune therapy were discussed including but not limited to immune mediated the skin rash, diarrhea, inflammation of the lung, kidney, liver, thyroid or other endocrine dysfunction. Adverse effects of Votrient were discussed including but not limited to skin rash,  diarrhea, hypertension. The patient will need a chemotherapy education class once we determine which treatment to proceed with.  The patient was seen with Dr. Julien Nordmann. PET scan results were discussed with the patient. He was shown the images. Discussed that he has hypermetabolic areas in the left kidney and pulmonary nodules. Recommend he proceed with a biopsy of one of these areas.   Recommend that he keep his follow-up with urology. May consider nephrectomy given that he is having symptoms including hematuria.   Follow-up in this office will be made once we determine the treatment plan after consultation.    Anemia The patient's hemoglobin today is down to 7.8. Likely due to hematuria. The patient will be set up to receive 2 units of packed red blood cells later this week.   All questions were answered. The patient knows to call the clinic with any problems, questions or concerns. We can certainly see the patient much sooner if necessary.   ADDENDUM: Case was reviewed with Dr. Alen Blew. He recommends proceeding with oral therapy with Votrient 800 mg daily. A prescription was sent to the [facility] outpatient pharmacy. I have contacted our oral chemotherapy navigator to assist [place] with insurance approval. A call was placed to the patient to inform him of the above recommendation. Message sent to scheduling to get the patient scheduled for a chemotherapy education class and for follow-up in approximately 2 weeks after the start of his medication.   Orders Placed This Encounter  Procedures  . Practitioner attestation of consent    I, the ordering practitioner, attest that I have discussed with the patient the benefits, risks, side effects, alternatives, likelihood of achieving goals and potential problems during recovery for the procedure listed.    Standing Status:   Future    Standing Expiration Date:   01/15/2018    Order Specific Question:   Procedure    Answer:   Blood Product(s)  .  Complete patient signature process for consent form    Standing Status:   Future    Standing Expiration Date:   01/15/2018  . Care order/instruction    Transfuse Parameters    Standing Status:   Future    Standing Expiration Date:   01/15/2018  . Hold Tube, Blood Bank    Standing Status:   Future    Number of Occurrences:   1    Standing Expiration Date:   01/15/2018  . Type and screen    Standing Status:   Future    Number of Occurrences:   1    Standing Expiration Date:   01/15/2018   Mikey Bussing, DNP, AGPCNP-BC, AOCNP 01/15/17  ADDENDUM: Hematology/Oncology Attending: I had a face to face encounter with the patient today. I recommended his care plan. This is a very pleasant 5 his old white male recently diagnosed with metastatic renal cell carcinoma, chromophobe type presented with large left renal mass in addition to bilateral pulmonary nodules. The patient recently underwent ultrasound guided core biopsy of left renal mass and the final pathology ([id number]) was consistent with chromophobe renal cell carcinoma. The patient is here today  for evaluation and discussion of his treatment options. He continues to complain of increasing fatigue and weakness. I had a lengthy discussion with the patient today about his treatment options. I gave him the option of treatment with palliative care versus consideration of treatment with oral Votrient 800 mg by mouth daily. I discussed with the patient the adverse effect of this treatment. He will also receive education about the oral drug from the oral pharmacist. We will see him back for follow-up visit in 2 weeks for evaluation and repeat blood work. He is also scheduled to see Dr. Tammi Klippel for evaluation and consideration of left nephrectomy. The patient was advised to call immediately if he has any concerning symptoms in the interval. Disclaimer: This note was dictated with voice recognition software. Similar sounding words can  inadvertently be transcribed and may be missed upon review. Eilleen Kempf, MD 01/15/17

## 2017-01-15 NOTE — Assessment & Plan Note (Signed)
The patient's hemoglobin today is down to 7.8. Likely due to hematuria. The patient will be set up to receive 2 units of packed red blood cells later this week.

## 2017-01-15 NOTE — Assessment & Plan Note (Signed)
This is a pleasant 71 year old gentleman with multiple medical problems including history of coronary artery disease and chronic anemia who has a large left renal mass and multiple bilateral pulmonary nodules suspicious for metastatic renal cell carcinoma.  The patient was seen with Dr. Julien Nordmann. Biopsy results were discussed with the patient. Explained that he does have renal cell carcinoma. Recommend that he undergo treatment. Treatment options were discussed with the patient today including immune therapy consisting of a Ipilimumab and Nivolumab every 3 weeks versus Votrient. We will review this with one of our other oncologists to determine what they would recommend.   Adverse effects of immune therapy were discussed including but not limited to immune mediated the skin rash, diarrhea, inflammation of the lung, kidney, liver, thyroid or other endocrine dysfunction. Adverse effects of Votrient were discussed including but not limited to skin rash, diarrhea, hypertension. The patient will need a chemotherapy education class once we determine which treatment to proceed with.  The patient was seen with Dr. Julien Nordmann. PET scan results were discussed with the patient. He was shown the images. Discussed that he has hypermetabolic areas in the left kidney and pulmonary nodules. Recommend he proceed with a biopsy of one of these areas.   Recommend that he keep his follow-up with urology. May consider nephrectomy given that he is having symptoms including hematuria.   Follow-up in this office will be made once we determine the treatment plan after consultation.

## 2017-01-15 NOTE — Telephone Encounter (Signed)
Printed avs and calender for upcoming appointment. Per 10/29 los

## 2017-01-16 ENCOUNTER — Ambulatory Visit (HOSPITAL_BASED_OUTPATIENT_CLINIC_OR_DEPARTMENT_OTHER): Payer: Medicare Other

## 2017-01-16 ENCOUNTER — Other Ambulatory Visit: Payer: Self-pay | Admitting: Oncology

## 2017-01-16 ENCOUNTER — Telehealth: Payer: Self-pay | Admitting: Pharmacist

## 2017-01-16 DIAGNOSIS — D500 Iron deficiency anemia secondary to blood loss (chronic): Secondary | ICD-10-CM

## 2017-01-16 DIAGNOSIS — D649 Anemia, unspecified: Secondary | ICD-10-CM

## 2017-01-16 DIAGNOSIS — C642 Malignant neoplasm of left kidney, except renal pelvis: Secondary | ICD-10-CM

## 2017-01-16 MED ORDER — ACETAMINOPHEN 325 MG PO TABS
650.0000 mg | ORAL_TABLET | Freq: Once | ORAL | Status: AC
  Administered 2017-01-16: 650 mg via ORAL

## 2017-01-16 MED ORDER — DIPHENHYDRAMINE HCL 25 MG PO CAPS
25.0000 mg | ORAL_CAPSULE | Freq: Once | ORAL | Status: AC
  Administered 2017-01-16: 25 mg via ORAL

## 2017-01-16 MED ORDER — SODIUM CHLORIDE 0.9 % IV SOLN
250.0000 mL | Freq: Once | INTRAVENOUS | Status: AC
  Administered 2017-01-16: 250 mL via INTRAVENOUS

## 2017-01-16 MED ORDER — ACETAMINOPHEN 325 MG PO TABS
ORAL_TABLET | ORAL | Status: AC
  Filled 2017-01-16: qty 2

## 2017-01-16 MED ORDER — DIPHENHYDRAMINE HCL 25 MG PO CAPS
ORAL_CAPSULE | ORAL | Status: AC
  Filled 2017-01-16: qty 1

## 2017-01-16 MED ORDER — CABOZANTINIB S-MALATE 60 MG PO TABS: 60.0000 mg | ORAL_TABLET | Freq: Every day | ORAL | 2 refills | Status: DC

## 2017-01-16 NOTE — Telephone Encounter (Signed)
Oral Oncology Pharmacist Encounter  Received new prescription for Votrient (pazopanib) for the treatment of metastatic renal cell carcinoma, planned duration until disease progression or unacceptable toxicity.  Labs from 01/15/17 assessed, OK for treatment, patient receiving PRBC transfusion for anemia. BPs in Epic reviewed, WNL to hypotensive 11/20/16 urine protein negative No recent TSH  Current medication list in Epic reviewed, DDIs with Votrient identified:  Votrient and Coreg: category X interaction due to P-gp inhibition of Coreg and increased systemic exposure to Votrient  Votrient and Lipitor: category X interaction due to possible increased hepatotoxic effects of Votrient with the use of a statin  Votrient and Protonix: category X interaction due to significantly decreased absorption of Votrient with increasing gastric pH Drug interactions discussed with MD, will change therapy to Cabometyx  No drug interactions with Cabometyx and medication list identified  Prescription has been e-scribed to the [facility] for benefits analysis and approval. Prior authorization will be submitted.   I spoke with patient in infusion room for overview of new oral chemotherapy medication: Cabometyx (cabozantinib) for the treatment of metastatic renal cell carcinoma, planned duration until disease progression or unacceptable toxicity.  Pt is doing well. Counseled patient on administration, dosing, side effects, monitoring, drug-food interactions, safe handling, storage, and disposal.  Patient will take Cabometyx 60mg  tablets, 1 tablet (60mg ) by mouth once daily on an empty stomach, 1 hour before or 2 hours after a meal. Patient knows to avoid grapefruit and grapefruit juice. Cabometyx start date: pending medication acquisition (likely by 11/2)  Side effects include but not limited to: diarrhea, nausea, decreased appetite, fatigue, hypertension, hand-foot syndrome, decreased  blood counts, and electrolyte abnormalities. Patient will get some loperamide for home and will call the office if diarrhea develops.   Patient will alert the office prior to any planned invasive procedures  Reviewed with patient importance of keeping a medication schedule and plan for any missed doses.  Trevor [name] voiced understanding and appreciation.   All questions answered. Medication reconciliation performed and medication/allergy list updated.  Will follow up with patient regarding insurance and pharmacy.   Patient knows to call the office with questions or concerns.   Oral Oncology Clinic will continue to follow for insurance authorization, copayment issues, and start date.  Trevor [name], PharmD, BCPS, BCOP 01/16/2017 2:22 PM Oral Oncology Clinic [phone]

## 2017-01-16 NOTE — Patient Instructions (Signed)

## 2017-01-17 ENCOUNTER — Telehealth: Payer: Self-pay | Admitting: Pharmacy Technician

## 2017-01-17 ENCOUNTER — Telehealth: Payer: Self-pay | Admitting: Pharmacist

## 2017-01-17 LAB — TYPE AND SCREEN
ABO/RH(D): O POS
Antibody Screen: NEGATIVE
UNIT DIVISION: 0
UNIT DIVISION: 0

## 2017-01-17 LAB — BPAM RBC
Blood Product Expiration Date: 201811282359
Blood Product Expiration Date: 201811282359
ISSUE DATE / TIME: 201810301306
ISSUE DATE / TIME: 201810301306
UNIT TYPE AND RH: 5100
Unit Type and Rh: 5100

## 2017-01-17 MED FILL — CABOMETYX 60 MG TABLET: 60 | 30 days supply | Qty: 30 | Fill #0

## 2017-01-17 NOTE — Telephone Encounter (Signed)
Oral Chemotherapy Pharmacist Encounter  Successfully enrolled patient for copayment assistance funds from [facility] [place] [facility]) from the Renal Cell Carcinoma fund Award amount: $[id number] Effective dates: 01/17/17 - 01/17/18 ID: [id number] BIN: [id number] Group: [id number] PCN: [id number]  Information shared with dispensing pharmacy.  Johny Drilling, PharmD, BCPS, BCOP 01/17/2017 2:48 PM Oral Oncology Clinic [phone]

## 2017-01-17 NOTE — Telephone Encounter (Signed)
Oral Oncology Patient Advocate Encounter  Received notification from [place] that prior authorization for Cabometyx is required.  PA submitted on CoverMyMeds [facility] Status is pending  Oral Oncology Clinic will continue to follow.   Fabio Asa. [name], [place] [facility] [place] [phone] 01/17/2017 9:34 AM

## 2017-01-17 NOTE — Telephone Encounter (Signed)
Oral Oncology Pharmacist Encounter  Prior authorization for Cabometyx approved [id number] Effective dates: 01/17/17-03/19/18  Prescription processed at the [facility]. Copayment $3170.70, covered with PANF copayment assistance grant.  Patient will pick up his Cabometyx from the pharmacy tomorrow (11/1), and will wait until chemotherapy education class to start it. Mikey Bussing notified.  Oral Oncology Clinic will continue to follow.  Johny Drilling, PharmD, BCPS, BCOP 01/17/2017 2:42 PM Oral Oncology Clinic [phone]

## 2017-01-18 ENCOUNTER — Telehealth: Payer: Self-pay | Admitting: Internal Medicine

## 2017-01-18 ENCOUNTER — Encounter: Payer: Self-pay | Admitting: *Deleted

## 2017-01-18 DIAGNOSIS — C649 Malignant neoplasm of unspecified kidney, except renal pelvis: Secondary | ICD-10-CM | POA: Insufficient documentation

## 2017-01-18 NOTE — Telephone Encounter (Signed)
Spoke with patient regarding appts that was scheduled per 10/30 sch msg.

## 2017-01-19 ENCOUNTER — Other Ambulatory Visit: Payer: Medicare Other

## 2017-01-24 ENCOUNTER — Other Ambulatory Visit: Payer: Self-pay | Admitting: Urology

## 2017-01-26 ENCOUNTER — Telehealth: Payer: Self-pay | Admitting: *Deleted

## 2017-01-26 NOTE — Telephone Encounter (Signed)
Chart reviewed as part of pre-operative protocol coverage. The patient was cleared by Dr. Fletcher Anon on 12/26/16. Will route for ASA review.   Morgan Hill, PA  01/26/2017, 4:48 PM

## 2017-01-26 NOTE — Telephone Encounter (Signed)
   [facility] Pre-operative Risk Assessment    Request for surgical clearance:  1. What type of surgery is being performed? ROBOTIC LEFT RADIAL NEPHRECTOMY   2. When is this surgery scheduled? PREPOSED   3. Are there any medications that need to be held prior to surgery and how long?ASPIRIN FOR 5 DAYS   4. Practice name and name of physician performing surgery? Otho Bellows MD   5. What is your office phone and fax number? [phone]  FX=[phone]   6. Anesthesia type (None, local, MAC, general) ? UNKNOWN   Fredia Beets 01/26/2017, 7:57 AM  _________________________________________________________________   (provider comments below)

## 2017-01-28 NOTE — Telephone Encounter (Signed)
Aspirin 81 mg daily should be continued.

## 2017-01-29 NOTE — Telephone Encounter (Signed)
    Chart reviewed as part of pre-operative protocol coverage.  Per Dr. Tyrell Antonio most recent note, "He can proceed at an overall moderate risk. The best course of action might be to postpone any coronary revascularization until after nephrectomy, biopsies are done and treatment plan for presumed cancer are formulated." Dr. Fletcher Anon recommends that aspirin 81mg  daily be continued, not stopped. I called patient to discuss any new changes since last OV. He states his urologist actually had arranged instead for him to see Dr. Fletcher Anon again on 01/30/17. Spoke with Dr. Fletcher Anon; OK to keep appt. Pt aware.  Final pre-op recommendations will be forthcoming from that visit. Dr. Fletcher Anon, please forward your final note to Dr. Zettie Pho office ([phone] listed in original pre-op request). Thanks.   Charlie Pitter, PA-C  01/29/2017, 3:03 PM

## 2017-01-30 ENCOUNTER — Encounter: Payer: Self-pay | Admitting: Cardiovascular Disease

## 2017-01-30 ENCOUNTER — Ambulatory Visit: Payer: Medicare Other | Admitting: Cardiovascular Disease

## 2017-01-30 VITALS — BP 138/74 | HR 86 | Ht 71.0 in | Wt 166.2 lb

## 2017-01-30 DIAGNOSIS — Z01810 Encounter for preprocedural cardiovascular examination: Secondary | ICD-10-CM

## 2017-01-30 DIAGNOSIS — I255 Ischemic cardiomyopathy: Secondary | ICD-10-CM | POA: Diagnosis not present

## 2017-01-30 DIAGNOSIS — I779 Disorder of arteries and arterioles, unspecified: Secondary | ICD-10-CM

## 2017-01-30 DIAGNOSIS — I739 Peripheral vascular disease, unspecified: Secondary | ICD-10-CM | POA: Diagnosis not present

## 2017-01-30 DIAGNOSIS — I25709 Atherosclerosis of coronary artery bypass graft(s), unspecified, with unspecified angina pectoris: Secondary | ICD-10-CM | POA: Diagnosis not present

## 2017-01-30 DIAGNOSIS — E785 Hyperlipidemia, unspecified: Secondary | ICD-10-CM | POA: Diagnosis not present

## 2017-01-30 MED ORDER — LISINOPRIL 5 MG PO TABS: 5.0000 mg | ORAL_TABLET | Freq: Every day | ORAL | 3 refills | Status: DC

## 2017-01-30 NOTE — Progress Notes (Signed)
Cardiology Office Note   Date:  02/02/2017   ID:  Trevor [name], DOB [birth year], MRN [id number]  PCP:  [name], MD  Cardiologist:   [name], MD   No chief complaint on file.     History of Present Illness: Trevor [name] is a 71 y.o. male who presents for a followup visit regarding coronary artery disease status post  CABG,  ischemic cardiomyopathy And peripheral arterial disease. He presented in April of 2014 with anterior ST elevation myocardial infarction and was found to have significant three-vessel coronary artery disease and moderate left main stenosis. The patient underwent CABG at that time.  Echocardiogram in July of 2014 showed improved LV systolic function with an ejection fraction of 45-50% with apical hypokinesis. He underwent a nuclear stress test in December 2016 due to worsening exertional dyspnea. The test showed fixed inferior wall defect without significant ischemia. Ejection fraction was 53%. Overall it was a low risk study.  He is known to have peripheral arterial disease with moderately reduced ABI on the right due to severe SFA disease. He is being treated medically due to lack of claudication.  He was hospitalized in August with severe symptomatic anemia with hemoglobin of 5.6. He was also found to have mildly elevated troponin consistent with non-ST elevation myocardial infarction. The patient was transfused. He underwent cardiac catheterization which showed occluded SVG to RCA and SVG to OM. His LIMA to LAD was patent. Ejection fraction was 20-25%. No revascularization could be done with PCI given his symptomatic anemia and also the fact that he was found to have renal mass highly suggestive of cancer with pulmonary nodules. The patient was treated medically. He returned 2 days later with a stroke which was confirmed by MRI.  He was treated medically for coronary artery disease.  He had a repeat echocardiogram done in September which showed an EF of 45-50%  with mild mitral regurgitation.  Carotid Doppler showed moderate bilateral disease worse on the right side.  The patient needs to have nephrectomy done and this is scheduled in December.  He has not had any chest pain recently or worsening dyspnea.  He has been taking his medications regularly.  He feels stronger every day.  Past Medical History:  Diagnosis Date  . Anemia   . Coronary artery disease    06/2012: Anterior ST elevation myocardial infarction. Cardiac catheterization showed significant three-vessel coronary artery disease, ejection fraction 35-40%. The patient underwent CABG with LIMA to LAD, SVG to ramus and sequential SVG to right PDA/PL  . Diabetes mellitus without complication ([place])   . Hyperlipidemia   . Ischemic cardiomyopathy    Ejection fraction of 35-40% initially, 45 - 50% on echo 2014, 53% by perfusion study 2016  . Stroke ([place])   . TIA (transient ischemic attack) 10/2016    Past Surgical History:  Procedure Laterality Date  . CORONARY ARTERY BYPASS GRAFTING (CABG) N/A 07/05/2012   Performed by Ivin Poot, MD at [place]  . INTRAOPERATIVE TRANSESOPHAGEAL ECHOCARDIOGRAM N/A 07/05/2012   Performed by Ivin Poot, MD at [place]  . LEFT HEART CATH AND CORS/GRAFTS ANGIOGRAPHY N/A 11/14/2016   Performed by Belva Crome, MD at [place]  . LEFT HEART CATHETERIZATION WITH CORONARY ANGIOGRAM N/A 07/04/2012   Performed by Wellington Hampshire, MD at [facility]     Current Outpatient Medications  Medication Sig Dispense Refill  . aspirin EC 81 MG tablet Take 81 mg by mouth daily.    Marland Kitchen  atorvastatin (LIPITOR) 40 MG tablet Take 1 tablet (40 mg total) by mouth daily. 30 tablet 6  . cabozantinib S-Malate 60 MG TABS Take 60 mg by mouth daily. Take on an empty stomach 1 hour before or 2 hours after a meal. 30 tablet 2  . carvedilol (COREG) 3.125 MG tablet Take 1 tablet (3.125 mg total) by mouth 2 (two) times daily. 180 tablet 2  . ferrous sulfate 325 (65 FE) MG  tablet Take 325 mg by mouth daily with breakfast.    . lisinopril (PRINIVIL,ZESTRIL) 5 MG tablet Take 1 tablet (5 mg total) daily by mouth. 90 tablet 3  . metFORMIN (GLUCOPHAGE) 1000 MG tablet Take 1,000 mg by mouth 2 (two) times daily.   0  . pantoprazole (PROTONIX) 40 MG tablet Take 1 tablet (40 mg total) by mouth at bedtime. 30 tablet 0   No current facility-administered medications for this visit.     Allergies:   Patient has no known allergies.    Social History:  The patient  reports that he quit smoking about 4 years ago. He has a 50.00 pack-year smoking history. he has never used smokeless tobacco. He reports that he does not drink alcohol or use drugs.   Family History:  The patient's family history includes Cancer in his maternal grandfather and mother; Heart attack (age of onset: 45) in his brother; Stroke in his mother.    ROS:  Please see the history of present illness.   Otherwise, review of systems are positive for none.   All other systems are reviewed and negative.    PHYSICAL EXAM: VS:  BP 138/74 (BP Location: Left Arm, Patient Position: Sitting, Cuff Size: Normal)   Pulse 86   Ht 5\' 11"  (1.803 m)   Wt 166 lb 3.2 oz (75.4 kg)   BMI 23.18 kg/m  , BMI Body mass index is 23.18 kg/m. GEN: Well nourished, well developed, in no acute distress  HEENT: normal  Neck: no JVD, or masses. Bilateral carotid bruits Cardiac: RRR; no murmurs, rubs, or gallops,no edema  Respiratory:  clear to auscultation bilaterally, normal work of breathing GI: soft, nontender, nondistended, + BS MS: no deformity or atrophy  Skin: warm and dry, no rash Neuro:  Strength and sensation are intact Psych: euthymic mood, full affect   EKG:  EKG is  ordered today. EKG showed sinus rhythm with PVCs and nonspecific ST-T wave changes.   Recent Labs: 11/22/2016: Magnesium 1.7 01/15/2017: ALT 9; BUN 16.0; Creatinine 1.2; HGB 7.8; Platelets 275; Potassium 4.7; Sodium 141    Lipid Panel      Component Value Date/Time   CHOL 77 11/20/2016 0704   TRIG 64 11/20/2016 0704   HDL 30 (L) 11/20/2016 0704   CHOLHDL 2.6 11/20/2016 0704   VLDL 13 11/20/2016 0704   LDLCALC 34 11/20/2016 0704      Wt Readings from Last 3 Encounters:  01/30/17 166 lb 3.2 oz (75.4 kg)  01/15/17 167 lb 6.4 oz (75.9 kg)  01/09/17 169 lb (76.7 kg)         ASSESSMENT AND PLAN:  1.  preop cardiovascular evaluation for  nephrectomy. His myocardial infarction was more than 3 months ago and his ejection fraction improved to 45%. He can proceed at an overall moderate risk.  Continue same cardiac medications. I prefer that he stays on aspirin 81 mg daily without interruption given his extensive cardiovascular history and to minimize the risk of perioperative myocardial infarction.  If the risk of bleeding  is considered too high, this can be held for a few days.  2. Coronary artery disease: Status post CABG. Status post non-ST elevation myocardial infarction in August of this year in the setting of severe symptomatic anemia.  PCI of the right coronary artery with atherectomy can be considered in the future for refractory anginal symptoms.   3. Ischemic cardiomyopathy: Most recent ejection fraction was 45-50%. Continue treatment with carvedilol and lisinopril.  4. Peripheral arterial disease: The patient has evidence of severe right mid SFA stenosis . He denies claudication at this time. Continue medical therapy.   5. Hyperlipidemia: Continue treatment with atorvastatin with a target LDL of less than 70.  6. Moderate bilateral carotid disease.Continue medical therapy and repeat carotid Doppler in 1 year.   Disposition:   FU with me in 3 months  Signed,  [name], MD  02/02/2017 1:47 PM    [place]

## 2017-01-30 NOTE — Patient Instructions (Signed)
Medication Instructions: Your physician recommends that you continue on your current medications as directed. Please refer to the Current Medication list given to you today.  If you need a refill on your cardiac medications before your next appointment, please call your pharmacy.   Follow-Up: Your physician wants you to follow-up in 3 months with Dr. Arida.   Thank you for choosing [facility]!!      

## 2017-02-05 ENCOUNTER — Telehealth: Payer: Self-pay | Admitting: Oncology

## 2017-02-05 ENCOUNTER — Ambulatory Visit (HOSPITAL_BASED_OUTPATIENT_CLINIC_OR_DEPARTMENT_OTHER): Payer: Medicare Other | Admitting: Oncology

## 2017-02-05 ENCOUNTER — Encounter: Payer: Self-pay | Admitting: Oncology

## 2017-02-05 ENCOUNTER — Other Ambulatory Visit (HOSPITAL_BASED_OUTPATIENT_CLINIC_OR_DEPARTMENT_OTHER): Payer: Medicare Other

## 2017-02-05 VITALS — BP 115/66 | HR 95 | Temp 97.7°F | Resp 18 | Ht 71.0 in | Wt 162.9 lb

## 2017-02-05 DIAGNOSIS — Z23 Encounter for immunization: Secondary | ICD-10-CM

## 2017-02-05 DIAGNOSIS — C642 Malignant neoplasm of left kidney, except renal pelvis: Secondary | ICD-10-CM | POA: Diagnosis not present

## 2017-02-05 DIAGNOSIS — Z5111 Encounter for antineoplastic chemotherapy: Secondary | ICD-10-CM | POA: Insufficient documentation

## 2017-02-05 DIAGNOSIS — R911 Solitary pulmonary nodule: Secondary | ICD-10-CM | POA: Diagnosis not present

## 2017-02-05 LAB — CBC WITH DIFFERENTIAL/PLATELET
BASO%: 0.2 % (ref 0.0–2.0)
BASOS ABS: 0 10*3/uL (ref 0.0–0.1)
EOS ABS: 0.3 10*3/uL (ref 0.0–0.5)
EOS%: 3.3 % (ref 0.0–7.0)
HEMATOCRIT: 38.1 % — AB (ref 38.4–49.9)
HGB: 12 g/dL — ABNORMAL LOW (ref 13.0–17.1)
LYMPH#: 1.5 10*3/uL (ref 0.9–3.3)
LYMPH%: 14.4 % (ref 14.0–49.0)
MCH: 25.5 pg — ABNORMAL LOW (ref 27.2–33.4)
MCHC: 31.5 g/dL — ABNORMAL LOW (ref 32.0–36.0)
MCV: 81.1 fL (ref 79.3–98.0)
MONO#: 0.5 10*3/uL (ref 0.1–0.9)
MONO%: 4.8 % (ref 0.0–14.0)
NEUT#: 7.8 10*3/uL — ABNORMAL HIGH (ref 1.5–6.5)
NEUT%: 77.3 % — AB (ref 39.0–75.0)
PLATELETS: 287 10*3/uL (ref 140–400)
RBC: 4.7 10*6/uL (ref 4.20–5.82)
RDW: 18.6 % — ABNORMAL HIGH (ref 11.0–14.6)
WBC: 10.1 10*3/uL (ref 4.0–10.3)

## 2017-02-05 LAB — COMPREHENSIVE METABOLIC PANEL
ALT: 35 U/L (ref 0–55)
AST: 30 U/L (ref 5–34)
Albumin: 3 g/dL — ABNORMAL LOW (ref 3.5–5.0)
Alkaline Phosphatase: 125 U/L (ref 40–150)
Anion Gap: 10 mEq/L (ref 3–11)
BUN: 20.4 mg/dL (ref 7.0–26.0)
CHLORIDE: 105 meq/L (ref 98–109)
CO2: 24 meq/L (ref 22–29)
Calcium: 9.4 mg/dL (ref 8.4–10.4)
Creatinine: 1.3 mg/dL (ref 0.7–1.3)
EGFR: 58 mL/min/{1.73_m2} — AB (ref >60)
GLUCOSE: 104 mg/dL (ref 70–140)
POTASSIUM: 4.7 meq/L (ref 3.5–5.1)
SODIUM: 139 meq/L (ref 136–145)
Total Bilirubin: 0.5 mg/dL (ref 0.20–1.20)
Total Protein: 7.3 g/dL (ref 6.4–8.3)

## 2017-02-05 MED ORDER — INFLUENZA VAC SPLIT HIGH-DOSE 0.5 ML IM SUSY
0.5000 mL | PREFILLED_SYRINGE | INTRAMUSCULAR | Status: AC
  Administered 2017-02-05: 0.5 mL via INTRAMUSCULAR
  Filled 2017-02-05: qty 0.5

## 2017-02-05 NOTE — Progress Notes (Signed)
[place] OFFICE PROGRESS NOTE  Via, [name], MD [place] [address]  DIAGNOSIS: [name]. The patient presented with an 11 cm left kidney mass and pulmonary nodules.  PRIOR THERAPY: None  CURRENT THERAPY: Cabometyx 60 mg daily. Started 01/21/2017..  INTERVAL HISTORY: Trevor [name] 71 y.o. male returns for routine follow-up visit by himself.  The patient started oral chemotherapy about 2 weeks ago and is so far tolerating it well.  He had diarrhea for the first time yesterday.  He took Imodium which was effective.  Patient denies fevers and chills.  Denies chest pain, shortness of breath, hemoptysis.  He has an ongoing nonproductive cough.  Denies nausea, vomiting, constipation.  Denies bleeding.  The patient was evaluated by urology recently and plan is for him to undergo a left nephrectomy in about 2 weeks.  He has been instructed to hold his Cabometyx for 7 days prior to his surgery and will likely be off treatment for up to 2 weeks following his surgery.  The patient is here for repeat evaluation and blood work.  MEDICAL HISTORY: Past Medical History:  Diagnosis Date  . Anemia   . Coronary artery disease    06/2012: Anterior ST elevation myocardial infarction. Cardiac catheterization showed significant three-vessel coronary artery disease, ejection fraction 35-40%. The patient underwent CABG with LIMA to LAD, SVG to ramus and sequential SVG to right PDA/PL  . Diabetes mellitus without complication ([place])   . Hyperlipidemia   . Ischemic cardiomyopathy    Ejection fraction of 35-40% initially, 45 - 50% on echo 2014, 53% by perfusion study 2016  . Stroke ([place])   . TIA (transient ischemic attack) 10/2016    ALLERGIES:  has No Known Allergies.  MEDICATIONS:  Current Outpatient Medications  Medication Sig Dispense Refill  . aspirin EC 81 MG tablet Take 81 mg by mouth daily.    Marland Kitchen atorvastatin (LIPITOR) 40 MG tablet Take 1  tablet (40 mg total) by mouth daily. 30 tablet 6  . cabozantinib S-Malate 60 MG TABS Take 60 mg by mouth daily. Take on an empty stomach 1 hour before or 2 hours after a meal. 30 tablet 2  . carvedilol (COREG) 3.125 MG tablet Take 1 tablet (3.125 mg total) by mouth 2 (two) times daily. 180 tablet 2  . ferrous sulfate 325 (65 FE) MG tablet Take 325 mg by mouth daily with breakfast.    . lisinopril (PRINIVIL,ZESTRIL) 5 MG tablet Take 1 tablet (5 mg total) daily by mouth. 90 tablet 3  . metFORMIN (GLUCOPHAGE) 1000 MG tablet Take 1,000 mg by mouth 2 (two) times daily.   0  . pantoprazole (PROTONIX) 40 MG tablet Take 1 tablet (40 mg total) by mouth at bedtime. 30 tablet 0   No current facility-administered medications for this visit.     SURGICAL HISTORY:  Past Surgical History:  Procedure Laterality Date  . CORONARY ARTERY BYPASS GRAFTING (CABG) N/A 07/05/2012   Performed by Ivin Poot, MD at [place]  . INTRAOPERATIVE TRANSESOPHAGEAL ECHOCARDIOGRAM N/A 07/05/2012   Performed by Ivin Poot, MD at [place]  . LEFT HEART CATH AND CORS/GRAFTS ANGIOGRAPHY N/A 11/14/2016   Performed by Belva Crome, MD at [place] [facility]  . LEFT HEART CATHETERIZATION WITH CORONARY ANGIOGRAM N/A 07/04/2012   Performed by Wellington Hampshire, MD at [facility]    REVIEW OF SYSTEMS:   Review of Systems  Constitutional: Negative for appetite  change, chills, fatigue, fever and unexpected weight change.  HENT:   Negative for mouth sores, nosebleeds, sore throat and trouble swallowing.   Eyes: Negative for eye problems and icterus.  Respiratory: Negative for hemoptysis, shortness of breath and wheezing.  Positive for nonproductive cough. Cardiovascular: Negative for chest pain and leg swelling.  Gastrointestinal: Negative for abdominal pain, constipation, nausea and vomiting. Positive for diarrhea on one occasion which resolved with Imodium. Genitourinary: Negative for bladder incontinence, difficulty urinating,  dysuria, frequency and hematuria.   Musculoskeletal: Negative for back pain, gait problem, neck pain and neck stiffness.  Skin: Negative for itching and rash.  Neurological: Negative for dizziness, extremity weakness, gait problem, headaches, light-headedness and seizures.  Hematological: Negative for adenopathy. Does not bruise/bleed easily.  Psychiatric/Behavioral: Negative for confusion, depression and sleep disturbance. The patient is not nervous/anxious.     PHYSICAL EXAMINATION:  Blood pressure 115/66, pulse 95, temperature 97.7 F (36.5 C), temperature source Oral, resp. rate 18, height 5\' 11"  (1.803 m), weight 162 lb 14.4 oz (73.9 kg), SpO2 100 %.  ECOG PERFORMANCE STATUS: 1 - Symptomatic but completely ambulatory  Physical Exam  Constitutional: Oriented to person, place, and time and well-developed, well-nourished, and in no distress. No distress.  HENT:  Head: Normocephalic and atraumatic.  Mouth/Throat: Oropharynx is clear and moist. No oropharyngeal exudate.  Eyes: Conjunctivae are normal. Right eye exhibits no discharge. Left eye exhibits no discharge. No scleral icterus.  Neck: Normal range of motion. Neck supple.  Cardiovascular: Normal rate, regular rhythm, normal heart sounds and intact distal pulses.   Pulmonary/Chest: Effort normal and breath sounds normal. No respiratory distress. No wheezes. No rales.  Abdominal: Soft. Bowel sounds are normal. Exhibits no distension and no mass. There is no tenderness.  Musculoskeletal: Normal range of motion. Exhibits no edema.  Lymphadenopathy:    No cervical adenopathy.  Neurological: Alert and oriented to person, place, and time. Exhibits normal muscle tone. Gait normal. Coordination normal.  Skin: Skin is warm and dry. No rash noted. Not diaphoretic. No erythema. No pallor.  Psychiatric: Mood, memory and judgment normal.  Vitals reviewed.  LABORATORY DATA: Lab Results  Component Value Date   WBC 10.1 02/05/2017   HGB 12.0  (L) 02/05/2017   HCT 38.1 (L) 02/05/2017   MCV 81.1 02/05/2017   PLT 287 02/05/2017      Chemistry      Component Value Date/Time   NA 139 02/05/2017 0851   K 4.7 02/05/2017 0851   CL 101 01/08/2017 1100   CO2 24 02/05/2017 0851   BUN 20.4 02/05/2017 0851   CREATININE 1.3 02/05/2017 0851      Component Value Date/Time   CALCIUM 9.4 02/05/2017 0851   ALKPHOS 125 02/05/2017 0851   AST 30 02/05/2017 0851   ALT 35 02/05/2017 0851   BILITOT 0.50 02/05/2017 0851       RADIOGRAPHIC STUDIES:  US Biopsy (kidney)  Result Date: 01/08/2017 INDICATION: History of presumed metastatic renal cell carcinoma. Please perform ultrasound-guided left renal mass biopsy for tissue diagnostic purposes. EXAM: ULTRASOUND GUIDED RENAL LESION BIOPSY COMPARISON:  PET-CT - 12/27/2016 MEDICATIONS: None. ANESTHESIA/SEDATION: Fentanyl 100 mcg IV; Versed 2 mg IV Total Moderate Sedation time: 14 minutes; The patient was continuously monitored during the procedure by the interventional radiology nurse under my direct supervision. COMPLICATIONS: None immediate. PROCEDURE: Informed written consent was obtained from the patient after a discussion of the risks, benefits and alternatives to treatment. The patient understands and consents the procedure. A timeout was performed  prior to the initiation of the procedure. Ultrasound scanning was performed of the left flank demonstrating an enlarged (at least 10.7 x 7.8 cm) mass arising from the superior pole of the left kidney (image 2). The procedure was planned. The operative site was prepped and draped in the usual sterile fashion. The overlying soft tissues were anesthetized with 1% lidocaine with epinephrine. A 17 gauge core needle biopsy device was advanced into the inferior aspect of the exophytic left renal mass. Under direct ultrasound guidance, 5 core needle biopsies were obtained with the use of an 18 gauge core needle biopsy device. Multiple ultrasound images were saved  for procedural documentation purposes. The coaxial needle was removed following the administration of a Gel-Foam slurry. Post procedural scanning was negative for significant post procedural hemorrhage or additional complication. A dressing was placed. The patient tolerated the procedure well without immediate post procedural complication. IMPRESSION: Technically successful ultrasound guided biopsy of left renal lesion/mass. Electronically Signed   By: Sandi Mariscal M.D.   On: 01/08/2017 15:07     ASSESSMENT/PLAN:  Renal cell carcinoma [facility]) This is a pleasant 71 year old gentleman with [name]. The patient presented with an 11 cm left kidney mass and pulmonary nodules.  He is currently on treatment with Cabometyx 60 mg daily for the past 2 weeks and is tolerating it well with the exception of diarrhea.  We discussed use of Imodium.  He understands this and is aware to take Imodium with the first loose stool.  The patient is scheduled to undergo a left nephrectomy in early December.  Recommend that he proceed with the surgery as scheduled.  He may hold his treatment 7 days prior to the procedure and will continue to hold this for up to 2 weeks under the direction of Dr. Tresa Moore.   The patient will return in approximately 1 month after his surgery for evaluation and blood work.  The patient was given a flu vaccine per his request today.  All the patient's questions were answered to his satisfaction today he was instructed to call us if he has any questions or concerns in the interim.   Mikey Bussing, DNP, AGPCNP-BC, AOCNP 02/05/17

## 2017-02-05 NOTE — Telephone Encounter (Signed)
Gave avs and calendar for January 2019 °

## 2017-02-05 NOTE — Assessment & Plan Note (Signed)
This is a pleasant 71 year old gentleman with [name]. The patient presented with an 11 cm left kidney mass and pulmonary nodules.  He is currently on treatment with Cabometyx 60 mg daily for the past 2 weeks and is tolerating it well with the exception of diarrhea.  We discussed use of Imodium.  He understands this and is aware to take Imodium with the first loose stool.  The patient is scheduled to undergo a left nephrectomy in early December.  Recommend that he proceed with the surgery as scheduled.  He may hold his treatment 7 days prior to the procedure and will continue to hold this for up to 2 weeks under the direction of Dr. Tresa Moore.   The patient will return in approximately 1 month after his surgery for evaluation and blood work.  The patient was given a flu vaccine per his request today.  All the patient's questions were answered to his satisfaction today he was instructed to call us if he has any questions or concerns in the interim.

## 2017-02-14 ENCOUNTER — Encounter (HOSPITAL_COMMUNITY): Payer: Self-pay

## 2017-02-14 NOTE — Patient Instructions (Addendum)
Affan Callow  02/14/2017   Your procedure is scheduled on: Wednesday, Dec. 5, 2018   Surgery time: 8:30 AM-11:30 AM   Report to [place]  Entrance   Take [place]  elevators to 3rd floor to  [place] at   6:30 AM.    Call this number if you have problems the morning of surgery [phone]    Remember: ONLY 1 PERSON MAY GO WITH YOU TO SHORT STAY TO GET  READY MORNING OF [place].   Do not eat food or drink liquids :After Midnight.    Take these medicines the morning of surgery with A SIP OF WATER:  Carvedilol   Do not take evening dose of Metformin the night before surgery   DO NOT TAKE ANY DIABETIC MEDICATIONS DAY OF YOUR SURGERY                               You may not have any metal on your body including  jewelry             Do not wear lotions, powders, perfumes, or deodorant                           Men may shave face and neck.   Do not bring valuables to the hospital. [place].   Contacts, dentures or bridgework may not be worn into surgery.   Leave suitcase in the car. After surgery it may be brought to your room.              Please read over the following fact sheets you were given: _____________________________________________________________________             [facility] - Preparing for Surgery Before surgery, you can play an important role.  Because skin is not sterile, your skin needs to be as free of germs as possible.  You can reduce the number of germs on your skin by washing with CHG (chlorahexidine gluconate) soap before surgery.  CHG is an antiseptic cleaner which kills germs and bonds with the skin to continue killing germs even after washing. Please DO NOT use if you have an allergy to CHG or antibacterial soaps.  If your skin becomes reddened/irritated stop using the CHG and inform your nurse when you arrive at Short Stay. Do not shave (including legs and  underarms) for at least 48 hours prior to the first CHG shower.  You may shave your face/neck.  Please follow these instructions carefully:  1.  Shower with CHG Soap the night before surgery and the  morning of surgery.  2.  If you choose to wash your hair, wash your hair first as usual with your normal  shampoo.  3.  After you shampoo, rinse your hair and body thoroughly to remove the shampoo.                             4.  Use CHG as you would any other liquid soap.  You can apply chg directly to the skin and wash.  Gently with a scrungie or clean washcloth.  5.  Apply the CHG Soap to  your body ONLY FROM THE NECK DOWN.   Do   not use on face/ open                           Wound or open sores. Avoid contact with eyes, ears mouth and   genitals (private parts).                       Wash face,  Genitals (private parts) with your normal soap.             6.  Wash thoroughly, paying special attention to the area where your    surgery  will be performed.  7.  Thoroughly rinse your body with warm water from the neck down.  8.  DO NOT shower/wash with your normal soap after using and rinsing off the CHG Soap.                9.  Pat yourself dry with a clean towel.            10.  Wear clean pajamas.            11.  Place clean sheets on your bed the night of your first shower and do not  sleep with pets. Day of Surgery : Do not apply any lotions/deodorants the morning of surgery.  Please wear clean clothes to the hospital/surgery center.  FAILURE TO FOLLOW THESE INSTRUCTIONS MAY RESULT IN THE CANCELLATION OF YOUR SURGERY  PATIENT SIGNATURE_________________________________  NURSE SIGNATURE__________________________________  ________________________________________________________________________  WHAT IS A BLOOD TRANSFUSION? Blood Transfusion Information  A transfusion is the replacement of blood or some of its parts. Blood is made up of multiple cells which provide different  functions.  Red blood cells carry oxygen and are used for blood loss replacement.  White blood cells fight against infection.  Platelets control bleeding.  Plasma helps clot blood.  Other blood products are available for specialized needs, such as hemophilia or other clotting disorders. BEFORE THE TRANSFUSION  Who gives blood for transfusions?   Healthy volunteers who are fully evaluated to make sure their blood is safe. This is blood bank blood. Transfusion therapy is the safest it has ever been in the practice of medicine. Before blood is taken from a donor, a complete history is taken to make sure that person has no history of diseases nor engages in risky social behavior (examples are intravenous drug use or sexual activity with multiple partners). The donor's travel history is screened to minimize risk of transmitting infections, such as malaria. The donated blood is tested for signs of infectious diseases, such as HIV and hepatitis. The blood is then tested to be sure it is compatible with you in order to minimize the chance of a transfusion reaction. If you or a relative donates blood, this is often done in anticipation of surgery and is not appropriate for emergency situations. It takes many days to process the donated blood. RISKS AND COMPLICATIONS Although transfusion therapy is very safe and saves many lives, the main dangers of transfusion include:   Getting an infectious disease.  Developing a transfusion reaction. This is an allergic reaction to something in the blood you were given. Every precaution is taken to prevent this. The decision to have a blood transfusion has been considered carefully by your caregiver before blood is given. Blood is not given unless the benefits outweigh the risks. AFTER THE TRANSFUSION  Right after  receiving a blood transfusion, you will usually feel much better and more energetic. This is especially true if your red blood cells have gotten low  (anemic). The transfusion raises the level of the red blood cells which carry oxygen, and this usually causes an energy increase.  The nurse administering the transfusion will monitor you carefully for complications. HOME CARE INSTRUCTIONS  No special instructions are needed after a transfusion. You may find your energy is better. Speak with your caregiver about any limitations on activity for underlying diseases you may have. SEEK MEDICAL CARE IF:   Your condition is not improving after your transfusion.  You develop redness or irritation at the intravenous (IV) site. SEEK IMMEDIATE MEDICAL CARE IF:  Any of the following symptoms occur over the next 12 hours:  Shaking chills.  You have a temperature by mouth above 102 F (38.9 C), not controlled by medicine.  Chest, back, or muscle pain.  People around you feel you are not acting correctly or are confused.  Shortness of breath or difficulty breathing.  Dizziness and fainting.  You get a rash or develop hives.  You have a decrease in urine output.  Your urine turns a dark color or changes to pink, red, or brown. Any of the following symptoms occur over the next 10 days:  You have a temperature by mouth above 102 F (38.9 C), not controlled by medicine.  Shortness of breath.  Weakness after normal activity.  The white part of the eye turns yellow (jaundice).  You have a decrease in the amount of urine or are urinating less often.  Your urine turns a dark color or changes to pink, red, or brown. Document Released: 03/03/2000 Document Revised: 05/29/2011 Document Reviewed: 10/21/2007 [employer] Patient Information 2014 [place], [place].  _______________________________________________________________________

## 2017-02-14 NOTE — Pre-Procedure Instructions (Signed)
The following are in epic: Cardiac clearance and last office visit Dr. Fletcher Anon 01/30/17 Last office visit note Dr. Mena Pauls 02/05/17 EKG 01/30/17 US carotid 12/26/16 ECHO 11/21/16 CXR 11/11/16

## 2017-02-19 ENCOUNTER — Encounter (INDEPENDENT_AMBULATORY_CARE_PROVIDER_SITE_OTHER): Payer: Self-pay

## 2017-02-19 ENCOUNTER — Encounter (HOSPITAL_COMMUNITY)
Admission: RE | Admit: 2017-02-19 | Discharge: 2017-02-19 | Disposition: A | Discharge status: Home / Self Care | Payer: Medicare Other | Source: Ambulatory Visit | Attending: Urology | Admitting: Urology

## 2017-02-19 ENCOUNTER — Other Ambulatory Visit: Payer: Self-pay

## 2017-02-19 ENCOUNTER — Encounter (HOSPITAL_COMMUNITY): Payer: Self-pay

## 2017-02-19 HISTORY — DX: Personal history of other medical treatment: Z92.89

## 2017-02-19 HISTORY — DX: Atherosclerotic heart disease of native coronary artery without angina pectoris: I25.10

## 2017-02-19 HISTORY — DX: Solitary pulmonary nodule: R91.1

## 2017-02-19 HISTORY — DX: Nonrheumatic mitral (valve) insufficiency: I34.0

## 2017-02-19 HISTORY — DX: Unspecified osteoarthritis, unspecified site: M19.90

## 2017-02-19 HISTORY — DX: Disorder of arteries and arterioles, unspecified: I77.9

## 2017-02-19 HISTORY — DX: Presence of spectacles and contact lenses: Z97.3

## 2017-02-19 HISTORY — DX: Weakness: R53.1

## 2017-02-19 HISTORY — DX: Diarrhea, unspecified: R19.7

## 2017-02-19 HISTORY — DX: Dyspnea, unspecified: R06.00

## 2017-02-19 HISTORY — DX: Anesthesia of skin: R20.0

## 2017-02-19 HISTORY — DX: Peripheral vascular disease, unspecified: I73.9

## 2017-02-19 LAB — CBC
HEMATOCRIT: 34.5 % — AB (ref 39.0–52.0)
Hemoglobin: 11.2 g/dL — ABNORMAL LOW (ref 13.0–17.0)
MCH: 25.8 pg — AB (ref 26.0–34.0)
MCHC: 32.5 g/dL (ref 30.0–36.0)
MCV: 79.5 fL (ref 78.0–100.0)
Platelets: 311 10*3/uL (ref 150–400)
RBC: 4.34 MIL/uL (ref 4.22–5.81)
RDW: 19.3 % — AB (ref 11.5–15.5)
WBC: 8.9 10*3/uL (ref 4.0–10.5)

## 2017-02-19 LAB — BASIC METABOLIC PANEL
Anion gap: 9 (ref 5–15)
BUN: 32 mg/dL — AB (ref 6–20)
CALCIUM: 8.8 mg/dL — AB (ref 8.9–10.3)
CO2: 23 mmol/L (ref 22–32)
Chloride: 105 mmol/L (ref 101–111)
Creatinine, Ser: 1.65 mg/dL — ABNORMAL HIGH (ref 0.61–1.24)
GFR calc Af Amer: 47 mL/min — ABNORMAL LOW (ref >60)
GFR, EST NON AFRICAN AMERICAN: 40 mL/min — AB (ref >60)
GLUCOSE: 120 mg/dL — AB (ref 65–99)
Potassium: 4.8 mmol/L (ref 3.5–5.1)
Sodium: 137 mmol/L (ref 135–145)

## 2017-02-19 LAB — PREPARE RBC (CROSSMATCH)

## 2017-02-19 LAB — GLUCOSE, CAPILLARY: Glucose-Capillary: 101 mg/dL — ABNORMAL HIGH (ref 65–99)

## 2017-02-19 NOTE — Pre-Procedure Instructions (Signed)
CBC and BMP faxed to Dr. Tresa Moore via epic.

## 2017-02-19 NOTE — Pre-Procedure Instructions (Signed)
[name] states his blood pressures and heart rates read low on his monitor at home.  He states his blood pressures are in the 80's and low 90's and his heart rate is usually in the 50's. He has taken his Lisinopril and Carvedilol this morning.  Dr. Sharlene Motts made aware.  Per Dr. Jillyn Hidden inform Trevor [name] not to take Lisinorpril the AM of surgery but continue to take the Carvedilol AM of surgery.  Trevor [name] verbalized understanding at this time.

## 2017-02-20 LAB — HEMOGLOBIN A1C
Hgb A1c MFr Bld: 6.7 % — ABNORMAL HIGH (ref 4.8–5.6)
Mean Plasma Glucose: 146 mg/dL

## 2017-02-20 NOTE — Anesthesia Preprocedure Evaluation (Addendum)
Anesthesia Evaluation  Patient identified by MRN, date of birth, ID band Patient awake    Reviewed: Allergy & Precautions, NPO status , Patient's Chart, lab work & pertinent test results  Airway Mallampati: II  TM Distance: >3 FB Neck ROM: Full    Dental  (+) Dental Advisory Given   Pulmonary former smoker,    breath sounds clear to auscultation       Cardiovascular hypertension, Pt. on medications and Pt. on home beta blockers + CAD, + Past MI, + CABG and + Peripheral Vascular Disease   Rhythm:Regular Rate:Normal     Neuro/Psych TIACVA    GI/Hepatic negative GI ROS, Neg liver ROS,   Endo/Other  diabetes, Type 2, Oral Hypoglycemic Agents  Renal/GU Renal disease     Musculoskeletal   Abdominal   Peds  Hematology  (+) anemia ,   Anesthesia Other Findings   Reproductive/Obstetrics                            Lab Results  Component Value Date   WBC 8.9 02/19/2017   HGB 11.2 (L) 02/19/2017   HCT 34.5 (L) 02/19/2017   MCV 79.5 02/19/2017   PLT 311 02/19/2017   Lab Results  Component Value Date   CREATININE 1.65 (H) 02/19/2017   BUN 32 (H) 02/19/2017   NA 137 02/19/2017   K 4.8 02/19/2017   CL 105 02/19/2017   CO2 23 02/19/2017    Anesthesia Physical Anesthesia Plan  ASA: III  Anesthesia Plan: General   Post-op Pain Management:    Induction: Intravenous  PONV Risk Score and Plan: 3 and Ondansetron, Dexamethasone and Treatment may vary due to age or medical condition  Airway Management Planned: Oral ETT  Additional Equipment: Arterial line  Intra-op Plan:   Post-operative Plan: Extubation in OR  Informed Consent: I have reviewed the patients History and Physical, chart, labs and discussed the procedure including the risks, benefits and alternatives for the proposed anesthesia with the patient or authorized representative who has indicated his/her understanding and  acceptance.   Dental advisory given  Plan Discussed with: CRNA  Anesthesia Plan Comments:       Anesthesia Quick Evaluation

## 2017-02-21 ENCOUNTER — Inpatient Hospital Stay (HOSPITAL_COMMUNITY)
Admission: RE | Admit: 2017-02-21 | Discharge: 2017-02-23 | DRG: 657 | Disposition: A | Discharge status: Home / Self Care | Payer: Medicare Other | Source: Ambulatory Visit | Attending: Urology | Admitting: Urology

## 2017-02-21 ENCOUNTER — Other Ambulatory Visit: Payer: Self-pay

## 2017-02-21 ENCOUNTER — Inpatient Hospital Stay (HOSPITAL_COMMUNITY): Payer: Medicare Other | Admitting: Anesthesiology

## 2017-02-21 ENCOUNTER — Encounter (HOSPITAL_COMMUNITY)
Admission: RE | Disposition: A | Discharge status: Home / Self Care | Payer: Self-pay | Source: Ambulatory Visit | Attending: Urology

## 2017-02-21 ENCOUNTER — Encounter (HOSPITAL_COMMUNITY): Payer: Self-pay | Admitting: Anesthesiology

## 2017-02-21 DIAGNOSIS — C642 Malignant neoplasm of left kidney, except renal pelvis: Principal | ICD-10-CM | POA: Diagnosis present

## 2017-02-21 DIAGNOSIS — I2510 Atherosclerotic heart disease of native coronary artery without angina pectoris: Secondary | ICD-10-CM | POA: Diagnosis not present

## 2017-02-21 DIAGNOSIS — E875 Hyperkalemia: Secondary | ICD-10-CM | POA: Diagnosis not present

## 2017-02-21 DIAGNOSIS — Z8673 Personal history of transient ischemic attack (TIA), and cerebral infarction without residual deficits: Secondary | ICD-10-CM

## 2017-02-21 DIAGNOSIS — I252 Old myocardial infarction: Secondary | ICD-10-CM | POA: Diagnosis not present

## 2017-02-21 DIAGNOSIS — Z79899 Other long term (current) drug therapy: Secondary | ICD-10-CM

## 2017-02-21 DIAGNOSIS — R319 Hematuria, unspecified: Secondary | ICD-10-CM | POA: Diagnosis not present

## 2017-02-21 DIAGNOSIS — Z823 Family history of stroke: Secondary | ICD-10-CM

## 2017-02-21 DIAGNOSIS — Z8249 Family history of ischemic heart disease and other diseases of the circulatory system: Secondary | ICD-10-CM | POA: Diagnosis not present

## 2017-02-21 DIAGNOSIS — C7802 Secondary malignant neoplasm of left lung: Secondary | ICD-10-CM | POA: Diagnosis present

## 2017-02-21 DIAGNOSIS — D649 Anemia, unspecified: Secondary | ICD-10-CM | POA: Diagnosis not present

## 2017-02-21 DIAGNOSIS — Z7982 Long term (current) use of aspirin: Secondary | ICD-10-CM | POA: Diagnosis not present

## 2017-02-21 DIAGNOSIS — Z809 Family history of malignant neoplasm, unspecified: Secondary | ICD-10-CM

## 2017-02-21 DIAGNOSIS — E118 Type 2 diabetes mellitus with unspecified complications: Secondary | ICD-10-CM | POA: Diagnosis present

## 2017-02-21 DIAGNOSIS — Z791 Long term (current) use of non-steroidal anti-inflammatories (NSAID): Secondary | ICD-10-CM

## 2017-02-21 DIAGNOSIS — Z951 Presence of aortocoronary bypass graft: Secondary | ICD-10-CM | POA: Diagnosis not present

## 2017-02-21 DIAGNOSIS — Z87891 Personal history of nicotine dependence: Secondary | ICD-10-CM | POA: Diagnosis not present

## 2017-02-21 DIAGNOSIS — Z7984 Long term (current) use of oral hypoglycemic drugs: Secondary | ICD-10-CM | POA: Diagnosis not present

## 2017-02-21 DIAGNOSIS — N2889 Other specified disorders of kidney and ureter: Secondary | ICD-10-CM | POA: Diagnosis present

## 2017-02-21 DIAGNOSIS — I10 Essential (primary) hypertension: Secondary | ICD-10-CM | POA: Diagnosis present

## 2017-02-21 HISTORY — PX: ROBOT ASSISTED LAPAROSCOPIC NEPHRECTOMY: SHX5140

## 2017-02-21 LAB — GLUCOSE, CAPILLARY
GLUCOSE-CAPILLARY: 114 mg/dL — AB (ref 65–99)
GLUCOSE-CAPILLARY: 122 mg/dL — AB (ref 65–99)
Glucose-Capillary: 144 mg/dL — ABNORMAL HIGH (ref 65–99)

## 2017-02-21 LAB — HEMOGLOBIN AND HEMATOCRIT, BLOOD
HCT: 29.8 % — ABNORMAL LOW (ref 39.0–52.0)
Hemoglobin: 9.4 g/dL — ABNORMAL LOW (ref 13.0–17.0)

## 2017-02-21 SURGERY — NEPHRECTOMY, RADICAL, ROBOT-ASSISTED, LAPAROSCOPIC, ADULT
Anesthesia: General | Laterality: Left

## 2017-02-21 MED ORDER — MIDAZOLAM HCL 2 MG/2ML IJ SOLN
INTRAMUSCULAR | Status: AC
  Filled 2017-02-21: qty 2

## 2017-02-21 MED ORDER — ONDANSETRON HCL 4 MG/2ML IJ SOLN
INTRAMUSCULAR | Status: AC
  Filled 2017-02-21: qty 4

## 2017-02-21 MED ORDER — CEFAZOLIN SODIUM-DEXTROSE 2-4 GM/100ML-% IV SOLN
INTRAVENOUS | Status: AC
  Filled 2017-02-21: qty 100

## 2017-02-21 MED ORDER — ONDANSETRON HCL 4 MG/2ML IJ SOLN: 4.0000 mg | INTRAMUSCULAR | Status: DC | PRN

## 2017-02-21 MED ORDER — INSULIN ASPART 100 UNIT/ML ~~LOC~~ SOLN
0.0000 [IU] | Freq: Three times a day (TID) | SUBCUTANEOUS | Status: DC
  Administered 2017-02-22: 2 [IU] via SUBCUTANEOUS

## 2017-02-21 MED ORDER — BOOST / RESOURCE BREEZE PO LIQD
1.0000 | Freq: Three times a day (TID) | ORAL | Status: DC
  Administered 2017-02-21: 1 via ORAL

## 2017-02-21 MED ORDER — ALBUMIN HUMAN 5 % IV SOLN
INTRAVENOUS | Status: AC
  Filled 2017-02-21: qty 250

## 2017-02-21 MED ORDER — DEXAMETHASONE SODIUM PHOSPHATE 10 MG/ML IJ SOLN
INTRAMUSCULAR | Status: DC | PRN
  Administered 2017-02-21: 10 mg via INTRAVENOUS

## 2017-02-21 MED ORDER — FENTANYL CITRATE (PF) 250 MCG/5ML IJ SOLN
INTRAMUSCULAR | Status: AC
  Filled 2017-02-21: qty 5

## 2017-02-21 MED ORDER — DIPHENHYDRAMINE HCL 50 MG/ML IJ SOLN: 12.5000 mg | Freq: Four times a day (QID) | INTRAMUSCULAR | Status: DC | PRN

## 2017-02-21 MED ORDER — LACTATED RINGERS IV SOLN
INTRAVENOUS | Status: DC | PRN
  Administered 2017-02-21 (×2): via INTRAVENOUS

## 2017-02-21 MED ORDER — OXYCODONE HCL 5 MG PO TABS
5.0000 mg | ORAL_TABLET | ORAL | Status: DC | PRN
  Administered 2017-02-22 – 2017-02-23 (×4): 5 mg via ORAL
  Filled 2017-02-21 (×4): qty 1

## 2017-02-21 MED ORDER — SUGAMMADEX SODIUM 200 MG/2ML IV SOLN
INTRAVENOUS | Status: DC | PRN
  Administered 2017-02-21: 200 mg via INTRAVENOUS

## 2017-02-21 MED ORDER — FENTANYL CITRATE (PF) 100 MCG/2ML IJ SOLN
INTRAMUSCULAR | Status: AC
  Filled 2017-02-21: qty 2

## 2017-02-21 MED ORDER — ACETAMINOPHEN 500 MG PO TABS
1000.0000 mg | ORAL_TABLET | Freq: Four times a day (QID) | ORAL | Status: AC
  Administered 2017-02-21 – 2017-02-22 (×4): 1000 mg via ORAL
  Filled 2017-02-21 (×4): qty 2

## 2017-02-21 MED ORDER — BSS IO SOLN
15.0000 mL | Freq: Once | INTRAOCULAR | Status: DC
  Filled 2017-02-21: qty 15

## 2017-02-21 MED ORDER — CARVEDILOL 3.125 MG PO TABS
3.1250 mg | ORAL_TABLET | Freq: Two times a day (BID) | ORAL | Status: DC
  Administered 2017-02-21 – 2017-02-23 (×4): 3.125 mg via ORAL
  Filled 2017-02-21 (×4): qty 1

## 2017-02-21 MED ORDER — ROCURONIUM BROMIDE 10 MG/ML (PF) SYRINGE
PREFILLED_SYRINGE | INTRAVENOUS | Status: DC | PRN
Start: 2017-02-21 — End: 2017-02-21
  Administered 2017-02-21 (×3): 10 mg via INTRAVENOUS
  Administered 2017-02-21: 20 mg via INTRAVENOUS
  Administered 2017-02-21: 50 mg via INTRAVENOUS
  Administered 2017-02-21: 10 mg via INTRAVENOUS

## 2017-02-21 MED ORDER — LIDOCAINE 2% (20 MG/ML) 5 ML SYRINGE
INTRAMUSCULAR | Status: DC | PRN
  Administered 2017-02-21: 80 mg via INTRAVENOUS

## 2017-02-21 MED ORDER — ROCURONIUM BROMIDE 50 MG/5ML IV SOSY
PREFILLED_SYRINGE | INTRAVENOUS | Status: AC
  Filled 2017-02-21: qty 20

## 2017-02-21 MED ORDER — PHENYLEPHRINE HCL 10 MG/ML IJ SOLN
INTRAVENOUS | Status: DC | PRN
  Administered 2017-02-21: 80 ug/min via INTRAVENOUS

## 2017-02-21 MED ORDER — CABOZANTINIB S-MALATE 60 MG PO TABS
60.0000 mg | ORAL_TABLET | Freq: Every day | ORAL | Status: DC
Start: 2017-02-21 — End: 2017-02-21

## 2017-02-21 MED ORDER — BSS IO SOLN
30.0000 mL | Freq: Once | INTRAOCULAR | Status: AC
  Administered 2017-02-21: 30 mL
  Filled 2017-02-21: qty 30

## 2017-02-21 MED ORDER — PROPOFOL 10 MG/ML IV BOLUS
INTRAVENOUS | Status: DC | PRN
Start: 2017-02-21 — End: 2017-02-21
  Administered 2017-02-21: 100 mg via INTRAVENOUS

## 2017-02-21 MED ORDER — ATORVASTATIN CALCIUM 40 MG PO TABS
40.0000 mg | ORAL_TABLET | Freq: Every day | ORAL | Status: DC
  Administered 2017-02-21 – 2017-02-23 (×3): 40 mg via ORAL
  Filled 2017-02-21 (×4): qty 1

## 2017-02-21 MED ORDER — LIDOCAINE 2% (20 MG/ML) 5 ML SYRINGE
INTRAMUSCULAR | Status: AC
  Filled 2017-02-21: qty 10

## 2017-02-21 MED ORDER — PANTOPRAZOLE SODIUM 40 MG PO TBEC
40.0000 mg | DELAYED_RELEASE_TABLET | Freq: Every day | ORAL | Status: DC
  Administered 2017-02-21 – 2017-02-22 (×2): 40 mg via ORAL
  Filled 2017-02-21 (×2): qty 1

## 2017-02-21 MED ORDER — KETOROLAC TROMETHAMINE 0.5 % OP SOLN
1.0000 [drp] | Freq: Three times a day (TID) | OPHTHALMIC | Status: AC | PRN
  Administered 2017-02-21: 1 [drp] via OPHTHALMIC
  Filled 2017-02-21: qty 5

## 2017-02-21 MED ORDER — LACTATED RINGERS IV SOLN
INTRAVENOUS | Status: DC | PRN
  Administered 2017-02-21 (×2): via INTRAVENOUS

## 2017-02-21 MED ORDER — BUPIVACAINE LIPOSOME 1.3 % IJ SUSP
20.0000 mL | Freq: Once | INTRAMUSCULAR | Status: AC
  Administered 2017-02-21: 20 mL
  Filled 2017-02-21: qty 20

## 2017-02-21 MED ORDER — FENTANYL CITRATE (PF) 100 MCG/2ML IJ SOLN
25.0000 ug | INTRAMUSCULAR | Status: AC | PRN
  Administered 2017-02-21 (×2): 50 ug via INTRAVENOUS
  Administered 2017-02-21: 100 ug via INTRAVENOUS
  Administered 2017-02-21 (×5): 50 ug via INTRAVENOUS

## 2017-02-21 MED ORDER — FERROUS SULFATE 325 (65 FE) MG PO TABS
325.0000 mg | ORAL_TABLET | Freq: Every day | ORAL | Status: DC
  Administered 2017-02-22 – 2017-02-23 (×2): 325 mg via ORAL
  Filled 2017-02-21 (×2): qty 1

## 2017-02-21 MED ORDER — ALBUMIN HUMAN 5 % IV SOLN
INTRAVENOUS | Status: DC | PRN
  Administered 2017-02-21: 09:00:00 via INTRAVENOUS

## 2017-02-21 MED ORDER — DIPHENHYDRAMINE HCL 12.5 MG/5ML PO ELIX: 12.5000 mg | ORAL_SOLUTION | Freq: Four times a day (QID) | ORAL | Status: DC | PRN

## 2017-02-21 MED ORDER — LISINOPRIL 5 MG PO TABS
5.0000 mg | ORAL_TABLET | Freq: Every day | ORAL | Status: DC
  Administered 2017-02-21 – 2017-02-23 (×3): 5 mg via ORAL
  Filled 2017-02-21 (×3): qty 1

## 2017-02-21 MED ORDER — DEXAMETHASONE SODIUM PHOSPHATE 10 MG/ML IJ SOLN
INTRAMUSCULAR | Status: AC
Start: 2017-02-21 — End: ?
  Filled 2017-02-21: qty 2

## 2017-02-21 MED ORDER — HYDROMORPHONE HCL 1 MG/ML IJ SOLN
0.5000 mg | INTRAMUSCULAR | Status: DC | PRN
  Administered 2017-02-21 – 2017-02-22 (×2): 1 mg via INTRAVENOUS
  Filled 2017-02-21 (×2): qty 1

## 2017-02-21 MED ORDER — CEFAZOLIN SODIUM-DEXTROSE 2-4 GM/100ML-% IV SOLN
2.0000 g | INTRAVENOUS | Status: AC
  Administered 2017-02-21: 2 g via INTRAVENOUS

## 2017-02-21 MED ORDER — PROPOFOL 10 MG/ML IV BOLUS
INTRAVENOUS | Status: AC
  Filled 2017-02-21: qty 40

## 2017-02-21 MED ORDER — STERILE WATER FOR IRRIGATION IR SOLN
Status: DC | PRN
  Administered 2017-02-21: 1000 mL

## 2017-02-21 MED ORDER — HYDROCODONE-ACETAMINOPHEN 5-325 MG PO TABS: 1.0000 | ORAL_TABLET | Freq: Four times a day (QID) | ORAL | 0 refills | Status: DC | PRN

## 2017-02-21 MED ORDER — SUGAMMADEX SODIUM 200 MG/2ML IV SOLN
INTRAVENOUS | Status: AC
  Filled 2017-02-21: qty 2

## 2017-02-21 MED ORDER — POLYMYXIN B-TRIMETHOPRIM 10000-0.1 UNIT/ML-% OP SOLN
1.0000 [drp] | Freq: Three times a day (TID) | OPHTHALMIC | Status: AC
  Administered 2017-02-21 – 2017-02-22 (×2): 1 [drp] via OPHTHALMIC
  Filled 2017-02-21: qty 10

## 2017-02-21 MED ORDER — ONDANSETRON HCL 4 MG/2ML IJ SOLN
4.0000 mg | Freq: Once | INTRAMUSCULAR | Status: AC | PRN
  Administered 2017-02-21: 4 mg via INTRAVENOUS

## 2017-02-21 MED ORDER — SODIUM CHLORIDE 0.9 % IJ SOLN
INTRAMUSCULAR | Status: AC
  Filled 2017-02-21: qty 50

## 2017-02-21 MED ORDER — SODIUM CHLORIDE 0.45 % IV SOLN
INTRAVENOUS | Status: DC
  Administered 2017-02-21 – 2017-02-22 (×2): via INTRAVENOUS

## 2017-02-21 MED ORDER — LACTATED RINGERS IR SOLN
Status: DC | PRN
  Administered 2017-02-21: 1000 mL

## 2017-02-21 MED ORDER — SODIUM CHLORIDE 0.9 % IJ SOLN
INTRAMUSCULAR | Status: DC | PRN
  Administered 2017-02-21: 20 mL

## 2017-02-21 SURGICAL SUPPLY — 58 items
BAG LAPAROSCOPIC 12 15 PORT 16 (BASKET) ×2 IMPLANT
BAG RETRIEVAL 12/15 (BASKET) ×4
BAG RETRIEVAL 12/15MM (BASKET) ×2
CHLORAPREP W/TINT 26ML (MISCELLANEOUS) ×3 IMPLANT
CLIP VESOLOCK LG 6/CT PURPLE (CLIP) ×3 IMPLANT
CLIP VESOLOCK MED LG 6/CT (CLIP) ×3 IMPLANT
CLIP VESOLOCK XL 6/CT (CLIP) ×3 IMPLANT
COVER SURGICAL LIGHT HANDLE (MISCELLANEOUS) ×3 IMPLANT
COVER TIP SHEARS 8 DVNC (MISCELLANEOUS) ×1 IMPLANT
COVER TIP SHEARS 8MM DA VINCI (MISCELLANEOUS) ×2
CUTTER ECHEON FLEX ENDO 45 340 (ENDOMECHANICALS) ×3 IMPLANT
DECANTER SPIKE VIAL GLASS SM (MISCELLANEOUS) ×3 IMPLANT
DERMABOND ADVANCED (GAUZE/BANDAGES/DRESSINGS) ×2
DERMABOND ADVANCED .7 DNX12 (GAUZE/BANDAGES/DRESSINGS) ×1 IMPLANT
DRAIN CHANNEL 15F RND FF 3/16 (WOUND CARE) IMPLANT
DRAPE ARM DVNC X/XI (DISPOSABLE) ×4 IMPLANT
DRAPE COLUMN DVNC XI (DISPOSABLE) ×1 IMPLANT
DRAPE DA VINCI XI ARM (DISPOSABLE) ×8
DRAPE DA VINCI XI COLUMN (DISPOSABLE) ×2
DRAPE INCISE IOBAN 66X45 STRL (DRAPES) ×3 IMPLANT
DRAPE LAPAROSCOPIC ABDOMINAL (DRAPES) IMPLANT
DRAPE SHEET LG 3/4 BI-LAMINATE (DRAPES) ×3 IMPLANT
ELECT PENCIL ROCKER SW 15FT (MISCELLANEOUS) ×3 IMPLANT
ELECT REM PT RETURN 15FT ADLT (MISCELLANEOUS) ×3 IMPLANT
EVACUATOR SILICONE 100CC (DRAIN) IMPLANT
GLOVE BIO SURGEON STRL SZ 6.5 (GLOVE) ×2 IMPLANT
GLOVE BIO SURGEONS STRL SZ 6.5 (GLOVE) ×1
GLOVE BIOGEL M STRL SZ7.5 (GLOVE) ×12 IMPLANT
GOWN STRL REUS W/TWL LRG LVL3 (GOWN DISPOSABLE) ×15 IMPLANT
IRRIG SUCT STRYKERFLOW 2 WTIP (MISCELLANEOUS) ×3
IRRIGATION SUCT STRKRFLW 2 WTP (MISCELLANEOUS) ×1 IMPLANT
KIT BASIN OR (CUSTOM PROCEDURE TRAY) ×3 IMPLANT
LOOP VESSEL MAXI BLUE (MISCELLANEOUS) ×3 IMPLANT
NEEDLE INSUFFLATION 14GA 120MM (NEEDLE) ×3 IMPLANT
PORT ACCESS TROCAR AIRSEAL 12 (TROCAR) ×1 IMPLANT
PORT ACCESS TROCAR AIRSEAL 5M (TROCAR) ×2
POSITIONER SURGICAL ARM (MISCELLANEOUS) ×6 IMPLANT
RELOAD STAPLER WHITE 60MM (STAPLE) IMPLANT
RELOAD WH ECHELON 45 (STAPLE) ×21 IMPLANT
SEAL CANN UNIV 5-8 DVNC XI (MISCELLANEOUS) ×4 IMPLANT
SEAL XI 5MM-8MM UNIVERSAL (MISCELLANEOUS) ×8
SET TRI-LUMEN FLTR TB AIRSEAL (TUBING) ×3 IMPLANT
SOLUTION ELECTROLUBE (MISCELLANEOUS) ×3 IMPLANT
SPONGE LAP 4X18 X RAY DECT (DISPOSABLE) ×3 IMPLANT
STAPLE ECHEON FLEX 60 POW ENDO (STAPLE) IMPLANT
STAPLER RELOAD WHITE 60MM (STAPLE)
SUT ETHILON 3 0 PS 1 (SUTURE) IMPLANT
SUT MNCRL AB 4-0 PS2 18 (SUTURE) ×6 IMPLANT
SUT PDS AB 1 CT1 27 (SUTURE) ×12 IMPLANT
SUT VICRYL 0 UR6 27IN ABS (SUTURE) IMPLANT
TOWEL OR 17X26 10 PK STRL BLUE (TOWEL DISPOSABLE) ×3 IMPLANT
TOWEL OR NON WOVEN STRL DISP B (DISPOSABLE) ×3 IMPLANT
TRAY FOLEY W/METER SILVER 16FR (SET/KITS/TRAYS/PACK) ×3 IMPLANT
TRAY LAPAROSCOPIC (CUSTOM PROCEDURE TRAY) ×3 IMPLANT
TROCAR BLADELESS OPT 12M 100M (ENDOMECHANICALS) ×3 IMPLANT
TROCAR BLADELESS OPT 5 100 (ENDOMECHANICALS) IMPLANT
TROCAR XCEL 12X100 BLDLESS (ENDOMECHANICALS) ×3 IMPLANT
WATER STERILE IRR 1000ML POUR (IV SOLUTION) ×3 IMPLANT

## 2017-02-21 NOTE — Progress Notes (Signed)
Dr. Suzette Battiest in- made aware of patient complaining of right eye bothering him- "Feels like something in it"- orders given

## 2017-02-21 NOTE — Transfer of Care (Signed)
Immediate Anesthesia Transfer of Care Note  Patient: Trevor [name]  Procedure(s) Performed: XI ROBOTIC ASSISTED LAPAROSCOPIC NEPHRECTOMY (Left )  Patient Location: PACU  Anesthesia Type:General  Level of Consciousness: awake, alert  and oriented  Airway & Oxygen Therapy: Patient Spontanous Breathing and Patient connected to face mask oxygen  Post-op Assessment: Report given to RN  Post vital signs: Reviewed and stable  Last Vitals:  Vitals:   02/21/17 0625  BP: 100/76  Pulse: 93  Resp: 18  Temp: (!) 36.3 C  SpO2: 100%    Last Pain:  Vitals:   02/21/17 0625  TempSrc: Oral      Patients Stated Pain Goal: 4 (47/07/61 5183)  Complications: No apparent anesthesia complications

## 2017-02-21 NOTE — Progress Notes (Signed)
Post-op note  Subjective: The patient is doing well.  No complaints. Bothersome right eye, cold compress applied.   Objective: Vital signs in last 24 hours: Temp:  [97.4 F (36.3 C)-97.8 F (36.6 C)] 97.6 F (36.4 C) (12/05 1445) Pulse Rate:  [82-93] 82 (12/05 1530) Resp:  [4-19] 4 (12/05 1530) BP: (91-132)/(56-82) 126/73 (12/05 1530) SpO2:  [97 %-100 %] 100 % (12/05 1530) Arterial Line BP: (102-143)/(56-72) 130/68 (12/05 1348) Weight:  [71.7 kg (158 lb)] 71.7 kg (158 lb) (12/05 2671)  Intake/Output from previous day: No intake/output data recorded. Intake/Output this shift: Total I/O In: 1850 [I.V.:1600; IV Piggyback:250] Out: 250 [Urine:100; Blood:150]  Physical Exam:  General: Alert and oriented. Abdomen: Soft, Nondistended, minimal tenderness  Incisions: Clean and dry. GU: foley catheter to drainage, draining clear urine  Lab Results: Recent Labs    02/19/17 0859 02/21/17 1147  HGB 11.2* 9.4*  HCT 34.5* 29.8*    Assessment/Plan: POD#0 s/p L robotic Nx. Hgb 9.4 post op. Doing well   1) Continue to monitor   Alla Feeling, MD, MD   LOS: 0 days   Alla Feeling, MD 02/21/2017, 3:47 PM   I have seen and examined the patient and agree with Dr. Candis Schatz assesment and plan. Doing well initially POD 0. Hcb with expected drop given baseline anemia and some intra-op blood loss.

## 2017-02-21 NOTE — H&P (Signed)
Trevor [name] is an 71 y.o. male.    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 Mass is 12cm with few small Rt > Lt pulm mets, no hilar adenopathy. 1 artery / 1 vein renovascular anatomy with some parasitis especially upper from splenic and inferior phrenic derivitives. Cr 1.1, no Rt sided renal lesions. He continues to have hematuria and anemia with Hgb 8-9. BX proven chomophobe RCC.   Present Management:  Votrient 800mg  QD per Dr Earlie Server   [facility] sig for CVA 2018 ( in setting of anemia and moderate carotid disease, follows Pramod Sethi neruol, no residual deficits), CAD/CABG (cath 2018 with some occlusion, EF 40%, follows Dr. Fletcher Anon cards), DM2 (A1c <6), HTN. His PCP is [name] Via MD.   Today "Trevor [name]" is seen to proceed with LEFT robotic radical nephrectomy. Most recetn Hgb 11.6, has cards clearance.    Past Medical History:  Diagnosis Date  . 3-vessel coronary artery disease   . Anemia    2 iron infusions  . Arthritis   . Blood in urine    blood clots noted since biopsy  . Cancer ([place])    Left kidney cancer  . Carotid disease, bilateral (H[facility])    moderate  . Coronary artery disease    06/2012: Anterior ST elevation myocardial infarction. Cardiac catheterization showed significant three-vessel coronary artery disease, ejection fraction 35-40%. The patient underwent CABG with LIMA to LAD, SVG to ramus and sequential SVG to right PDA/PL  . Diabetes mellitus without complication ([place])   . Diarrhea   . Dyspnea    mild  . Ejection fraction < 50%   . Frequent urination    comes and goes since biospy  . History of blood transfusion   . Hyperlipidemia   . Ischemic cardiomyopathy    Ejection fraction of 35-40% initially, 45 - 50% on echo 2014, 53% by perfusion study 2016  . Mild mitral  regurgitation   . Non-ST elevated myocardial infarction (non-STEMI) ([place]) 10/2016  . Numbness    Right side since stroke  . OA (osteoarthritis)   . PAD (peripheral artery disease) ([place])   . Pulmonary nodule   . STEMI (ST elevation myocardial infarction) ([place]) 06/2012   anterior   . Stroke ([place]) 2018  . TIA (transient ischemic attack) 10/2016  . Weakness generalized   . Wears glasses     Past Surgical History:  Procedure Laterality Date  . CARDIOVASCULAR STRESS TEST    . CORONARY ARTERY BYPASS GRAFT N/A 07/05/2012   Procedure: CORONARY ARTERY BYPASS GRAFTING (CABG);  Surgeon: Ivin Poot, MD;  Location: [place];  Service: Open Heart Surgery;  Laterality: N/A;  . INTRAOPERATIVE TRANSESOPHAGEAL ECHOCARDIOGRAM N/A 07/05/2012   Procedure: INTRAOPERATIVE TRANSESOPHAGEAL ECHOCARDIOGRAM;  Surgeon: Ivin Poot, MD;  Location: [facility];  Service: Open Heart Surgery;  Laterality: N/A;  . LEFT HEART CATH AND CORS/GRAFTS ANGIOGRAPHY N/A 11/14/2016   Procedure: LEFT HEART CATH AND CORS/GRAFTS ANGIOGRAPHY;  Surgeon: Belva Crome, MD;  Location: [place] CV LAB;  Service: Cardiovascular;  Laterality: N/A;  . LEFT HEART CATHETERIZATION WITH CORONARY ANGIOGRAM N/A 07/04/2012   Procedure: LEFT HEART CATHETERIZATION WITH CORONARY ANGIOGRAM;  Surgeon: Wellington Hampshire, MD;  Location: [place] CATH LAB;  Service: Cardiovascular;  Laterality: N/A;  . RENAL BIOPSY  Family History  Problem Relation Age of Onset  . Cancer Mother        leukemia  . Stroke Mother   . Cancer Maternal Grandfather        possible cancer  . Heart attack Brother 46   Social History:  reports that he quit smoking about 4 years ago. He has a 50.00 pack-year smoking history. he has never used smokeless tobacco. He reports that he does not drink alcohol or use drugs.  Allergies: No Known Allergies  Medications Prior to Admission  Medication Sig Dispense Refill  . aspirin EC 81 MG tablet Take 81 mg by mouth daily.    Marland Kitchen  atorvastatin (LIPITOR) 40 MG tablet Take 1 tablet (40 mg total) by mouth daily. 30 tablet 6  . cabozantinib S-Malate 60 MG TABS Take 60 mg by mouth daily. Take on an empty stomach 1 hour before or 2 hours after a meal. 30 tablet 2  . carvedilol (COREG) 3.125 MG tablet Take 1 tablet (3.125 mg total) by mouth 2 (two) times daily. 180 tablet 2  . ferrous sulfate 325 (65 FE) MG tablet Take 325 mg by mouth daily with breakfast.    . lisinopril (PRINIVIL,ZESTRIL) 5 MG tablet Take 1 tablet (5 mg total) daily by mouth. 90 tablet 3  . metFORMIN (GLUCOPHAGE) 1000 MG tablet Take 1,000 mg by mouth 2 (two) times daily.   0  . naproxen sodium (ALEVE) 220 MG tablet Take 220 mg by mouth 2 (two) times daily as needed (for pain or headache).    . pantoprazole (PROTONIX) 40 MG tablet Take 1 tablet (40 mg total) by mouth at bedtime. 30 tablet 0    Results for orders placed or performed during the hospital encounter of 02/21/17 (from the past 48 hour(s))  Glucose, capillary     Status: Abnormal   Collection Time: 02/21/17  6:22 AM  Result Value Ref Range   Glucose-Capillary 114 (H) 65 - 99 mg/dL   No results found.  Review of Systems  Constitutional: Positive for malaise/fatigue. Negative for chills and fever.  HENT: Negative.   Eyes: Negative.   Respiratory: Negative.   Cardiovascular: Negative.   Gastrointestinal: Negative.   Genitourinary: Positive for hematuria. Negative for flank pain.  Musculoskeletal: Negative.   Skin: Negative.   Neurological: Negative.   Endo/Heme/Allergies: Negative.   Psychiatric/Behavioral: Negative.     Blood pressure 100/76, pulse 93, temperature (!) 97.4 F (36.3 C), temperature source Oral, resp. rate 18, height 5\' 11"  (1.803 m), weight 71.7 kg (158 lb), SpO2 100 %. Physical Exam  Constitutional: He appears well-developed.  HENT:  Head: Normocephalic.  Eyes: Pupils are equal, round, and reactive to light.  Neck: Normal range of motion.  Cardiovascular: Normal  rate.  Respiratory: Effort normal.  GI: Soft.  Genitourinary:  Genitourinary Comments: NO CVAT at present.   Musculoskeletal: Normal range of motion.  Neurological: He is alert.  Skin: Skin is warm.  Psychiatric: He has a normal mood and affect.     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 Risks, beneftis, alternatives, expected peri-op course, goals of care (disesae control, hopefully cure) discussed previousliy and reiterated today.   Alexis Frock, MD 02/21/2017, 7:03 AM

## 2017-02-21 NOTE — Addendum Note (Signed)
Addendum  created 02/21/17 1455 by Suzette Battiest, MD   Order list changed, Order sets accessed

## 2017-02-21 NOTE — Progress Notes (Signed)
Hgb. And Hct.drawn using A-line

## 2017-02-21 NOTE — Anesthesia Postprocedure Evaluation (Signed)
Anesthesia Post Note  Patient: Trevor [name]  Procedure(s) Performed: XI ROBOTIC ASSISTED LAPAROSCOPIC NEPHRECTOMY (Left )     Patient location during evaluation: PACU Anesthesia Type: General Level of consciousness: awake and alert Pain management: pain level controlled Vital Signs Assessment: post-procedure vital signs reviewed and stable Respiratory status: spontaneous breathing, nonlabored ventilation, respiratory function stable and patient connected to nasal cannula oxygen Cardiovascular status: blood pressure returned to baseline and stable Postop Assessment: no apparent nausea or vomiting Anesthetic complications: no    Last Vitals:  Vitals:   02/21/17 1339 02/21/17 1340  BP:    Pulse: 88 87  Resp: 14 13  Temp:    SpO2: 97% 100%    Last Pain:  Vitals:   02/21/17 1315  TempSrc:   PainSc: 2                  Tiajuana Amass

## 2017-02-21 NOTE — Anesthesia Procedure Notes (Signed)
Procedure Name: Intubation Date/Time: 02/21/2017 9:14 AM Performed by: Lavina Hamman, CRNA Pre-anesthesia Checklist: Patient identified, Emergency Drugs available, Suction available, Patient being monitored and Timeout performed Patient Re-evaluated:Patient Re-evaluated prior to induction Oxygen Delivery Method: Circle system utilized Preoxygenation: Pre-oxygenation with 100% oxygen Induction Type: IV induction Ventilation: Mask ventilation without difficulty Laryngoscope Size: Mac and 4 Grade View: Grade I Tube type: Oral Tube size: 7.5 mm Number of attempts: 1 Airway Equipment and Method: Stylet Placement Confirmation: ETT inserted through vocal cords under direct vision,  positive ETCO2,  CO2 detector and breath sounds checked- equal and bilateral Secured at: 22 cm Tube secured with: Tape Dental Injury: Teeth and Oropharynx as per pre-operative assessment

## 2017-02-21 NOTE — Discharge Instructions (Signed)

## 2017-02-21 NOTE — Brief Op Note (Signed)
02/21/2017  11:45 AM  PATIENT:  Trevor [name]  71 y.o. male  PRE-OPERATIVE DIAGNOSIS:  LARGE LEFT RENAL MASS  POST-OPERATIVE DIAGNOSIS:  LARGE LEFT RENAL MASS  PROCEDURE:  Procedure(s): XI ROBOTIC ASSISTED LAPAROSCOPIC NEPHRECTOMY (Left)  SURGEON:  Surgeon(s) and Role:    Alexis Frock, MD - Primary  PHYSICIAN ASSISTANT:   ASSISTANTS: Clemetine Marker PA   ANESTHESIA:   general  EBL:  150 mL   BLOOD ADMINISTERED:none  DRAINS: foely to gravity   LOCAL MEDICATIONS USED:  MARCAINE     SPECIMEN:  Source of Specimen:  left radical nephrectomy  DISPOSITION OF SPECIMEN:  PATHOLOGY  COUNTS:  YES  TOURNIQUET:  * No tourniquets in log *  DICTATION: .Other Dictation: Dictation Number [id number]  PLAN OF CARE: Admit to inpatient   PATIENT DISPOSITION:  PACU - hemodynamically stable.   Delay start of Pharmacological VTE agent (>24hrs) due to surgical blood loss or risk of bleeding: yes

## 2017-02-21 NOTE — Anesthesia Procedure Notes (Signed)
Arterial Line Insertion Start/End12/07/2016 7:55 AM, 02/21/2017 8:05 AM Performed by: Suzette Battiest, MD, Belkis Norbeck, CRNA, attending, CRNA  Preanesthetic checklist: patient identified, IV checked, site marked, risks and benefits discussed, surgical consent, monitors and equipment checked, pre-op evaluation, timeout performed and anesthesia consent Lidocaine 1% used for infiltration Right, radial was placed Catheter size: 20 G Hand hygiene performed  and maximum sterile barriers used  Allen's test indicative of satisfactory collateral circulation Attempts: 1 Procedure performed without using ultrasound guided technique. Following insertion, dressing applied. Post procedure assessment: normal and numbness (Stroke numbness preop. )  Patient tolerated the procedure well with no immediate complications.

## 2017-02-22 LAB — BASIC METABOLIC PANEL
ANION GAP: 6 (ref 5–15)
BUN: 23 mg/dL — ABNORMAL HIGH (ref 6–20)
CHLORIDE: 105 mmol/L (ref 101–111)
CO2: 24 mmol/L (ref 22–32)
Calcium: 8.4 mg/dL — ABNORMAL LOW (ref 8.9–10.3)
Creatinine, Ser: 1.15 mg/dL (ref 0.61–1.24)
GFR calc non Af Amer: >60 mL/min (ref >60)
Glucose, Bld: 104 mg/dL — ABNORMAL HIGH (ref 65–99)
Potassium: 5.7 mmol/L — ABNORMAL HIGH (ref 3.5–5.1)
Sodium: 135 mmol/L (ref 135–145)

## 2017-02-22 LAB — GLUCOSE, CAPILLARY
GLUCOSE-CAPILLARY: 143 mg/dL — AB (ref 65–99)
GLUCOSE-CAPILLARY: 83 mg/dL (ref 65–99)
GLUCOSE-CAPILLARY: 117 mg/dL — AB (ref 65–99)
GLUCOSE-CAPILLARY: 115 mg/dL — AB (ref 65–99)
Glucose-Capillary: 124 mg/dL — ABNORMAL HIGH (ref 65–99)

## 2017-02-22 LAB — HEMOGLOBIN AND HEMATOCRIT, BLOOD
HEMATOCRIT: 27.2 % — AB (ref 39.0–52.0)
Hemoglobin: 8.5 g/dL — ABNORMAL LOW (ref 13.0–17.0)

## 2017-02-22 MED ORDER — ENSURE ENLIVE PO LIQD
237.0000 mL | Freq: Every day | ORAL | Status: DC
  Administered 2017-02-22 – 2017-02-23 (×2): 237 mL via ORAL

## 2017-02-22 NOTE — Op Note (Signed)
NAME:  Trevor [name], Trevor [name]                     ACCOUNT NO.:  MEDICAL RECORD NO.:  [id number]  LOCATION:                                 FACILITY:  PHYSICIAN:  Alexis Frock, MD     DATE OF BIRTH:  12/02/1945  DATE OF PROCEDURE: 02/21/2017                              OPERATIVE REPORT   DIAGNOSIS:  Large left renal mass with symptomatic anemia and hematuria.  PROCEDURE:  Robotic-assisted laparoscopic left radical nephrectomy.  ASSISTANT:  Debbrah Alar, PA  ESTIMATED BLOOD LOSS:  150 mL.  COMPLICATION:  None.  SPECIMEN:  Left radical nephrectomy for permanent pathology.  FINDINGS: 1. Single artery, single vein, left renovascular anatomy. 2. Large left upper pole renal mass as anticipated. 3. Multiple small parasitic vessels in area of mass as expected.  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  Intermittent biopsy of pulmonary nodule, which corroborated likely kidney primary.  He has been on downstaging chemotherapy.  Options were discussed for definitive management including palliative only protocols versus surgical extirpation with goal of maximal disease control and stability of hemodynamics and oncologic benefit and he adamantly wished to proceed. He has cardiac clearance.  His hemoglobin now is greater than 11 and functional status has significantly improved, and he wished to proceed. Informed consent was obtained and placed in the medical record.  PROCEDURE IN DETAIL:  The patient being Trevor [name], was verified. Procedure being left robotic radical nephrectomy was confirmed. Procedure was carried out.  Time-out was performed.  Intravenous antibiotics were administered.  General endotracheal anesthesia was introduced.  The patient was placed into a left side up full  flank position, employing 15 degrees of stable flexion, superior arm elevator, axillary roll, sequential compression devices, bottom leg bent, top leg straight.  He was further fashioned to the operating table using beanbag and 3-inch tape over foam padding across his supraxiphoid chest and his pelvis.  Next, a high-flow, low pressure pneumoperitoneum was obtained using Veress technique in the left lower quadrant having passed the aspiration and drop test.  An 8-mm robotic camera port was then placed in position approximately 4 fingerbreadths superolateral to the umbilicus.  Laparoscopic examination of the peritoneal cavity revealed no significant adhesions and no visceral injury.  Additional ports were then placed as follows:  Left subcostal 8-mm robotic port, left far lateral 8-mm robotic port approximately 3 fingerbreadths superomedial to the anterior superior iliac spine.  Left inferior paramedian robotic port approximately 1 handbreadth superior to the pubic ramus, two 12-mm assistant port sites in the midline 1-2 fingerbreadths above the camera port and one in the infraumbilical crease, the superior one being an AirSeal-type port.  Robot was docked and passed through the electronic checks.  Initial attention was directed at development of the left retroperitoneum.  Incision was made lateral to the descending colon from the area of the splenic flexure towards the area of the internal ring, and the colon was carefully swept medially.  There was some desmoplastic reaction  and mild edematous changes between the bowel mesentery and anterior surface of Gerota's fascia as anticipated, but there was no obvious locally-advanced disease in this location.  Lower pole of the kidney area was identified, placed on gentle lateral traction. Dissection proceeded medial to this.  The ureter and gonadal vessels were identified, placed on gentle lateral traction.  Psoas muscle was identified,  dissection proceeded within this triangle superiorly towards the area of the renal hilum.  The anterior surface of the Gerota's kidney was further mobilized away from the posterior aspect of the pancreas and the hilum was encountered, consisted of single artery, single vein renovascular anatomy with several small parasitic vessels in the area.  Artery was controlled using extra-large Hem-O-Lok clip proximally followed by vascular load stapler distally and the vein controlled using vascular load stapler, which resulted in excellent hemostatic hilar control.  Additional staple loads were used to separate the medial adrenal attachments from the area of radical nephrectomy and superior attachments away from the adrenal gland.  The superior attachments between the mass and the diaphragm were very carefully dissected using cautery dissection, taken exquisite care to avoid diaphragmatic injury, which did not occur grossly.  This also avoided splenic injury.  Posterior dissection was then performed.  The ureter was doubly clipped and ligated.  The gonadal vessels were controlled using vascular load stapler, this completely freed up the left radical nephrectomy specimen, which was placed in extra-large endoscopic retrieval bag.  The robot was then undocked.  Specimen was retrieved by extending the previous assistant port sites in the midline and removing the left radical nephrectomy specimen, setting aside for permanent pathology.  Hemostasis appeared excellent.  All sponge and needle counts were correct.  The extraction site was closed at the level of the fascia using figure-of-eight PDS x9 followed by reapproximation of Scarpa's with running Vicryl.  All incision sites were infiltrated with dilute lyophilized Marcaine and closed at the level of the skin using subcuticular Monocryl followed by Dermabond.  Procedure was then terminated.  The patient tolerated the procedure well.  There were  no immediate periprocedural complications.  The patient was taken to the postanesthesia care unit in stable condition.  Please note, first assistant, Debbrah Alar, was absolutely crucial for all robotic portions of the procedure today.  She provided invaluable retraction, clip application, vascular stapling, suctioning; without which, this would not be possible.          ______________________________ Alexis Frock, MD     TM/MEDQ  D:  02/21/2017  T:  02/22/2017  Job:  [id number]

## 2017-02-22 NOTE — Progress Notes (Signed)
Nutrition Brief Note  Patient identified on the Malnutrition Screening Tool (MST) Report  Wt Readings from Last 15 Encounters:  02/21/17 158 lb (71.7 kg)  02/19/17 158 lb 4 oz (71.8 kg)  02/05/17 162 lb 14.4 oz (73.9 kg)  01/30/17 166 lb 3.2 oz (75.4 kg)  01/15/17 167 lb 6.4 oz (75.9 kg)  01/09/17 169 lb (76.7 kg)  01/08/17 164 lb 12.8 oz (74.8 kg)  12/28/16 167 lb 11.2 oz (76.1 kg)  12/26/16 166 lb (75.3 kg)  12/06/16 167 lb 9.6 oz (76 kg)  11/21/16 174 lb 4.8 oz (79.1 kg)  11/16/16 178 lb 5.6 oz (80.9 kg)  05/23/16 178 lb 6.4 oz (80.9 kg)  11/23/15 177 lb 3.2 oz (80.4 kg)  11/09/15 174 lb 11.2 oz (79.2 kg)    Body mass index is 22.04 kg/m. Patient meets criteria for normal weight based on current BMI. Pt has lost 8 lbs (4.8% body weight) in the past 1 month which is borderline significant. Pt with large L renal mass s/p lap nephrectomy 12/5. Dr. Zettie Pho note from today states plan for d/c tomorrow.   Current diet order is Regular and patient consumed 100% of breakfast this AM (640 kcal, 26 grams of protein).  Medications reviewed; 325 mg ferrous sulfate/day, sliding scale Novolog,40 mg oral Protonix/day.  Labs reviewed; CBGs: 83 and 124 mg/dL, K: 5.7 mmol/L, BUN: 23 mg/dL, Ca: 8.4 mg/dL.   Pt is currently ordered Boost Breeze TID. Will change to Ensure Enlive once/day, this supplement provides 350 kcal and 20 grams of protein. No further nutrition interventions warranted at this time. If nutrition issues arise, please consult RD.      Jarome Matin, MS, RD, LDN, [facility] Inpatient Clinical Dietitian Pager # [phone] After hours/weekend pager # [phone]

## 2017-02-22 NOTE — Progress Notes (Signed)
Urology Progress Note   1 Day Post-Op s/p left robotic nx   Subjective: NAEON. Pain controlled. Ambulating. Tolerating clears   Objective: Vital signs in last 24 hours: Temp:  [97.5 F (36.4 C)-98.2 F (36.8 C)] 97.7 F (36.5 C) (12/06 4098) Pulse Rate:  [61-92] 61 (12/06 0613) Resp:  [4-19] 17 (12/06 0613) BP: (91-133)/(44-95) 113/65 (12/06 0613) SpO2:  [96 %-100 %] 100 % (12/06 1191) Arterial Line BP: (102-143)/(56-72) 130/68 (12/05 1348)  Intake/Output from previous day: 12/05 0701 - 12/06 0700 In: 3350 [I.V.:3100; IV Piggyback:250] Out: 650 [Urine:500; Blood:150] Intake/Output this shift: No intake/output data recorded.  Physical Exam:  General: Alert and oriented CV: RRR Lungs: Clear Abdomen: Soft, appropriately tender. Incisions c/d/i GU: Foley in place draining clear yellow urine  Ext: NT, No erythema  Lab Results: Recent Labs    02/19/17 0859 02/21/17 1147 02/22/17 0421  HGB 11.2* 9.4* 8.5*  HCT 34.5* 29.8* 27.2*   BMET Recent Labs    02/19/17 0859 02/22/17 0421  NA 137 135  K 4.8 5.7*  CL 105 105  CO2 23 24  GLUCOSE 120* 104*  BUN 32* 23*  CREATININE 1.65* 1.15  CALCIUM 8.8* 8.4*     Studies/Results: No results found.  Assessment/Plan:  71 y.o. male s/p Left Robotic Nx.  Overall doing well post-op.   - Regular diet - DC foley  - Saline Lock  - Ambulate  - Titrate pain medication - Repeat labs tomorrow am - Follow vital signs given decrease in Hgb and hyperkalemia    Dispo: Tomorrow, 12/7   LOS: 1 day   Alla Feeling, MD 02/22/2017, 8:42 AM   I have seen and examined the patient and agree with above assesment and plan.   Briefly,  S: POD 1 s/p left radical neprectomy for large renal cancer. Pain controlled, tolerating PO.  O: NAD, AOx3 Non-labored breathing SNTND Recent surgical sites c/d/i. Catheter removed SCD's in place w/o c/c/e  Hgb 8's (long h/o anemia) Cr <1.5  A/P: Doing well POD 1. Rec recheck AM  labs tomorrow and likely DC tomorrow as long as Hgb acceptable.

## 2017-02-23 LAB — TYPE AND SCREEN
ABO/RH(D): O POS
ANTIBODY SCREEN: NEGATIVE
UNIT DIVISION: 0
UNIT DIVISION: 0
Unit division: 0
Unit division: 0

## 2017-02-23 LAB — BASIC METABOLIC PANEL
Anion gap: 5 (ref 5–15)
BUN: 21 mg/dL — AB (ref 6–20)
CHLORIDE: 108 mmol/L (ref 101–111)
CO2: 26 mmol/L (ref 22–32)
CREATININE: 1.24 mg/dL (ref 0.61–1.24)
Calcium: 8.9 mg/dL (ref 8.9–10.3)
GFR calc Af Amer: >60 mL/min (ref >60)
GFR calc non Af Amer: 57 mL/min — ABNORMAL LOW (ref >60)
GLUCOSE: 100 mg/dL — AB (ref 65–99)
POTASSIUM: 5.7 mmol/L — AB (ref 3.5–5.1)
SODIUM: 139 mmol/L (ref 135–145)

## 2017-02-23 LAB — BPAM RBC
BLOOD PRODUCT EXPIRATION DATE: 201812262359
Blood Product Expiration Date: 201812252359
Blood Product Expiration Date: 201812262359
Blood Product Expiration Date: 201812262359
UNIT TYPE AND RH: 5100
UNIT TYPE AND RH: 5100
Unit Type and Rh: 5100
Unit Type and Rh: 5100

## 2017-02-23 LAB — HEMOGLOBIN AND HEMATOCRIT, BLOOD
HEMATOCRIT: 29 % — AB (ref 39.0–52.0)
Hemoglobin: 9.2 g/dL — ABNORMAL LOW (ref 13.0–17.0)

## 2017-02-23 LAB — GLUCOSE, CAPILLARY: GLUCOSE-CAPILLARY: 85 mg/dL (ref 65–99)

## 2017-02-23 MED ORDER — SENNOSIDES-DOCUSATE SODIUM 8.6-50 MG PO TABS: 1.0000 | ORAL_TABLET | Freq: Two times a day (BID) | ORAL | 0 refills | Status: DC

## 2017-02-23 NOTE — Discharge Summary (Signed)
Physician Discharge Summary  Patient ID: Trevor [name] MRN: [id number] DOB/AGE: 09/14/1945 71 y.o.  Admit date: 02/21/2017 Discharge date: 02/23/2017  Admission Diagnoses: Left Renal Mass  Discharge Diagnoses:  Active Problems:   Renal mass   Discharged Condition: good  Hospital Course:   Pt underwent LEFT robotic radical nephrectomy on 02/21/17, the day of admission, without acute complication. He has h/o anemia from hematuria / anemia of chronic disease from this. He was admitted to 4th floor Urology service post-op where he began his vigorous recovery. Diet advanced to regular POD 1 and became ambulatory. By the AM of POD 2 is hgb is stable (9.2), Cr acceptable (1.2), and felt to be adequate for discharge. Final pathology pending at discharge.   Consults: None  Significant Diagnostic Studies: labs: as per above  Treatments: surgery: as per above  Discharge Exam: Blood pressure 126/63, pulse 61, temperature 97.9 F (36.6 C), temperature source Oral, resp. rate 18, height 5\' 11"  (1.803 m), weight 71.7 kg (158 lb), SpO2 100 %. General appearance: alert, cooperative and appears stated age Eyes: negative Nose: Nares normal. Septum midline. Mucosa normal. No drainage or sinus tenderness. Throat: lips, mucosa, and tongue normal; teeth and gums normal Neck: supple, symmetrical, trachea midline Back: symmetric, no curvature. ROM normal. No CVA tenderness. Resp: wheezes (none) Cardio: Nl rate GI: soft, non-tender; bowel sounds normal; no masses,  no organomegaly Male genitalia: normal Extremities: extremities normal, atraumatic, no cyanosis or edema Pulses: 2+ and symmetric Skin: Skin color, texture, turgor normal. No rashes or lesions Lymph nodes: Cervical, supraclavicular, and axillary nodes normal. Neurologic: Grossly normal  Recent surgical sites c/d/i.   Disposition: 01-Home or Self Care   Allergies as of 02/23/2017   No Known Allergies     Medication List    STOP  taking these medications   aspirin EC 81 MG tablet   naproxen sodium 220 MG tablet Commonly known as:  ALEVE     TAKE these medications   atorvastatin 40 MG tablet Commonly known as:  LIPITOR Take 1 tablet (40 mg total) by mouth daily.   cabozantinib S-Malate 60 MG Tabs Take 60 mg by mouth daily. Take on an empty stomach 1 hour before or 2 hours after a meal.   carvedilol 3.125 MG tablet Commonly known as:  COREG Take 1 tablet (3.125 mg total) by mouth 2 (two) times daily.   ferrous sulfate 325 (65 FE) MG tablet Take 325 mg by mouth daily with breakfast.   HYDROcodone-acetaminophen 5-325 MG tablet Commonly known as:  NORCO Take 1-2 tablets by mouth every 6 (six) hours as needed for moderate pain or severe pain.   lisinopril 5 MG tablet Commonly known as:  PRINIVIL,ZESTRIL Take 1 tablet (5 mg total) daily by mouth.   metFORMIN 1000 MG tablet Commonly known as:  GLUCOPHAGE Take 1,000 mg by mouth 2 (two) times daily.   pantoprazole 40 MG tablet Commonly known as:  PROTONIX Take 1 tablet (40 mg total) by mouth at bedtime.   senna-docusate 8.6-50 MG tablet Commonly known as:  Senokot-S Take 1 tablet by mouth 2 (two) times daily. While taking strong pain meds to prevent constipation.      Follow-up Information    Alexis Frock, MD Follow up on 03/08/2017.   Specialty:  Urology Why:  at 2 AM for MD visit. Dr. Tresa Moore will call you with pathology results when available.  Contact information: [address] [place] [address] [phone]  SignedAlexis Frock 02/23/2017, 7:10 AM

## 2017-02-23 NOTE — Progress Notes (Signed)
   02/23/17 1000  What Happened  Was fall witnessed? No  Was patient injured? Yes  Patient found on floor  Found by Staff-comment (Autumn RN)  Stated prior activity ambulating-unassisted  Follow Up  MD notified Dr Tresa Moore  Time MD notified [phone]  Family notified No- patient refusal  Additional tests No  Simple treatment Other (comment)  Progress note created (see row info) (bandage)  Adult Fall Risk Assessment  Risk Factor Category (scoring not indicated) Fall has occurred during this admission (document High fall risk)  Patient's Fall Risk High Fall Risk (>13 points)  Adult Fall Risk Interventions  Required Bundle Interventions *See Row Information* High fall risk - low, moderate, and high requirements implemented  Additional Interventions Use of appropriate toileting equipment (bedpan, BSC, etc.);Reorient/diversional activities with confused patients  Screening for Fall Injury Risk  Risk For Fall Injury- See Row Information  None identified  Vitals  Temp 97.7 F (36.5 C)  Temp Source Oral  BP 140/77  BP Location Left Arm  BP Method Automatic  Patient Position (if appropriate) Sitting  Pulse Rate 94  Pulse Rate Source Dinamap  Resp 20  Oxygen Therapy  SpO2 100 %  O2 Device Room Air  Pain Assessment  Pain Assessment 0-10  Pain Score 0  PCA/Epidural/Spinal Assessment  Respiratory Pattern Regular;Unlabored  Neurological  Neuro (WDL) WDL  Level of Consciousness Alert  Orientation Level Oriented X4  Musculoskeletal  Musculoskeletal (WDL) WDL  Integumentary  Integumentary (WDL) X  Skin Color Appropriate for ethnicity  Skin Condition Dry  Skin Integrity Abrasion  Abrasion Location Head  Abrasion Location Orientation Mid  Abrasion Intervention Cleansed;Other (Comment) (steri strips)   Pt was d/c to home. After RN left room pt got up to use bathroom due to gauze at old IV site starting to get blood coming through.  After getting up pt tripped and fell to floor.  Pt  forehead hit door.  Two skin tears present on forehead and bridge of nose.  Areas cleansed and steri-strips applied.  MD paged. Pt states he is fine and not in pain, wanting to still go home.  MD okay with continuing d/c to home.

## 2017-02-27 ENCOUNTER — Ambulatory Visit: Payer: Medicare Other | Admitting: Cardiovascular Disease

## 2017-03-05 ENCOUNTER — Other Ambulatory Visit: Payer: Self-pay

## 2017-03-05 NOTE — Patient Outreach (Signed)
Telephone outreach to patient to obtain mRs was successfully completed. mRs= 1. 

## 2017-03-06 ENCOUNTER — Telehealth: Payer: Self-pay | Admitting: Cardiovascular Disease

## 2017-03-06 NOTE — Telephone Encounter (Signed)
Recommendation:  1. HOLD lisinopril for 3 days 2. Stay hydrated 3. STOP q2hrs BP readings; monitor no more that 3x/day

## 2017-03-06 NOTE — Telephone Encounter (Signed)
Returned call. Recommendations discussed in detail with patient, who verbalized understanding and thanks. I advised to call at the end of the week if he is not feeling better after increased hydration and med hold. He will check BP in AM x1, midday x1 and PM x1.

## 2017-03-06 NOTE — Telephone Encounter (Signed)
Pt notes the BP readings as recorded. States these are just readings from today, ~Q2H readings.  Notes BP has been running low x several days.  HR varies between 80s and 110s.   States he feels weak, but has been weak since his recent kidney operation.  Denies SOB, chest pain, or other symptoms.  Confirmed med list, he takes low dose lisinopril (5mg  daily), carvedilol 3.125mg  BID  Informed pt would route to pharmD for recommendation on medication regimen, likely would cut lisinopril and follow BP - he will await phone call back

## 2017-03-06 NOTE — Telephone Encounter (Signed)
New message   Pt wife verbalized that she want rn to call  her husband to go over his medications  Pt c/o BP issue: STAT if pt c/o blurred vision, one-sided weakness or slurred speech  1. What are your last 5 BP readings? 66/42  86/57  79/59  74/50 2. Are you having any other symptoms (ex. Dizziness, headache, blurred vision, passed out)? no 3. What is your BP issue? Pt weak

## 2017-03-22 ENCOUNTER — Other Ambulatory Visit (HOSPITAL_BASED_OUTPATIENT_CLINIC_OR_DEPARTMENT_OTHER): Payer: Medicare Other

## 2017-03-22 ENCOUNTER — Encounter: Payer: Self-pay | Admitting: Oncology

## 2017-03-22 ENCOUNTER — Telehealth: Payer: Self-pay | Admitting: Oncology

## 2017-03-22 ENCOUNTER — Inpatient Hospital Stay: Payer: Medicare Other | Attending: Oncology | Admitting: Oncology

## 2017-03-22 VITALS — BP 128/76 | HR 87 | Temp 98.6°F | Resp 18 | Ht 71.0 in | Wt 157.2 lb

## 2017-03-22 DIAGNOSIS — Z7984 Long term (current) use of oral hypoglycemic drugs: Secondary | ICD-10-CM | POA: Insufficient documentation

## 2017-03-22 DIAGNOSIS — E875 Hyperkalemia: Secondary | ICD-10-CM | POA: Diagnosis not present

## 2017-03-22 DIAGNOSIS — Z5111 Encounter for antineoplastic chemotherapy: Secondary | ICD-10-CM

## 2017-03-22 DIAGNOSIS — I2510 Atherosclerotic heart disease of native coronary artery without angina pectoris: Secondary | ICD-10-CM | POA: Insufficient documentation

## 2017-03-22 DIAGNOSIS — Z79899 Other long term (current) drug therapy: Secondary | ICD-10-CM | POA: Insufficient documentation

## 2017-03-22 DIAGNOSIS — I340 Nonrheumatic mitral (valve) insufficiency: Secondary | ICD-10-CM | POA: Insufficient documentation

## 2017-03-22 DIAGNOSIS — R350 Frequency of micturition: Secondary | ICD-10-CM | POA: Insufficient documentation

## 2017-03-22 DIAGNOSIS — M129 Arthropathy, unspecified: Secondary | ICD-10-CM | POA: Insufficient documentation

## 2017-03-22 DIAGNOSIS — I255 Ischemic cardiomyopathy: Secondary | ICD-10-CM | POA: Insufficient documentation

## 2017-03-22 DIAGNOSIS — R531 Weakness: Secondary | ICD-10-CM | POA: Insufficient documentation

## 2017-03-22 DIAGNOSIS — D649 Anemia, unspecified: Secondary | ICD-10-CM | POA: Insufficient documentation

## 2017-03-22 DIAGNOSIS — C642 Malignant neoplasm of left kidney, except renal pelvis: Secondary | ICD-10-CM | POA: Diagnosis not present

## 2017-03-22 DIAGNOSIS — Z8673 Personal history of transient ischemic attack (TIA), and cerebral infarction without residual deficits: Secondary | ICD-10-CM | POA: Insufficient documentation

## 2017-03-22 DIAGNOSIS — E119 Type 2 diabetes mellitus without complications: Secondary | ICD-10-CM | POA: Insufficient documentation

## 2017-03-22 DIAGNOSIS — I252 Old myocardial infarction: Secondary | ICD-10-CM | POA: Insufficient documentation

## 2017-03-22 DIAGNOSIS — R197 Diarrhea, unspecified: Secondary | ICD-10-CM | POA: Insufficient documentation

## 2017-03-22 DIAGNOSIS — R5383 Other fatigue: Secondary | ICD-10-CM

## 2017-03-22 DIAGNOSIS — I739 Peripheral vascular disease, unspecified: Secondary | ICD-10-CM | POA: Insufficient documentation

## 2017-03-22 DIAGNOSIS — I959 Hypotension, unspecified: Secondary | ICD-10-CM | POA: Insufficient documentation

## 2017-03-22 DIAGNOSIS — M1990 Unspecified osteoarthritis, unspecified site: Secondary | ICD-10-CM | POA: Insufficient documentation

## 2017-03-22 DIAGNOSIS — R112 Nausea with vomiting, unspecified: Secondary | ICD-10-CM | POA: Insufficient documentation

## 2017-03-22 DIAGNOSIS — E785 Hyperlipidemia, unspecified: Secondary | ICD-10-CM | POA: Insufficient documentation

## 2017-03-22 DIAGNOSIS — R0602 Shortness of breath: Secondary | ICD-10-CM | POA: Insufficient documentation

## 2017-03-22 DIAGNOSIS — E860 Dehydration: Secondary | ICD-10-CM | POA: Insufficient documentation

## 2017-03-22 LAB — CBC WITH DIFFERENTIAL/PLATELET
BASO%: 0.3 % (ref 0.0–2.0)
Basophils Absolute: 0 10*3/uL (ref 0.0–0.1)
EOS%: 21.1 % — AB (ref 0.0–7.0)
Eosinophils Absolute: 1.8 10*3/uL — ABNORMAL HIGH (ref 0.0–0.5)
HEMATOCRIT: 32.1 % — AB (ref 38.4–49.9)
HEMOGLOBIN: 10.5 g/dL — AB (ref 13.0–17.1)
LYMPH#: 1.3 10*3/uL (ref 0.9–3.3)
LYMPH%: 15.4 % (ref 14.0–49.0)
MCH: 27.8 pg (ref 27.2–33.4)
MCHC: 32.6 g/dL (ref 32.0–36.0)
MCV: 85.2 fL (ref 79.3–98.0)
MONO#: 0.6 10*3/uL (ref 0.1–0.9)
MONO%: 6.9 % (ref 0.0–14.0)
NEUT#: 4.9 10*3/uL (ref 1.5–6.5)
NEUT%: 56.3 % (ref 39.0–75.0)
Platelets: 183 10*3/uL (ref 140–400)
RBC: 3.77 10*6/uL — ABNORMAL LOW (ref 4.20–5.82)
RDW: 24.3 % — AB (ref 11.0–14.6)
WBC: 8.7 10*3/uL (ref 4.0–10.3)

## 2017-03-22 LAB — COMPREHENSIVE METABOLIC PANEL
ALBUMIN: 3.7 g/dL (ref 3.5–5.0)
ALK PHOS: 103 U/L (ref 40–150)
ALT: 13 U/L (ref 0–55)
ANION GAP: 9 meq/L (ref 3–11)
AST: 15 U/L (ref 5–34)
BILIRUBIN TOTAL: 0.41 mg/dL (ref 0.20–1.20)
BUN: 18.1 mg/dL (ref 7.0–26.0)
CALCIUM: 9.8 mg/dL (ref 8.4–10.4)
CO2: 27 meq/L (ref 22–29)
Chloride: 105 mEq/L (ref 98–109)
Creatinine: 1.2 mg/dL (ref 0.7–1.3)
EGFR: 59 mL/min/{1.73_m2} — AB (ref >60)
Glucose: 95 mg/dl (ref 70–140)
Potassium: 5.5 mEq/L — ABNORMAL HIGH (ref 3.5–5.1)
Sodium: 141 mEq/L (ref 136–145)
TOTAL PROTEIN: 6.7 g/dL (ref 6.4–8.3)

## 2017-03-22 MED FILL — CABOMETYX 60 MG TABLET: 60 | 30 days supply | Qty: 30 | Fill #1

## 2017-03-22 NOTE — Assessment & Plan Note (Signed)
This is a pleasant 72 year old gentleman with [name].The patient presented with an11 cm left kidney mass and pulmonary nodules.  He is status post 1 month of treatment with Cabometyx 60 mg daily.  This was stopped approximately 1 month ago in preparation for his nephrectomy.  The patient had a left nephrectomy which he tolerated well and he is recovering well from his surgery.  The patient was seen with Dr. Julien Nordmann.  Recommend that he resume his treatment with Cabometyx 60 mg daily.   The patient will return in approximately 1 month after his surgery for evaluation and blood work.  We will see the patient back in 2-3 weeks for repeat lab work and evaluation of side effects of treatment.  Potassium level slightly elevated today.  We discussed avoiding potassium rich foods.  Will recheck this with his next visit.  All the patient's questions were answered to his satisfaction today he was instructed to call us if he has any questions or concerns in the interim.

## 2017-03-22 NOTE — Telephone Encounter (Signed)
Gave avs and calendar for January  °

## 2017-03-22 NOTE — Progress Notes (Signed)
Trevor [name] OFFICE PROGRESS NOTE  Via, [name], MD [place] [address]  DIAGNOSIS: [name].The patient presented with an11 cm left kidney mass and pulmonary nodules.  PRIOR THERAPY: Status post left nephrectomy on 02/21/2017.  CURRENT THERAPY: Cabometyx 60 mg daily. Started 01/21/2017.  Status post 1 month of treatment.  Treatment was then placed on hold for nephrectomy.  Resuming treatment on 03/22/2017.  INTERVAL HISTORY: Trevor [name] 72 y.o. male returns for routine follow-up visit by himself.  The patient had surgery approximately 1 month ago and tolerated it quite well.  He is gaining back his strength and his appetite is improving.  He is gaining back some of his lost weight.  The patient denies fevers and chills.  Denies chest pain, shortness breath, cough, hemoptysis.  Denies nausea, vomiting, constipation, diarrhea.  The patient has been off Cabometyx since 1 week prior to his surgery.  The patient is here for repeat evaluation and blood work.  MEDICAL HISTORY: Past Medical History:  Diagnosis Date  . 3-vessel coronary artery disease   . Anemia    2 iron infusions  . Arthritis   . Blood in urine    blood clots noted since biopsy  . Cancer ([place])    Left kidney cancer  . Carotid disease, bilateral ([facility])    moderate  . Coronary artery disease    06/2012: Anterior ST elevation myocardial infarction. Cardiac catheterization showed significant three-vessel coronary artery disease, ejection fraction 35-40%. The patient underwent CABG with LIMA to LAD, SVG to ramus and sequential SVG to right PDA/PL  . Diabetes mellitus without complication ([facility])   . Diarrhea   . Dyspnea    mild  . Ejection fraction < 50%   . Frequent urination    comes and goes since biospy  . History of blood transfusion   . Hyperlipidemia   . Ischemic cardiomyopathy    Ejection fraction of 35-40% initially, 45 - 50% on echo 2014, 53% by  perfusion study 2016  . Mild mitral regurgitation   . Non-ST elevated myocardial infarction (non-STEMI) ([facility]) 10/2016  . Numbness    Right side since stroke  . OA (osteoarthritis)   . PAD (peripheral artery disease) ([facility])   . Pulmonary nodule   . STEMI (ST elevation myocardial infarction) ([facility]) 06/2012   anterior   . Stroke ([facility]) 2018  . TIA (transient ischemic attack) 10/2016  . Weakness generalized   . Wears glasses     ALLERGIES:  has No Known Allergies.  MEDICATIONS:  Current Outpatient Medications  Medication Sig Dispense Refill  . atorvastatin (LIPITOR) 40 MG tablet Take 1 tablet (40 mg total) by mouth daily. 30 tablet 6  . cabozantinib S-Malate 60 MG TABS Take 60 mg by mouth daily. Take on an empty stomach 1 hour before or 2 hours after a meal. 30 tablet 2  . carvedilol (COREG) 3.125 MG tablet Take 1 tablet (3.125 mg total) by mouth 2 (two) times daily. 180 tablet 2  . ferrous sulfate 325 (65 FE) MG tablet Take 325 mg by mouth daily with breakfast.    . HYDROcodone-acetaminophen (NORCO) 5-325 MG tablet Take 1-2 tablets by mouth every 6 (six) hours as needed for moderate pain or severe pain. 30 tablet 0  . lisinopril (PRINIVIL,ZESTRIL) 5 MG tablet Take 1 tablet (5 mg total) daily by mouth. 90 tablet 3  . metFORMIN (GLUCOPHAGE) 1000 MG tablet Take 1,000 mg by mouth 2 (two) times daily.  0  . pantoprazole (PROTONIX) 40 MG tablet Take 1 tablet (40 mg total) by mouth at bedtime. 30 tablet 0  . senna-docusate (SENOKOT-S) 8.6-50 MG tablet Take 1 tablet by mouth 2 (two) times daily. While taking strong pain meds to prevent constipation. 20 tablet 0   No current facility-administered medications for this visit.     SURGICAL HISTORY:  Past Surgical History:  Procedure Laterality Date  . CARDIOVASCULAR STRESS TEST    . CORONARY ARTERY BYPASS GRAFT N/A 07/05/2012   Procedure: CORONARY ARTERY BYPASS GRAFTING (CABG);  Surgeon: Ivin Poot, MD;  Location: [facility];  Service: Open  Heart Surgery;  Laterality: N/A;  . INTRAOPERATIVE TRANSESOPHAGEAL ECHOCARDIOGRAM N/A 07/05/2012   Procedure: INTRAOPERATIVE TRANSESOPHAGEAL ECHOCARDIOGRAM;  Surgeon: Ivin Poot, MD;  Location: [facility];  Service: Open Heart Surgery;  Laterality: N/A;  . LEFT HEART CATH AND CORS/GRAFTS ANGIOGRAPHY N/A 11/14/2016   Procedure: LEFT HEART CATH AND CORS/GRAFTS ANGIOGRAPHY;  Surgeon: Belva Crome, MD;  Location: [facility];  Service: Cardiovascular;  Laterality: N/A;  . LEFT HEART CATHETERIZATION WITH CORONARY ANGIOGRAM N/A 07/04/2012   Procedure: LEFT HEART CATHETERIZATION WITH CORONARY ANGIOGRAM;  Surgeon: Wellington Hampshire, MD;  Location: [facility];  Service: Cardiovascular;  Laterality: N/A;  . RENAL BIOPSY    . ROBOT ASSISTED LAPAROSCOPIC NEPHRECTOMY Left 02/21/2017   Procedure: XI ROBOTIC ASSISTED LAPAROSCOPIC NEPHRECTOMY;  Surgeon: Alexis Frock, MD;  Location: [facility];  Service: Urology;  Laterality: Left;    REVIEW OF SYSTEMS:   Review of Systems  Constitutional: Negative for appetite change, chills, fatigue, fever and unexpected weight change.  HENT:   Negative for mouth sores, nosebleeds, sore throat and trouble swallowing.   Eyes: Negative for eye problems and icterus.  Respiratory: Negative for cough, hemoptysis, shortness of breath and wheezing.   Cardiovascular: Negative for chest pain and leg swelling.  Gastrointestinal: Negative for abdominal pain, constipation, diarrhea, nausea and vomiting.  Genitourinary: Negative for bladder incontinence, difficulty urinating, dysuria, frequency and hematuria.   Musculoskeletal: Negative for back pain, gait problem, neck pain and neck stiffness.  Skin: Negative for itching and rash.  Neurological: Negative for dizziness, extremity weakness, gait problem, headaches, light-headedness and seizures.  Hematological: Negative for adenopathy. Does not bruise/bleed easily.  Psychiatric/Behavioral: Negative for confusion, depression and  sleep disturbance. The patient is not nervous/anxious.     PHYSICAL EXAMINATION:  Blood pressure 128/76, pulse 87, temperature 98.6 F (37 C), temperature source Oral, resp. rate 18, height 5\' 11"  (1.803 m), weight 157 lb 3.2 oz (71.3 kg), SpO2 100 %.  ECOG PERFORMANCE STATUS: 1 - Symptomatic but completely ambulatory  Physical Exam  Constitutional: Oriented to person, place, and time and well-developed, well-nourished, and in no distress. No distress.  HENT:  Head: Normocephalic and atraumatic.  Mouth/Throat: Oropharynx is clear and moist. No oropharyngeal exudate.  Eyes: Conjunctivae are normal. Right eye exhibits no discharge. Left eye exhibits no discharge. No scleral icterus.  Neck: Normal range of motion. Neck supple.  Cardiovascular: Normal rate, regular rhythm, normal heart sounds and intact distal pulses.   Pulmonary/Chest: Effort normal and breath sounds normal. No respiratory distress. No wheezes. No rales.  Abdominal: Soft. Bowel sounds are normal. Exhibits no distension and no mass. There is no tenderness.  Musculoskeletal: Normal range of motion. Exhibits no edema.  Lymphadenopathy:    No cervical adenopathy.  Neurological: Alert and oriented to person, place, and time. Exhibits normal muscle tone. Gait normal. Coordination normal.  Skin: Skin is warm  and dry. No rash noted. Not diaphoretic. No erythema. No pallor.  Psychiatric: Mood, memory and judgment normal.  Vitals reviewed.  LABORATORY DATA: Lab Results  Component Value Date   WBC 8.7 03/22/2017   HGB 10.5 (L) 03/22/2017   HCT 32.1 (L) 03/22/2017   MCV 85.2 03/22/2017   PLT 183 03/22/2017      Chemistry      Component Value Date/Time   NA 141 03/22/2017 0919   K 5.5 (H) 03/22/2017 0919   CL 108 02/23/2017 0405   CO2 27 03/22/2017 0919   BUN 18.1 03/22/2017 0919   CREATININE 1.2 03/22/2017 0919      Component Value Date/Time   CALCIUM 9.8 03/22/2017 0919   ALKPHOS 103 03/22/2017 0919   AST 15  03/22/2017 0919   ALT 13 03/22/2017 0919   BILITOT 0.41 03/22/2017 0919       RADIOGRAPHIC STUDIES:  No results found.  PATHOLOGY:  Diagnosis Kidney, radical nephrectomy for tumor, left kidney - CLEAR CELL RENAL CELL CARCINOMA, FUHRMAN GRADE 3, SPANNING 9 CM. - TUMOR INVADES PERICAPSULAR ADIPOSE. - RESECTION MARGINS ARE NEGATIVE. - BENIGN ADRENAL GLAND. - SEE ONCOLOGY TABLE. Microscopic Comment KIDNEY Specimen, including laterality: Left kidney and adrenal. Procedure: Left radical nephrectomy. Tumor focality: Unifocal. Maximum tumor size (cm): 9 cm. Macroscopic extent of tumor: Tumor invades pericapsular fat. Histologic type: Clear cell renal cell carcinoma. If sarcomatoid features give percentage: N/A. Fuhrman Nuclear Grade: 3 Margins (renal vein, ureter, soft tissue): Negative. Renal vein invasion: No identified. Microscopic tumor extension: Tumor involves pericapsular fat. Adrenal gland: Uninvolved. Lymph nodes: number examined 0 ; number positive 0 TNM code: pT3, pN0 Non-neoplastic kidney: Interstitial fibrosis and chronic inflammation. Comments: None. Vicente Males MD Pathologist, Electronic Signature (Case signed 02/23/2017)   ASSESSMENT/PLAN:  Renal cell carcinoma [facility]) This is a pleasant 72 year old gentleman with [name].The patient presented with an11 cm left kidney mass and pulmonary nodules.  He  is status post 1 month of treatment with Cabometyx 60 mg daily.  This was stopped approximately 1 month ago in preparation for his nephrectomy.  The patient had a left nephrectomy which he tolerated well and he is recovering well from his surgery.  The patient was seen with Dr. Julien Nordmann.  Recommend that he resume his treatment with Cabometyx 60 mg daily.   The patient will return in approximately 1 month after his surgery for evaluation and blood work.  We will see the patient back in 2-3 weeks for repeat lab work and evaluation of  side effects of treatment.  Potassium level slightly elevated today.  We discussed avoiding potassium rich foods.  Will recheck this with his next visit.  All the patient's questions were answered to his satisfaction today he was instructed to call us if he has any questions or concerns in the interim.  Orders Placed This Encounter  Procedures  . CBC with Differential/Platelet    Standing Status:   Future    Standing Expiration Date:   03/22/2018  . Comprehensive metabolic panel    Standing Status:   Future    Standing Expiration Date:   03/22/2018     Mikey Bussing, DNP, AGPCNP-BC, AOCNP 03/22/17  ADDENDUM: Hematology/Oncology Attending: I had a face-to-face encounter with the patient.  I recommended his care plan.  This is a very pleasant 72 years old white male with a stage IV renal cell carcinoma, chromophobe type.  The patient recently underwent left nephrectomy under the care of Dr. Tresa Moore.  The patient was also started on treatment with Carbometyx 60 mg p.o. daily status post 1 months of treatment but this was on hold during his surgical procedure. He is recovering well from his surgery and denied having any concerning complaints except for the fatigue. I recommended for the patient to resume his oral treatment with Carbometyx 60 mg p.o. Daily. We will continue to monitor his blood work closely and the patient will come back for follow-up visit in 2-3 weeks for reevaluation. For the hyperkalemia, we recommended for the patient to decrease any potassium rich diet. He was advised to call immediately if he has any concerning symptoms in the interval.  Disclaimer: This note was dictated with voice recognition software. Similar sounding words can inadvertently be transcribed and may be missed upon review. Eilleen Kempf, MD 03/23/17

## 2017-03-23 ENCOUNTER — Other Ambulatory Visit: Payer: Medicare Other

## 2017-03-23 ENCOUNTER — Ambulatory Visit: Payer: Medicare Other | Admitting: Oncology

## 2017-03-23 ENCOUNTER — Telehealth: Payer: Self-pay | Admitting: Pharmacy Technician

## 2017-03-23 NOTE — Telephone Encounter (Signed)
Oral Oncology Patient Advocate Encounter  Was successful in securing patient a $10,000 grant from [facility] to provide copayment coverage for Cabometyx. This will keep the out of pocket expense at $0.   I have spoken with the patient..   The billing information is as follows and has been shared with [facility].   Member ID: [id number] Group ID: [id number] RxBin: [id number] Dates of Eligibility: 02/20/17 through 02/19/18  Trevor [name]. [name], [name] [phone] 03/23/2017 9:24 AM

## 2017-04-09 ENCOUNTER — Telehealth: Payer: Self-pay | Admitting: Medical Oncology

## 2017-04-09 ENCOUNTER — Telehealth: Payer: Self-pay | Admitting: Pharmacist

## 2017-04-09 NOTE — Telephone Encounter (Signed)
Oral Oncology Pharmacist Encounter  Received call from [facility] that patient has reported the following Cabomytex 60mg  daily side effects:  Moderate diarrhea- patient has been taking loperamide once a day for multiple episodes of diarrhea. Patient re-counseled on loperamide dosing instructions.  Moderate fatigue that is affecting his abilities to perform ADLs.  Patient with OV scheduled for this Wednesday 04/11/17 with labs. Patient knows to call the office with further questions or concerns.  Oral Oncology Clinic will continue to follow.  Johny Drilling, PharmD, BCPS, BCOP 04/09/2017 12:06 PM Oral Oncology Clinic [phone]

## 2017-04-09 NOTE — Telephone Encounter (Signed)
Low BP ( 84/61 and 85/58)  today and watery diarrhea. For 3-4 days he is having 4-5 watery stools. Today he had 5 small amount of watery stool then at 12 he took 2 imodium-he has gone a little bit since. I told him he can take imodium up to 6 tablets /day. Discussed with [facility].

## 2017-04-11 ENCOUNTER — Other Ambulatory Visit: Payer: Self-pay | Admitting: Oncology

## 2017-04-11 ENCOUNTER — Inpatient Hospital Stay (HOSPITAL_BASED_OUTPATIENT_CLINIC_OR_DEPARTMENT_OTHER): Payer: Medicare Other | Admitting: Oncology

## 2017-04-11 ENCOUNTER — Other Ambulatory Visit: Payer: Self-pay

## 2017-04-11 ENCOUNTER — Inpatient Hospital Stay: Payer: Medicare Other

## 2017-04-11 ENCOUNTER — Telehealth: Payer: Self-pay | Admitting: Oncology

## 2017-04-11 ENCOUNTER — Other Ambulatory Visit: Payer: Self-pay | Admitting: Pharmacist

## 2017-04-11 ENCOUNTER — Encounter: Payer: Self-pay | Admitting: Oncology

## 2017-04-11 VITALS — BP 78/62 | HR 110 | Temp 97.7°F | Resp 18 | Ht 71.0 in | Wt 148.7 lb

## 2017-04-11 DIAGNOSIS — C642 Malignant neoplasm of left kidney, except renal pelvis: Secondary | ICD-10-CM | POA: Diagnosis not present

## 2017-04-11 DIAGNOSIS — I252 Old myocardial infarction: Secondary | ICD-10-CM

## 2017-04-11 DIAGNOSIS — M1990 Unspecified osteoarthritis, unspecified site: Secondary | ICD-10-CM

## 2017-04-11 DIAGNOSIS — D649 Anemia, unspecified: Secondary | ICD-10-CM

## 2017-04-11 DIAGNOSIS — E785 Hyperlipidemia, unspecified: Secondary | ICD-10-CM

## 2017-04-11 DIAGNOSIS — R531 Weakness: Secondary | ICD-10-CM | POA: Diagnosis not present

## 2017-04-11 DIAGNOSIS — I739 Peripheral vascular disease, unspecified: Secondary | ICD-10-CM | POA: Diagnosis not present

## 2017-04-11 DIAGNOSIS — I340 Nonrheumatic mitral (valve) insufficiency: Secondary | ICD-10-CM | POA: Diagnosis not present

## 2017-04-11 DIAGNOSIS — R945 Abnormal results of liver function studies: Secondary | ICD-10-CM

## 2017-04-11 DIAGNOSIS — R7989 Other specified abnormal findings of blood chemistry: Secondary | ICD-10-CM

## 2017-04-11 DIAGNOSIS — E119 Type 2 diabetes mellitus without complications: Secondary | ICD-10-CM

## 2017-04-11 DIAGNOSIS — I255 Ischemic cardiomyopathy: Secondary | ICD-10-CM | POA: Diagnosis not present

## 2017-04-11 DIAGNOSIS — R112 Nausea with vomiting, unspecified: Secondary | ICD-10-CM

## 2017-04-11 DIAGNOSIS — R110 Nausea: Secondary | ICD-10-CM

## 2017-04-11 DIAGNOSIS — R0602 Shortness of breath: Secondary | ICD-10-CM | POA: Diagnosis not present

## 2017-04-11 DIAGNOSIS — M129 Arthropathy, unspecified: Secondary | ICD-10-CM

## 2017-04-11 DIAGNOSIS — Z79899 Other long term (current) drug therapy: Secondary | ICD-10-CM

## 2017-04-11 DIAGNOSIS — R350 Frequency of micturition: Secondary | ICD-10-CM | POA: Diagnosis not present

## 2017-04-11 DIAGNOSIS — E860 Dehydration: Secondary | ICD-10-CM | POA: Diagnosis not present

## 2017-04-11 DIAGNOSIS — Z7984 Long term (current) use of oral hypoglycemic drugs: Secondary | ICD-10-CM

## 2017-04-11 DIAGNOSIS — Z8673 Personal history of transient ischemic attack (TIA), and cerebral infarction without residual deficits: Secondary | ICD-10-CM | POA: Diagnosis not present

## 2017-04-11 DIAGNOSIS — R197 Diarrhea, unspecified: Secondary | ICD-10-CM

## 2017-04-11 DIAGNOSIS — I959 Hypotension, unspecified: Secondary | ICD-10-CM | POA: Diagnosis not present

## 2017-04-11 DIAGNOSIS — I2510 Atherosclerotic heart disease of native coronary artery without angina pectoris: Secondary | ICD-10-CM

## 2017-04-11 DIAGNOSIS — Z5111 Encounter for antineoplastic chemotherapy: Secondary | ICD-10-CM

## 2017-04-11 LAB — CBC WITH DIFFERENTIAL/PLATELET
BASOS PCT: 0 %
Basophils Absolute: 0 10*3/uL (ref 0.0–0.1)
EOS ABS: 1 10*3/uL — AB (ref 0.0–0.5)
Eosinophils Relative: 10 %
HEMATOCRIT: 39.2 % (ref 38.4–49.9)
HEMOGLOBIN: 12.8 g/dL — AB (ref 13.0–17.1)
Lymphocytes Relative: 16 %
Lymphs Abs: 1.6 10*3/uL (ref 0.9–3.3)
MCH: 28.1 pg (ref 27.2–33.4)
MCHC: 32.7 g/dL (ref 32.0–36.0)
MCV: 86 fL (ref 79.3–98.0)
Monocytes Absolute: 0.4 10*3/uL (ref 0.1–0.9)
Monocytes Relative: 4 %
NEUTROS ABS: 7.5 10*3/uL — AB (ref 1.5–6.5)
NEUTROS PCT: 70 %
Platelets: 210 10*3/uL (ref 140–400)
RBC: 4.56 MIL/uL (ref 4.20–5.82)
RDW: 20.4 % — ABNORMAL HIGH (ref 11.0–15.6)
WBC: 10.5 10*3/uL — AB (ref 4.0–10.3)

## 2017-04-11 LAB — COMPREHENSIVE METABOLIC PANEL
ALBUMIN: 3.8 g/dL (ref 3.5–5.0)
ALK PHOS: 153 U/L — AB (ref 40–150)
ALT: 117 U/L — AB (ref 0–55)
AST: 94 U/L — ABNORMAL HIGH (ref 5–34)
Anion gap: 10 (ref 3–11)
BILIRUBIN TOTAL: 0.6 mg/dL (ref 0.2–1.2)
BUN: 32 mg/dL — AB (ref 7–26)
CALCIUM: 9.8 mg/dL (ref 8.4–10.4)
CO2: 25 mmol/L (ref 22–29)
CREATININE: 1.48 mg/dL — AB (ref 0.70–1.30)
Chloride: 103 mmol/L (ref 98–109)
GFR calc Af Amer: 54 mL/min — ABNORMAL LOW (ref >60)
GFR calc non Af Amer: 46 mL/min — ABNORMAL LOW (ref >60)
GLUCOSE: 98 mg/dL (ref 70–140)
Potassium: 5.1 mmol/L (ref 3.5–5.1)
SODIUM: 138 mmol/L (ref 136–145)
Total Protein: 7.2 g/dL (ref 6.4–8.3)

## 2017-04-11 MED ORDER — SODIUM CHLORIDE 0.9 % IV SOLN
INTRAVENOUS | Status: DC
Start: 2017-04-11 — End: 2017-04-11

## 2017-04-11 MED ORDER — CABOZANTINIB S-MALATE 40 MG PO TABS: 40.0000 mg | ORAL_TABLET | Freq: Every day | ORAL | 1 refills | Status: DC

## 2017-04-11 MED ORDER — ONDANSETRON HCL 4 MG/2ML IJ SOLN
8.0000 mg | Freq: Once | INTRAMUSCULAR | Status: DC
Start: 2017-04-11 — End: 2017-04-11

## 2017-04-11 MED ORDER — SODIUM CHLORIDE 0.9 % IV SOLN: Freq: Once | INTRAVENOUS | Status: DC

## 2017-04-11 MED ORDER — PROCHLORPERAZINE MALEATE 10 MG PO TABS: 10.0000 mg | ORAL_TABLET | Freq: Four times a day (QID) | ORAL | 0 refills | Status: DC | PRN

## 2017-04-11 MED ORDER — SODIUM CHLORIDE 0.9 % IV SOLN
INTRAVENOUS | Status: AC
  Administered 2017-04-11: 10:00:00 via INTRAVENOUS

## 2017-04-11 NOTE — Telephone Encounter (Signed)
Scheduled appt per 1/23 los - Patient is aware of appt date and time.

## 2017-04-11 NOTE — Patient Instructions (Signed)
Dehydration, Adult Dehydration is when there is not enough fluid or water in your body. This happens when you lose more fluids than you take in. Dehydration can range from mild to very bad. It should be treated right away to keep it from getting very bad. Symptoms of mild dehydration may include:  Thirst.  Dry lips.  Slightly dry mouth.  Dry, warm skin.  Dizziness. Symptoms of moderate dehydration may include:  Very dry mouth.  Muscle cramps.  Dark pee (urine). Pee may be the color of tea.  Your body making less pee.  Your eyes making fewer tears.  Heartbeat that is uneven or faster than normal (palpitations).  Headache.  Light-headedness, especially when you stand up from sitting.  Fainting (syncope). Symptoms of very bad dehydration may include:  Changes in skin, such as: ? Cold and clammy skin. ? Blotchy (mottled) or pale skin. ? Skin that does not quickly return to normal after being lightly pinched and let go (poor skin turgor).  Changes in body fluids, such as: ? Feeling very thirsty. ? Your eyes making fewer tears. ? Not sweating when body temperature is high, such as in hot weather. ? Your body making very little pee.  Changes in vital signs, such as: ? Weak pulse. ? Pulse that is more than 100 beats a minute when you are sitting still. ? Fast breathing. ? Low blood pressure.  Other changes, such as: ? Sunken eyes. ? Cold hands and feet. ? Confusion. ? Lack of energy (lethargy). ? Trouble waking up from sleep. ? Short-term weight loss. ? Unconsciousness. Follow these instructions at home:  If told by your doctor, drink an ORS: ? Make an ORS by using instructions on the package. ? Start by drinking small amounts, about  cup (120 mL) every 5-10 minutes. ? Slowly drink more until you have had the amount that your doctor said to have.  Drink enough clear fluid to keep your pee clear or pale yellow. If you were told to drink an ORS, finish the ORS  first, then start slowly drinking clear fluids. Drink fluids such as: ? Water. Do not drink only water by itself. Doing that can make the salt (sodium) level in your body get too low (hyponatremia). ? Ice chips. ? Fruit juice that you have added water to (diluted). ? Low-calorie sports drinks.  Avoid: ? Alcohol. ? Drinks that have a lot of sugar. These include high-calorie sports drinks, fruit juice that does not have water added, and soda. ? Caffeine. ? Foods that are greasy or have a lot of fat or sugar.  Take over-the-counter and prescription medicines only as told by your doctor.  Do not take salt tablets. Doing that can make the salt level in your body get too high (hypernatremia).  Eat foods that have minerals (electrolytes). Examples include bananas, oranges, potatoes, tomatoes, and spinach.  Keep all follow-up visits as told by your doctor. This is important. Contact a doctor if:  You have belly (abdominal) pain that: ? Gets worse. ? Stays in one area (localizes).  You have a rash.  You have a stiff neck.  You get angry or annoyed more easily than normal (irritability).  You are more sleepy than normal.  You have a harder time waking up than normal.  You feel: ? Weak. ? Dizzy. ? Very thirsty.  You have peed (urinated) only a small amount of very dark pee during 6-8 hours. Get help right away if:  You have symptoms of   very bad dehydration.  You cannot drink fluids without throwing up (vomiting).  Your symptoms get worse with treatment.  You have a fever.  You have a very bad headache.  You are throwing up or having watery poop (diarrhea) and it: ? Gets worse. ? Does not go away.  You have blood or something green (bile) in your throw-up.  You have blood in your poop (stool). This may cause poop to look black and tarry.  You have not peed in 6-8 hours.  You pass out (faint).  Your heart rate when you are sitting still is more than 100 beats a  minute.  You have trouble breathing. This information is not intended to replace advice given to you by your health care provider. Make sure you discuss any questions you have with your health care provider. Document Released: 12/31/2008 Document Revised: 09/24/2015 Document Reviewed: 04/30/2015 Elsevier Interactive Patient Education  2018 Elsevier Inc.  

## 2017-04-11 NOTE — Assessment & Plan Note (Addendum)
This is a pleasant 72 year old gentleman withStage IV chromophobe renal cell carcinoma.The patient presented with an11 cm left kidney mass and pulmonary nodules.He  is status post 1 month of treatment with Cabometyx60 mg daily.  This was stopped for approximately 1 month in preparation for his nephrectomy.  The patient had a left nephrectomy which he tolerated well and recovered well from his surgery. The patient resumed treatment with Cabometyx 60 mg daily on 03/22/2017.  He has had weight loss, weakness, diarrhea, nausea, and vomiting over the past week.  These are likely side effects related to the medication.  Labs today reveal that he is dehydrated and that his LFTs are elevated.  Reviewed with Dr. Julien Nordmann.  Recommend that we stop treatment with Cabometyx at this time.  We will plan to bring the patient back next week to repeat his labs and if he has improved, will restart treatment at a lower dose with 40 mg daily.  A prescription was given to the oral chemotherapy pharmacist to assist [place] in getting this new dose for the patient.  The patient was advised not to begin his treatment again prior to seeing [place].  The patient is hypotensive and dehydrated today.  He was given a liter of normal saline here in our office.  He is also having difficulty with nausea and was given 8 mg of IV Zofran here in the office.  A prescription for Compazine 10 mg every 6 hours as needed for nausea and vomiting was sent to his pharmacy.  For diarrhea, recommend that he use Imodium 2 tablets after the first loose stool and then 1 tablet after each additional loose stool up to 8 tablets in 24-hour period.  He was advised to hold his blood pressure medications at this time.  He checks his blood pressure at home and if his systolic blood pressure goes above 130, recommend that he take his blood pressure medications.  I also advised that he talk with his cardiologist regarding dosing of his blood pressure  medications.  All the patient's questions were answered to his satisfaction today he was instructed to call us if he has any questions or concerns in the interim.

## 2017-04-11 NOTE — Progress Notes (Signed)
[facility] OFFICE PROGRESS NOTE  Via, [name], MD [place] [address]  DIAGNOSIS: [name].The patient presented with an11 cm left kidney mass and pulmonary nodules.  PRIOR THERAPY: Status post left nephrectomy on 02/21/2017.  CURRENT THERAPY: Cabometyx 60 mg daily. Started 01/21/2017.  Status post 1 month of treatment.  Treatment was then placed on hold for nephrectomy.  Resuming treatment on 03/22/2017.  Status post 3 weeks of treatment.  The patient was advised to hold Cabometyx starting on 04/11/2017.  INTERVAL HISTORY: Trevor [name] 72 y.o. male returns for routine follow-up visit by himself.  The patient reports that he is in feeling very weak over the past week.  He has been having increased diarrhea about 3 times a day.  He uses Imodium approximately 2 tablets a day with some improvement in his symptoms.  For the past 2 days he has been having nausea and vomited on 2 occasions and has frequent dry heaving.  The patient's appetite has been decreased and he has lost about 9 pounds since his last visit.  His blood pressure has been low and he held his carvedilol and lisinopril for 2 days.  He reports that his systolic blood pressure this morning was 115 and he took his medication.  He denies fevers and chills.  Denies chest pain, shortness breath, cough, hemoptysis.  Denies constipation.  Denies hematuria and dysuria.  The patient is here for evaluation and repeat blood work.   MEDICAL HISTORY: Past Medical History:  Diagnosis Date  . 3-vessel coronary artery disease   . Anemia    2 iron infusions  . Arthritis   . Blood in urine    blood clots noted since biopsy  . Cancer ([place])    Left kidney cancer  . Carotid disease, bilateral (H[facility])    moderate  . Coronary artery disease    06/2012: Anterior ST elevation myocardial infarction. Cardiac catheterization showed significant three-vessel coronary artery disease, ejection  fraction 35-40%. The patient underwent CABG with LIMA to LAD, SVG to ramus and sequential SVG to right PDA/PL  . Diabetes mellitus without complication ([facility])   . Diarrhea   . Dyspnea    mild  . Ejection fraction < 50%   . Frequent urination    comes and goes since biospy  . History of blood transfusion   . Hyperlipidemia   . Ischemic cardiomyopathy    Ejection fraction of 35-40% initially, 45 - 50% on echo 2014, 53% by perfusion study 2016  . Mild mitral regurgitation   . Non-ST elevated myocardial infarction (non-STEMI) ([facility]) 10/2016  . Numbness    Right side since stroke  . OA (osteoarthritis)   . PAD (peripheral artery disease) ([facility])   . Pulmonary nodule   . STEMI (ST elevation myocardial infarction) ([facility]) 06/2012   anterior   . Stroke ([facility]) 2018  . TIA (transient ischemic attack) 10/2016  . Weakness generalized   . Wears glasses     ALLERGIES:  has No Known Allergies.  MEDICATIONS:  Current Outpatient Medications  Medication Sig Dispense Refill  . atorvastatin (LIPITOR) 40 MG tablet Take 1 tablet (40 mg total) by mouth daily. 30 tablet 6  . cabozantinib S-Malate 40 MG TABS Take 40 mg by mouth daily. Take on an empty stomach 1 hour before or 2 hours after a meal. 30 tablet 1  . carvedilol (COREG) 3.125 MG tablet Take 1 tablet (3.125 mg total) by mouth 2 (two) times  daily. 180 tablet 2  . ferrous sulfate 325 (65 FE) MG tablet Take 325 mg by mouth daily with breakfast.    . HYDROcodone-acetaminophen (NORCO) 5-325 MG tablet Take 1-2 tablets by mouth every 6 (six) hours as needed for moderate pain or severe pain. 30 tablet 0  . lisinopril (PRINIVIL,ZESTRIL) 5 MG tablet Take 1 tablet (5 mg total) daily by mouth. 90 tablet 3  . metFORMIN (GLUCOPHAGE) 1000 MG tablet Take 1,000 mg by mouth 2 (two) times daily.   0  . pantoprazole (PROTONIX) 40 MG tablet Take 1 tablet (40 mg total) by mouth at bedtime. 30 tablet 0  . prochlorperazine (COMPAZINE) 10 MG tablet Take 1 tablet (10 mg  total) by mouth every 6 (six) hours as needed for nausea or vomiting. 30 tablet 0  . senna-docusate (SENOKOT-S) 8.6-50 MG tablet Take 1 tablet by mouth 2 (two) times daily. While taking strong pain meds to prevent constipation. 20 tablet 0   Current Facility-Administered Medications  Medication Dose Route Frequency Provider Last Rate Last Dose  . 0.9 %  sodium chloride infusion   Intravenous Continuous Deray Dawes R, NP      . ondansetron (ZOFRAN) injection 8 mg  8 mg Intravenous Once Anamika Kueker, Roselie Awkward, NP        SURGICAL HISTORY:  Past Surgical History:  Procedure Laterality Date  . CARDIOVASCULAR STRESS TEST    . CORONARY ARTERY BYPASS GRAFT N/A 07/05/2012   Procedure: CORONARY ARTERY BYPASS GRAFTING (CABG);  Surgeon: Ivin Poot, MD;  Location: [facility];  Service: Open Heart Surgery;  Laterality: N/A;  . INTRAOPERATIVE TRANSESOPHAGEAL ECHOCARDIOGRAM N/A 07/05/2012   Procedure: INTRAOPERATIVE TRANSESOPHAGEAL ECHOCARDIOGRAM;  Surgeon: Ivin Poot, MD;  Location: [facility];  Service: Open Heart Surgery;  Laterality: N/A;  . LEFT HEART CATH AND CORS/GRAFTS ANGIOGRAPHY N/A 11/14/2016   Procedure: LEFT HEART CATH AND CORS/GRAFTS ANGIOGRAPHY;  Surgeon: Belva Crome, MD;  Location: [facility];  Service: Cardiovascular;  Laterality: N/A;  . LEFT HEART CATHETERIZATION WITH CORONARY ANGIOGRAM N/A 07/04/2012   Procedure: LEFT HEART CATHETERIZATION WITH CORONARY ANGIOGRAM;  Surgeon: Wellington Hampshire, MD;  Location: [facility];  Service: Cardiovascular;  Laterality: N/A;  . RENAL BIOPSY    . ROBOT ASSISTED LAPAROSCOPIC NEPHRECTOMY Left 02/21/2017   Procedure: XI ROBOTIC ASSISTED LAPAROSCOPIC NEPHRECTOMY;  Surgeon: Alexis Frock, MD;  Location: [facility];  Service: Urology;  Laterality: Left;    REVIEW OF SYSTEMS:   Review of Systems  Constitutional: Negative for chills, fever. Positive for fatigue, decreased appetite, and weight loss. HENT:   Negative for mouth sores, nosebleeds, sore  throat and trouble swallowing.   Eyes: Negative for eye problems and icterus.  Respiratory: Negative for cough, hemoptysis, shortness of breath and wheezing.   Cardiovascular: Negative for chest pain and leg swelling.  Gastrointestinal: Negative for abdominal pain, constipation. Positive for diarrhea, nausea, vomiting, dry heaves. Genitourinary: Negative for bladder incontinence, difficulty urinating, dysuria, frequency and hematuria.   Musculoskeletal: Negative for back pain, gait problem, neck pain and neck stiffness.  Skin: Negative for itching and rash.  Neurological: Negative for dizziness, extremity weakness, gait problem, headaches, light-headedness and seizures.  Hematological: Negative for adenopathy. Does not bruise/bleed easily.  Psychiatric/Behavioral: Negative for confusion, depression and sleep disturbance. The patient is not nervous/anxious.     PHYSICAL EXAMINATION:  Blood pressure (!) 78/62, pulse (!) 110, temperature 97.7 F (36.5 C), temperature source Oral, resp. rate 18, height 5\' 11"  (1.803 m), weight 148 lb 11.2 oz (  67.4 kg), SpO2 100 %.  ECOG PERFORMANCE STATUS: 1 - Symptomatic but completely ambulatory  Physical Exam  Constitutional: Oriented to person, place, and time. No distress. Tired appearing. HENT:  Head: Normocephalic and atraumatic.  Mouth/Throat: Oropharynx is clear and moist. No oropharyngeal exudate.  Eyes: Conjunctivae are normal. Right eye exhibits no discharge. Left eye exhibits no discharge. No scleral icterus.  Neck: Normal range of motion. Neck supple.  Cardiovascular: Tachycardia with regular rhythm, normal heart sounds and intact distal pulses.   Pulmonary/Chest: Effort normal and breath sounds normal. No respiratory distress. No wheezes. No rales.  Abdominal: Soft. Bowel sounds are normal. Exhibits no distension and no mass. There is no tenderness.  Musculoskeletal: Normal range of motion. Exhibits no edema.  Lymphadenopathy:    No cervical  adenopathy.  Neurological: Alert and oriented to person, place, and time. Exhibits normal muscle tone. Gait normal. Coordination normal.  Skin: Skin is warm and dry. No rash noted. Not diaphoretic. No erythema. No pallor.  Psychiatric: Mood, memory and judgment normal.  Vitals reviewed.  LABORATORY DATA: Lab Results  Component Value Date   WBC 10.5 (H) 04/11/2017   HGB 12.8 (L) 04/11/2017   HCT 39.2 04/11/2017   MCV 86.0 04/11/2017   PLT 210 04/11/2017      Chemistry      Component Value Date/Time   NA 138 04/11/2017 0730   NA 141 03/22/2017 0919   K 5.1 04/11/2017 0730   K 5.5 (H) 03/22/2017 0919   CL 103 04/11/2017 0730   CO2 25 04/11/2017 0730   CO2 27 03/22/2017 0919   BUN 32 (H) 04/11/2017 0730   BUN 18.1 03/22/2017 0919   CREATININE 1.48 (H) 04/11/2017 0730   CREATININE 1.2 03/22/2017 0919      Component Value Date/Time   CALCIUM 9.8 04/11/2017 0730   CALCIUM 9.8 03/22/2017 0919   ALKPHOS 153 (H) 04/11/2017 0730   ALKPHOS 103 03/22/2017 0919   AST 94 (H) 04/11/2017 0730   AST 15 03/22/2017 0919   ALT 117 (H) 04/11/2017 0730   ALT 13 03/22/2017 0919   BILITOT 0.6 04/11/2017 0730   BILITOT 0.41 03/22/2017 0919       RADIOGRAPHIC STUDIES:  No results found.   ASSESSMENT/PLAN:  Renal cell carcinoma [facility]) This is a pleasant 72 year old gentleman withStage IV chromophobe renal cell carcinoma.The patient presented with an11 cm left kidney mass and pulmonary nodules.He  is status post 1 month of treatment with Cabometyx60 mg daily.  This was stopped for approximately 1 month in preparation for his nephrectomy.  The patient had a left nephrectomy which he tolerated well and recovered well from his surgery. The patient resumed treatment with Cabometyx 60 mg daily on 03/22/2017.  He has had weight loss, weakness, diarrhea, nausea, and vomiting over the past week.  These are likely side effects related to the medication.  Labs today reveal that he is dehydrated and  that his LFTs are elevated.  Reviewed with Dr. Julien Nordmann.  Recommend that we stop treatment with Cabometyx at this time.  We will plan to bring the patient back next week to repeat his labs and if he has improved, will restart treatment at a lower dose with 40 mg daily.  A prescription was given to the oral chemotherapy pharmacist to assist [place] in getting this new dose for the patient.  The patient was advised not to begin his treatment again prior to seeing [place].  The patient is hypotensive and dehydrated today.  He was  given a liter of normal saline here in our office.  He is also having difficulty with nausea and was given 8 mg of IV Zofran here in the office.  A prescription for Compazine 10 mg every 6 hours as needed for nausea and vomiting was sent to his pharmacy.  For diarrhea, recommend that he use Imodium 2 tablets after the first loose stool and then 1 tablet after each additional loose stool up to 8 tablets in 24-hour period.  He was advised to hold his blood pressure medications at this time.  He checks his blood pressure at home and if his systolic blood pressure goes above 130, recommend that he take his blood pressure medications.  I also advised that he talk with his cardiologist regarding dosing of his blood pressure medications.  All the patient's questions were answered to his satisfaction today he was instructed to call us if he has any questions or concerns in the interim.  Orders Placed This Encounter  Procedures  . CBC with Differential (Cancer Center Only)    Standing Status:   Future    Standing Expiration Date:   04/11/2018  . CMP ([facility] only)    Standing Status:   Future    Standing Expiration Date:   04/11/2018     Mikey Bussing, DNP, AGPCNP-BC, AOCNP 04/11/17

## 2017-04-11 NOTE — Patient Instructions (Signed)
I have sent Compazine (prochlorperazine) to your pharmacy. You can take this every 6 hours as needed.   Diarrhea: Use Imodium 2 tabs after first loose stool and then 1 tab after each additional loose stool up to 8 tabs in a 24 hour period.  Hold BP medications. If top number goes above 130, you can restart. Contact Cardiology for further instructions.   Do not take Cabometyx. We are going to look into reducing the dose 40 mg daily. If you get this from the pharmacy, do not start it until after we see you next week.

## 2017-04-17 ENCOUNTER — Inpatient Hospital Stay (HOSPITAL_COMMUNITY)
Admission: EM | Admit: 2017-04-17 | Discharge: 2017-04-20 | DRG: 391 | Disposition: A | Discharge status: Home / Self Care | Payer: Medicare Other | Attending: Internal Medicine | Admitting: Internal Medicine

## 2017-04-17 ENCOUNTER — Other Ambulatory Visit (HOSPITAL_COMMUNITY): Payer: Medicare Other

## 2017-04-17 ENCOUNTER — Encounter (HOSPITAL_COMMUNITY): Payer: Self-pay

## 2017-04-17 ENCOUNTER — Inpatient Hospital Stay (HOSPITAL_COMMUNITY): Payer: Medicare Other

## 2017-04-17 ENCOUNTER — Other Ambulatory Visit: Payer: Self-pay

## 2017-04-17 ENCOUNTER — Ambulatory Visit: Payer: Medicare Other | Admitting: Cardiovascular Disease

## 2017-04-17 ENCOUNTER — Emergency Department (HOSPITAL_COMMUNITY): Payer: Medicare Other

## 2017-04-17 VITALS — BP 66/40 | HR 95 | Ht 71.0 in | Wt 152.2 lb

## 2017-04-17 DIAGNOSIS — Z6821 Body mass index (BMI) 21.0-21.9, adult: Secondary | ICD-10-CM | POA: Diagnosis not present

## 2017-04-17 DIAGNOSIS — E861 Hypovolemia: Secondary | ICD-10-CM

## 2017-04-17 DIAGNOSIS — Z905 Acquired absence of kidney: Secondary | ICD-10-CM

## 2017-04-17 DIAGNOSIS — I2510 Atherosclerotic heart disease of native coronary artery without angina pectoris: Secondary | ICD-10-CM

## 2017-04-17 DIAGNOSIS — I5022 Chronic systolic (congestive) heart failure: Secondary | ICD-10-CM

## 2017-04-17 DIAGNOSIS — I9589 Other hypotension: Secondary | ICD-10-CM | POA: Diagnosis not present

## 2017-04-17 DIAGNOSIS — E860 Dehydration: Secondary | ICD-10-CM | POA: Diagnosis present

## 2017-04-17 DIAGNOSIS — I959 Hypotension, unspecified: Secondary | ICD-10-CM | POA: Diagnosis present

## 2017-04-17 DIAGNOSIS — Z8673 Personal history of transient ischemic attack (TIA), and cerebral infarction without residual deficits: Secondary | ICD-10-CM

## 2017-04-17 DIAGNOSIS — N170 Acute kidney failure with tubular necrosis: Secondary | ICD-10-CM | POA: Diagnosis present

## 2017-04-17 DIAGNOSIS — N179 Acute kidney failure, unspecified: Secondary | ICD-10-CM | POA: Diagnosis not present

## 2017-04-17 DIAGNOSIS — M1990 Unspecified osteoarthritis, unspecified site: Secondary | ICD-10-CM | POA: Diagnosis present

## 2017-04-17 DIAGNOSIS — E875 Hyperkalemia: Secondary | ICD-10-CM | POA: Diagnosis present

## 2017-04-17 DIAGNOSIS — E785 Hyperlipidemia, unspecified: Secondary | ICD-10-CM | POA: Diagnosis present

## 2017-04-17 DIAGNOSIS — I739 Peripheral vascular disease, unspecified: Secondary | ICD-10-CM | POA: Diagnosis not present

## 2017-04-17 DIAGNOSIS — Z79899 Other long term (current) drug therapy: Secondary | ICD-10-CM

## 2017-04-17 DIAGNOSIS — I252 Old myocardial infarction: Secondary | ICD-10-CM | POA: Diagnosis not present

## 2017-04-17 DIAGNOSIS — E1151 Type 2 diabetes mellitus with diabetic peripheral angiopathy without gangrene: Secondary | ICD-10-CM | POA: Diagnosis present

## 2017-04-17 DIAGNOSIS — I255 Ischemic cardiomyopathy: Secondary | ICD-10-CM | POA: Diagnosis present

## 2017-04-17 DIAGNOSIS — T451X5A Adverse effect of antineoplastic and immunosuppressive drugs, initial encounter: Secondary | ICD-10-CM | POA: Diagnosis present

## 2017-04-17 DIAGNOSIS — C642 Malignant neoplasm of left kidney, except renal pelvis: Secondary | ICD-10-CM | POA: Diagnosis present

## 2017-04-17 DIAGNOSIS — Z7982 Long term (current) use of aspirin: Secondary | ICD-10-CM

## 2017-04-17 DIAGNOSIS — E440 Moderate protein-calorie malnutrition: Secondary | ICD-10-CM

## 2017-04-17 DIAGNOSIS — K529 Noninfective gastroenteritis and colitis, unspecified: Principal | ICD-10-CM | POA: Diagnosis present

## 2017-04-17 DIAGNOSIS — Z87891 Personal history of nicotine dependence: Secondary | ICD-10-CM | POA: Diagnosis not present

## 2017-04-17 DIAGNOSIS — E872 Acidosis: Secondary | ICD-10-CM | POA: Diagnosis present

## 2017-04-17 DIAGNOSIS — Z951 Presence of aortocoronary bypass graft: Secondary | ICD-10-CM | POA: Diagnosis not present

## 2017-04-17 DIAGNOSIS — Z8249 Family history of ischemic heart disease and other diseases of the circulatory system: Secondary | ICD-10-CM | POA: Diagnosis not present

## 2017-04-17 DIAGNOSIS — I5042 Chronic combined systolic (congestive) and diastolic (congestive) heart failure: Secondary | ICD-10-CM | POA: Diagnosis present

## 2017-04-17 DIAGNOSIS — E119 Type 2 diabetes mellitus without complications: Secondary | ICD-10-CM

## 2017-04-17 DIAGNOSIS — R197 Diarrhea, unspecified: Secondary | ICD-10-CM | POA: Diagnosis present

## 2017-04-17 DIAGNOSIS — Z823 Family history of stroke: Secondary | ICD-10-CM

## 2017-04-17 DIAGNOSIS — Z7984 Long term (current) use of oral hypoglycemic drugs: Secondary | ICD-10-CM

## 2017-04-17 HISTORY — DX: Chronic systolic (congestive) heart failure: I50.22

## 2017-04-17 HISTORY — DX: Malignant neoplasm of left kidney, except renal pelvis: C64.2

## 2017-04-17 LAB — CBC WITH DIFFERENTIAL/PLATELET
Basophils Absolute: 0 10*3/uL (ref 0.0–0.1)
Basophils Relative: 0 %
EOS ABS: 0.6 10*3/uL (ref 0.0–0.7)
Eosinophils Relative: 8 %
HEMATOCRIT: 34.6 % — AB (ref 39.0–52.0)
HEMOGLOBIN: 11.4 g/dL — AB (ref 13.0–17.0)
LYMPHS PCT: 18 %
Lymphs Abs: 1.4 10*3/uL (ref 0.7–4.0)
MCH: 28.4 pg (ref 26.0–34.0)
MCHC: 32.9 g/dL (ref 30.0–36.0)
MCV: 86.3 fL (ref 78.0–100.0)
MONOS PCT: 4 %
Monocytes Absolute: 0.3 10*3/uL (ref 0.1–1.0)
NEUTROS ABS: 5.7 10*3/uL (ref 1.7–7.7)
NEUTROS PCT: 70 %
Platelets: 161 10*3/uL (ref 150–400)
RBC: 4.01 MIL/uL — AB (ref 4.22–5.81)
RDW: 21.5 % — ABNORMAL HIGH (ref 11.5–15.5)
WBC: 8 10*3/uL (ref 4.0–10.5)

## 2017-04-17 LAB — COMPREHENSIVE METABOLIC PANEL
ALBUMIN: 3.3 g/dL — AB (ref 3.5–5.0)
ALT: 43 U/L (ref 17–63)
ANION GAP: 13 (ref 5–15)
AST: 40 U/L (ref 15–41)
Alkaline Phosphatase: 112 U/L (ref 38–126)
BILIRUBIN TOTAL: 1.1 mg/dL (ref 0.3–1.2)
BUN: 42 mg/dL — ABNORMAL HIGH (ref 6–20)
CO2: 19 mmol/L — ABNORMAL LOW (ref 22–32)
Calcium: 9 mg/dL (ref 8.9–10.3)
Chloride: 105 mmol/L (ref 101–111)
Creatinine, Ser: 3.25 mg/dL — ABNORMAL HIGH (ref 0.61–1.24)
GFR calc non Af Amer: 18 mL/min — ABNORMAL LOW (ref >60)
GFR, EST AFRICAN AMERICAN: 21 mL/min — AB (ref >60)
GLUCOSE: 133 mg/dL — AB (ref 65–99)
POTASSIUM: 5.4 mmol/L — AB (ref 3.5–5.1)
SODIUM: 137 mmol/L (ref 135–145)
TOTAL PROTEIN: 6.1 g/dL — AB (ref 6.5–8.1)

## 2017-04-17 LAB — I-STAT TROPONIN, ED: TROPONIN I, POC: 0.01 ng/mL (ref 0.00–0.08)

## 2017-04-17 LAB — URINALYSIS, ROUTINE W REFLEX MICROSCOPIC
BILIRUBIN URINE: NEGATIVE
Glucose, UA: NEGATIVE mg/dL
Hgb urine dipstick: NEGATIVE
KETONES UR: NEGATIVE mg/dL
LEUKOCYTES UA: NEGATIVE
NITRITE: NEGATIVE
PH: 5 (ref 5.0–8.0)
PROTEIN: NEGATIVE mg/dL
Specific Gravity, Urine: 1.012 (ref 1.005–1.030)

## 2017-04-17 LAB — I-STAT CHEM 8, ED
BUN: 40 mg/dL — AB (ref 6–20)
CALCIUM ION: 1.2 mmol/L (ref 1.15–1.40)
Chloride: 105 mmol/L (ref 101–111)
Creatinine, Ser: 3.1 mg/dL — ABNORMAL HIGH (ref 0.61–1.24)
Glucose, Bld: 127 mg/dL — ABNORMAL HIGH (ref 65–99)
HEMATOCRIT: 33 % — AB (ref 39.0–52.0)
HEMOGLOBIN: 11.2 g/dL — AB (ref 13.0–17.0)
Potassium: 5.4 mmol/L — ABNORMAL HIGH (ref 3.5–5.1)
Sodium: 138 mmol/L (ref 135–145)
TCO2: 21 mmol/L — AB (ref 22–32)

## 2017-04-17 LAB — CK: Total CK: 81 U/L (ref 49–397)

## 2017-04-17 LAB — LACTIC ACID, PLASMA
LACTIC ACID, VENOUS: 1.4 mmol/L (ref 0.5–1.9)
Lactic Acid, Venous: 3 mmol/L (ref 0.5–1.9)

## 2017-04-17 LAB — I-STAT CG4 LACTIC ACID, ED
LACTIC ACID, VENOUS: 4.11 mmol/L — AB (ref 0.5–1.9)
Lactic Acid, Venous: 2.63 mmol/L (ref 0.5–1.9)

## 2017-04-17 LAB — MRSA PCR SCREENING: MRSA BY PCR: NEGATIVE

## 2017-04-17 LAB — GLUCOSE, CAPILLARY
GLUCOSE-CAPILLARY: 82 mg/dL (ref 65–99)
GLUCOSE-CAPILLARY: 106 mg/dL — AB (ref 65–99)

## 2017-04-17 MED ORDER — INSULIN ASPART 100 UNIT/ML ~~LOC~~ SOLN: 0.0000 [IU] | Freq: Every day | SUBCUTANEOUS | Status: DC

## 2017-04-17 MED ORDER — SODIUM CHLORIDE 0.9 % IV SOLN
INTRAVENOUS | Status: DC
  Administered 2017-04-17 – 2017-04-18 (×2): via INTRAVENOUS

## 2017-04-17 MED ORDER — SODIUM CHLORIDE 0.9 % IV BOLUS (SEPSIS)
500.0000 mL | Freq: Once | INTRAVENOUS | Status: AC
  Administered 2017-04-17: 500 mL via INTRAVENOUS

## 2017-04-17 MED ORDER — METRONIDAZOLE IN NACL 5-0.79 MG/ML-% IV SOLN
500.0000 mg | Freq: Three times a day (TID) | INTRAVENOUS | Status: DC
  Administered 2017-04-17: 500 mg via INTRAVENOUS
  Filled 2017-04-17 (×2): qty 100

## 2017-04-17 MED ORDER — SODIUM CHLORIDE 0.9 % IV BOLUS (SEPSIS)
1000.0000 mL | Freq: Once | INTRAVENOUS | Status: AC
  Administered 2017-04-17: 1000 mL via INTRAVENOUS

## 2017-04-17 MED ORDER — ENOXAPARIN SODIUM 40 MG/0.4ML ~~LOC~~ SOLN: 40.0000 mg | SUBCUTANEOUS | Status: DC

## 2017-04-17 MED ORDER — BOOST / RESOURCE BREEZE PO LIQD CUSTOM
1.0000 | Freq: Three times a day (TID) | ORAL | Status: DC
  Administered 2017-04-17: 1 via ORAL

## 2017-04-17 MED ORDER — SODIUM CHLORIDE 0.9% FLUSH
3.0000 mL | Freq: Two times a day (BID) | INTRAVENOUS | Status: DC
  Administered 2017-04-17 – 2017-04-20 (×6): 3 mL via INTRAVENOUS

## 2017-04-17 MED ORDER — ENOXAPARIN SODIUM 30 MG/0.3ML ~~LOC~~ SOLN
30.0000 mg | SUBCUTANEOUS | Status: DC
  Administered 2017-04-17 – 2017-04-19 (×3): 30 mg via SUBCUTANEOUS
  Filled 2017-04-17 (×3): qty 0.3

## 2017-04-17 MED ORDER — ACETAMINOPHEN 325 MG PO TABS: 650.0000 mg | ORAL_TABLET | Freq: Four times a day (QID) | ORAL | Status: DC | PRN

## 2017-04-17 MED ORDER — SODIUM CHLORIDE 0.9 % IV BOLUS (SEPSIS)
1500.0000 mL | Freq: Once | INTRAVENOUS | Status: AC
  Administered 2017-04-17: 1500 mL via INTRAVENOUS

## 2017-04-17 MED ORDER — METRONIDAZOLE IN NACL 5-0.79 MG/ML-% IV SOLN: 500.0000 mg | Freq: Three times a day (TID) | INTRAVENOUS | Status: DC

## 2017-04-17 MED ORDER — VANCOMYCIN 50 MG/ML ORAL SOLUTION
500.0000 mg | Freq: Four times a day (QID) | ORAL | Status: DC
  Administered 2017-04-17 – 2017-04-18 (×4): 500 mg via ORAL
  Filled 2017-04-17 (×8): qty 10

## 2017-04-17 MED ORDER — INSULIN ASPART 100 UNIT/ML ~~LOC~~ SOLN
0.0000 [IU] | Freq: Three times a day (TID) | SUBCUTANEOUS | Status: DC
  Administered 2017-04-19: 1 [IU] via SUBCUTANEOUS

## 2017-04-17 MED ORDER — ACETAMINOPHEN 650 MG RE SUPP: 650.0000 mg | Freq: Four times a day (QID) | RECTAL | Status: DC | PRN

## 2017-04-17 MED ORDER — PROMETHAZINE HCL 25 MG/ML IJ SOLN: 12.5000 mg | Freq: Four times a day (QID) | INTRAMUSCULAR | Status: DC | PRN

## 2017-04-17 MED ORDER — ONDANSETRON HCL 4 MG/2ML IJ SOLN: 4.0000 mg | Freq: Four times a day (QID) | INTRAMUSCULAR | Status: DC | PRN

## 2017-04-17 MED ORDER — SODIUM CHLORIDE 0.9 % IV SOLN
INTRAVENOUS | Status: AC
  Administered 2017-04-17: 125 mL/h via INTRAVENOUS

## 2017-04-17 MED ORDER — METRONIDAZOLE IN NACL 5-0.79 MG/ML-% IV SOLN: 500.0000 mg | Freq: Once | INTRAVENOUS | Status: DC

## 2017-04-17 NOTE — Progress Notes (Signed)
Patient admitted to 6E16 from ED via stretcher  Bed in low position, wheels locked.  Patient denies chest pain/shortness of breath.  Telemetry monitor applied.  Patient oriented to environment, including call bell, TV, meal times, and hourly rounding. Hat placed in bathroom to collect ordered stool specimens.  Patient aware that he needs to call Nursing Staff when a specimen is available for collection.

## 2017-04-17 NOTE — ED Notes (Signed)
Patient transported to X-ray 

## 2017-04-17 NOTE — ED Notes (Signed)
Call ultrasound when the pt is ready

## 2017-04-17 NOTE — H&P (Signed)
History and Physical    Trevor [name] PJA:[id number] DOB: 1945-12-17 DOA: 04/17/2017  **Will admit patient based on the expectation that the patient will need hospitalization/ hospital care that crosses at least 2 midnights  PCP: Via, [name], MD   Attending physician: Lorin Mercy  Patient coming from/Resides with: Private residence/lives with sister.  Prior to cancer diagnosis moved in with sister to assist with her husband that has Alzheimer's  Chief Complaint: Hypotension, profound watery diarrhea times 1 week  HPI: Trevor [name] is a 72 y.o. male with medical history significant for remote history of tobacco abuse (quit 2007), history of CVA, peripheral vascular disease, history of CAD with MI, ischemic cardiomyopathy with chronic combined systolic and diastolic heart failure, diabetes on oral medications.  Patient was diagnosed with left renal cell carcinoma in October 2018 and subsequently underwent left robotic radical nephrectomy on 02/21/17.  He has been followed by Dr. Julien Nordmann with oncology and has been treated with Carbometyx oral chemotherapy.  He was started on chemotherapy 3 weeks ago and for the first 2 weeks had no significant symptoms.  Last week he developed significant high volume watery brown diarrhea multiple stools daily associated with anorexia dry heaves and generalized weakness.  He followed up at the oncology office on 1/23 and was given a liter of fluids and a prescription for Compazine.  He was also instructed to begin using Imodium as needed and was instructed to stop his Carbometyx as well as his blood pressure medications.  He did not stop his metformin.  Patient has noticed since initiation of Imodium decreased.  Unfortunately he continued to feel weak.  He had a dizzy episode on 1/28 and had a syncopal episode.  He presented to the cardiology office today where he was found to be significantly hypotensive a BP of 66/50.  He was sent to the ER for further evaluation and  treatment.  ER he was once again hypotensive with SBP in the 60s.  Labs revealed acute kidney injury with a BUN of 42 and a creatinine of 3.25 and mild hyperkalemia with potassium of 5.4.  He also had metabolic acidemia with a CO2 of 19.  Lactic acid was also elevated at 4.11.  He did not have any leukocytosis or leukopenia and platelets were normal.  He has been given 2.5 L of fluid.  Blood pressure has improved to 109/68 although patient has still not had any urine output.  Lactic acid has decreased to 2.63.  Although patient's oral chemo therapy has documented side effect of severe diarrhea EDP opted to give patient Flagyl while waiting on stool for C. difficile and GI pathogen panel with PCR to return.  Request has been made for patient to be admitted by internal medicine service.  ED Course:  Vital Signs: BP (!) 127/99 (BP Location: Right Arm)   Pulse 78   Temp 98.1 F (36.7 C)   Resp 17   Ht 5\' 11"  (1.803 m)   Wt 68.9 kg (152 lb)   SpO2 100%   BMI 21.20 kg/m  Lab data: Sodium 137, potassium 5.4, chloride 105, CO2 19, glucose 133, BUN 42, creatinine 3.25, anion gap 13, alkaline phosphatase 112, albumin 3.3, LFTs are now normal, poc troponin 0.01, WBCs 8000 with normal differential, hemoglobin 11.4, platelets 161,000, initial serum lactate 4.11 with decreased to 2.63 after volume resuscitation. Blood cxs obtained in ER Medications and treatments: Normal saline bolus times 2.5 L, Flagyl 500 mg IV x1  Review of Systems:  In addition  to the HPI above,  No Fever-chills, myalgias or other constitutional symptoms No Headache, changes with Vision or hearing, new weakness, tingling, numbness in any extremity, dysarthria or word finding difficulty, gait disturbance or imbalance, tremors or seizure activity No problems swallowing food or Liquids, indigestion/reflux, choking or coughing while eating, abdominal pain with or after eating No Chest pain, Cough or Shortness of Breath, palpitations,  orthopnea or DOE No Abdominal pain, melena,hematochezia, dark tarry stools, constipation No dysuria, malodorous urine, hematuria or flank pain No new skin rashes, lesions, masses or bruises, No new joint pains, aches, swelling or redness No recent unintentional weight gain; noted with weight decreasing from 167 pounds in September current weight of 152 pounds-5 pound weight loss beginning 1/3 (157 pounds)  No polyuria, polydypsia or polyphagia   Past Medical History:  Diagnosis Date  . 3-vessel coronary artery disease   . Anemia    2 iron infusions  . Arthritis   . Blood in urine    blood clots noted since biopsy  . Cancer ([place])    Left kidney cancer  . Carotid disease, bilateral (HCC)    moderate  . Chronic systolic heart failure (HCC)    EF 45%  . Coronary artery disease    06/2012: Anterior ST elevation myocardial infarction. Cardiac catheterization showed significant three-vessel coronary artery disease, ejection fraction 35-40%. The patient underwent CABG with LIMA to LAD, SVG to ramus and sequential SVG to right PDA/PL  . Diabetes mellitus without complication ([place])   . Diarrhea   . Dyspnea    mild  . Ejection fraction < 50%   . Frequent urination    comes and goes since biospy  . History of blood transfusion   . Hyperlipidemia   . Ischemic cardiomyopathy    Ejection fraction of 35-40% initially, 45 - 50% on echo 2014, 53% by perfusion study 2016  . Mild mitral regurgitation   . Non-ST elevated myocardial infarction (non-STEMI) ([place]) 10/2016  . Numbness    Right side since stroke  . OA (osteoarthritis)   . PAD (peripheral artery disease) ([place])   . Pulmonary nodule   . Renal cell cancer, left ([place]) 12/2016   Associated w/ pulmonary nodules  . STEMI (ST elevation myocardial infarction) ([place]) 06/2012   anterior   . Stroke ([place]) 2018  . TIA (transient ischemic attack) 10/2016  . Weakness generalized   . Wears glasses     Past Surgical History:  Procedure  Laterality Date  . CARDIOVASCULAR STRESS TEST    . CORONARY ARTERY BYPASS GRAFT N/A 07/05/2012   Procedure: CORONARY ARTERY BYPASS GRAFTING (CABG);  Surgeon: Ivin Poot, MD;  Location: [facility];  Service: Open Heart Surgery;  Laterality: N/A;  . INTRAOPERATIVE TRANSESOPHAGEAL ECHOCARDIOGRAM N/A 07/05/2012   Procedure: INTRAOPERATIVE TRANSESOPHAGEAL ECHOCARDIOGRAM;  Surgeon: Ivin Poot, MD;  Location: [facility];  Service: Open Heart Surgery;  Laterality: N/A;  . LEFT HEART CATH AND CORS/GRAFTS ANGIOGRAPHY N/A 11/14/2016   Procedure: LEFT HEART CATH AND CORS/GRAFTS ANGIOGRAPHY;  Surgeon: Belva Crome, MD;  Location: [facility];  Service: Cardiovascular;  Laterality: N/A;  . LEFT HEART CATHETERIZATION WITH CORONARY ANGIOGRAM N/A 07/04/2012   Procedure: LEFT HEART CATHETERIZATION WITH CORONARY ANGIOGRAM;  Surgeon: Wellington Hampshire, MD;  Location: [facility];  Service: Cardiovascular;  Laterality: N/A;  . RENAL BIOPSY    . ROBOT ASSISTED LAPAROSCOPIC NEPHRECTOMY Left 02/21/2017   Procedure: XI ROBOTIC ASSISTED LAPAROSCOPIC NEPHRECTOMY;  Surgeon: Alexis Frock, MD;  Location: [facility];  Service: Urology;  Laterality: Left;    Social History   Socioeconomic History  . Marital status: Widowed    Spouse name: Not on file  . Number of children: Not on file  . Years of education: Not on file  . Highest education level: Not on file  Social Needs  . Financial resource strain: Not on file  . Food insecurity - worry: Not on file  . Food insecurity - inability: Not on file  . Transportation needs - medical: Not on file  . Transportation needs - non-medical: Not on file  Occupational History  . Occupation: Retired Arts development officer  . Smoking status: Former Smoker    Packs/day: 1.00    Years: 50.00    Pack years: 50.00    Last attempt to quit: 04/20/2012    Years since quitting: 4.9  . Smokeless tobacco: Never Used  Substance and Sexual Activity  . Alcohol use: No  . Drug use:  No  . Sexual activity: Not on file    Comment: retired, lives with sister and husband  Other Topics Concern  . Not on file  Social History Narrative   Lives with sister.    Mobility: Independent Work history: Not obtained   No Known Allergies  Family History  Problem Relation Age of Onset  . Cancer Mother        leukemia  . Stroke Mother   . Cancer Maternal Grandfather        possible cancer  . Heart attack Brother 50     Prior to Admission medications   Medication Sig Start Date End Date Taking? Authorizing Provider  aspirin EC 81 MG tablet Take 81 mg by mouth daily.   Yes [provider]  atorvastatin (LIPITOR) 40 MG tablet Take 1 tablet (40 mg total) by mouth daily. 05/20/13  Yes Wellington Hampshire, MD  carvedilol (COREG) 3.125 MG tablet Take 1 tablet (3.125 mg total) by mouth 2 (two) times daily. 08/24/15  Yes Wellington Hampshire, MD  ferrous sulfate 325 (65 FE) MG tablet Take 325 mg by mouth daily with breakfast.   Yes [provider]  lisinopril (PRINIVIL,ZESTRIL) 5 MG tablet Take 1 tablet (5 mg total) daily by mouth. 01/30/17  Yes Wellington Hampshire, MD  loperamide (IMODIUM) 2 MG capsule Take 4 mg by mouth as needed for diarrhea or loose stools.   Yes [provider]  metFORMIN (GLUCOPHAGE) 1000 MG tablet Take 1,000 mg by mouth 2 (two) times daily.  11/19/15  Yes [provider]  pantoprazole (PROTONIX) 40 MG tablet Take 1 tablet (40 mg total) by mouth at bedtime. 11/22/16  Yes Patteson, Arlan Organ, NP  prochlorperazine (COMPAZINE) 10 MG tablet Take 1 tablet (10 mg total) by mouth every 6 (six) hours as needed for nausea or vomiting. 04/11/17  Yes Curcio, Roselie Awkward, NP  senna-docusate (SENOKOT-S) 8.6-50 MG tablet Take 1 tablet by mouth 2 (two) times daily. While taking strong pain meds to prevent constipation. 02/23/17  Yes Alexis Frock, MD  cabozantinib S-Malate 40 MG TABS Take 40 mg by mouth daily. Take on an empty stomach 1 hour before or 2 hours  after a meal. 04/11/17   Curt Bears, MD  HYDROcodone-acetaminophen [facility]) 5-325 MG tablet Take 1-2 tablets by mouth every 6 (six) hours as needed for moderate pain or severe pain. Patient not taking: Reported on 04/17/2017 02/21/17   Debbrah Alar, PA-C    Physical Exam: Vitals:   04/17/17 1115 04/17/17 1130  04/17/17 1145 04/17/17 1201  BP: 116/72 113/68 109/68 (!) 127/99  Pulse:    78  Resp: 13 11 15 17   Temp:      SpO2:    100%  Weight:      Height:          Constitutional: NAD, calm, comfortable-appears pale Eyes: PERRL, lids and conjunctivae normal ENMT: Mucous membranes are dry. Posterior pharynx clear of any exudate or lesions. Poor dentition.  Neck: normal, supple, no masses, no thyromegaly Respiratory: clear to auscultation bilaterally, no wheezing, no crackles. Normal respiratory effort. No accessory muscle use.  Cardiovascular: Regular rate and rhythm, no murmurs / rubs / gallops. No extremity edema. 2+ pedal pulses. No carotid bruits.  Abdomen: no tenderness, no masses palpated. No hepatosplenomegaly. Bowel sounds positive.  No diarrhea since arrival with patient stating took dose of Imodium prior to coming to ER. Genitourinary: No urinary output since arrival; patient denies sensation of full bladder and there is no lower abdominal distention noted Musculoskeletal: no clubbing / cyanosis. No joint deformity upper and lower extremities. Good ROM, no contractures. Normal muscle tone.  Skin: no rashes, lesions, ulcers. No induration Neurologic: CN 2-12 grossly intact. Sensation intact, DTR normal. Strength 5/5 x all 4 extremities.  Psychiatric: Normal judgment and insight. Alert and oriented x 3. Normal mood.    Labs on Admission: I have personally reviewed following labs and imaging studies  CBC: Recent Labs  Lab 04/11/17 0730 04/17/17 0921 04/17/17 0948  WBC 10.5* 8.0  --   NEUTROABS 7.5* 5.7  --   HGB 12.8* 11.4* 11.2*  HCT 39.2 34.6* 33.0*  MCV 86.0 86.3   --   PLT 210 161  --    Basic Metabolic Panel: Recent Labs  Lab 04/11/17 0730 04/17/17 0921 04/17/17 0948  NA 138 137 138  K 5.1 5.4* 5.4*  CL 103 105 105  CO2 25 19*  --   GLUCOSE 98 133* 127*  BUN 32* 42* 40*  CREATININE 1.48* 3.25* 3.10*  CALCIUM 9.8 9.0  --    GFR: Estimated Creatinine Clearance: 21.6 mL/min (A) (by C-G formula based on SCr of 3.1 mg/dL (H)). Liver Function Tests: Recent Labs  Lab 04/11/17 0730 04/17/17 0921  AST 94* 40  ALT 117* 43  ALKPHOS 153* 112  BILITOT 0.6 1.1  PROT 7.2 6.1*  ALBUMIN 3.8 3.3*   No results for input(s): LIPASE, AMYLASE in the last 168 hours. No results for input(s): AMMONIA in the last 168 hours. Coagulation Profile: No results for input(s): INR, PROTIME in the last 168 hours. Cardiac Enzymes: No results for input(s): CKTOTAL, CKMB, CKMBINDEX, TROPONINI in the last 168 hours. BNP (last 3 results) No results for input(s): PROBNP in the last 8760 hours. HbA1C: No results for input(s): HGBA1C in the last 72 hours. CBG: No results for input(s): GLUCAP in the last 168 hours. Lipid Profile: No results for input(s): CHOL, HDL, LDLCALC, TRIG, CHOLHDL, LDLDIRECT in the last 72 hours. Thyroid Function Tests: No results for input(s): TSH, T4TOTAL, FREET4, T3FREE, THYROIDAB in the last 72 hours. Anemia Panel: No results for input(s): VITAMINB12, FOLATE, FERRITIN, TIBC, IRON, RETICCTPCT in the last 72 hours. Urine analysis:    Component Value Date/Time   COLORURINE YELLOW 11/20/2016 [place] 11/20/2016 0657   LABSPEC 1.034 (H) 11/20/2016 0657   PHURINE 5.0 11/20/2016 [place] 11/20/2016 0657   HGBUR NEGATIVE 11/20/2016 [place] NEGATIVE 11/20/2016 [place] 11/20/2016  [place] 11/20/2016 0657   UROBILINOGEN 0.2 07/04/2012 1852   NITRITE NEGATIVE 11/20/2016 0657   LEUKOCYTESUR NEGATIVE 11/20/2016 0657   Sepsis  Labs: @LABRCNTIP (procalcitonin:4,lacticidven:4) )No results found for this or any previous visit (from the past 240 hour(s)).   Radiological Exams on Admission: Dg Chest 2 View  Result Date: 04/17/2017 CLINICAL DATA:  Hypotension and weakness for several days EXAM: CHEST  2 VIEW COMPARISON:  12/25/2008 FINDINGS: Cardiac shadow is within normal limits. The lungs are well aerated bilaterally. Small nodular density is noted in the right upper lobe similar to that seen on prior PET-CT. Postsurgical changes are noted. No acute bony abnormality is seen. IMPRESSION: Stable right upper lobe nodule. No acute abnormality noted. Electronically Signed   By: Inez Catalina M.D.   On: 04/17/2017 10:07    EKG: (Independently reviewed) sinus rhythm ventricular rate 91 bpm, QTC 397 ms, normal R wave rotation, no acute ischemic changes, nonspecific ST changes in leads V3 through V6 unchanged from previous EKG  Assessment/Plan Principal Problem:   Acute kidney injury 2/2 dehydration/Severe diarrhea/mild hyperkalemia -Patient reports 1 week of profound watery diarrhea treated initially with antiemetics and IV fluids at oncology office on 1/23.  Patient was also hypotensive at that time so carvedilol and lisinopril were held. -Recurrent dizziness and weakness with documented outpatient systolic blood pressure 66 at cardiology office today with blood pressure again in the 60s upon arrival to the ER -EDP has ordered a renal ultrasound -Patient with baseline normal renal function: 21/1.24 -Current renal function: BUN 42 creatinine 3.25 with associated non-anion gap metabolic acidemia and elevated serum lactate -Lactate has also improved with administration of fluids-continue to cycle until less than 2.0 -Patient was on metformin and ACE inhibitor prior to arrival so likely has a degree of ATN-we will hold nephrotoxic medications for now -Suspect recent hypotension contributing to acute renal failure/ATN as well  (hypoperfusion) -Mild hyperkalemia in context of ACE inhibitor and dehydration-anticipate will improve with rehydration -Noted with no urinary output since arrival/fluid boluses so we will give an additional 1 L bolus now and continue maintenance fluids at 125/hr for 12 hours then decrease to 75/hr -Check CK  -Follow electrolytes -Diarrhea likely related to oral chemotherapy which has been stopped but could be related to viral gastroenteritis and/or C. difficile so until clarified will give IV Flagyl and oral vancomycin, placed on enteric precautions-de-escalate current management if no signs of infectious diarrhea -Aloe up on blood cultures -No fever, leukocytosis, abdominal pain making C difficile related diarrhea less likely -Cautious use of Imodium until infectious etiology clarified -IV Zofran/Phenergan for nausea  **335 pm: RN reports just over 100 cc urine out after ~3700 cc fluids. SBP 130s- will give add'l 500 cc NS bolus then cont maintenance IVFs at 125/hr  Active Problems:   Renal cell cancer, left /pulmonary nodules -Status post left nephrectomy -Followed by Dr. Waylan Boga as consultant -Given above symptoms plan was to eventually resume oral chemo and decrease dose from 60 mg to 40 mg    Chronic combined systolic and diastolic heart failure/ischemic cardiomyopathy -Patient currently dehydrated and therefore clinically compensated -Last echocardiogram September 2018: Mild concentric hypertrophy, EF 45-50%, focal hypokinesis anterior septal myocardium, grade 2 diastolic dysfunction, mild tricuspid regurgitation, mild mitral valve regurgitation -Holding preadmission carvedilol and ACE inhibitor 2/2 recurrent hypotension and acute kidney injury -Daily weights, strict I's/O    Diabetes mellitus type 2 in nonobese  -Hold metformin secondary to AKI -Follow CBGs -SSI    History  of CVA (cerebrovascular accident) -Was on baby aspirin and statin prior to admission -Stroke,  August 2018 -Carotid duplex showed moderate bilateral disease greater on the right    3-vessel coronary artery disease -History of prior CABG -Most recent cardiac catheterization was August 2018-treatment optimized -Has associated severe PAD with moderate reduced ABI on the right due to severe SFA disease-currently on medical management      DVT prophylaxis: Lovenox Code Status: Full Family Communication: No family at bedside Disposition Plan: Home Consults called: Oncology/Mohamed added to consultant list as courtesy since no acute oncology needs identified    Addisynn Vassell L. ANP-BC [facility] Pager [phone]   If 7PM-7AM, please contact night-coverage [email or web address] Password [facility]  04/17/2017, 12:06 PM

## 2017-04-17 NOTE — ED Triage Notes (Signed)
Pt arrives EMS from MD office where he was seen for weakness and diarrhea zx 1 week. Pt states he passed out at home on Sunday and has had  multiple episodes of diarrhea per day. Pt states BP at home in 50's over last several days. 29 at MD office. Stopped BP meds on last Wednesday

## 2017-04-17 NOTE — ED Notes (Signed)
Pt returns from US.

## 2017-04-17 NOTE — Progress Notes (Signed)
Cardiology Office Note   Date:  04/17/2017   ID:  Trevor [name], DOB 19-Oct-1945, MRN [id number]  PCP:  [name], MD  Cardiologist:   [name], MD   Chief Complaint  Patient presents with  . Dizziness    pt c/o dizziness and low BP, pt is off of chemo because of his low BP, he is not taking Coreg and Lisinopril.      History of Present Illness: Trevor [name] is a 72 y.o. male who presents for a followup visit regarding coronary artery disease status post  CABG,  ischemic cardiomyopathy and peripheral arterial disease. He presented in April of 2014 with anterior ST elevation myocardial infarction and was found to have significant three-vessel coronary artery disease and moderate left main stenosis. The patient underwent CABG at that time.  Echocardiogram in July of 2014 showed improved LV systolic function with an ejection fraction of 45-50% with apical hypokinesis. He underwent a nuclear stress test in December 2016 due to worsening exertional dyspnea. The test showed fixed inferior wall defect without significant ischemia. Ejection fraction was 53%. Overall it was a low risk study.  He is known to have peripheral arterial disease with moderately reduced ABI on the right due to severe SFA disease. He is being treated medically due to lack of claudication.  He was hospitalized in August, 2018 with severe symptomatic anemia with hemoglobin of 5.6. He was also found to have mildly elevated troponin consistent with non-ST elevation myocardial infarction. The patient was transfused. He underwent cardiac catheterization which showed occluded SVG to RCA and SVG to OM. His LIMA to LAD was patent. Ejection fraction was 20-25%. No revascularization could be done with PCI given his symptomatic anemia and also the fact that he was found to have renal mass highly suggestive of cancer with pulmonary nodules. The patient was treated medically. He returned 2 days later with a stroke which was  confirmed by MRI.  He was treated medically for coronary artery disease.  He had a repeat echocardiogram done in September which showed an EF of 45-50% with mild mitral regurgitation.  Carotid Doppler showed moderate bilateral disease worse on the right side.  He underwent left nephrectomy in December.  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  Unfortunately, he has been having frequent diarrhea and nausea.  His appetite has been poor.  He had an appointment last week with oncology and his blood pressure was 78.  Both lisinopril and carvedilol were stopped.  He was given 1 L of normal saline. He has been feeling extremely weak.  Yesterday, he stood to go to the bathroom and felt extremely dizzy and passed out briefly.  He was assisted by his sister.  No chest pain or shortness of breath.  Past Medical History:  Diagnosis Date  . 3-vessel coronary artery disease   . Anemia    2 iron infusions  . Arthritis   . Blood in urine    blood clots noted since biopsy  . Cancer ([place])    Left kidney cancer  . Carotid disease, bilateral ([facility])    moderate  . Coronary artery disease    06/2012: Anterior ST elevation myocardial infarction. Cardiac catheterization showed significant three-vessel coronary artery disease, ejection fraction 35-40%. The patient underwent CABG with LIMA to LAD, SVG to ramus and sequential SVG to right PDA/PL  . Diabetes mellitus without complication ([place])   . Diarrhea   . Dyspnea  mild  . Ejection fraction < 50%   . Frequent urination    comes and goes since biospy  . History of blood transfusion   . Hyperlipidemia   . Ischemic cardiomyopathy    Ejection fraction of 35-40% initially, 45 - 50% on echo 2014, 53% by perfusion study 2016  . Mild mitral regurgitation   . Non-ST elevated myocardial infarction (non-STEMI) ([place]) 10/2016  . Numbness    Right side since stroke  . OA (osteoarthritis)   . PAD  (peripheral artery disease) ([place])   . Pulmonary nodule   . STEMI (ST elevation myocardial infarction) ([place]) 06/2012   anterior   . Stroke ([place]) 2018  . TIA (transient ischemic attack) 10/2016  . Weakness generalized   . Wears glasses     Past Surgical History:  Procedure Laterality Date  . CARDIOVASCULAR STRESS TEST    . CORONARY ARTERY BYPASS GRAFT N/A 07/05/2012   Procedure: CORONARY ARTERY BYPASS GRAFTING (CABG);  Surgeon: Ivin Poot, MD;  Location: [facility];  Service: Open Heart Surgery;  Laterality: N/A;  . INTRAOPERATIVE TRANSESOPHAGEAL ECHOCARDIOGRAM N/A 07/05/2012   Procedure: INTRAOPERATIVE TRANSESOPHAGEAL ECHOCARDIOGRAM;  Surgeon: Ivin Poot, MD;  Location: [facility];  Service: Open Heart Surgery;  Laterality: N/A;  . LEFT HEART CATH AND CORS/GRAFTS ANGIOGRAPHY N/A 11/14/2016   Procedure: LEFT HEART CATH AND CORS/GRAFTS ANGIOGRAPHY;  Surgeon: Belva Crome, MD;  Location: [facility];  Service: Cardiovascular;  Laterality: N/A;  . LEFT HEART CATHETERIZATION WITH CORONARY ANGIOGRAM N/A 07/04/2012   Procedure: LEFT HEART CATHETERIZATION WITH CORONARY ANGIOGRAM;  Surgeon: Wellington Hampshire, MD;  Location: [facility];  Service: Cardiovascular;  Laterality: N/A;  . RENAL BIOPSY    . ROBOT ASSISTED LAPAROSCOPIC NEPHRECTOMY Left 02/21/2017   Procedure: XI ROBOTIC ASSISTED LAPAROSCOPIC NEPHRECTOMY;  Surgeon: Alexis Frock, MD;  Location: [facility];  Service: Urology;  Laterality: Left;     Current Outpatient Medications  Medication Sig Dispense Refill  . atorvastatin (LIPITOR) 40 MG tablet Take 1 tablet (40 mg total) by mouth daily. 30 tablet 6  . cabozantinib S-Malate 40 MG TABS Take 40 mg by mouth daily. Take on an empty stomach 1 hour before or 2 hours after a meal. 30 tablet 1  . ferrous sulfate 325 (65 FE) MG tablet Take 325 mg by mouth daily with breakfast.    . HYDROcodone-acetaminophen (NORCO) 5-325 MG tablet Take 1-2 tablets by mouth every 6 (six) hours as needed for  moderate pain or severe pain. 30 tablet 0  . metFORMIN (GLUCOPHAGE) 1000 MG tablet Take 1,000 mg by mouth 2 (two) times daily.   0  . pantoprazole (PROTONIX) 40 MG tablet Take 1 tablet (40 mg total) by mouth at bedtime. 30 tablet 0  . prochlorperazine (COMPAZINE) 10 MG tablet Take 1 tablet (10 mg total) by mouth every 6 (six) hours as needed for nausea or vomiting. 30 tablet 0  . senna-docusate (SENOKOT-S) 8.6-50 MG tablet Take 1 tablet by mouth 2 (two) times daily. While taking strong pain meds to prevent constipation. 20 tablet 0  . carvedilol (COREG) 3.125 MG tablet Take 1 tablet (3.125 mg total) by mouth 2 (two) times daily. (Patient not taking: Reported on 04/17/2017) 180 tablet 2  . lisinopril (PRINIVIL,ZESTRIL) 5 MG tablet Take 1 tablet (5 mg total) daily by mouth. (Patient not taking: Reported on 04/17/2017) 90 tablet 3   No current facility-administered medications for this visit.     Allergies:   Patient has no known  allergies.    Social History:  The patient  reports that he quit smoking about 4 years ago. He has a 50.00 pack-year smoking history. he has never used smokeless tobacco. He reports that he does not drink alcohol or use drugs.   Family History:  The patient's family history includes Cancer in his maternal grandfather and mother; Heart attack (age of onset: 2) in his brother; Stroke in his mother.    ROS:  Please see the history of present illness.   Otherwise, review of systems are positive for none.   All other systems are reviewed and negative.    PHYSICAL EXAM: VS:  BP (!) 66/50   Ht 5\' 11"  (1.803 m)   Wt 152 lb 3.2 oz (69 kg)   BMI 21.23 kg/m  , BMI Body mass index is 21.23 kg/m. GEN: Well nourished, well developed, in no acute distress  HEENT: normal  Neck: no JVD, or masses. Bilateral carotid bruits Cardiac: RRR; no murmurs, rubs, or gallops,no edema  Respiratory:  clear to auscultation bilaterally, normal work of breathing GI: soft, nontender,  nondistended, + BS MS: no deformity or atrophy  Skin: warm and dry, no rash Neuro:  Strength and sensation are intact Psych: euthymic mood, full affect   EKG:  EKG is  ordered today. EKG showed normal sinus rhythm with PACs.  Nonspecific ST and T wave changes.   Recent Labs: 11/22/2016: Magnesium 1.7 04/11/2017: ALT 117; BUN 32; Creatinine, Ser 1.48; Hemoglobin 12.8; Platelets 210; Potassium 5.1; Sodium 138    Lipid Panel    Component Value Date/Time   CHOL 77 11/20/2016 0704   TRIG 64 11/20/2016 0704   HDL 30 (L) 11/20/2016 0704   CHOLHDL 2.6 11/20/2016 0704   VLDL 13 11/20/2016 0704   LDLCALC 34 11/20/2016 0704      Wt Readings from Last 3 Encounters:  04/17/17 152 lb 3.2 oz (69 kg)  04/11/17 148 lb 11.2 oz (67.4 kg)  03/22/17 157 lb 3.2 oz (71.3 kg)         ASSESSMENT AND PLAN:  1.  Severe hypotension with syncope: Likely due to volume depletion.  I suspect acute renal failure.  Due to this, the patient will need to be transferred to the emergency department for admission and hydration. He denies fevers or chills.  Low suspicion for septic shock but that has to be excluded.  2. Coronary artery disease: Status post CABG. Status post non-ST elevation myocardial infarction in August of 2017 in the setting of severe symptomatic anemia.  In spite of significant hypotension, the patient denies any chest pain or significant dyspnea at this time.  3. Ischemic cardiomyopathy: Most recent ejection fraction was 45-50%.  Lisinopril and carvedilol are both on hold due to hypotension.  4. Peripheral arterial disease: The patient has evidence of severe right mid SFA stenosis . He denies claudication at this time. Continue medical therapy.   5. Hyperlipidemia: Continue treatment with atorvastatin with a target LDL of less than 70.  6. Moderate bilateral carotid disease.Continue medical therapy and repeat carotid Doppler in 1 year.   Disposition:   Transfer to the emergency  department for admission due to hypotension and syncope.  The patient likely requires admission to hospitalist with oncology consultation.  Signed,  [name], MD  04/17/2017 8:07 AM    [place]

## 2017-04-17 NOTE — ED Provider Notes (Signed)
[name] 6E PROGRESSIVE CARE Provider Note   CSN: [id number] Arrival date & time: 04/17/17  0910     History   Chief Complaint Chief Complaint  Patient presents with  . Weakness    HPI Trevor [name] is a 72 y.o. male.  The history is provided by the patient and the EMS personnel. No language interpreter was used.   Trevor [name] is a 72 y.o. male who presents to the Emergency Department complaining of weakness, hypotension.  He presents via EMS from his cardiology appointment today for evaluation of weakness and hypotension.  He has a history of renal cell carcinoma status post resection and is currently on chemotherapy.  His therapy was restarted about 3 weeks ago.  Over the last week he is experienced diarrhea with 3-5 watery brown stools daily.  He endorses decreased oral intake with nausea.  He vomited twice over the last week.  2 days ago he became weak and fell to the ground.  He has been checking his blood pressure at home and is gone down to 50 systolic.  He was seen in the office about a week ago and was noted to have low blood pressure and was treated with IV fluids and his plan to return tomorrow for repeat IV fluids.  Overall he states that the diarrhea is improving but he continues to feel weak.  In the cardiology office today his blood pressure was down to 60/40.  He denies any fevers, chest pain, shortness of breath, abdominal pain, hematochezia, melena, leg swelling or pain. Past Medical History:  Diagnosis Date  . 3-vessel coronary artery disease   . Anemia    2 iron infusions  . Arthritis   . Carotid disease, bilateral (HCC)    moderate  . Chronic systolic heart failure (HCC)    EF 45%  . Coronary artery disease    06/2012:STEMI s/p CABG; 8/18 NSTEMI  . Diabetes mellitus without complication ([place])   . Diarrhea   . Dyspnea    mild  . History of blood transfusion   . Hyperlipidemia   . Ischemic cardiomyopathy    Ejection fraction of 35-40% initially, 45 - 50%  on echo 2014, 53% by perfusion study 2016  . Mild mitral regurgitation   . Numbness    Right side since stroke  . OA (osteoarthritis)   . PAD (peripheral artery disease) ([place])   . Pulmonary nodule   . Renal cell cancer, left ([place]) 12/2016   Associated w/ pulmonary nodules  . Stroke ([place]) 2018  . TIA (transient ischemic attack) 10/2016  . Weakness generalized   . Wears glasses     Patient Active Problem List   Diagnosis Date Noted  . Acute kidney injury ([place]) 04/17/2017  . History of CVA (cerebrovascular accident) 04/17/2017  . Severe diarrhea 04/17/2017  . 3-vessel coronary artery disease 04/17/2017  . Chronic combined systolic and diastolic heart failure ([place]) 04/17/2017  . Diabetes mellitus type 2 in nonobese ([place]) 04/17/2017  . Chronic systolic heart failure ([place])   . Dehydration 04/11/2017  . Nausea without vomiting 04/11/2017  . Elevated LFTs 04/11/2017  . Encounter for antineoplastic chemotherapy 02/05/2017  . Renal cell cancer ([place]) 01/18/2017  . Renal cell carcinoma ([place]) 01/15/2017  . Renal cell cancer, left ([place]) 12/18/2016  . Renal mass 12/06/2016  . 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  NSTEMI (non-ST elevated myocardial infarction) ([place])   . Anemia 11/11/2016  . Iron deficiency anemia due to chronic blood loss 09/20/2015  . Recent weight loss 09/20/2015  . Bilateral leg pain 02/21/2015  . Coronary artery disease   . Ischemic cardiomyopathy   . Hyperlipidemia   . ST elevation myocardial infarction (STEMI) of anterior wall, initial episode of care ([place]) 07/04/2012  . Hyperglycemia 07/04/2012    Past Surgical History:  Procedure Laterality Date  . CARDIOVASCULAR STRESS TEST    . CORONARY ARTERY BYPASS GRAFT N/A 07/05/2012   Procedure: CORONARY ARTERY BYPASS GRAFTING (CABG);  Surgeon:  Ivin Poot, MD;  Location: [facility];  Service: Open Heart Surgery;  Laterality: N/A;  . INTRAOPERATIVE TRANSESOPHAGEAL ECHOCARDIOGRAM N/A 07/05/2012   Procedure: INTRAOPERATIVE TRANSESOPHAGEAL ECHOCARDIOGRAM;  Surgeon: Ivin Poot, MD;  Location: [facility];  Service: Open Heart Surgery;  Laterality: N/A;  . LEFT HEART CATH AND CORS/GRAFTS ANGIOGRAPHY N/A 11/14/2016   Procedure: LEFT HEART CATH AND CORS/GRAFTS ANGIOGRAPHY;  Surgeon: Belva Crome, MD;  Location: [facility];  Service: Cardiovascular;  Laterality: N/A;  . LEFT HEART CATHETERIZATION WITH CORONARY ANGIOGRAM N/A 07/04/2012   Procedure: LEFT HEART CATHETERIZATION WITH CORONARY ANGIOGRAM;  Surgeon: Wellington Hampshire, MD;  Location: [facility];  Service: Cardiovascular;  Laterality: N/A;  . RENAL BIOPSY    . ROBOT ASSISTED LAPAROSCOPIC NEPHRECTOMY Left 02/21/2017   Procedure: XI ROBOTIC ASSISTED LAPAROSCOPIC NEPHRECTOMY;  Surgeon: Alexis Frock, MD;  Location: [facility];  Service: Urology;  Laterality: Left;       Home Medications    Prior to Admission medications   Medication Sig Start Date End Date Taking? Authorizing Provider  aspirin EC 81 MG tablet Take 81 mg by mouth daily.   Yes [provider]  atorvastatin (LIPITOR) 40 MG tablet Take 1 tablet (40 mg total) by mouth daily. 05/20/13  Yes Wellington Hampshire, MD  carvedilol (COREG) 3.125 MG tablet Take 1 tablet (3.125 mg total) by mouth 2 (two) times daily. 08/24/15  Yes Wellington Hampshire, MD  ferrous sulfate 325 (65 FE) MG tablet Take 325 mg by mouth daily with breakfast.   Yes [provider]  lisinopril (PRINIVIL,ZESTRIL) 5 MG tablet Take 1 tablet (5 mg total) daily by mouth. 01/30/17  Yes Wellington Hampshire, MD  loperamide (IMODIUM) 2 MG capsule Take 4 mg by mouth as needed for diarrhea or loose stools.   Yes [provider]  metFORMIN (GLUCOPHAGE) 1000 MG tablet Take 1,000 mg by mouth 2 (two) times daily.  11/19/15  Yes [provider]    pantoprazole (PROTONIX) 40 MG tablet Take 1 tablet (40 mg total) by mouth at bedtime. 11/22/16  Yes Patteson, Arlan Organ, NP  prochlorperazine (COMPAZINE) 10 MG tablet Take 1 tablet (10 mg total) by mouth every 6 (six) hours as needed for nausea or vomiting. 04/11/17  Yes Curcio, Roselie Awkward, NP  senna-docusate (SENOKOT-S) 8.6-50 MG tablet Take 1 tablet by mouth 2 (two) times daily. While taking strong pain meds to prevent constipation. 02/23/17  Yes Alexis Frock, MD  cabozantinib S-Malate 40 MG TABS Take 40 mg by mouth daily. Take on an empty stomach 1 hour before or 2 hours after a meal. 04/11/17   Curt Bears, MD  HYDROcodone-acetaminophen [facility]) 5-325 MG tablet Take 1-2 tablets by mouth every 6 (six) hours as needed for moderate pain or severe pain. Patient not taking: Reported on 04/17/2017 02/21/17   Debbrah Alar, PA-C    Family History  Family History  Problem Relation Age of Onset  . Cancer Mother        leukemia  . Stroke Mother   . Cancer Maternal Grandfather        possible cancer  . Heart attack Brother 46    Social History Social History   Tobacco Use  . Smoking status: Former Smoker    Packs/day: 1.00    Years: 50.00    Pack years: 50.00    Last attempt to quit: 04/20/2012    Years since quitting: 4.9  . Smokeless tobacco: Never Used  Substance Use Topics  . Alcohol use: No  . Drug use: No     Allergies   Patient has no known allergies.   Review of Systems Review of Systems  All other systems reviewed and are negative.    Physical Exam Updated Vital Signs BP 131/63   Pulse 66   Temp 97.6 F (36.4 C) (Oral)   Resp 17   Ht 5\' 11"  (1.803 m)   Wt 68.9 kg (152 lb)   SpO2 97%   BMI 21.20 kg/m   Physical Exam  Constitutional: He is oriented to person, place, and time. He appears well-developed.  Thin  HENT:  Head: Normocephalic and atraumatic.  Cardiovascular: Regular rhythm.  No murmur heard. Tachycardic  Pulmonary/Chest: Effort normal and  breath sounds normal. No respiratory distress.  Abdominal: Soft. There is no tenderness. There is no rebound and no guarding.  Musculoskeletal: He exhibits no edema or tenderness.  Neurological: He is alert and oriented to person, place, and time.  Skin: Skin is warm and dry. There is pallor.  Psychiatric: He has a normal mood and affect. His behavior is normal.  Nursing note and vitals reviewed.    ED Treatments / Results  Labs (all labs ordered are listed, but only abnormal results are displayed) Labs Reviewed  COMPREHENSIVE METABOLIC PANEL - Abnormal; Notable for the following components:      Result Value   Potassium 5.4 (*)    CO2 19 (*)    Glucose, Bld 133 (*)    BUN 42 (*)    Creatinine, Ser 3.25 (*)    Total Protein 6.1 (*)    Albumin 3.3 (*)    GFR calc non Af Amer 18 (*)    GFR calc Af Amer 21 (*)    All other components within normal limits  CBC WITH DIFFERENTIAL/PLATELET - Abnormal; Notable for the following components:   RBC 4.01 (*)    Hemoglobin 11.4 (*)    HCT 34.6 (*)    RDW 21.5 (*)    All other components within normal limits  LACTIC ACID, PLASMA - Abnormal; Notable for the following components:   Lactic Acid, Venous 3.0 (*)    All other components within normal limits  I-STAT CG4 LACTIC ACID, ED - Abnormal; Notable for the following components:   Lactic Acid, Venous 4.11 (*)    All other components within normal limits  I-STAT CHEM 8, ED - Abnormal; Notable for the following components:   Potassium 5.4 (*)    BUN 40 (*)    Creatinine, Ser 3.10 (*)    Glucose, Bld 127 (*)    TCO2 21 (*)    Hemoglobin 11.2 (*)    HCT 33.0 (*)    All other components within normal limits  I-STAT CG4 LACTIC ACID, ED - Abnormal; Notable for the following components:   Lactic Acid, Venous 2.63 (*)    All other  components within normal limits  CULTURE, BLOOD (ROUTINE X 2)  CULTURE, BLOOD (ROUTINE X 2)  URINE CULTURE  MRSA PCR SCREENING  C DIFFICILE QUICK SCREEN W PCR  REFLEX  GASTROINTESTINAL PANEL BY PCR, STOOL (REPLACES STOOL CULTURE)  URINALYSIS, ROUTINE W REFLEX MICROSCOPIC  CK  GLUCOSE, CAPILLARY  LACTIC ACID, PLASMA  COMPREHENSIVE METABOLIC PANEL  CBC  I-STAT TROPONIN, ED    EKG  EKG Interpretation  Date/Time:  Tuesday April 17 2017 09:27:48 EST Ventricular Rate:  91 PR Interval:    QRS Duration: 85 QT Interval:  322 QTC Calculation: 397 R Axis:   13 Text Interpretation:  Sinus rhythm Low voltage, precordial leads Nonspecific T abnrm, anterolateral leads Confirmed by Quintella Reichert [phone]) on 04/17/2017 9:31:29 AM       Radiology Dg Chest 2 View  Result Date: 04/17/2017 CLINICAL DATA:  Hypotension and weakness for several days EXAM: CHEST  2 VIEW COMPARISON:  12/25/2008 FINDINGS: Cardiac shadow is within normal limits. The lungs are well aerated bilaterally. Small nodular density is noted in the right upper lobe similar to that seen on prior PET-CT. Postsurgical changes are noted. No acute bony abnormality is seen. IMPRESSION: Stable right upper lobe nodule. No acute abnormality noted. Electronically Signed   By: Inez Catalina M.D.   On: 04/17/2017 10:07   US Renal  Result Date: 04/17/2017 CLINICAL DATA:  Acute renal failure EXAM: RENAL / URINARY TRACT ULTRASOUND COMPLETE COMPARISON:  12/27/2016 FINDINGS: Right Kidney: Length: 12.7. Echogenicity within normal limits. No mass or hydronephrosis visualized. Left Kidney: Surgically removed Bladder: Appears normal for degree of bladder distention. IMPRESSION: Status post left nephrectomy. No acute abnormality noted. Electronically Signed   By: Inez Catalina M.D.   On: 04/17/2017 14:05    Procedures Procedures (including critical care time) CRITICAL CARE Performed by: Quintella Reichert   Total critical care time: 35 minutes  Critical care time was exclusive of separately billable procedures and treating other patients.  Critical care was necessary to treat or prevent imminent or  life-threatening deterioration.  Critical care was time spent personally by me on the following activities: development of treatment plan with patient and/or surrogate as well as nursing, discussions with consultants, evaluation of patient's response to treatment, examination of patient, obtaining history from patient or surrogate, ordering and performing treatments and interventions, ordering and review of laboratory studies, ordering and review of radiographic studies, pulse oximetry and re-evaluation of patient's condition.  Medications Ordered in ED Medications  vancomycin (VANCOCIN) 50 mg/mL oral solution 500 mg (500 mg Oral Given 04/17/17 1215)  0.9 %  sodium chloride infusion (125 mL/hr Intravenous New Bag/Given 04/17/17 1331)    Followed by  0.9 %  sodium chloride infusion (not administered)  ondansetron (ZOFRAN) injection 4 mg (not administered)    Or  promethazine (PHENERGAN) injection 12.5 mg (not administered)  sodium chloride flush (NS) 0.9 % injection 3 mL (3 mLs Intravenous Not Given 04/17/17 1332)  acetaminophen (TYLENOL) tablet 650 mg (not administered)    Or  acetaminophen (TYLENOL) suppository 650 mg (not administered)  insulin aspart (novoLOG) injection 0-9 Units (0 Units Subcutaneous Not Given 04/17/17 1752)  insulin aspart (novoLOG) injection 0-5 Units (not administered)  enoxaparin (LOVENOX) injection 30 mg (not administered)  feeding supplement (BOOST / RESOURCE BREEZE) liquid 1 Container (not administered)  sodium chloride 0.9 % bolus 1,000 mL (0 mLs Intravenous Stopped 04/17/17 1120)  sodium chloride 0.9 % bolus 1,500 mL (0 mLs Intravenous Stopped 04/17/17 1125)  sodium chloride 0.9 % bolus  1,000 mL (0 mLs Intravenous Stopped 04/17/17 1456)  sodium chloride 0.9 % bolus 500 mL (500 mLs Intravenous New Bag/Given 04/17/17 1631)     Initial Impression / Assessment and Plan / ED Course  I have reviewed the triage vital signs and the nursing notes.  Pertinent labs & imaging  results that were available during my care of the patient were reviewed by me and considered in my medical decision making (see chart for details).     Pt here with weakness, hypotension.  He is nontoxic appearing on exam and mentating well despite his hypotension.  Initial lactate markedly elevated at 4.  He was treated with aggressive fluid hydration with resolution of his hypotension as well as improvement in his lactate.  Labs demonstrate AKI.  He was treated with flagyl for possible cdiff pending stool studies.  Abdominal exam is soft and nontender, no current concern for acute abdomen.   Medicine consulted for admission for further treatment.    Final Clinical Impressions(s) / ED Diagnoses   Final diagnoses:  AKI (acute kidney injury) [facility])    ED Discharge Orders    None       Quintella Reichert, MD 04/17/17 [id number]

## 2017-04-17 NOTE — ED Notes (Signed)
Pt attempts to void and is unable to do so.

## 2017-04-18 ENCOUNTER — Inpatient Hospital Stay (HOSPITAL_COMMUNITY): Payer: Medicare Other

## 2017-04-18 ENCOUNTER — Other Ambulatory Visit: Payer: Medicare Other

## 2017-04-18 ENCOUNTER — Ambulatory Visit: Payer: Medicare Other | Admitting: Oncology

## 2017-04-18 DIAGNOSIS — I2510 Atherosclerotic heart disease of native coronary artery without angina pectoris: Secondary | ICD-10-CM

## 2017-04-18 DIAGNOSIS — N179 Acute kidney failure, unspecified: Secondary | ICD-10-CM

## 2017-04-18 DIAGNOSIS — I5042 Chronic combined systolic (congestive) and diastolic (congestive) heart failure: Secondary | ICD-10-CM

## 2017-04-18 DIAGNOSIS — I5022 Chronic systolic (congestive) heart failure: Secondary | ICD-10-CM

## 2017-04-18 LAB — GASTROINTESTINAL PANEL BY PCR, STOOL (REPLACES STOOL CULTURE)

## 2017-04-18 LAB — COMPREHENSIVE METABOLIC PANEL
ALT: 29 U/L (ref 17–63)
AST: 24 U/L (ref 15–41)
Albumin: 2.5 g/dL — ABNORMAL LOW (ref 3.5–5.0)
Alkaline Phosphatase: 87 U/L (ref 38–126)
Anion gap: 7 (ref 5–15)
BUN: 37 mg/dL — ABNORMAL HIGH (ref 6–20)
CHLORIDE: 113 mmol/L — AB (ref 101–111)
CO2: 19 mmol/L — ABNORMAL LOW (ref 22–32)
CREATININE: 2.96 mg/dL — AB (ref 0.61–1.24)
Calcium: 7.8 mg/dL — ABNORMAL LOW (ref 8.9–10.3)
GFR calc non Af Amer: 20 mL/min — ABNORMAL LOW (ref >60)
GFR, EST AFRICAN AMERICAN: 23 mL/min — AB (ref >60)
Glucose, Bld: 78 mg/dL (ref 65–99)
POTASSIUM: 4.6 mmol/L (ref 3.5–5.1)
Sodium: 139 mmol/L (ref 135–145)
TOTAL PROTEIN: 4.7 g/dL — AB (ref 6.5–8.1)
Total Bilirubin: 0.7 mg/dL (ref 0.3–1.2)

## 2017-04-18 LAB — C DIFFICILE QUICK SCREEN W PCR REFLEX
C DIFFICILE (CDIFF) INTERP: NOT DETECTED
C DIFFICILE (CDIFF) TOXIN: NEGATIVE
C Diff antigen: NEGATIVE

## 2017-04-18 LAB — CBC
HCT: 28 % — ABNORMAL LOW (ref 39.0–52.0)
Hemoglobin: 9.1 g/dL — ABNORMAL LOW (ref 13.0–17.0)
MCH: 27.9 pg (ref 26.0–34.0)
MCHC: 32.5 g/dL (ref 30.0–36.0)
MCV: 85.9 fL (ref 78.0–100.0)
PLATELETS: 107 10*3/uL — AB (ref 150–400)
RBC: 3.26 MIL/uL — AB (ref 4.22–5.81)
RDW: 21.5 % — ABNORMAL HIGH (ref 11.5–15.5)
WBC: 5.1 10*3/uL (ref 4.0–10.5)

## 2017-04-18 LAB — GLUCOSE, CAPILLARY
GLUCOSE-CAPILLARY: 71 mg/dL (ref 65–99)
GLUCOSE-CAPILLARY: 91 mg/dL (ref 65–99)
GLUCOSE-CAPILLARY: 110 mg/dL — AB (ref 65–99)
GLUCOSE-CAPILLARY: 130 mg/dL — AB (ref 65–99)

## 2017-04-18 MED ORDER — LOPERAMIDE HCL 2 MG PO CAPS
4.0000 mg | ORAL_CAPSULE | Freq: Once | ORAL | Status: DC
  Filled 2017-04-18: qty 2

## 2017-04-18 MED ORDER — ENSURE ENLIVE PO LIQD
237.0000 mL | Freq: Three times a day (TID) | ORAL | Status: DC
  Administered 2017-04-18 – 2017-04-19 (×5): 237 mL via ORAL

## 2017-04-18 MED ORDER — LOPERAMIDE HCL 2 MG PO CAPS
2.0000 mg | ORAL_CAPSULE | Freq: Three times a day (TID) | ORAL | Status: DC | PRN
  Administered 2017-04-18: 2 mg via ORAL

## 2017-04-18 NOTE — Plan of Care (Signed)
Pt updated on plan of care with no concerns expressed by pt at this time. Enteric precautions continued. C-diff sampled resulted negative, GI panel pending. Pt denies pain throughout shift.

## 2017-04-18 NOTE — Progress Notes (Signed)
[facility]                                                                              Patient Demographics  Trevor [name], is a 72 y.o. male, DOB - [birth year], GDJ:[id number]  Admit date - 04/17/2017   Admitting Physician Karmen Bongo, MD  Outpatient Primary MD for the patient is Via, [name], MD  Outpatient specialists:   LOS - 1  days   Medical records reviewed and are as summarized below:    Chief Complaint  Patient presents with  . Weakness       Brief summary   Patient is a 72 year old male with history of CVA, PVD, CAD, ischemic cardiomyopathy, chronic combined systolic and diastolic CHF, diabetes.  He was diagnosed with left renal cell carcinoma in October 2018, subsequently underwent radical nephrectomy on 02/21/17.  He has been followed by Dr. Julien Nordmann with oncology and has been treated with Carbometyx oral chemotherapy.  He was started on chemotherapy 3 weeks ago and for the first 2 weeks had no significant symptoms.  Last week he developed significant high volume watery brown diarrhea multiple stools daily associated with anorexia dry heaves and generalized weakness.  He followed up at the oncology office on 1/23 and was given a liter of fluids and a prescription for Compazine.  He was also instructed to begin using Imodium as needed and was instructed to stop his Carbometyx as well as his blood pressure medications.  He presented to cardiology office for regular follow-up and was found to be significantly hypotensive with BP of 66/50 and was sent to ED. He was started on oral Flagyl by ED while awaiting C. difficile panel    Assessment & Plan    Principal Problem:   Acute kidney injury [facility]): Likely due to profound diarrhea and dehydration -Creatinine 3.25 at the time of admission with lactate of 4.1 -Patient was aggressively placed on IV fluid hydration, creatinine has improved to 2.9  Active Problems: Nausea, vomiting, diarrhea: Possibly due to  side effect from chemotherapy - Patient was seen in oncology clinic on 1/23 by Dr. Earlie Server, recommended to stop treatment with Cabometyx, patient was advised to not begin his treatment again prior to following with oncology. - He was recommended Compazine, Imodium. - Patient was started on Flagyl in ED, subsequently changed to oral vancomycin, C. difficile panel is negative, GI pathogen panel pending.  Will hold oral vancomycin for now as C. difficile is negative. - Placed on Imodium, obtain CT abdomen pelvis to rule out colitis, infectious versus inflammatory -Discussed with Dr. Julien Nordmann, patient's oncologist, recommended to stop cabometyx (this is usually side effect of the chemotherapy), hydration, symptomatic treatment and agreed with current management including CT abdomen.  No need for oncological evaluation at this time per Dr Earlie Server.  -Patient requesting solid diet and has nausea and vomiting is improving    Renal cell cancer, left [facility]) status post nephrectomy -Discussed as #2 with Dr. Julien Nordmann, recommended close follow-up after patient is discharged from the hospital  CAD with 3-vessel coronary artery disease -Currently no chest pain, recent cardiac catheterization was August 2018, treatment optimized -Has history of  severe PAD, on medical management  Combined systolic and diastolic CHF -Currently hypovolemic due to significant dehydration, follow volume status closely -2D echo 9/18 showed EF of 45-50% with grade 2 diastolic dysfunction   History of CVA -Patient was on baby aspirin and statin PTA -We will restart once patient is able to tolerate diet     Diabetes mellitus type 2 in nonobese ([place]) -Placed on carb modified diet -Continue sliding scale insulin   Code Status: Full CODE STATUS DVT Prophylaxis: Lovenox Family Communication: Discussed in detail with the patient, all imaging results, lab results explained to the patient   Disposition Plan: Once diarrhea  improved  Time Spent in minutes   35 minutes  Procedures:    Consultants:   Dr. Julien Nordmann via phone consultation  Antimicrobials:      Medications  Scheduled Meds: . enoxaparin (LOVENOX) injection  30 mg Subcutaneous Q24H  . feeding supplement  1 Container Oral TID BM  . insulin aspart  0-5 Units Subcutaneous QHS  . insulin aspart  0-9 Units Subcutaneous TID WC  . loperamide  4 mg Oral Once  . sodium chloride flush  3 mL Intravenous Q12H   Continuous Infusions: . sodium chloride 75 mL/hr at 04/17/17 2329   PRN Meds:.acetaminophen **OR** acetaminophen, loperamide, ondansetron (ZOFRAN) IV **OR** promethazine   Antibiotics   Anti-infectives (From admission, onward)   Start     Dose/Rate Route Frequency Ordered Stop   04/17/17 1200  vancomycin (VANCOCIN) 50 mg/mL oral solution 500 mg  Status:  Discontinued     500 mg Oral Every 6 hours 04/17/17 1119 04/18/17 1223   04/17/17 1200  metroNIDAZOLE (FLAGYL) IVPB 500 mg  Status:  Discontinued     500 mg 100 mL/hr over 60 Minutes Intravenous Every 8 hours 04/17/17 1120 04/17/17 1717   04/17/17 1130  metroNIDAZOLE (FLAGYL) IVPB 500 mg  Status:  Discontinued     500 mg 100 mL/hr over 60 Minutes Intravenous Every 8 hours 04/17/17 1119 04/17/17 1120   04/17/17 1030  metroNIDAZOLE (FLAGYL) IVPB 500 mg  Status:  Discontinued     500 mg 100 mL/hr over 60 Minutes Intravenous  Once 04/17/17 1022 04/17/17 1138        Subjective:   Mccauley Diehl was seen and examined today.  Per patient, nausea and vomiting significantly improved.  2 BMs this morning, loose and watery. Patient denies dizziness, chest pain, shortness of breath, abdominal pain.  No fever  Objective:   Vitals:   04/18/17 0001 04/18/17 0428 04/18/17 0742 04/18/17 1219  BP: 110/66 119/71 125/62 121/79  Pulse:  74 77 (!) 109  Resp:  12    Temp:  97.8 F (36.6 C) 97.7 F (36.5 C) 97.9 F (36.6 C)  TempSrc:  Oral Oral Oral  SpO2:  98% 100% 100%  Weight:  71.8 kg  (158 lb 3.2 oz)    Height:        Intake/Output Summary (Last 24 hours) at 04/18/2017 1335 Last data filed at 04/18/2017 1100 Gross per 24 hour  Intake 1768.75 ml  Output 100 ml  Net 1668.75 ml     Wt Readings from Last 3 Encounters:  04/18/17 71.8 kg (158 lb 3.2 oz)  04/17/17 69 kg (152 lb 3.2 oz)  04/11/17 67.4 kg (148 lb 11.2 oz)     Exam  General: Alert and oriented x 3, NAD  Eyes:   HEENT:  Atraumatic, normocephalic  Cardiovascular: S1 S2 auscultated, Regular rate and rhythm.  Respiratory: Clear to  auscultation bilaterally, no wheezing, rales or rhonchi  Gastrointestinal: Soft, nontender, nondistended, + bowel sounds  Ext: no pedal edema bilaterally  Neuro: no neuro deficits  Musculoskeletal: No digital cyanosis, clubbing  Skin: No rashes  Psych: Normal affect and demeanor, alert and oriented x3    Data Reviewed:  I have personally reviewed following labs and imaging studies  Micro Results Recent Results (from the past 240 hour(s))  C difficile quick scan w PCR reflex     Status: None   Collection Time: 04/17/17  3:25 PM  Result Value Ref Range Status   C Diff antigen NEGATIVE NEGATIVE Final   C Diff toxin NEGATIVE NEGATIVE Final   C Diff interpretation No C. difficile detected.  Final  Gastrointestinal Panel by PCR , Stool     Status: None   Collection Time: 04/17/17  3:25 PM  Result Value Ref Range Status   Campylobacter species NOT DETECTED NOT DETECTED Final   Plesimonas shigelloides NOT DETECTED NOT DETECTED Final   Salmonella species NOT DETECTED NOT DETECTED Final   Yersinia enterocolitica NOT DETECTED NOT DETECTED Final   Vibrio species NOT DETECTED NOT DETECTED Final   Vibrio cholerae NOT DETECTED NOT DETECTED Final   Enteroaggregative E coli (EAEC) NOT DETECTED NOT DETECTED Final   Enteropathogenic E coli (EPEC) NOT DETECTED NOT DETECTED Final   Enterotoxigenic E coli (ETEC) NOT DETECTED NOT DETECTED Final   Shiga like toxin producing E  coli (STEC) NOT DETECTED NOT DETECTED Final   Shigella/Enteroinvasive E coli (EIEC) NOT DETECTED NOT DETECTED Final   Cryptosporidium NOT DETECTED NOT DETECTED Final   Cyclospora cayetanensis NOT DETECTED NOT DETECTED Final   Entamoeba histolytica NOT DETECTED NOT DETECTED Final   Giardia lamblia NOT DETECTED NOT DETECTED Final   Adenovirus F40/41 NOT DETECTED NOT DETECTED Final   Astrovirus NOT DETECTED NOT DETECTED Final   Norovirus GI/GII NOT DETECTED NOT DETECTED Final   Rotavirus A NOT DETECTED NOT DETECTED Final   Sapovirus (I, II, IV, and V) NOT DETECTED NOT DETECTED Final    Comment: Performed at [facility] [place], [place] [place], [place] [address]  MRSA PCR Screening     Status: None   Collection Time: 04/17/17  4:58 PM  Result Value Ref Range Status   MRSA by PCR NEGATIVE NEGATIVE Final    Comment:        The GeneXpert MRSA Assay (FDA approved for NASAL specimens only), is one component of a comprehensive MRSA colonization surveillance program. It is not intended to diagnose MRSA infection nor to guide or monitor treatment for MRSA infections.     Radiology Reports Dg Chest 2 View  Result Date: 04/17/2017 CLINICAL DATA:  Hypotension and weakness for several days EXAM: CHEST  2 VIEW COMPARISON:  12/25/2008 FINDINGS: Cardiac shadow is within normal limits. The lungs are well aerated bilaterally. Small nodular density is noted in the right upper lobe similar to that seen on prior PET-CT. Postsurgical changes are noted. No acute bony abnormality is seen. IMPRESSION: Stable right upper lobe nodule. No acute abnormality noted. Electronically Signed   By: Inez Catalina M.D.   On: 04/17/2017 10:07   US Renal  Result Date: 04/17/2017 CLINICAL DATA:  Acute renal failure EXAM: RENAL / URINARY TRACT ULTRASOUND COMPLETE COMPARISON:  12/27/2016 FINDINGS: Right Kidney: Length: 12.7. Echogenicity within normal limits. No mass or hydronephrosis visualized. Left Kidney:  Surgically removed Bladder: Appears normal for degree of bladder distention. IMPRESSION: Status post left nephrectomy. No acute abnormality noted.  Electronically Signed   By: Inez Catalina M.D.   On: 04/17/2017 14:05    Lab Data:  CBC: Recent Labs  Lab 04/17/17 0921 04/17/17 0948 04/18/17 0423  WBC 8.0  --  5.1  NEUTROABS 5.7  --   --   HGB 11.4* 11.2* 9.1*  HCT 34.6* 33.0* 28.0*  MCV 86.3  --  85.9  PLT 161  --  761*   Basic Metabolic Panel: Recent Labs  Lab 04/17/17 0921 04/17/17 0948 04/18/17 0423  NA 137 138 139  K 5.4* 5.4* 4.6  CL 105 105 113*  CO2 19*  --  19*  GLUCOSE 133* 127* 78  BUN 42* 40* 37*  CREATININE 3.25* 3.10* 2.96*  CALCIUM 9.0  --  7.8*   GFR: Estimated Creatinine Clearance: 23.6 mL/min (A) (by C-G formula based on SCr of 2.96 mg/dL (H)). Liver Function Tests: Recent Labs  Lab 04/17/17 0921 04/18/17 0423  AST 40 24  ALT 43 29  ALKPHOS 112 87  BILITOT 1.1 0.7  PROT 6.1* 4.7*  ALBUMIN 3.3* 2.5*   No results for input(s): LIPASE, AMYLASE in the last 168 hours. No results for input(s): AMMONIA in the last 168 hours. Coagulation Profile: No results for input(s): INR, PROTIME in the last 168 hours. Cardiac Enzymes: Recent Labs  Lab 04/17/17 1150  CKTOTAL 81   BNP (last 3 results) No results for input(s): PROBNP in the last 8760 hours. HbA1C: No results for input(s): HGBA1C in the last 72 hours. CBG: Recent Labs  Lab 04/17/17 1750 04/17/17 2231 04/18/17 0741 04/18/17 1211  GLUCAP 82 106* 71 91   Lipid Profile: No results for input(s): CHOL, HDL, LDLCALC, TRIG, CHOLHDL, LDLDIRECT in the last 72 hours. Thyroid Function Tests: No results for input(s): TSH, T4TOTAL, FREET4, T3FREE, THYROIDAB in the last 72 hours. Anemia Panel: No results for input(s): VITAMINB12, FOLATE, FERRITIN, TIBC, IRON, RETICCTPCT in the last 72 hours. Urine analysis:    Component Value Date/Time   COLORURINE YELLOW 04/17/2017 1525   APPEARANCEUR CLEAR  04/17/2017 1525   LABSPEC 1.012 04/17/2017 1525   PHURINE 5.0 04/17/2017 1525   GLUCOSEU NEGATIVE 04/17/2017 1525   HGBUR NEGATIVE 04/17/2017 1525   BILIRUBINUR NEGATIVE 04/17/2017 1525   KETONESUR NEGATIVE 04/17/2017 1525   PROTEINUR NEGATIVE 04/17/2017 1525   UROBILINOGEN 0.2 07/04/2012 1852   NITRITE NEGATIVE 04/17/2017 1525   LEUKOCYTESUR NEGATIVE 04/17/2017 1525     Jatavion Peaster M.D. [facility] 04/18/2017, 1:35 PM  Pager: [phone] Between 7am to 7pm - call Pager - [phone]  After 7pm go to [email or web address] - password [email or web address]  Call night coverage person covering after 7pm

## 2017-04-18 NOTE — Progress Notes (Signed)
Initial Nutrition Assessment  DOCUMENTATION CODES:   Non-severe (moderate) malnutrition in context of chronic illness  INTERVENTION:    Ensure Enlive po TID, each supplement provides 350 kcal and 20 grams of protein  NUTRITION DIAGNOSIS:   Moderate Malnutrition related to chronic illness(CHF, cardiomyopathy, renal cell cancer) as evidenced by moderate fat depletion, moderate muscle depletion, percent weight loss(11% weight loss in 6 months).  GOAL:   Patient will meet greater than or equal to 90% of their needs  MONITOR:   PO intake, Supplement acceptance  REASON FOR ASSESSMENT:   Malnutrition Screening Tool    ASSESSMENT:   72 yo male with PMH of CAD, ischemic cardiomyopathy, HLD, DM TIA, PAD, stroke, CHF, renal cell cancer (currently being treated with oral chemotherapy) who was admitted on 1/29 with recurrent diarrhea and weakness, hypotension r/t hypovolemia.  Patient reports poor intake for the past few weeks due to diarrhea (5-6 times per day) caused by chemotherapy. He has been drinking 3 Ensure supplements per day.  Patient with 11% weight loss within the past 6 months.  NUTRITION - FOCUSED PHYSICAL EXAM:    Most Recent Value  Orbital Region  Moderate depletion  Upper Arm Region  Moderate depletion  Thoracic and Lumbar Region  Mild depletion  Buccal Region  Moderate depletion  Temple Region  Mild depletion  Clavicle Bone Region  Mild depletion  Clavicle and Acromion Bone Region  Mild depletion  Scapular Bone Region  Mild depletion  Dorsal Hand  Mild depletion  Patellar Region  Moderate depletion  Anterior Thigh Region  Moderate depletion  Posterior Calf Region  Mild depletion  Edema (RD Assessment)  None  Hair  Reviewed  Eyes  Reviewed  Mouth  Reviewed  Skin  Reviewed  Nails  Reviewed       Diet Order:  Diet Carb Modified Fluid consistency: Thin; Room service appropriate? Yes  EDUCATION NEEDS:   No education needs have been identified at this  time  Skin:  Skin Assessment: Reviewed RN Assessment  Last BM:  1/29  Height:   Ht Readings from Last 1 Encounters:  04/17/17 5\' 11"  (1.803 m)    Weight:   Wt Readings from Last 1 Encounters:  04/18/17 158 lb 3.2 oz (71.8 kg)    Ideal Body Weight:  78.2 kg  BMI:  Body mass index is 22.06 kg/m.  Estimated Nutritional Needs:   Kcal:  2000-2200  Protein:  100-120 gm  Fluid:  2 L   Molli Barrows, RD, LDN, [place] Pager [phone] After Hours Pager [phone]

## 2017-04-19 DIAGNOSIS — K529 Noninfective gastroenteritis and colitis, unspecified: Secondary | ICD-10-CM | POA: Diagnosis present

## 2017-04-19 DIAGNOSIS — E440 Moderate protein-calorie malnutrition: Secondary | ICD-10-CM

## 2017-04-19 LAB — CBC
HEMATOCRIT: 28 % — AB (ref 39.0–52.0)
HEMOGLOBIN: 9.1 g/dL — AB (ref 13.0–17.0)
MCH: 28.1 pg (ref 26.0–34.0)
MCHC: 32.5 g/dL (ref 30.0–36.0)
MCV: 86.4 fL (ref 78.0–100.0)
Platelets: 116 10*3/uL — ABNORMAL LOW (ref 150–400)
RBC: 3.24 MIL/uL — AB (ref 4.22–5.81)
RDW: 21.6 % — ABNORMAL HIGH (ref 11.5–15.5)
WBC: 5 10*3/uL (ref 4.0–10.5)

## 2017-04-19 LAB — GLUCOSE, CAPILLARY
GLUCOSE-CAPILLARY: 84 mg/dL (ref 65–99)
GLUCOSE-CAPILLARY: 109 mg/dL — AB (ref 65–99)
Glucose-Capillary: 102 mg/dL — ABNORMAL HIGH (ref 65–99)
Glucose-Capillary: 130 mg/dL — ABNORMAL HIGH (ref 65–99)

## 2017-04-19 LAB — BASIC METABOLIC PANEL
Anion gap: 9 (ref 5–15)
BUN: 28 mg/dL — AB (ref 6–20)
CHLORIDE: 112 mmol/L — AB (ref 101–111)
CO2: 19 mmol/L — ABNORMAL LOW (ref 22–32)
Calcium: 8.3 mg/dL — ABNORMAL LOW (ref 8.9–10.3)
Creatinine, Ser: 2.11 mg/dL — ABNORMAL HIGH (ref 0.61–1.24)
GFR calc Af Amer: 35 mL/min — ABNORMAL LOW (ref >60)
GFR calc non Af Amer: 30 mL/min — ABNORMAL LOW (ref >60)
GLUCOSE: 88 mg/dL (ref 65–99)
POTASSIUM: 4.8 mmol/L (ref 3.5–5.1)
SODIUM: 140 mmol/L (ref 135–145)

## 2017-04-19 LAB — URINE CULTURE: Culture: NO GROWTH

## 2017-04-19 MED ORDER — CIPROFLOXACIN IN D5W 400 MG/200ML IV SOLN
400.0000 mg | Freq: Two times a day (BID) | INTRAVENOUS | Status: DC
  Administered 2017-04-19 – 2017-04-20 (×3): 400 mg via INTRAVENOUS
  Filled 2017-04-19 (×3): qty 200

## 2017-04-19 MED ORDER — SODIUM CHLORIDE 0.9 % IV SOLN
INTRAVENOUS | Status: DC
Start: 2017-04-19 — End: 2017-04-20
  Administered 2017-04-19 (×2): via INTRAVENOUS

## 2017-04-19 MED ORDER — METRONIDAZOLE IN NACL 5-0.79 MG/ML-% IV SOLN
500.0000 mg | Freq: Three times a day (TID) | INTRAVENOUS | Status: DC
  Administered 2017-04-19 – 2017-04-20 (×4): 500 mg via INTRAVENOUS
  Filled 2017-04-19 (×5): qty 100

## 2017-04-19 NOTE — Progress Notes (Signed)
[facility]                                                                              Patient Demographics  Trevor [name], is a 72 y.o. male, DOB - 11/09/1945, XTG:[id number]  Admit date - 04/17/2017   Admitting Physician Karmen Bongo, MD  Outpatient Primary MD for the patient is Via, [name], MD  Outpatient specialists:   LOS - 2  days   Medical records reviewed and are as summarized below:    Chief Complaint  Patient presents with  . Weakness       Brief summary   Patient is a 72 year old male with history of CVA, PVD, CAD, ischemic cardiomyopathy, chronic combined systolic and diastolic CHF, diabetes.  He was diagnosed with left renal cell carcinoma in October 2018, subsequently underwent radical nephrectomy on 02/21/17.  He has been followed by Dr. Julien Nordmann with oncology and has been treated with Carbometyx oral chemotherapy.  He was started on chemotherapy 3 weeks ago and for the first 2 weeks had no significant symptoms.  Last week he developed significant high volume watery brown diarrhea multiple stools daily associated with anorexia dry heaves and generalized weakness.  He followed up at the oncology office on 1/23 and was given a liter of fluids and a prescription for Compazine.  He was also instructed to begin using Imodium as needed and was instructed to stop his Carbometyx as well as his blood pressure medications.  He presented to cardiology office for regular follow-up and was found to be significantly hypotensive with BP of 66/50 and was sent to ED. He was started on oral Flagyl by ED while awaiting C. difficile panel    Assessment & Plan    Principal Problem:   Acute kidney injury [facility]): Likely due to profound diarrhea and dehydration -Creatinine 3.25 at the time of admission with lactate of 4.1 -Creatinine slowly improving, still 2.1, continue IV fluid hydration, increase to 100 cc/hr.  Baseline creatinine 1.2  Active Problems: Nausea,  vomiting, diarrhea: Possibly due to side effect from chemotherapy - Patient was seen in oncology clinic on 1/23 by Dr. Earlie Server, recommended to stop treatment with Cabometyx, patient was advised to not begin his treatment again prior to following with oncology. He was recommended Compazine, Imodium. -Oral vancomycin discontinued on 1/30 as C. difficile was negative. -Discussed with Dr. Julien Nordmann, patient's oncologist on 1/30, recommended to stop cabometyx (this is usually side effect of the chemotherapy), hydration, symptomatic treatment and agreed with current management including CT abdomen. No need for oncological evaluation at this time per Dr Earlie Server.  -CT abdomen pelvis showed diffuse colitis involving the right colon, sigmoid colon with changes of enteritis involving the duodenum and proximal jejunum could be inflammatory or infectious in nature, interval 2.9 into 1.6 cm fluid collection in the left lateral subcutaneous fat, may be due to sterile postop fluid collection from robotic access. -Placed on IV ciprofloxacin and Flagyl, will likely need for 10-14 days.  Patient reports diarrhea is improving.   -If continues to improve overnight with no nausea, vomiting or diarrhea, will transition to oral ciprofloxacin and Flagyl in a.m. and possible  DC     Renal cell cancer, left (HCC) status post nephrectomy -Discussed as #2 with Dr. Julien Nordmann, recommended close follow-up after patient is discharged from the hospital  CAD with 3-vessel coronary artery disease -Currently no chest pain, recent cardiac catheterization was August 2018, treatment optimized -Has history of severe PAD, on medical management  Combined systolic and diastolic CHF -2D echo 6/73 showed EF of 45-50% with grade 2 diastolic dysfunction -Patient had profound dehydration at the time of admission, continue IV fluid hydration for now, monitor creatinine function   History of CVA -Patient was on baby aspirin and statin PTA -We  will restart once patient is able to tolerate diet     Diabetes mellitus type 2 in nonobese ([place]) -Placed on carb modified diet -Continue sliding scale insulin  Moderate protein calorie malnutrition in the context of chronic illness, malignancy and colitis -Nutrition consulted  Code Status: Full CODE STATUS DVT Prophylaxis: Lovenox Family Communication: Discussed in detail with the patient, all imaging results, lab results explained to the patient   Disposition Plan: Hopefully in a.m if improving  Time Spent in minutes 25 minutes  Procedures:    Consultants:   Dr. Julien Nordmann via phone consultation  Antimicrobials:   IV ciprofloxacin 1/31  IV Flagyl 1/31   Medications  Scheduled Meds: . enoxaparin (LOVENOX) injection  30 mg Subcutaneous Q24H  . feeding supplement (ENSURE ENLIVE)  237 mL Oral TID BM  . insulin aspart  0-5 Units Subcutaneous QHS  . insulin aspart  0-9 Units Subcutaneous TID WC  . loperamide  4 mg Oral Once  . sodium chloride flush  3 mL Intravenous Q12H   Continuous Infusions: . sodium chloride 100 mL/hr at 04/19/17 1004  . ciprofloxacin Stopped (04/19/17 0755)  . metronidazole Stopped (04/19/17 0745)   PRN Meds:.acetaminophen **OR** acetaminophen, loperamide, ondansetron (ZOFRAN) IV **OR** promethazine   Antibiotics   Anti-infectives (From admission, onward)   Start     Dose/Rate Route Frequency Ordered Stop   04/19/17 0630  ciprofloxacin (CIPRO) IVPB 400 mg     400 mg 200 mL/hr over 60 Minutes Intravenous Every 12 hours 04/19/17 0623     04/19/17 0630  metroNIDAZOLE (FLAGYL) IVPB 500 mg     500 mg 100 mL/hr over 60 Minutes Intravenous Every 8 hours 04/19/17 0623     04/17/17 1200  vancomycin (VANCOCIN) 50 mg/mL oral solution 500 mg  Status:  Discontinued     500 mg Oral Every 6 hours 04/17/17 1119 04/18/17 1223   04/17/17 1200  metroNIDAZOLE (FLAGYL) IVPB 500 mg  Status:  Discontinued     500 mg 100 mL/hr over 60 Minutes Intravenous Every 8  hours 04/17/17 1120 04/17/17 1717   04/17/17 1130  metroNIDAZOLE (FLAGYL) IVPB 500 mg  Status:  Discontinued     500 mg 100 mL/hr over 60 Minutes Intravenous Every 8 hours 04/17/17 1119 04/17/17 1120   04/17/17 1030  metroNIDAZOLE (FLAGYL) IVPB 500 mg  Status:  Discontinued     500 mg 100 mL/hr over 60 Minutes Intravenous  Once 04/17/17 1022 04/17/17 1138        Subjective:   Evert Wenrich was seen and examined today.  Per patient diarrhea is now improving.  No fevers or chills.  Discussed CT results with the patient.  Patient denies dizziness, chest pain, shortness of breath, abdominal pain.  No fevers overnight.  Objective:   Vitals:   04/19/17 0000 04/19/17 0422 04/19/17 0751 04/19/17 1220  BP: 120/68 121/71 127/83 123/62  Pulse: 84 74 77 100  Resp: 16 16 20 18   Temp: 97.8 F (36.6 C) 97.7 F (36.5 C) (!) 97.5 F (36.4 C) 98 F (36.7 C)  TempSrc:   Oral Oral  SpO2: 99% 99% 100% 100%  Weight:  71.9 kg (158 lb 8 oz)    Height:        Intake/Output Summary (Last 24 hours) at 04/19/2017 1301 Last data filed at 04/19/2017 0800 Gross per 24 hour  Intake 2598 ml  Output -  Net 2598 ml     Wt Readings from Last 3 Encounters:  04/19/17 71.9 kg (158 lb 8 oz)  04/17/17 69 kg (152 lb 3.2 oz)  04/11/17 67.4 kg (148 lb 11.2 oz)     Exam   General: Alert and oriented x 3, NAD  Eyes:   HEENT:  Atraumatic, normocephalic  Cardiovascular: S1 S2 auscultated, no rubs, murmurs or gallops. Regular rate and rhythm. No pedal edema b/l  Respiratory: Clear to auscultation bilaterally, no wheezing, rales or rhonchi  Gastrointestinal: Soft, mild diffuse tenderness, improving, nondistended, + bowel sounds  Ext: no pedal edema bilaterally  Neuro: no new deficit  Musculoskeletal: No digital cyanosis, clubbing  Skin: No rashes  Psych: Normal affect and demeanor, alert and oriented x3    Data Reviewed:  I have personally reviewed following labs and imaging studies  Micro  Results Recent Results (from the past 240 hour(s))  Culture, blood (routine x 2)     Status: None (Preliminary result)   Collection Time: 04/17/17 10:30 AM  Result Value Ref Range Status   Specimen Description BLOOD LEFT FOREARM  Final   Special Requests   Final    BOTTLES DRAWN AEROBIC AND ANAEROBIC Blood Culture adequate volume   Culture NO GROWTH 2 DAYS  Final   Report Status PENDING  Incomplete  Culture, blood (routine x 2)     Status: None (Preliminary result)   Collection Time: 04/17/17 11:15 AM  Result Value Ref Range Status   Specimen Description BLOOD RIGHT ANTECUBITAL  Final   Special Requests   Final    BOTTLES DRAWN AEROBIC AND ANAEROBIC Blood Culture adequate volume   Culture NO GROWTH 2 DAYS  Final   Report Status PENDING  Incomplete  Urine culture     Status: None   Collection Time: 04/17/17  3:25 PM  Result Value Ref Range Status   Specimen Description URINE, RANDOM  Final   Special Requests NONE  Final   Culture NO GROWTH  Final   Report Status 04/19/2017 FINAL  Final  C difficile quick scan w PCR reflex     Status: None   Collection Time: 04/17/17  3:25 PM  Result Value Ref Range Status   C Diff antigen NEGATIVE NEGATIVE Final   C Diff toxin NEGATIVE NEGATIVE Final   C Diff interpretation No C. difficile detected.  Final  Gastrointestinal Panel by PCR , Stool     Status: None   Collection Time: 04/17/17  3:25 PM  Result Value Ref Range Status   Campylobacter species NOT DETECTED NOT DETECTED Final   Plesimonas shigelloides NOT DETECTED NOT DETECTED Final   Salmonella species NOT DETECTED NOT DETECTED Final   Yersinia enterocolitica NOT DETECTED NOT DETECTED Final   Vibrio species NOT DETECTED NOT DETECTED Final   Vibrio cholerae NOT DETECTED NOT DETECTED Final   Enteroaggregative E coli (EAEC) NOT DETECTED NOT DETECTED Final   Enteropathogenic E coli (EPEC) NOT DETECTED NOT DETECTED Final   Enterotoxigenic  E coli (ETEC) NOT DETECTED NOT DETECTED Final    Shiga like toxin producing E coli (STEC) NOT DETECTED NOT DETECTED Final   Shigella/Enteroinvasive E coli (EIEC) NOT DETECTED NOT DETECTED Final   Cryptosporidium NOT DETECTED NOT DETECTED Final   Cyclospora cayetanensis NOT DETECTED NOT DETECTED Final   Entamoeba histolytica NOT DETECTED NOT DETECTED Final   Giardia lamblia NOT DETECTED NOT DETECTED Final   Adenovirus F40/41 NOT DETECTED NOT DETECTED Final   Astrovirus NOT DETECTED NOT DETECTED Final   Norovirus GI/GII NOT DETECTED NOT DETECTED Final   Rotavirus A NOT DETECTED NOT DETECTED Final   Sapovirus (I, II, IV, and V) NOT DETECTED NOT DETECTED Final    Comment: Performed at [facility], [address] [place], [place] [address]  MRSA PCR Screening     Status: None   Collection Time: 04/17/17  4:58 PM  Result Value Ref Range Status   MRSA by PCR NEGATIVE NEGATIVE Final    Comment:        The GeneXpert MRSA Assay (FDA approved for NASAL specimens only), is one component of a comprehensive MRSA colonization surveillance program. It is not intended to diagnose MRSA infection nor to guide or monitor treatment for MRSA infections.     Radiology Reports Ct Abdomen Pelvis Wo Contrast  Result Date: 04/18/2017 CLINICAL DATA:  Nausea, vomiting, diarrhea and generalized weakness for the past week. Status post left nephrectomy on 02/21/2017. EXAM: CT ABDOMEN AND PELVIS WITHOUT CONTRAST TECHNIQUE: Multidetector CT imaging of the abdomen and pelvis was performed following the standard protocol without IV contrast. COMPARISON:  Abdomen and pelvis CT dated 11/12/2016, abdomen MR dated 11/16/2016 and PET-CT dated 12/27/2016. FINDINGS: Lower chest: The previously demonstrated multiple nodules at the right lung base are significantly smaller. The largest previously measured 10 mm on 12/27/2016, currently 4 mm on image number 8 of series 4. Hepatobiliary: No focal liver abnormality is seen. No gallstones, gallbladder wall thickening,  or biliary dilatation. Pancreas: Unremarkable. No pancreatic ductal dilatation or surrounding inflammatory changes. Spleen: Normal in size without focal abnormality. Adrenals/Urinary Tract: Normal appearing right adrenal gland, kidney and ureter. Normal appearing bladder. Surgically absent left kidney. Small amount of fluid in the left nephrectomy bed and adjacent to the spleen without clear visualization of the adrenal gland. Stomach/Bowel: Interval mild diffuse wall thickening involving the right colon and sigmoid colon with mild pericolonic soft tissue stranding. Normal appearing appendix. There is also interval concentric wall thickening involving the 2nd, 3rd and 4th portions of the duodenum as well as multiple proximal jejunal loops. Vascular/Lymphatic: Atheromatous arterial calcifications without aneurysm. No enlarged lymph nodes. Reproductive: Moderately enlarged prostate gland. Other: There is an interval oval fluid collection in the left lower abdominal/upper pelvic subcutaneous fat laterally, measuring 2.9 x 1.6 cm on image number 53 of series 3, with adjacent subcutaneous soft tissue stranding. No abdominal wall hernia or abnormality. Small amount of free peritoneal fluid. Musculoskeletal: Lumbar and lower thoracic spine degenerative changes and scoliosis. IMPRESSION: 1. Interval changes of colitis involving the right colon and sigmoid colon with changes of enteritis involving the duodenum and proximal jejunum. This could be inflammatory or infectious in nature. Embolic ischemic colitis and enteritis is less likely but not excluded. 2. Interval 2.9 x 1.6 cm fluid collection in the left lateral subcutaneous fat with adjacent soft tissue stranding. This may represent a sterile postoperative fluid collection from robotic access. Infected fluid cannot be excluded. 3. Small amount of free peritoneal fluid. 4. Interval significant decrease in  size of previously demonstrated pulmonary metastases. Electronically  Signed   By: Claudie Revering M.D.   On: 04/18/2017 16:44   Dg Chest 2 View  Result Date: 04/17/2017 CLINICAL DATA:  Hypotension and weakness for several days EXAM: CHEST  2 VIEW COMPARISON:  12/25/2008 FINDINGS: Cardiac shadow is within normal limits. The lungs are well aerated bilaterally. Small nodular density is noted in the right upper lobe similar to that seen on prior PET-CT. Postsurgical changes are noted. No acute bony abnormality is seen. IMPRESSION: Stable right upper lobe nodule. No acute abnormality noted. Electronically Signed   By: Inez Catalina M.D.   On: 04/17/2017 10:07   US Renal  Result Date: 04/17/2017 CLINICAL DATA:  Acute renal failure EXAM: RENAL / URINARY TRACT ULTRASOUND COMPLETE COMPARISON:  12/27/2016 FINDINGS: Right Kidney: Length: 12.7. Echogenicity within normal limits. No mass or hydronephrosis visualized. Left Kidney: Surgically removed Bladder: Appears normal for degree of bladder distention. IMPRESSION: Status post left nephrectomy. No acute abnormality noted. Electronically Signed   By: Inez Catalina M.D.   On: 04/17/2017 14:05    Lab Data:  CBC: Recent Labs  Lab 04/17/17 0921 04/17/17 0948 04/18/17 0423 04/19/17 0510  WBC 8.0  --  5.1 5.0  NEUTROABS 5.7  --   --   --   HGB 11.4* 11.2* 9.1* 9.1*  HCT 34.6* 33.0* 28.0* 28.0*  MCV 86.3  --  85.9 86.4  PLT 161  --  107* 245*   Basic Metabolic Panel: Recent Labs  Lab 04/17/17 0921 04/17/17 0948 04/18/17 0423 04/19/17 0510  NA 137 138 139 140  K 5.4* 5.4* 4.6 4.8  CL 105 105 113* 112*  CO2 19*  --  19* 19*  GLUCOSE 133* 127* 78 88  BUN 42* 40* 37* 28*  CREATININE 3.25* 3.10* 2.96* 2.11*  CALCIUM 9.0  --  7.8* 8.3*   GFR: Estimated Creatinine Clearance: 33.1 mL/min (A) (by C-G formula based on SCr of 2.11 mg/dL (H)). Liver Function Tests: Recent Labs  Lab 04/17/17 0921 04/18/17 0423  AST 40 24  ALT 43 29  ALKPHOS 112 87  BILITOT 1.1 0.7  PROT 6.1* 4.7*  ALBUMIN 3.3* 2.5*   No results  for input(s): LIPASE, AMYLASE in the last 168 hours. No results for input(s): AMMONIA in the last 168 hours. Coagulation Profile: No results for input(s): INR, PROTIME in the last 168 hours. Cardiac Enzymes: Recent Labs  Lab 04/17/17 1150  CKTOTAL 81   BNP (last 3 results) No results for input(s): PROBNP in the last 8760 hours. HbA1C: No results for input(s): HGBA1C in the last 72 hours. CBG: Recent Labs  Lab 04/18/17 1211 04/18/17 1630 04/18/17 2149 04/19/17 0749 04/19/17 1219  GLUCAP 91 110* 130* 102* 130*   Lipid Profile: No results for input(s): CHOL, HDL, LDLCALC, TRIG, CHOLHDL, LDLDIRECT in the last 72 hours. Thyroid Function Tests: No results for input(s): TSH, T4TOTAL, FREET4, T3FREE, THYROIDAB in the last 72 hours. Anemia Panel: No results for input(s): VITAMINB12, FOLATE, FERRITIN, TIBC, IRON, RETICCTPCT in the last 72 hours. Urine analysis:    Component Value Date/Time   COLORURINE YELLOW 04/17/2017 [place] 04/17/2017 1525   LABSPEC 1.012 04/17/2017 1525   PHURINE 5.0 04/17/2017 1525   GLUCOSEU NEGATIVE 04/17/2017 1525   HGBUR NEGATIVE 04/17/2017 [place] 04/17/2017 1525   KETONESUR NEGATIVE 04/17/2017 1525   PROTEINUR NEGATIVE 04/17/2017 1525   UROBILINOGEN 0.2 07/04/2012 1852   NITRITE NEGATIVE 04/17/2017 1525  LEUKOCYTESUR NEGATIVE 04/17/2017 1525     Dhani Dannemiller M.D. [facility] 04/19/2017, 1:01 PM  Pager: [phone] Between 7am to 7pm - call Pager - [phone]  After 7pm go to [email or web address] - password [email or web address]  Call night coverage person covering after 7pm

## 2017-04-20 DIAGNOSIS — K529 Noninfective gastroenteritis and colitis, unspecified: Principal | ICD-10-CM

## 2017-04-20 LAB — BASIC METABOLIC PANEL
ANION GAP: 7 (ref 5–15)
BUN: 21 mg/dL — ABNORMAL HIGH (ref 6–20)
CALCIUM: 8.4 mg/dL — AB (ref 8.9–10.3)
CO2: 20 mmol/L — ABNORMAL LOW (ref 22–32)
Chloride: 113 mmol/L — ABNORMAL HIGH (ref 101–111)
Creatinine, Ser: 1.64 mg/dL — ABNORMAL HIGH (ref 0.61–1.24)
GFR calc Af Amer: 47 mL/min — ABNORMAL LOW (ref >60)
GFR, EST NON AFRICAN AMERICAN: 41 mL/min — AB (ref >60)
Glucose, Bld: 101 mg/dL — ABNORMAL HIGH (ref 65–99)
Potassium: 5.2 mmol/L — ABNORMAL HIGH (ref 3.5–5.1)
Sodium: 140 mmol/L (ref 135–145)

## 2017-04-20 LAB — CBC
HCT: 26.3 % — ABNORMAL LOW (ref 39.0–52.0)
Hemoglobin: 8.6 g/dL — ABNORMAL LOW (ref 13.0–17.0)
MCH: 28.6 pg (ref 26.0–34.0)
MCHC: 32.7 g/dL (ref 30.0–36.0)
MCV: 87.4 fL (ref 78.0–100.0)
PLATELETS: 103 10*3/uL — AB (ref 150–400)
RBC: 3.01 MIL/uL — ABNORMAL LOW (ref 4.22–5.81)
RDW: 22.2 % — AB (ref 11.5–15.5)
WBC: 4.5 10*3/uL (ref 4.0–10.5)

## 2017-04-20 LAB — GLUCOSE, CAPILLARY
GLUCOSE-CAPILLARY: 85 mg/dL (ref 65–99)
Glucose-Capillary: 94 mg/dL (ref 65–99)

## 2017-04-20 MED ORDER — METFORMIN HCL 1000 MG PO TABS: 1000.0000 mg | ORAL_TABLET | Freq: Two times a day (BID) | ORAL | 0 refills | Status: DC

## 2017-04-20 MED ORDER — CIPROFLOXACIN HCL 500 MG PO TABS: 500.0000 mg | ORAL_TABLET | Freq: Two times a day (BID) | ORAL | 0 refills | Status: DC

## 2017-04-20 MED ORDER — METRONIDAZOLE 500 MG PO TABS: 500.0000 mg | ORAL_TABLET | Freq: Three times a day (TID) | ORAL | Status: DC

## 2017-04-20 MED ORDER — PROCHLORPERAZINE MALEATE 10 MG PO TABS: 10.0000 mg | ORAL_TABLET | Freq: Four times a day (QID) | ORAL | 0 refills | Status: DC | PRN

## 2017-04-20 MED ORDER — METRONIDAZOLE 500 MG PO TABS
500.0000 mg | ORAL_TABLET | Freq: Three times a day (TID) | ORAL | Status: DC
  Filled 2017-04-20: qty 1

## 2017-04-20 MED ORDER — ONDANSETRON 4 MG PO TBDP: 4.0000 mg | ORAL_TABLET | Freq: Three times a day (TID) | ORAL | 0 refills | Status: DC | PRN

## 2017-04-20 MED ORDER — METRONIDAZOLE 500 MG PO TABS: 500.0000 mg | ORAL_TABLET | Freq: Three times a day (TID) | ORAL | 0 refills | Status: DC

## 2017-04-20 MED ORDER — CIPROFLOXACIN HCL 500 MG PO TABS: 500.0000 mg | ORAL_TABLET | Freq: Two times a day (BID) | ORAL | Status: DC

## 2017-04-20 MED ORDER — CIPROFLOXACIN HCL 500 MG PO TABS
500.0000 mg | ORAL_TABLET | Freq: Two times a day (BID) | ORAL | Status: DC
  Filled 2017-04-20: qty 1

## 2017-04-20 NOTE — Discharge Summary (Signed)
Physician Discharge Summary   Patient ID: Drexel Ivey MRN: [id number] DOB/AGE: [birth year] 72 y.o.  Admit date: 04/17/2017 Discharge date: 04/20/2017  Primary Care Physician:  [name], MD  Discharge Diagnoses:   . Colitis . Acute kidney injury ([place]) . Renal cell cancer, left ([place]) . Severe diarrhea . 3-vessel coronary artery disease . Chronic combined systolic and diastolic heart failure (HCC) Moderate malnutrition   Consults: None  Recommendations for Outpatient Follow-up:  1. Please repeat CBC/BMET at next visit 2. Patient instructed to hold off on chemotherapy   DIET: Diet    Allergies:  No Known Allergies   DISCHARGE MEDICATIONS: Allergies as of 04/20/2017   No Known Allergies     Medication List    STOP taking these medications   cabozantinib S-Malate 40 MG Tabs   lisinopril 5 MG tablet Commonly known as:  PRINIVIL,ZESTRIL   senna-docusate 8.6-50 MG tablet Commonly known as:  Senokot-S     TAKE these medications   aspirin EC 81 MG tablet Take 81 mg by mouth daily.   atorvastatin 40 MG tablet Commonly known as:  LIPITOR Take 1 tablet (40 mg total) by mouth daily.   carvedilol 3.125 MG tablet Commonly known as:  COREG Take 1 tablet (3.125 mg total) by mouth 2 (two) times daily.   ciprofloxacin 500 MG tablet Commonly known as:  CIPRO Take 1 tablet (500 mg total) by mouth 2 (two) times daily. X 10 days   ferrous sulfate 325 (65 FE) MG tablet Take 325 mg by mouth daily with breakfast.   HYDROcodone-acetaminophen 5-325 MG tablet Commonly known as:  NORCO Take 1-2 tablets by mouth every 6 (six) hours as needed for moderate pain or severe pain.   loperamide 2 MG capsule Commonly known as:  IMODIUM Take 4 mg by mouth as needed for diarrhea or loose stools.   metFORMIN 1000 MG tablet Commonly known as:  GLUCOPHAGE Take 1 tablet (1,000 mg total) by mouth 2 (two) times daily. Start taking on:  04/21/2017   metroNIDAZOLE 500 MG tablet Commonly  known as:  FLAGYL Take 1 tablet (500 mg total) by mouth 3 (three) times daily. X 10 days   ondansetron 4 MG disintegrating tablet Commonly known as:  ZOFRAN ODT Take 1 tablet (4 mg total) by mouth every 8 (eight) hours as needed for nausea or vomiting.   pantoprazole 40 MG tablet Commonly known as:  PROTONIX Take 1 tablet (40 mg total) by mouth at bedtime.   prochlorperazine 10 MG tablet Commonly known as:  COMPAZINE Take 1 tablet (10 mg total) by mouth every 6 (six) hours as needed for nausea or vomiting.        Brief H and P: For complete details please refer to admission H and P, but in brief Patient is a 72 year old male with history of CVA, PVD, CAD, ischemic cardiomyopathy, chronic combined systolic and diastolic CHF, diabetes.  He was diagnosed with left renal cell carcinoma in October 2018, subsequently underwent radical nephrectomy on 02/21/17. He has been followed by Dr. Julien Nordmann with oncology and has been treated with Carbometyxoral chemotherapy. He was started on chemotherapy 3 weeks ago and for the first 2 weeks had no significant symptoms. Last week he developed significant high volume watery brown diarrhea multiple stools daily associated with anorexia dry heaves and generalized weakness. He followed up at the oncology office on 1/23 and was given a liter of fluids and a prescription for Compazine. He was also instructed to begin using Imodium as needed  and was instructed to stop his Carbometyxas well as his blood pressure medications.  He presented to cardiology office for regular follow-up and was found to be significantly hypotensive with BP of 66/50 and was sent to ED. He was started on oral Flagyl by ED while awaiting C. difficile panel  Hospital Course:  Acute kidney injury [facility]): Likely due to profound diarrhea and dehydration -Creatinine 3.25 at the time of admission with lactate of 4.1 -Patient was placed on IV fluid hydration, creatinine has improved to 1.6 at  the time of discharge.     Nausea, vomiting, diarrhea: Secondary to acute colitis and possibly side effect of chemotherapy - Patient was seen in oncology clinic on 1/23 by Dr. Earlie Server, recommended to stop treatment with Cabometyx, patient was advised to not begin his treatment again prior to following with oncology. He was recommended Compazine, Imodium. -Oral vancomycin discontinued on 1/30 as C. difficile was negative. -Discussed with Dr. Julien Nordmann, patient's oncologist on 1/30, recommended to stop cabometyx (this is usually side effect of the chemotherapy), hydration, symptomatic treatment and agreed with current management including CT abdomen. No need for oncological evaluation at this time per Dr Earlie Server.  -CT abdomen pelvis showed diffuse colitis involving the right colon, sigmoid colon with changes of enteritis involving the duodenum and proximal jejunum could be inflammatory or infectious in nature, interval 2.9 into 1.6 cm fluid collection in the left lateral subcutaneous fat, may be due to sterile postop fluid collection from robotic access. -Patient was placed on IV ciprofloxacin and Flagyl with improvement in his symptoms.   -Tolerating solid diet, transition to oral antibiotics for 10 days.     Renal cell cancer, left (HCC) status post nephrectomy -Discussed as #2 with Dr. Julien Nordmann, recommended close follow-up after patient is discharged from the hospital  CAD with 3-vessel coronary artery disease -Currently no chest pain, recent cardiac catheterization was August 2018, treatment optimized -Has history of severe PAD, on medical management  Combined systolic and diastolic CHF -2D echo 7/78 showed EF of 45-50% with grade 2 diastolic dysfunction -Patient had profound dehydration at the time of admission, creatinine function improving  History of CVA -Patient was on baby aspirin and statin PTA, restarted at discharge.      Diabetes mellitus type 2 in nonobese (HCC) -Placed  on carb modified diet -Creatinine function not significantly improved, may restart metformin on 2/2  Moderate protein calorie malnutrition in the context of chronic illness, malignancy and colitis -Nutrition consulted     Day of Discharge BP 130/80 (BP Location: Right Arm)   Pulse 80   Temp 97.7 F (36.5 C) (Oral)   Resp 20   Ht 5\' 11"  (1.803 m)   Wt 73.6 kg (162 lb 4.8 oz)   SpO2 100%   BMI 22.64 kg/m   Physical Exam: General: Alert and awake oriented x3 not in any acute distress. HEENT: anicteric sclera, pupils reactive to light and accommodation CVS: S1-S2 clear no murmur rubs or gallops Chest: clear to auscultation bilaterally, no wheezing rales or rhonchi Abdomen: soft nontender, nondistended, normal bowel sounds Extremities: no cyanosis, clubbing or edema noted bilaterally Neuro: Cranial nerves II-XII intact, no focal neurological deficits   The results of significant diagnostics from this hospitalization (including imaging, microbiology, ancillary and laboratory) are listed below for reference.    LAB RESULTS: Basic Metabolic Panel: Recent Labs  Lab 04/19/17 0510 04/20/17 0420  NA 140 140  K 4.8 5.2*  CL 112* 113*  CO2 19* 20*  GLUCOSE 88  101*  BUN 28* 21*  CREATININE 2.11* 1.64*  CALCIUM 8.3* 8.4*   Liver Function Tests: Recent Labs  Lab 04/17/17 0921 04/18/17 0423  AST 40 24  ALT 43 29  ALKPHOS 112 87  BILITOT 1.1 0.7  PROT 6.1* 4.7*  ALBUMIN 3.3* 2.5*   No results for input(s): LIPASE, AMYLASE in the last 168 hours. No results for input(s): AMMONIA in the last 168 hours. CBC: Recent Labs  Lab 04/17/17 0921  04/19/17 0510 04/20/17 0420  WBC 8.0   < > 5.0 4.5  NEUTROABS 5.7  --   --   --   HGB 11.4*   < > 9.1* 8.6*  HCT 34.6*   < > 28.0* 26.3*  MCV 86.3   < > 86.4 87.4  PLT 161   < > 116* 103*   < > = values in this interval not displayed.   Cardiac Enzymes: Recent Labs  Lab 04/17/17 1150  CKTOTAL 81   BNP: Invalid input(s):  POCBNP CBG: Recent Labs  Lab 04/20/17 0753 04/20/17 1157  GLUCAP 94 85    Significant Diagnostic Studies:  Ct Abdomen Pelvis Wo Contrast  Result Date: 04/18/2017 CLINICAL DATA:  Nausea, vomiting, diarrhea and generalized weakness for the past week. Status post left nephrectomy on 02/21/2017. EXAM: CT ABDOMEN AND PELVIS WITHOUT CONTRAST TECHNIQUE: Multidetector CT imaging of the abdomen and pelvis was performed following the standard protocol without IV contrast. COMPARISON:  Abdomen and pelvis CT dated 11/12/2016, abdomen MR dated 11/16/2016 and PET-CT dated 12/27/2016. FINDINGS: Lower chest: The previously demonstrated multiple nodules at the right lung base are significantly smaller. The largest previously measured 10 mm on 12/27/2016, currently 4 mm on image number 8 of series 4. Hepatobiliary: No focal liver abnormality is seen. No gallstones, gallbladder wall thickening, or biliary dilatation. Pancreas: Unremarkable. No pancreatic ductal dilatation or surrounding inflammatory changes. Spleen: Normal in size without focal abnormality. Adrenals/Urinary Tract: Normal appearing right adrenal gland, kidney and ureter. Normal appearing bladder. Surgically absent left kidney. Small amount of fluid in the left nephrectomy bed and adjacent to the spleen without clear visualization of the adrenal gland. Stomach/Bowel: Interval mild diffuse wall thickening involving the right colon and sigmoid colon with mild pericolonic soft tissue stranding. Normal appearing appendix. There is also interval concentric wall thickening involving the 2nd, 3rd and 4th portions of the duodenum as well as multiple proximal jejunal loops. Vascular/Lymphatic: Atheromatous arterial calcifications without aneurysm. No enlarged lymph nodes. Reproductive: Moderately enlarged prostate gland. Other: There is an interval oval fluid collection in the left lower abdominal/upper pelvic subcutaneous fat laterally, measuring 2.9 x 1.6 cm on  image number 53 of series 3, with adjacent subcutaneous soft tissue stranding. No abdominal wall hernia or abnormality. Small amount of free peritoneal fluid. Musculoskeletal: Lumbar and lower thoracic spine degenerative changes and scoliosis. IMPRESSION: 1. Interval changes of colitis involving the right colon and sigmoid colon with changes of enteritis involving the duodenum and proximal jejunum. This could be inflammatory or infectious in nature. Embolic ischemic colitis and enteritis is less likely but not excluded. 2. Interval 2.9 x 1.6 cm fluid collection in the left lateral subcutaneous fat with adjacent soft tissue stranding. This may represent a sterile postoperative fluid collection from robotic access. Infected fluid cannot be excluded. 3. Small amount of free peritoneal fluid. 4. Interval significant decrease in size of previously demonstrated pulmonary metastases. Electronically Signed   By: Claudie Revering M.D.   On: 04/18/2017 16:44   Dg Chest 2 View  Result Date: 04/17/2017 CLINICAL DATA:  Hypotension and weakness for several days EXAM: CHEST  2 VIEW COMPARISON:  12/25/2008 FINDINGS: Cardiac shadow is within normal limits. The lungs are well aerated bilaterally. Small nodular density is noted in the right upper lobe similar to that seen on prior PET-CT. Postsurgical changes are noted. No acute bony abnormality is seen. IMPRESSION: Stable right upper lobe nodule. No acute abnormality noted. Electronically Signed   By: Inez Catalina M.D.   On: 04/17/2017 10:07   US Renal  Result Date: 04/17/2017 CLINICAL DATA:  Acute renal failure EXAM: RENAL / URINARY TRACT ULTRASOUND COMPLETE COMPARISON:  12/27/2016 FINDINGS: Right Kidney: Length: 12.7. Echogenicity within normal limits. No mass or hydronephrosis visualized. Left Kidney: Surgically removed Bladder: Appears normal for degree of bladder distention. IMPRESSION: Status post left nephrectomy. No acute abnormality noted. Electronically Signed   By:  Inez Catalina M.D.   On: 04/17/2017 14:05    2D ECHO:   Disposition and Follow-up: Discharge Instructions    Diet Carb Modified   Complete by:  As directed    Increase activity slowly   Complete by:  As directed        DISPOSITION: Home   DISCHARGE FOLLOW-UP Follow-up Information    Via, [name], MD. Schedule an appointment as soon as possible for a visit in 2 week(s).   Specialty:  Family Medicine Contact information: [place] [address] [phone]        Curt Bears, MD. Schedule an appointment as soon as possible for a visit in 2 day(s).   Specialty:  Oncology Contact information: [place] [address] [phone]            Time spent on Discharge: 40mins  Signed:   Estill Cotta M.D. [facility] 04/20/2017, 12:16 PM Pager: [phone]

## 2017-04-20 NOTE — Progress Notes (Signed)
Discharge instructions reviewed with pt. Pt has no questions at this time. Pt denies any pain and stated he is ready for discharge. Prescriptions given to pt and IV d.c.

## 2017-04-22 LAB — CULTURE, BLOOD (ROUTINE X 2)
Culture: NO GROWTH
Culture: NO GROWTH
SPECIAL REQUESTS: ADEQUATE
Special Requests: ADEQUATE

## 2017-05-01 ENCOUNTER — Ambulatory Visit: Payer: Medicare Other | Admitting: Cardiovascular Disease

## 2017-05-01 ENCOUNTER — Telehealth: Payer: Self-pay

## 2017-05-01 NOTE — Telephone Encounter (Signed)
Nutrition  Patient identified on Malnutrition Screening report for weight loss and poor appetite.    Chart reviewed and patient with renal cell carcinoma, started on oral chemotherapy.  Noted diarrhea, anorexia, nausea and hospital admission.  Oral chemotherapy stopped.    Spoke with patient via phone this pm and introduced self and service.  Patient reports appetite is better and diarrhea has resolved.  Thinks he has gained 5 pounds back.  Reports that he is drinking ensure shakes and "grazing" through the day.  Offered nutrition appointment but declined at this time.  Will contact RD if needed.  Samaira Holzworth B. Zenia Resides, [place], [place] Registered Dietitian [phone] (pager)

## 2017-05-08 ENCOUNTER — Ambulatory Visit: Payer: Medicare Other | Admitting: Cardiovascular Disease

## 2017-05-08 ENCOUNTER — Encounter: Payer: Self-pay | Admitting: Cardiovascular Disease

## 2017-05-08 VITALS — BP 115/64 | HR 100 | Ht 71.0 in | Wt 154.2 lb

## 2017-05-08 DIAGNOSIS — I779 Disorder of arteries and arterioles, unspecified: Secondary | ICD-10-CM

## 2017-05-08 DIAGNOSIS — I25810 Atherosclerosis of coronary artery bypass graft(s) without angina pectoris: Secondary | ICD-10-CM

## 2017-05-08 DIAGNOSIS — E785 Hyperlipidemia, unspecified: Secondary | ICD-10-CM

## 2017-05-08 DIAGNOSIS — I255 Ischemic cardiomyopathy: Secondary | ICD-10-CM | POA: Diagnosis not present

## 2017-05-08 DIAGNOSIS — I739 Peripheral vascular disease, unspecified: Secondary | ICD-10-CM

## 2017-05-08 NOTE — Patient Instructions (Addendum)
Medication Instructions: Your physician recommends that you continue on your current medications as directed. Please refer to the Current Medication list given to you today.  If you need a refill on your cardiac medications before your next appointment, please call your pharmacy.   Follow-Up: Your physician wants you to follow-up in 3 months with Dr. Arida.   Thank you for choosing [facility]!!      

## 2017-05-08 NOTE — Progress Notes (Signed)
Cardiology Office Note   Date:  05/08/2017   ID:  Trevor [name], DOB 13-Jan-1946, MRN [id number]  PCP:  [name], MD  Cardiologist:   [name], MD   No chief complaint on file.     History of Present Illness: Trevor [name] is a 72 y.o. male who presents for a followup visit regarding coronary artery disease status post  CABG,  ischemic cardiomyopathy and peripheral arterial disease. He presented in April of 2014 with anterior ST elevation myocardial infarction and was found to have significant three-vessel coronary artery disease and moderate left main stenosis. The patient underwent CABG at that time.  Echocardiogram in July of 2014 showed improved LV systolic function with an ejection fraction of 45-50% with apical hypokinesis. He underwent a nuclear stress test in December 2016 due to worsening exertional dyspnea. The test showed fixed inferior wall defect without significant ischemia. Ejection fraction was 53%. Overall it was a low risk study.  He is known to have peripheral arterial disease with moderately reduced ABI on the right due to severe SFA disease. He is being treated medically due to lack of claudication.  He was hospitalized in August, 2018 with severe symptomatic anemia with hemoglobin of 5.6. He was also found to have mildly elevated troponin consistent with non-ST elevation myocardial infarction. The patient was transfused. He underwent cardiac catheterization which showed occluded SVG to RCA and SVG to OM. His LIMA to LAD was patent. Ejection fraction was 20-25%. No revascularization could be done with PCI given his symptomatic anemia and also the fact that he was found to have renal mass highly suggestive of cancer with pulmonary nodules. The patient was treated medically. He returned 2 days later with a stroke which was confirmed by MRI.  He was treated medically for coronary artery disease.  He had a repeat echocardiogram done in September which showed an EF of  45-50% with mild mitral regurgitation.  Carotid Doppler showed moderate bilateral disease worse on the right side.  He underwent left nephrectomy in December.  This was followed by chemotherapy for this was complicated by stage IV renal cell carcinoma with pulmonary nodules.  This was complicated by severe diarrhea and nausea.  During his last visit, he was noted to be severely hypotensive and was transferred to the emergency room where he was admitted for hypotension with severe volume depletion and acute on chronic renal failure.  He improved with hydration.  Carvedilol was resumed.  Lisinopril has been on hold.  He has been doing well overall with no chest pain or significant dyspnea.  His diarrhea resolved after he was treated with Flagyl and overall he has been feeling better.     Past Medical History:  Diagnosis Date  . 3-vessel coronary artery disease   . Anemia    2 iron infusions  . Arthritis   . Carotid disease, bilateral (HCC)    moderate  . Chronic systolic heart failure (HCC)    EF 45%  . Coronary artery disease    06/2012:STEMI s/p CABG; 8/18 NSTEMI  . Diabetes mellitus without complication ([place])   . Diarrhea   . Dyspnea    mild  . History of blood transfusion   . Hyperlipidemia   . Ischemic cardiomyopathy    Ejection fraction of 35-40% initially, 45 - 50% on echo 2014, 53% by perfusion study 2016  . Mild mitral regurgitation   . Numbness    Right side since stroke  . OA (osteoarthritis)   .  PAD (peripheral artery disease) ([place])   . Pulmonary nodule   . Renal cell cancer, left ([place]) 12/2016   Associated w/ pulmonary nodules  . Stroke ([place]) 2018  . TIA (transient ischemic attack) 10/2016  . Weakness generalized   . Wears glasses     Past Surgical History:  Procedure Laterality Date  . CARDIOVASCULAR STRESS TEST    . CORONARY ARTERY BYPASS GRAFT N/A 07/05/2012   Procedure: CORONARY ARTERY BYPASS GRAFTING (CABG);  Surgeon: Ivin Poot, MD;  Location: [facility];   Service: Open Heart Surgery;  Laterality: N/A;  . INTRAOPERATIVE TRANSESOPHAGEAL ECHOCARDIOGRAM N/A 07/05/2012   Procedure: INTRAOPERATIVE TRANSESOPHAGEAL ECHOCARDIOGRAM;  Surgeon: Ivin Poot, MD;  Location: [facility];  Service: Open Heart Surgery;  Laterality: N/A;  . LEFT HEART CATH AND CORS/GRAFTS ANGIOGRAPHY N/A 11/14/2016   Procedure: LEFT HEART CATH AND CORS/GRAFTS ANGIOGRAPHY;  Surgeon: Belva Crome, MD;  Location: [facility];  Service: Cardiovascular;  Laterality: N/A;  . LEFT HEART CATHETERIZATION WITH CORONARY ANGIOGRAM N/A 07/04/2012   Procedure: LEFT HEART CATHETERIZATION WITH CORONARY ANGIOGRAM;  Surgeon: Wellington Hampshire, MD;  Location: [facility];  Service: Cardiovascular;  Laterality: N/A;  . RENAL BIOPSY    . ROBOT ASSISTED LAPAROSCOPIC NEPHRECTOMY Left 02/21/2017   Procedure: XI ROBOTIC ASSISTED LAPAROSCOPIC NEPHRECTOMY;  Surgeon: Alexis Frock, MD;  Location: [facility];  Service: Urology;  Laterality: Left;     Current Outpatient Medications  Medication Sig Dispense Refill  . aspirin EC 81 MG tablet Take 81 mg by mouth daily.    Marland Kitchen atorvastatin (LIPITOR) 40 MG tablet Take 1 tablet (40 mg total) by mouth daily. 30 tablet 6  . carvedilol (COREG) 3.125 MG tablet Take 1 tablet (3.125 mg total) by mouth 2 (two) times daily. 180 tablet 2  . ferrous sulfate 325 (65 FE) MG tablet Take 325 mg by mouth daily with breakfast.    . HYDROcodone-acetaminophen (NORCO) 5-325 MG tablet Take 1-2 tablets by mouth every 6 (six) hours as needed for moderate pain or severe pain. 30 tablet 0  . loperamide (IMODIUM) 2 MG capsule Take 4 mg by mouth as needed for diarrhea or loose stools.    . metFORMIN (GLUCOPHAGE) 1000 MG tablet Take 1 tablet (1,000 mg total) by mouth 2 (two) times daily.  0  . ondansetron (ZOFRAN ODT) 4 MG disintegrating tablet Take 1 tablet (4 mg total) by mouth every 8 (eight) hours as needed for nausea or vomiting. 20 tablet 0  . pantoprazole (PROTONIX) 40 MG tablet Take 1  tablet (40 mg total) by mouth at bedtime. 30 tablet 0  . prochlorperazine (COMPAZINE) 10 MG tablet Take 1 tablet (10 mg total) by mouth every 6 (six) hours as needed for nausea or vomiting. 30 tablet 0   No current facility-administered medications for this visit.     Allergies:   Patient has no known allergies.    Social History:  The patient  reports that he quit smoking about 5 years ago. He has a 50.00 pack-year smoking history. he has never used smokeless tobacco. He reports that he does not drink alcohol or use drugs.   Family History:  The patient's family history includes Cancer in his maternal grandfather and mother; Heart attack (age of onset: 56) in his brother; Stroke in his mother.    ROS:  Please see the history of present illness.   Otherwise, review of systems are positive for none.   All other systems are reviewed and negative.  PHYSICAL EXAM: VS:  BP 115/64   Pulse 100   Ht 5\' 11"  (1.803 m)   Wt 154 lb 3.2 oz (69.9 kg)   BMI 21.51 kg/m  , BMI Body mass index is 21.51 kg/m. GEN: Well nourished, well developed, in no acute distress  HEENT: normal  Neck: no JVD, or masses. Bilateral carotid bruits Cardiac: RRR; no murmurs, rubs, or gallops,no edema  Respiratory:  clear to auscultation bilaterally, normal work of breathing GI: soft, nontender, nondistended, + BS MS: no deformity or atrophy  Skin: warm and dry, no rash Neuro:  Strength and sensation are intact Psych: euthymic mood, full affect   EKG:  EKG is not ordered today.    Recent Labs: 11/22/2016: Magnesium 1.7 04/18/2017: ALT 29 04/20/2017: BUN 21; Creatinine, Ser 1.64; Hemoglobin 8.6; Platelets 103; Potassium 5.2; Sodium 140    Lipid Panel    Component Value Date/Time   CHOL 77 11/20/2016 0704   TRIG 64 11/20/2016 0704   HDL 30 (L) 11/20/2016 0704   CHOLHDL 2.6 11/20/2016 0704   VLDL 13 11/20/2016 0704   LDLCALC 34 11/20/2016 0704      Wt Readings from Last 3 Encounters:  05/08/17 154 lb  3.2 oz (69.9 kg)  04/20/17 162 lb 4.8 oz (73.6 kg)  04/17/17 152 lb 3.2 oz (69 kg)         ASSESSMENT AND PLAN:   1.  Coronary artery disease: Status post CABG. Status post non-ST elevation myocardial infarction in August of 2017 in the setting of severe symptomatic anemia.  No significant angina at this time.  I recommend continuing medical therapy.  Further revascularization options can be discussed after treatment of his cancer.  2. Ischemic cardiomyopathy: Most recent ejection fraction was 45-50%.  Continue small dose carvedilol.  Continue to hold lisinopril due to blood pressure being on the low side and recent hyperkalemia and acute kidney injury.  3. Peripheral arterial disease: The patient has evidence of severe right mid SFA stenosis .  The patient has minimal claudication at this time.  5. Hyperlipidemia: Continue treatment with atorvastatin with a target LDL of less than 70.  6. Moderate bilateral carotid disease.Continue medical therapy and repeat carotid Doppler in 1 year.   Disposition: Follow-up with me in 3 months.  Signed,  [name], MD  05/08/2017 8:17 AM    [place]

## 2017-05-09 ENCOUNTER — Encounter: Payer: Self-pay | Admitting: Internal Medicine

## 2017-05-09 ENCOUNTER — Inpatient Hospital Stay: Payer: Medicare Other | Attending: Oncology

## 2017-05-09 ENCOUNTER — Inpatient Hospital Stay (HOSPITAL_BASED_OUTPATIENT_CLINIC_OR_DEPARTMENT_OTHER): Payer: Medicare Other | Admitting: Internal Medicine

## 2017-05-09 ENCOUNTER — Telehealth: Payer: Self-pay | Admitting: Internal Medicine

## 2017-05-09 VITALS — BP 96/61 | HR 99 | Temp 98.1°F | Resp 16 | Wt 156.1 lb

## 2017-05-09 DIAGNOSIS — I255 Ischemic cardiomyopathy: Secondary | ICD-10-CM | POA: Insufficient documentation

## 2017-05-09 DIAGNOSIS — Z5111 Encounter for antineoplastic chemotherapy: Secondary | ICD-10-CM

## 2017-05-09 DIAGNOSIS — C642 Malignant neoplasm of left kidney, except renal pelvis: Secondary | ICD-10-CM | POA: Diagnosis not present

## 2017-05-09 DIAGNOSIS — R197 Diarrhea, unspecified: Secondary | ICD-10-CM | POA: Insufficient documentation

## 2017-05-09 DIAGNOSIS — E785 Hyperlipidemia, unspecified: Secondary | ICD-10-CM | POA: Diagnosis not present

## 2017-05-09 DIAGNOSIS — M1990 Unspecified osteoarthritis, unspecified site: Secondary | ICD-10-CM | POA: Insufficient documentation

## 2017-05-09 DIAGNOSIS — E119 Type 2 diabetes mellitus without complications: Secondary | ICD-10-CM | POA: Insufficient documentation

## 2017-05-09 DIAGNOSIS — I340 Nonrheumatic mitral (valve) insufficiency: Secondary | ICD-10-CM

## 2017-05-09 DIAGNOSIS — Z7984 Long term (current) use of oral hypoglycemic drugs: Secondary | ICD-10-CM | POA: Diagnosis not present

## 2017-05-09 DIAGNOSIS — I252 Old myocardial infarction: Secondary | ICD-10-CM | POA: Diagnosis not present

## 2017-05-09 DIAGNOSIS — Z8673 Personal history of transient ischemic attack (TIA), and cerebral infarction without residual deficits: Secondary | ICD-10-CM | POA: Diagnosis not present

## 2017-05-09 DIAGNOSIS — Z79899 Other long term (current) drug therapy: Secondary | ICD-10-CM

## 2017-05-09 DIAGNOSIS — I739 Peripheral vascular disease, unspecified: Secondary | ICD-10-CM

## 2017-05-09 DIAGNOSIS — I5022 Chronic systolic (congestive) heart failure: Secondary | ICD-10-CM

## 2017-05-09 DIAGNOSIS — I2510 Atherosclerotic heart disease of native coronary artery without angina pectoris: Secondary | ICD-10-CM | POA: Diagnosis not present

## 2017-05-09 DIAGNOSIS — D649 Anemia, unspecified: Secondary | ICD-10-CM

## 2017-05-09 DIAGNOSIS — M419 Scoliosis, unspecified: Secondary | ICD-10-CM

## 2017-05-09 DIAGNOSIS — R918 Other nonspecific abnormal finding of lung field: Secondary | ICD-10-CM | POA: Diagnosis not present

## 2017-05-09 DIAGNOSIS — R5383 Other fatigue: Secondary | ICD-10-CM | POA: Insufficient documentation

## 2017-05-09 DIAGNOSIS — E440 Moderate protein-calorie malnutrition: Secondary | ICD-10-CM

## 2017-05-09 DIAGNOSIS — M129 Arthropathy, unspecified: Secondary | ICD-10-CM | POA: Diagnosis not present

## 2017-05-09 DIAGNOSIS — Z7982 Long term (current) use of aspirin: Secondary | ICD-10-CM

## 2017-05-09 LAB — CBC WITH DIFFERENTIAL (CANCER CENTER ONLY)
Basophils Absolute: 0 10*3/uL (ref 0.0–0.1)
Basophils Relative: 1 %
Eosinophils Absolute: 1.2 10*3/uL — ABNORMAL HIGH (ref 0.0–0.5)
Eosinophils Relative: 18 %
HEMATOCRIT: 35.5 % — AB (ref 38.4–49.9)
Hemoglobin: 11.5 g/dL — ABNORMAL LOW (ref 13.0–17.1)
LYMPHS PCT: 15 %
Lymphs Abs: 1 10*3/uL (ref 0.9–3.3)
MCH: 29 pg (ref 27.2–33.4)
MCHC: 32.5 g/dL (ref 32.0–36.0)
MCV: 89.4 fL (ref 79.3–98.0)
MONO ABS: 0.6 10*3/uL (ref 0.1–0.9)
MONOS PCT: 8 %
NEUTROS ABS: 4 10*3/uL (ref 1.5–6.5)
Neutrophils Relative %: 58 %
Platelet Count: 200 10*3/uL (ref 140–400)
RBC: 3.97 MIL/uL — ABNORMAL LOW (ref 4.20–5.82)
RDW: 23.1 % — AB (ref 11.0–14.6)
WBC Count: 6.9 10*3/uL (ref 4.0–10.3)

## 2017-05-09 LAB — CMP (CANCER CENTER ONLY)
ALT: 14 U/L (ref 0–55)
ANION GAP: 12 — AB (ref 3–11)
AST: 20 U/L (ref 5–34)
Albumin: 3.8 g/dL (ref 3.5–5.0)
Alkaline Phosphatase: 70 U/L (ref 40–150)
BILIRUBIN TOTAL: 0.5 mg/dL (ref 0.2–1.2)
BUN: 21 mg/dL (ref 7–26)
CO2: 24 mmol/L (ref 22–29)
Calcium: 10.2 mg/dL (ref 8.4–10.4)
Chloride: 104 mmol/L (ref 98–109)
Creatinine: 1.21 mg/dL (ref 0.70–1.30)
GFR, EST NON AFRICAN AMERICAN: 59 mL/min — AB (ref >60)
GFR, Est AFR Am: >60 mL/min (ref >60)
Glucose, Bld: 125 mg/dL (ref 70–140)
POTASSIUM: 5.9 mmol/L — AB (ref 3.5–5.1)
Sodium: 140 mmol/L (ref 136–145)
TOTAL PROTEIN: 6.9 g/dL (ref 6.4–8.3)

## 2017-05-09 MED ORDER — CABOZANTINIB S-MALATE 40 MG PO TABS: 40.0000 mg | ORAL_TABLET | Freq: Every morning | ORAL | 2 refills | Status: DC

## 2017-05-09 MED FILL — CABOMETYX 40 MG TABLET: 40 | 30 days supply | Qty: 30 | Fill #0

## 2017-05-09 NOTE — Telephone Encounter (Signed)
Appointments scheduled per 2/20 los.  AVS/Calendar printed and given to patient.

## 2017-05-09 NOTE — Progress Notes (Signed)
[facility] [place] [phone]) [phone]   Fax:([phone]  OFFICE PROGRESS NOTE  Via, [name], MD [place] [address]  DIAGNOSIS: [name].The patient presented with an11 cm left kidney mass and pulmonary nodules.  PRIOR THERAPY:Status post left nephrectomy on 02/21/2017.  CURRENT THERAPY: Cabometyx 60 mg daily. Started 01/21/2017.Status post 1 month of treatment. Treatment was then placed on hold for nephrectomy. Resuming treatment on 03/22/2017.  Status post 3 weeks of treatment.  The patient was advised to hold Cabometyx starting on 04/11/2017.  This dose will be changed to 40 mg p.o. daily and expected to start in the next few days.  INTERVAL HISTORY: Trevor [name] 72 y.o. male returns to the clinic today for follow-up visit.  The patient is feeling better today.  He was recently admitted to [facility] with significant dehydration and hypotension secondary to several episodes of diarrhea.  He is feeling better today.  He denied having any chest pain, shortness of breath, cough or hemoptysis.  He denied having any nausea, vomiting, diarrhea or constipation.  He has been off treatment for the last few weeks because of his hospitalization.  He had repeat CT scan of the abdomen during his hospitalization which showed improvement of the pulmonary nodules.  He is here today for reevaluation before resuming his treatment.  MEDICAL HISTORY: Past Medical History:  Diagnosis Date  . 3-vessel coronary artery disease   . Anemia    2 iron infusions  . Arthritis   . Carotid disease, bilateral (HCC)    moderate  . Chronic systolic heart failure (HCC)    EF 45%  . Coronary artery disease    06/2012:STEMI s/p CABG; 8/18 NSTEMI  . Diabetes mellitus without complication ([place])   . Diarrhea   . Dyspnea    mild  . History of blood transfusion   . Hyperlipidemia   . Ischemic cardiomyopathy    Ejection fraction of 35-40%  initially, 45 - 50% on echo 2014, 53% by perfusion study 2016  . Mild mitral regurgitation   . Numbness    Right side since stroke  . OA (osteoarthritis)   . PAD (peripheral artery disease) ([place])   . Pulmonary nodule   . Renal cell cancer, left ([place]) 12/2016   Associated w/ pulmonary nodules  . Stroke ([place]) 2018  . TIA (transient ischemic attack) 10/2016  . Weakness generalized   . Wears glasses     ALLERGIES:  has No Known Allergies.  MEDICATIONS:  Current Outpatient Medications  Medication Sig Dispense Refill  . aspirin EC 81 MG tablet Take 81 mg by mouth daily.    Marland Kitchen atorvastatin (LIPITOR) 40 MG tablet Take 1 tablet (40 mg total) by mouth daily. 30 tablet 6  . carvedilol (COREG) 3.125 MG tablet Take 1 tablet (3.125 mg total) by mouth 2 (two) times daily. 180 tablet 2  . ferrous sulfate 325 (65 FE) MG tablet Take 325 mg by mouth daily with breakfast.    . metFORMIN (GLUCOPHAGE) 1000 MG tablet Take 1 tablet (1,000 mg total) by mouth 2 (two) times daily.  0  . pantoprazole (PROTONIX) 40 MG tablet Take 1 tablet (40 mg total) by mouth at bedtime. 30 tablet 0  . HYDROcodone-acetaminophen (NORCO) 5-325 MG tablet Take 1-2 tablets by mouth every 6 (six) hours as needed for moderate pain or severe pain. (Patient not taking: Reported on 05/09/2017) 30 tablet 0  . loperamide (IMODIUM) 2 MG  capsule Take 4 mg by mouth as needed for diarrhea or loose stools.    . ondansetron (ZOFRAN ODT) 4 MG disintegrating tablet Take 1 tablet (4 mg total) by mouth every 8 (eight) hours as needed for nausea or vomiting. (Patient not taking: Reported on 05/09/2017) 20 tablet 0  . prochlorperazine (COMPAZINE) 10 MG tablet Take 1 tablet (10 mg total) by mouth every 6 (six) hours as needed for nausea or vomiting. (Patient not taking: Reported on 05/09/2017) 30 tablet 0   No current facility-administered medications for this visit.     SURGICAL HISTORY:  Past Surgical History:  Procedure Laterality Date  .  CARDIOVASCULAR STRESS TEST    . CORONARY ARTERY BYPASS GRAFT N/A 07/05/2012   Procedure: CORONARY ARTERY BYPASS GRAFTING (CABG);  Surgeon: Ivin Poot, MD;  Location: [facility];  Service: Open Heart Surgery;  Laterality: N/A;  . INTRAOPERATIVE TRANSESOPHAGEAL ECHOCARDIOGRAM N/A 07/05/2012   Procedure: INTRAOPERATIVE TRANSESOPHAGEAL ECHOCARDIOGRAM;  Surgeon: Ivin Poot, MD;  Location: [facility];  Service: Open Heart Surgery;  Laterality: N/A;  . LEFT HEART CATH AND CORS/GRAFTS ANGIOGRAPHY N/A 11/14/2016   Procedure: LEFT HEART CATH AND CORS/GRAFTS ANGIOGRAPHY;  Surgeon: Belva Crome, MD;  Location: [facility];  Service: Cardiovascular;  Laterality: N/A;  . LEFT HEART CATHETERIZATION WITH CORONARY ANGIOGRAM N/A 07/04/2012   Procedure: LEFT HEART CATHETERIZATION WITH CORONARY ANGIOGRAM;  Surgeon: Wellington Hampshire, MD;  Location: [facility];  Service: Cardiovascular;  Laterality: N/A;  . RENAL BIOPSY    . ROBOT ASSISTED LAPAROSCOPIC NEPHRECTOMY Left 02/21/2017   Procedure: XI ROBOTIC ASSISTED LAPAROSCOPIC NEPHRECTOMY;  Surgeon: Alexis Frock, MD;  Location: [facility];  Service: Urology;  Laterality: Left;    REVIEW OF SYSTEMS:  Constitutional: positive for fatigue Eyes: negative Ears, nose, mouth, throat, and face: negative Respiratory: negative Cardiovascular: negative Gastrointestinal: negative Genitourinary:negative Integument/breast: negative Hematologic/lymphatic: negative Musculoskeletal:negative Neurological: negative Behavioral/Psych: negative Endocrine: negative Allergic/Immunologic: negative   PHYSICAL EXAMINATION: General appearance: alert, cooperative, fatigued and no distress Head: Normocephalic, without obvious abnormality, atraumatic Neck: no adenopathy, no JVD, supple, symmetrical, trachea midline and thyroid not enlarged, symmetric, no tenderness/mass/nodules Lymph nodes: Cervical, supraclavicular, and axillary nodes normal. Resp: clear to auscultation  bilaterally Back: symmetric, no curvature. ROM normal. No CVA tenderness. Cardio: regular rate and rhythm, S1, S2 normal, no murmur, click, rub or gallop GI: soft, non-tender; bowel sounds normal; no masses,  no organomegaly Extremities: extremities normal, atraumatic, no cyanosis or edema Neurologic: Alert and oriented X 3, normal strength and tone. Normal symmetric reflexes. Normal coordination and gait  ECOG PERFORMANCE STATUS: 1 - Symptomatic but completely ambulatory  Blood pressure 96/61, pulse 99, temperature 98.1 F (36.7 C), temperature source Oral, resp. rate 16, weight 156 lb 1.6 oz (70.8 kg), SpO2 100 %.  LABORATORY DATA: Lab Results  Component Value Date   WBC 6.9 05/09/2017   HGB 8.6 (L) 04/20/2017   HCT 35.5 (L) 05/09/2017   MCV 89.4 05/09/2017   PLT 200 05/09/2017      Chemistry      Component Value Date/Time   NA 140 04/20/2017 0420   NA 141 03/22/2017 0919   K 5.2 (H) 04/20/2017 0420   K 5.5 (H) 03/22/2017 0919   CL 113 (H) 04/20/2017 0420   CO2 20 (L) 04/20/2017 0420   CO2 27 03/22/2017 0919   BUN 21 (H) 04/20/2017 0420   BUN 18.1 03/22/2017 0919   CREATININE 1.64 (H) 04/20/2017 0420   CREATININE 1.2 03/22/2017 0919  Component Value Date/Time   CALCIUM 8.4 (L) 04/20/2017 0420   CALCIUM 9.8 03/22/2017 0919   ALKPHOS 87 04/18/2017 0423   ALKPHOS 103 03/22/2017 0919   AST 24 04/18/2017 0423   AST 15 03/22/2017 0919   ALT 29 04/18/2017 0423   ALT 13 03/22/2017 0919   BILITOT 0.7 04/18/2017 0423   BILITOT 0.41 03/22/2017 0919       RADIOGRAPHIC STUDIES: Ct Abdomen Pelvis Wo Contrast  Result Date: 04/18/2017 CLINICAL DATA:  Nausea, vomiting, diarrhea and generalized weakness for the past week. Status post left nephrectomy on 02/21/2017. EXAM: CT ABDOMEN AND PELVIS WITHOUT CONTRAST TECHNIQUE: Multidetector CT imaging of the abdomen and pelvis was performed following the standard protocol without IV contrast. COMPARISON:  Abdomen and pelvis CT  dated 11/12/2016, abdomen MR dated 11/16/2016 and PET-CT dated 12/27/2016. FINDINGS: Lower chest: The previously demonstrated multiple nodules at the right lung base are significantly smaller. The largest previously measured 10 mm on 12/27/2016, currently 4 mm on image number 8 of series 4. Hepatobiliary: No focal liver abnormality is seen. No gallstones, gallbladder wall thickening, or biliary dilatation. Pancreas: Unremarkable. No pancreatic ductal dilatation or surrounding inflammatory changes. Spleen: Normal in size without focal abnormality. Adrenals/Urinary Tract: Normal appearing right adrenal gland, kidney and ureter. Normal appearing bladder. Surgically absent left kidney. Small amount of fluid in the left nephrectomy bed and adjacent to the spleen without clear visualization of the adrenal gland. Stomach/Bowel: Interval mild diffuse wall thickening involving the right colon and sigmoid colon with mild pericolonic soft tissue stranding. Normal appearing appendix. There is also interval concentric wall thickening involving the 2nd, 3rd and 4th portions of the duodenum as well as multiple proximal jejunal loops. Vascular/Lymphatic: Atheromatous arterial calcifications without aneurysm. No enlarged lymph nodes. Reproductive: Moderately enlarged prostate gland. Other: There is an interval oval fluid collection in the left lower abdominal/upper pelvic subcutaneous fat laterally, measuring 2.9 x 1.6 cm on image number 53 of series 3, with adjacent subcutaneous soft tissue stranding. No abdominal wall hernia or abnormality. Small amount of free peritoneal fluid. Musculoskeletal: Lumbar and lower thoracic spine degenerative changes and scoliosis. IMPRESSION: 1. Interval changes of colitis involving the right colon and sigmoid colon with changes of enteritis involving the duodenum and proximal jejunum. This could be inflammatory or infectious in nature. Embolic ischemic colitis and enteritis is less likely but not  excluded. 2. Interval 2.9 x 1.6 cm fluid collection in the left lateral subcutaneous fat with adjacent soft tissue stranding. This may represent a sterile postoperative fluid collection from robotic access. Infected fluid cannot be excluded. 3. Small amount of free peritoneal fluid. 4. Interval significant decrease in size of previously demonstrated pulmonary metastases. Electronically Signed   By: Claudie Revering M.D.   On: 04/18/2017 16:44   Dg Chest 2 View  Result Date: 04/17/2017 CLINICAL DATA:  Hypotension and weakness for several days EXAM: CHEST  2 VIEW COMPARISON:  12/25/2008 FINDINGS: Cardiac shadow is within normal limits. The lungs are well aerated bilaterally. Small nodular density is noted in the right upper lobe similar to that seen on prior PET-CT. Postsurgical changes are noted. No acute bony abnormality is seen. IMPRESSION: Stable right upper lobe nodule. No acute abnormality noted. Electronically Signed   By: Inez Catalina M.D.   On: 04/17/2017 10:07   US Renal  Result Date: 04/17/2017 CLINICAL DATA:  Acute renal failure EXAM: RENAL / URINARY TRACT ULTRASOUND COMPLETE COMPARISON:  12/27/2016 FINDINGS: Right Kidney: Length: 12.7. Echogenicity within normal limits. No mass  or hydronephrosis visualized. Left Kidney: Surgically removed Bladder: Appears normal for degree of bladder distention. IMPRESSION: Status post left nephrectomy. No acute abnormality noted. Electronically Signed   By: Inez Catalina M.D.   On: 04/17/2017 14:05    ASSESSMENT AND PLAN: This is a very pleasant 72 years old white male diagnosed with metastatic renal cell carcinoma, chromophobe type presented with large left kidney mass as well as multiple bilateral pulmonary nodules.  He is status post left nephrectomy on February 21, 2017.  The patient is currently on treatment with Cabometyx initially at a dose of 60 mg p.o. daily but he has a rough time tolerating this treatment with several episodes of diarrhea as well as  nausea. His recent CT scan of the abdomen showed improvement in the pulmonary nodules. I recommended for the patient to resume his treatment with Cabometyx but we will start him at a dose of 40 mg p.o. Daily. For the dehydration and hypotension, I strongly encouraged the patient to increase his oral intake. I would see the patient back for follow-up visit in 1 month for reevaluation with repeat CBC and comprehensive metabolic panel. He was advised to call immediately if he has any concerning symptoms in the interval. The patient voices understanding of current disease status and treatment options and is in agreement with the current care plan.  All questions were answered. The patient knows to call the clinic with any problems, questions or concerns. We can certainly see the patient much sooner if necessary.  I spent 15 minutes counseling the patient face to face. The total time spent in the appointment was 25 minutes.  Disclaimer: This note was dictated with voice recognition software. Similar sounding words can inadvertently be transcribed and may not be corrected upon review.

## 2017-06-05 ENCOUNTER — Inpatient Hospital Stay (HOSPITAL_BASED_OUTPATIENT_CLINIC_OR_DEPARTMENT_OTHER): Payer: Medicare Other | Admitting: Internal Medicine

## 2017-06-05 ENCOUNTER — Encounter: Payer: Self-pay | Admitting: Internal Medicine

## 2017-06-05 ENCOUNTER — Inpatient Hospital Stay: Payer: Medicare Other | Attending: Oncology

## 2017-06-05 VITALS — BP 153/83 | HR 100 | Temp 98.2°F | Resp 17 | Ht 71.0 in | Wt 150.2 lb

## 2017-06-05 DIAGNOSIS — E785 Hyperlipidemia, unspecified: Secondary | ICD-10-CM | POA: Insufficient documentation

## 2017-06-05 DIAGNOSIS — I739 Peripheral vascular disease, unspecified: Secondary | ICD-10-CM | POA: Insufficient documentation

## 2017-06-05 DIAGNOSIS — C7800 Secondary malignant neoplasm of unspecified lung: Secondary | ICD-10-CM | POA: Diagnosis not present

## 2017-06-05 DIAGNOSIS — I255 Ischemic cardiomyopathy: Secondary | ICD-10-CM | POA: Insufficient documentation

## 2017-06-05 DIAGNOSIS — I5022 Chronic systolic (congestive) heart failure: Secondary | ICD-10-CM | POA: Insufficient documentation

## 2017-06-05 DIAGNOSIS — I2510 Atherosclerotic heart disease of native coronary artery without angina pectoris: Secondary | ICD-10-CM | POA: Insufficient documentation

## 2017-06-05 DIAGNOSIS — Z7982 Long term (current) use of aspirin: Secondary | ICD-10-CM | POA: Diagnosis not present

## 2017-06-05 DIAGNOSIS — Z905 Acquired absence of kidney: Secondary | ICD-10-CM

## 2017-06-05 DIAGNOSIS — D649 Anemia, unspecified: Secondary | ICD-10-CM | POA: Diagnosis not present

## 2017-06-05 DIAGNOSIS — Z7984 Long term (current) use of oral hypoglycemic drugs: Secondary | ICD-10-CM | POA: Insufficient documentation

## 2017-06-05 DIAGNOSIS — Z8673 Personal history of transient ischemic attack (TIA), and cerebral infarction without residual deficits: Secondary | ICD-10-CM | POA: Diagnosis not present

## 2017-06-05 DIAGNOSIS — E119 Type 2 diabetes mellitus without complications: Secondary | ICD-10-CM | POA: Insufficient documentation

## 2017-06-05 DIAGNOSIS — C642 Malignant neoplasm of left kidney, except renal pelvis: Secondary | ICD-10-CM

## 2017-06-05 DIAGNOSIS — Z79899 Other long term (current) drug therapy: Secondary | ICD-10-CM | POA: Insufficient documentation

## 2017-06-05 DIAGNOSIS — I252 Old myocardial infarction: Secondary | ICD-10-CM | POA: Diagnosis not present

## 2017-06-05 DIAGNOSIS — M129 Arthropathy, unspecified: Secondary | ICD-10-CM | POA: Insufficient documentation

## 2017-06-05 LAB — CBC WITH DIFFERENTIAL (CANCER CENTER ONLY)
BASOS PCT: 0 %
Basophils Absolute: 0 10*3/uL (ref 0.0–0.1)
EOS ABS: 0.5 10*3/uL (ref 0.0–0.5)
Eosinophils Relative: 6 %
HCT: 33.7 % — ABNORMAL LOW (ref 38.4–49.9)
HEMOGLOBIN: 11 g/dL — AB (ref 13.0–17.1)
LYMPHS ABS: 1.2 10*3/uL (ref 0.9–3.3)
Lymphocytes Relative: 14 %
MCH: 29.7 pg (ref 27.2–33.4)
MCHC: 32.6 g/dL (ref 32.0–36.0)
MCV: 91.1 fL (ref 79.3–98.0)
Monocytes Absolute: 0.4 10*3/uL (ref 0.1–0.9)
Monocytes Relative: 5 %
NEUTROS PCT: 75 %
Neutro Abs: 6.5 10*3/uL (ref 1.5–6.5)
Platelet Count: 187 10*3/uL (ref 140–400)
RBC: 3.7 MIL/uL — AB (ref 4.20–5.82)
RDW: 19.2 % — ABNORMAL HIGH (ref 11.0–14.6)
WBC: 8.6 10*3/uL (ref 4.0–10.3)

## 2017-06-05 LAB — CMP (CANCER CENTER ONLY)
ALT: 39 U/L (ref 0–55)
ANION GAP: 9 (ref 3–11)
AST: 35 U/L — ABNORMAL HIGH (ref 5–34)
Albumin: 3.4 g/dL — ABNORMAL LOW (ref 3.5–5.0)
Alkaline Phosphatase: 82 U/L (ref 40–150)
BUN: 19 mg/dL (ref 7–26)
CHLORIDE: 107 mmol/L (ref 98–109)
CO2: 24 mmol/L (ref 22–29)
CREATININE: 1.29 mg/dL (ref 0.70–1.30)
Calcium: 9.2 mg/dL (ref 8.4–10.4)
GFR, EST NON AFRICAN AMERICAN: 55 mL/min — AB (ref >60)
Glucose, Bld: 121 mg/dL (ref 70–140)
Potassium: 4.8 mmol/L (ref 3.5–5.1)
Sodium: 140 mmol/L (ref 136–145)
Total Bilirubin: 0.4 mg/dL (ref 0.2–1.2)
Total Protein: 6.4 g/dL (ref 6.4–8.3)

## 2017-06-05 MED FILL — CABOMETYX 40 MG TABLET: 40 | 30 days supply | Qty: 30 | Fill #1

## 2017-06-05 NOTE — Progress Notes (Signed)
[facility] Telephone:(336) [phone]   [phone]) [phone]  OFFICE PROGRESS NOTE  Via, [name], MD [place] [address]  DIAGNOSIS: [name].The patient presented with an11 cm left kidney mass and pulmonary nodules.  PRIOR THERAPY:Status post left nephrectomy on 02/21/2017.  CURRENT THERAPY: Cabometyx 60 mg daily. Started 01/21/2017.Status post 1 month of treatment. Treatment was then placed on hold for nephrectomy. Resuming treatment on 03/22/2017.  Status post 3 weeks of treatment.  The patient was advised to hold Cabometyx starting on 04/11/2017.  This dose will be changed to 40 mg p.o. daily and expected to start in the next few days.  INTERVAL HISTORY: Trevor [name] 72 y.o. male returns to the clinic today for follow-up visit.  The patient is feeling much better today.  He denied having any chest pain, shortness of breath, cough or hemoptysis.  He has no fever or chills.  He is tolerating the current dose of Cabometyx of 40 mg p.o. daily much better.  He had only 1 or 2 days of diarrhea and he did hold his medication during that time and has improvement quickly.  He also used Imodium at that time.  He is here today for evaluation and repeat blood work.  MEDICAL HISTORY: Past Medical History:  Diagnosis Date  . 3-vessel coronary artery disease   . Anemia    2 iron infusions  . Arthritis   . Carotid disease, bilateral (HCC)    moderate  . Chronic systolic heart failure (HCC)    EF 45%  . Coronary artery disease    06/2012:STEMI s/p CABG; 8/18 NSTEMI  . Diabetes mellitus without complication ([place])   . Diarrhea   . Dyspnea    mild  . History of blood transfusion   . Hyperlipidemia   . Ischemic cardiomyopathy    Ejection fraction of 35-40% initially, 45 - 50% on echo 2014, 53% by perfusion study 2016  . Mild mitral regurgitation   . Numbness    Right side since stroke  . OA (osteoarthritis)   . PAD  (peripheral artery disease) ([place])   . Pulmonary nodule   . Renal cell cancer, left ([place]) 12/2016   Associated w/ pulmonary nodules  . Stroke ([place]) 2018  . TIA (transient ischemic attack) 10/2016  . Weakness generalized   . Wears glasses     ALLERGIES:  has No Known Allergies.  MEDICATIONS:  Current Outpatient Medications  Medication Sig Dispense Refill  . aspirin EC 81 MG tablet Take 81 mg by mouth daily.    Marland Kitchen atorvastatin (LIPITOR) 40 MG tablet Take 1 tablet (40 mg total) by mouth daily. 30 tablet 6  . cabozantinib S-Malate (CABOMETYX) 40 MG TABS Take 40 mg by mouth every morning. 30 tablet 2  . carvedilol (COREG) 3.125 MG tablet Take 1 tablet (3.125 mg total) by mouth 2 (two) times daily. 180 tablet 2  . ferrous sulfate 325 (65 FE) MG tablet Take 325 mg by mouth daily with breakfast.    . HYDROcodone-acetaminophen (NORCO) 5-325 MG tablet Take 1-2 tablets by mouth every 6 (six) hours as needed for moderate pain or severe pain. (Patient not taking: Reported on 05/09/2017) 30 tablet 0  . loperamide (IMODIUM) 2 MG capsule Take 4 mg by mouth as needed for diarrhea or loose stools.    . metFORMIN (GLUCOPHAGE) 1000 MG tablet Take 1 tablet (1,000 mg total) by mouth 2 (two) times daily.  0  .  ondansetron (ZOFRAN ODT) 4 MG disintegrating tablet Take 1 tablet (4 mg total) by mouth every 8 (eight) hours as needed for nausea or vomiting. (Patient not taking: Reported on 05/09/2017) 20 tablet 0  . pantoprazole (PROTONIX) 40 MG tablet Take 1 tablet (40 mg total) by mouth at bedtime. 30 tablet 0  . prochlorperazine (COMPAZINE) 10 MG tablet Take 1 tablet (10 mg total) by mouth every 6 (six) hours as needed for nausea or vomiting. (Patient not taking: Reported on 05/09/2017) 30 tablet 0   No current facility-administered medications for this visit.     SURGICAL HISTORY:  Past Surgical History:  Procedure Laterality Date  . CARDIOVASCULAR STRESS TEST    . CORONARY ARTERY BYPASS GRAFT N/A 07/05/2012    Procedure: CORONARY ARTERY BYPASS GRAFTING (CABG);  Surgeon: Ivin Poot, MD;  Location: [facility];  Service: Open Heart Surgery;  Laterality: N/A;  . INTRAOPERATIVE TRANSESOPHAGEAL ECHOCARDIOGRAM N/A 07/05/2012   Procedure: INTRAOPERATIVE TRANSESOPHAGEAL ECHOCARDIOGRAM;  Surgeon: Ivin Poot, MD;  Location: [facility];  Service: Open Heart Surgery;  Laterality: N/A;  . LEFT HEART CATH AND CORS/GRAFTS ANGIOGRAPHY N/A 11/14/2016   Procedure: LEFT HEART CATH AND CORS/GRAFTS ANGIOGRAPHY;  Surgeon: Belva Crome, MD;  Location: [facility];  Service: Cardiovascular;  Laterality: N/A;  . LEFT HEART CATHETERIZATION WITH CORONARY ANGIOGRAM N/A 07/04/2012   Procedure: LEFT HEART CATHETERIZATION WITH CORONARY ANGIOGRAM;  Surgeon: Wellington Hampshire, MD;  Location: [facility];  Service: Cardiovascular;  Laterality: N/A;  . RENAL BIOPSY    . ROBOT ASSISTED LAPAROSCOPIC NEPHRECTOMY Left 02/21/2017   Procedure: XI ROBOTIC ASSISTED LAPAROSCOPIC NEPHRECTOMY;  Surgeon: Alexis Frock, MD;  [facility];  Service: Urology;  Laterality: Left;    REVIEW OF SYSTEMS:  A comprehensive review of systems was negative except for: Constitutional: positive for fatigue   PHYSICAL EXAMINATION: General appearance: alert, cooperative, fatigued and no distress Head: Normocephalic, without obvious abnormality, atraumatic Neck: no adenopathy, no JVD, supple, symmetrical, trachea midline and thyroid not enlarged, symmetric, no tenderness/mass/nodules Lymph nodes: Cervical, supraclavicular, and axillary nodes normal. Resp: clear to auscultation bilaterally Back: symmetric, no curvature. ROM normal. No CVA tenderness. Cardio: regular rate and rhythm, S1, S2 normal, no murmur, click, rub or gallop GI: soft, non-tender; bowel sounds normal; no masses,  no organomegaly Extremities: extremities normal, atraumatic, no cyanosis or edema  ECOG PERFORMANCE STATUS: 1 - Symptomatic but completely ambulatory  Blood pressure (!)  153/83, pulse 100, temperature 98.2 F (36.8 C), resp. rate 17, height 5\' 11"  (1.803 m), weight 150 lb 3.2 oz (68.1 kg), SpO2 99 %.  LABORATORY DATA: Lab Results  Component Value Date   WBC 8.6 06/05/2017   HGB 8.6 (L) 04/20/2017   HCT 33.7 (L) 06/05/2017   MCV 91.1 06/05/2017   PLT 187 06/05/2017      Chemistry      Component Value Date/Time   NA 140 05/09/2017 0801   NA 141 03/22/2017 0919   K 5.9 (H) 05/09/2017 0801   K 5.5 (H) 03/22/2017 0919   CL 104 05/09/2017 0801   CO2 24 05/09/2017 0801   CO2 27 03/22/2017 0919   BUN 21 05/09/2017 0801   BUN 18.1 03/22/2017 0919   CREATININE 1.21 05/09/2017 0801   CREATININE 1.2 03/22/2017 0919      Component Value Date/Time   CALCIUM 10.2 05/09/2017 0801   CALCIUM 9.8 03/22/2017 0919   ALKPHOS 70 05/09/2017 0801   ALKPHOS 103 03/22/2017 0919   AST 20 05/09/2017 0801  AST 15 03/22/2017 0919   ALT 14 05/09/2017 0801   ALT 13 03/22/2017 0919   BILITOT 0.5 05/09/2017 0801   BILITOT 0.41 03/22/2017 0919       RADIOGRAPHIC STUDIES: No results found.  ASSESSMENT AND PLAN: This is a very pleasant 72 years old white male diagnosed with metastatic renal cell carcinoma, chromophobe type presented with large left kidney mass as well as multiple bilateral pulmonary nodules.  He is status post left nephrectomy on February 21, 2017.  The patient is currently on treatment with Cabometyx initially at a dose of 60 mg p.o. daily but he has a rough time tolerating this treatment with several episodes of diarrhea as well as nausea. His treatment was switched to Cabometyx at a dose of 40 mg p.o. Daily status post 1 months of treatment and has been tolerating this treatment fairly well. I recommended for the patient to continue his treatment with the same dose.  I will see him back for follow-up visit in 1 month for evaluation after repeating blood work and CT scan of the chest, abdomen and pelvis without contrast. The patient was advised to call  immediately if he has any concerning symptoms in the interval. The patient voices understanding of current disease status and treatment options and is in agreement with the current care plan. All questions were answered. The patient knows to call the clinic with any problems, questions or concerns. We can certainly see the patient much sooner if necessary.  I spent 10 minutes counseling the patient face to face. The total time spent in the appointment was 15 minutes.  Disclaimer: This note was dictated with voice recognition software. Similar sounding words can inadvertently be transcribed and may not be corrected upon review.

## 2017-06-06 ENCOUNTER — Telehealth: Payer: Self-pay | Admitting: Internal Medicine

## 2017-06-06 NOTE — Telephone Encounter (Signed)
Scheduled appt per 3/18 los - sent reminder letter in the  Mail - lab/ct and f/u in one month.

## 2017-07-02 ENCOUNTER — Ambulatory Visit (HOSPITAL_COMMUNITY)
Admission: RE | Admit: 2017-07-02 | Discharge: 2017-07-02 | Disposition: A | Discharge status: Home / Self Care | Payer: Medicare Other | Source: Ambulatory Visit | Attending: Internal Medicine | Admitting: Internal Medicine

## 2017-07-02 ENCOUNTER — Inpatient Hospital Stay: Payer: Medicare Other | Attending: Oncology

## 2017-07-02 DIAGNOSIS — I700 Atherosclerosis of aorta: Secondary | ICD-10-CM | POA: Diagnosis not present

## 2017-07-02 DIAGNOSIS — C7800 Secondary malignant neoplasm of unspecified lung: Secondary | ICD-10-CM | POA: Insufficient documentation

## 2017-07-02 DIAGNOSIS — Z905 Acquired absence of kidney: Secondary | ICD-10-CM | POA: Diagnosis not present

## 2017-07-02 DIAGNOSIS — I2510 Atherosclerotic heart disease of native coronary artery without angina pectoris: Secondary | ICD-10-CM | POA: Diagnosis not present

## 2017-07-02 DIAGNOSIS — R634 Abnormal weight loss: Secondary | ICD-10-CM | POA: Diagnosis not present

## 2017-07-02 DIAGNOSIS — Z79899 Other long term (current) drug therapy: Secondary | ICD-10-CM | POA: Diagnosis not present

## 2017-07-02 DIAGNOSIS — E119 Type 2 diabetes mellitus without complications: Secondary | ICD-10-CM | POA: Diagnosis not present

## 2017-07-02 DIAGNOSIS — R5383 Other fatigue: Secondary | ICD-10-CM | POA: Insufficient documentation

## 2017-07-02 DIAGNOSIS — E785 Hyperlipidemia, unspecified: Secondary | ICD-10-CM | POA: Diagnosis not present

## 2017-07-02 DIAGNOSIS — C642 Malignant neoplasm of left kidney, except renal pelvis: Secondary | ICD-10-CM

## 2017-07-02 DIAGNOSIS — R630 Anorexia: Secondary | ICD-10-CM | POA: Diagnosis not present

## 2017-07-02 DIAGNOSIS — I255 Ischemic cardiomyopathy: Secondary | ICD-10-CM | POA: Insufficient documentation

## 2017-07-02 DIAGNOSIS — Z7984 Long term (current) use of oral hypoglycemic drugs: Secondary | ICD-10-CM | POA: Insufficient documentation

## 2017-07-02 DIAGNOSIS — Z951 Presence of aortocoronary bypass graft: Secondary | ICD-10-CM | POA: Diagnosis not present

## 2017-07-02 DIAGNOSIS — R197 Diarrhea, unspecified: Secondary | ICD-10-CM | POA: Diagnosis not present

## 2017-07-02 DIAGNOSIS — Z7982 Long term (current) use of aspirin: Secondary | ICD-10-CM | POA: Insufficient documentation

## 2017-07-02 DIAGNOSIS — Z8673 Personal history of transient ischemic attack (TIA), and cerebral infarction without residual deficits: Secondary | ICD-10-CM | POA: Insufficient documentation

## 2017-07-02 DIAGNOSIS — I739 Peripheral vascular disease, unspecified: Secondary | ICD-10-CM | POA: Diagnosis not present

## 2017-07-02 DIAGNOSIS — N400 Enlarged prostate without lower urinary tract symptoms: Secondary | ICD-10-CM | POA: Insufficient documentation

## 2017-07-02 DIAGNOSIS — I5022 Chronic systolic (congestive) heart failure: Secondary | ICD-10-CM | POA: Diagnosis not present

## 2017-07-02 DIAGNOSIS — I252 Old myocardial infarction: Secondary | ICD-10-CM | POA: Insufficient documentation

## 2017-07-02 LAB — CMP (CANCER CENTER ONLY)
ALBUMIN: 3.6 g/dL (ref 3.5–5.0)
ALK PHOS: 110 U/L (ref 40–150)
ALT: 39 U/L (ref 0–55)
ANION GAP: 11 (ref 3–11)
AST: 29 U/L (ref 5–34)
BILIRUBIN TOTAL: 0.8 mg/dL (ref 0.2–1.2)
BUN: 25 mg/dL (ref 7–26)
CALCIUM: 9.9 mg/dL (ref 8.4–10.4)
CO2: 26 mmol/L (ref 22–29)
Chloride: 103 mmol/L (ref 98–109)
Creatinine: 1.58 mg/dL — ABNORMAL HIGH (ref 0.70–1.30)
GFR, EST NON AFRICAN AMERICAN: 43 mL/min — AB (ref >60)
GFR, Est AFR Am: 49 mL/min — ABNORMAL LOW (ref >60)
GLUCOSE: 96 mg/dL (ref 70–140)
POTASSIUM: 5.4 mmol/L — AB (ref 3.5–5.1)
Sodium: 140 mmol/L (ref 136–145)
TOTAL PROTEIN: 7 g/dL (ref 6.4–8.3)

## 2017-07-02 LAB — LACTATE DEHYDROGENASE: LDH: 231 U/L (ref 125–245)

## 2017-07-02 LAB — CBC WITH DIFFERENTIAL (CANCER CENTER ONLY)
Basophils Absolute: 0 10*3/uL (ref 0.0–0.1)
Basophils Relative: 0 %
Eosinophils Absolute: 0.3 10*3/uL (ref 0.0–0.5)
Eosinophils Relative: 4 %
HCT: 40.6 % (ref 38.4–49.9)
Hemoglobin: 13.6 g/dL (ref 13.0–17.1)
Lymphocytes Relative: 23 %
Lymphs Abs: 1.8 10*3/uL (ref 0.9–3.3)
MCH: 30.8 pg (ref 27.2–33.4)
MCHC: 33.5 g/dL (ref 32.0–36.0)
MCV: 92.1 fL (ref 79.3–98.0)
Monocytes Absolute: 0.4 10*3/uL (ref 0.1–0.9)
Monocytes Relative: 5 %
Neutro Abs: 5.3 10*3/uL (ref 1.5–6.5)
Neutrophils Relative %: 68 %
Platelet Count: 192 10*3/uL (ref 140–400)
RBC: 4.41 MIL/uL (ref 4.20–5.82)
RDW: 17.7 % — ABNORMAL HIGH (ref 11.0–14.6)
WBC Count: 7.8 10*3/uL (ref 4.0–10.3)

## 2017-07-04 ENCOUNTER — Inpatient Hospital Stay (HOSPITAL_BASED_OUTPATIENT_CLINIC_OR_DEPARTMENT_OTHER): Payer: Medicare Other | Admitting: Oncology

## 2017-07-04 ENCOUNTER — Encounter: Payer: Self-pay | Admitting: Oncology

## 2017-07-04 ENCOUNTER — Telehealth: Payer: Self-pay | Admitting: Internal Medicine

## 2017-07-04 VITALS — BP 101/60 | HR 103 | Temp 97.9°F | Resp 18 | Ht 71.0 in | Wt 145.7 lb

## 2017-07-04 DIAGNOSIS — Z7984 Long term (current) use of oral hypoglycemic drugs: Secondary | ICD-10-CM

## 2017-07-04 DIAGNOSIS — R634 Abnormal weight loss: Secondary | ICD-10-CM | POA: Diagnosis not present

## 2017-07-04 DIAGNOSIS — Z7982 Long term (current) use of aspirin: Secondary | ICD-10-CM

## 2017-07-04 DIAGNOSIS — Z8673 Personal history of transient ischemic attack (TIA), and cerebral infarction without residual deficits: Secondary | ICD-10-CM

## 2017-07-04 DIAGNOSIS — C7800 Secondary malignant neoplasm of unspecified lung: Secondary | ICD-10-CM

## 2017-07-04 DIAGNOSIS — Z951 Presence of aortocoronary bypass graft: Secondary | ICD-10-CM

## 2017-07-04 DIAGNOSIS — R197 Diarrhea, unspecified: Secondary | ICD-10-CM | POA: Diagnosis not present

## 2017-07-04 DIAGNOSIS — E785 Hyperlipidemia, unspecified: Secondary | ICD-10-CM | POA: Diagnosis not present

## 2017-07-04 DIAGNOSIS — I2510 Atherosclerotic heart disease of native coronary artery without angina pectoris: Secondary | ICD-10-CM | POA: Diagnosis not present

## 2017-07-04 DIAGNOSIS — E119 Type 2 diabetes mellitus without complications: Secondary | ICD-10-CM | POA: Diagnosis not present

## 2017-07-04 DIAGNOSIS — I255 Ischemic cardiomyopathy: Secondary | ICD-10-CM | POA: Diagnosis not present

## 2017-07-04 DIAGNOSIS — R5383 Other fatigue: Secondary | ICD-10-CM | POA: Diagnosis not present

## 2017-07-04 DIAGNOSIS — I700 Atherosclerosis of aorta: Secondary | ICD-10-CM

## 2017-07-04 DIAGNOSIS — R630 Anorexia: Secondary | ICD-10-CM

## 2017-07-04 DIAGNOSIS — I252 Old myocardial infarction: Secondary | ICD-10-CM

## 2017-07-04 DIAGNOSIS — I5022 Chronic systolic (congestive) heart failure: Secondary | ICD-10-CM

## 2017-07-04 DIAGNOSIS — Z7189 Other specified counseling: Secondary | ICD-10-CM

## 2017-07-04 DIAGNOSIS — C642 Malignant neoplasm of left kidney, except renal pelvis: Secondary | ICD-10-CM | POA: Diagnosis not present

## 2017-07-04 DIAGNOSIS — I739 Peripheral vascular disease, unspecified: Secondary | ICD-10-CM

## 2017-07-04 DIAGNOSIS — Z5111 Encounter for antineoplastic chemotherapy: Secondary | ICD-10-CM

## 2017-07-04 DIAGNOSIS — Z79899 Other long term (current) drug therapy: Secondary | ICD-10-CM

## 2017-07-04 DIAGNOSIS — N400 Enlarged prostate without lower urinary tract symptoms: Secondary | ICD-10-CM

## 2017-07-04 DIAGNOSIS — Z905 Acquired absence of kidney: Secondary | ICD-10-CM

## 2017-07-04 NOTE — Progress Notes (Signed)
[facility] OFFICE PROGRESS NOTE  Via, [name], MD [place] [address]  DIAGNOSIS: [name].The patient presented with an11 cm left kidney mass and pulmonary nodules.  PRIOR THERAPY:  1) Status post left nephrectomy on 02/21/2017. 2)  Cabometyx 60 mg daily. Started 01/21/2017.Status post 1 month of treatment. Treatment was then placed on hold for nephrectomy. Resuming treatment on 03/22/2017.Status post 3 weeks of treatment. The patient was advised to hold Cabometyxstarting on 04/11/2017.  This dose will be changed to 40 mg p.o. Daily.  Started 05/12/2017.  Status post 2 months of treatment.  Patient stopped secondary to intolerance.  CURRENT THERAPY: Observation  INTERVAL HISTORY: Trevor [name] 72 y.o. male returns for routine follow-up visit by himself.  Patient reports he is not feeling well.  He is having a lot more fatigue, decreased appetite, weight loss.  He reports that his quality of life has declined.  He has been having several episodes of diarrhea per day when he takes medication.  He has held his medication on several occasions and does not have a diarrhea on those days.  Diarrhea has not been responsive to Imodium.  He has not taken his medication for about 4 days at this point.  The patient denies fevers and chills.  Denies chest pain, shortness of breath, cough, hemoptysis.  Denies nausea, vomiting, constipation.  The patient had a recent restaging CT scan is here to discuss the results.  MEDICAL HISTORY: Past Medical History:  Diagnosis Date  . 3-vessel coronary artery disease   . Anemia    2 iron infusions  . Arthritis   . Carotid disease, bilateral (HCC)    moderate  . Chronic systolic heart failure (HCC)    EF 45%  . Coronary artery disease    06/2012:STEMI s/p CABG; 8/18 NSTEMI  . Diabetes mellitus without complication ([facility])   . Diarrhea   . Dyspnea    mild  . History of blood transfusion   .  Hyperlipidemia   . Ischemic cardiomyopathy    Ejection fraction of 35-40% initially, 45 - 50% on echo 2014, 53% by perfusion study 2016  . Mild mitral regurgitation   . Numbness    Right side since stroke  . OA (osteoarthritis)   . PAD (peripheral artery disease) ([facility])   . Pulmonary nodule   . Renal cell cancer, left ([facility]) 12/2016   Associated w/ pulmonary nodules  . Stroke ([facility]) 2018  . TIA (transient ischemic attack) 10/2016  . Weakness generalized   . Wears glasses     ALLERGIES:  has No Known Allergies.  MEDICATIONS:  Current Outpatient Medications  Medication Sig Dispense Refill  . aspirin EC 81 MG tablet Take 81 mg by mouth daily.    Marland Kitchen atorvastatin (LIPITOR) 40 MG tablet Take 1 tablet (40 mg total) by mouth daily. 30 tablet 6  . cabozantinib S-Malate (CABOMETYX) 40 MG TABS Take 40 mg by mouth every morning. 30 tablet 2  . carvedilol (COREG) 3.125 MG tablet Take 1 tablet (3.125 mg total) by mouth 2 (two) times daily. 180 tablet 2  . ferrous sulfate 325 (65 FE) MG tablet Take 325 mg by mouth daily with breakfast.    . HYDROcodone-acetaminophen (NORCO) 5-325 MG tablet Take 1-2 tablets by mouth every 6 (six) hours as needed for moderate pain or severe pain. (Patient not taking: Reported on 05/09/2017) 30 tablet 0  . loperamide (IMODIUM) 2 MG capsule Take 4 mg by mouth  as needed for diarrhea or loose stools.    . metFORMIN (GLUCOPHAGE) 1000 MG tablet Take 1 tablet (1,000 mg total) by mouth 2 (two) times daily.  0  . ondansetron (ZOFRAN ODT) 4 MG disintegrating tablet Take 1 tablet (4 mg total) by mouth every 8 (eight) hours as needed for nausea or vomiting. (Patient not taking: Reported on 05/09/2017) 20 tablet 0  . pantoprazole (PROTONIX) 40 MG tablet Take 1 tablet (40 mg total) by mouth at bedtime. 30 tablet 0  . prochlorperazine (COMPAZINE) 10 MG tablet Take 1 tablet (10 mg total) by mouth every 6 (six) hours as needed for nausea or vomiting. (Patient not taking: Reported on  05/09/2017) 30 tablet 0   No current facility-administered medications for this visit.     SURGICAL HISTORY:  Past Surgical History:  Procedure Laterality Date  . CARDIOVASCULAR STRESS TEST    . CORONARY ARTERY BYPASS GRAFT N/A 07/05/2012   Procedure: CORONARY ARTERY BYPASS GRAFTING (CABG);  Surgeon: Ivin Poot, MD;  Location: [facility];  Service: Open Heart Surgery;  Laterality: N/A;  . INTRAOPERATIVE TRANSESOPHAGEAL ECHOCARDIOGRAM N/A 07/05/2012   Procedure: INTRAOPERATIVE TRANSESOPHAGEAL ECHOCARDIOGRAM;  Surgeon: Ivin Poot, MD;  Location: [facility];  Service: Open Heart Surgery;  Laterality: N/A;  . LEFT HEART CATH AND CORS/GRAFTS ANGIOGRAPHY N/A 11/14/2016   Procedure: LEFT HEART CATH AND CORS/GRAFTS ANGIOGRAPHY;  Surgeon: Belva Crome, MD;  Location: [facility];  Service: Cardiovascular;  Laterality: N/A;  . LEFT HEART CATHETERIZATION WITH CORONARY ANGIOGRAM N/A 07/04/2012   Procedure: LEFT HEART CATHETERIZATION WITH CORONARY ANGIOGRAM;  Surgeon: Wellington Hampshire, MD;  Location: [facility];  Service: Cardiovascular;  Laterality: N/A;  . RENAL BIOPSY    . ROBOT ASSISTED LAPAROSCOPIC NEPHRECTOMY Left 02/21/2017   Procedure: XI ROBOTIC ASSISTED LAPAROSCOPIC NEPHRECTOMY;  Surgeon: Alexis Frock, MD;  Location: [facility];  Service: Urology;  Laterality: Left;    REVIEW OF SYSTEMS:   Review of Systems  Constitutional: Negative for chills, fever. Positive for fatigue, decreased appetite, and weight loss. HENT:   Negative for mouth sores, nosebleeds, sore throat and trouble swallowing.   Eyes: Negative for eye problems and icterus.  Respiratory: Negative for cough, hemoptysis, shortness of breath and wheezing.   Cardiovascular: Negative for chest pain and leg swelling.  Gastrointestinal: Negative for abdominal pain, constipation, nausea and vomiting. Positive for diarrhea. Genitourinary: Negative for bladder incontinence, difficulty urinating, dysuria, frequency and hematuria.    Musculoskeletal: Negative for back pain, gait problem, neck pain and neck stiffness.  Skin: Negative for itching and rash.  Neurological: Negative for dizziness, extremity weakness, gait problem, headaches, light-headedness and seizures.  Hematological: Negative for adenopathy. Does not bruise/bleed easily.  Psychiatric/Behavioral: Negative for confusion, depression and sleep disturbance. The patient is not nervous/anxious.     PHYSICAL EXAMINATION:  Blood pressure 101/60, pulse (!) 103, temperature 97.9 F (36.6 C), temperature source Oral, resp. rate 18, height 5\' 11"  (1.803 m), weight 145 lb 11.2 oz (66.1 kg), SpO2 99 %.  ECOG PERFORMANCE STATUS: 1 - Symptomatic but completely ambulatory  Physical Exam  Constitutional: Oriented to person, place, and time and well-developed, well-nourished, and in no distress. No distress.  HENT:  Head: Normocephalic and atraumatic.  Mouth/Throat: Oropharynx is clear and moist. No oropharyngeal exudate.  Eyes: Conjunctivae are normal. Right eye exhibits no discharge. Left eye exhibits no discharge. No scleral icterus.  Neck: Normal range of motion. Neck supple.  Cardiovascular: Normal rate, regular rhythm, normal heart sounds and intact distal  pulses.   Pulmonary/Chest: Effort normal and breath sounds normal. No respiratory distress. No wheezes. No rales.  Abdominal: Soft. Bowel sounds are normal. Exhibits no distension and no mass. There is no tenderness.  Musculoskeletal: Normal range of motion. Exhibits no edema.  Lymphadenopathy:    No cervical adenopathy.  Neurological: Alert and oriented to person, place, and time. Exhibits normal muscle tone. Gait normal. Coordination normal.  Skin: Skin is warm and dry. No rash noted. Not diaphoretic. No erythema. No pallor.  Psychiatric: Mood, memory and judgment normal.  Vitals reviewed.  LABORATORY DATA: Lab Results  Component Value Date   WBC 7.8 07/02/2017   HGB 8.6 (L) 04/20/2017   HCT 40.6  07/02/2017   MCV 92.1 07/02/2017   PLT 192 07/02/2017      Chemistry      Component Value Date/Time   NA 140 07/02/2017 0940   NA 141 03/22/2017 0919   K 5.4 (H) 07/02/2017 0940   K 5.5 (H) 03/22/2017 0919   CL 103 07/02/2017 0940   CO2 26 07/02/2017 0940   CO2 27 03/22/2017 0919   BUN 25 07/02/2017 0940   BUN 18.1 03/22/2017 0919   CREATININE 1.58 (H) 07/02/2017 0940   CREATININE 1.2 03/22/2017 0919      Component Value Date/Time   CALCIUM 9.9 07/02/2017 0940   CALCIUM 9.8 03/22/2017 0919   ALKPHOS 110 07/02/2017 0940   ALKPHOS 103 03/22/2017 0919   AST 29 07/02/2017 0940   AST 15 03/22/2017 0919   ALT 39 07/02/2017 0940   ALT 13 03/22/2017 0919   BILITOT 0.8 07/02/2017 0940   BILITOT 0.41 03/22/2017 0919       RADIOGRAPHIC STUDIES:  Ct Abdomen Pelvis Wo Contrast  Result Date: 07/02/2017 CLINICAL DATA:  Stage IV left renal cell carcinoma diagnosed October 2018 with pulmonary metastases. Ongoing oral chemotherapy. Restaging. EXAM: CT CHEST, ABDOMEN AND PELVIS WITHOUT CONTRAST TECHNIQUE: Multidetector CT imaging of the chest, abdomen and pelvis was performed following the standard protocol without IV contrast. COMPARISON:  12/27/2016 PET-CT. 11/12/2016 CT chest, abdomen and pelvis. FINDINGS: CT CHEST FINDINGS Cardiovascular: Normal heart size. No significant pericardial fluid/thickening. Left main and 3 vessel coronary atherosclerosis status post CABG. Atherosclerotic nonaneurysmal thoracic aorta. Normal caliber pulmonary arteries. Mediastinum/Nodes: No discrete thyroid nodules. Unremarkable esophagus. No pathologically enlarged axillary, mediastinal or gross hilar lymph nodes, noting limited sensitivity for the detection of hilar adenopathy on this noncontrast study. Lungs/Pleura: No pneumothorax. No pleural effusion. No acute consolidative airspace disease or lung masses. Several (at least 10) solid pulmonary nodules scattered throughout both lungs are all decreased in size  since 12/27/2016 PET-CT. Right upper lobe solid 1.1 cm pulmonary nodule (series 5/image 44), previously 1.3 cm using similar measurement technique, mildly decreased. Anterior right upper lobe 1.1 cm nodule (series 5/image 58), previously 1.3 cm, mildly decreased. Right middle lobe 1.1 cm nodule along the minor fissure (series 5/image 67), decreased from 1.5 cm. Medial right lower lobe 1.1 cm subpleural nodule (series 5/image 70), decreased from 1.3 cm. Right lower lobe 0.6 cm nodule (series 5/image 97), decreased from 1.1 cm. Posterior left upper lobe 0.6 cm nodule (series 5/image 49), decreased from 1.0 cm. No new or enlarging pulmonary nodules. Musculoskeletal: No aggressive appearing focal osseous lesions. Intact sternotomy wires. Mild thoracic spondylosis. CT ABDOMEN PELVIS FINDINGS Hepatobiliary: Normal liver with no liver mass. Normal gallbladder with no radiopaque cholelithiasis. No biliary ductal dilatation. Pancreas: Normal, with no mass or duct dilation. Spleen: Normal size. No mass. Adrenals/Urinary Tract: No  discrete adrenal nodules. Interval left nephrectomy. No discrete mass or fluid collection in the left nephrectomy bed. No right renal stones. No right hydronephrosis. No contour deforming right renal mass. Normal bladder. Stomach/Bowel: Normal non-distended stomach. Normal caliber small bowel with no small bowel wall thickening. Normal appendix. Normal large bowel with no diverticulosis, large bowel wall thickening or pericolonic fat stranding. Vascular/Lymphatic: Atherosclerotic nonaneurysmal abdominal aorta. No pathologically enlarged lymph nodes in the abdomen or pelvis. Reproductive: Mildly to moderately enlarged prostate, unchanged. Other: No pneumoperitoneum, ascites or focal fluid collection. Musculoskeletal: No aggressive appearing focal osseous lesions. Marked lumbar spondylosis. IMPRESSION: 1. Interval left nephrectomy. No unenhanced CT findings of residual/recurrent disease in the left  nephrectomy bed. 2. Pulmonary metastases are all decreased in size. No new or progressive metastatic disease. 3. Chronic findings include: Aortic Atherosclerosis (ICD10-I70.0). Coronary atherosclerosis. Mild-to-moderate prostatomegaly. Electronically Signed   By: Ilona Sorrel M.D.   On: 07/02/2017 12:43   Ct Chest Wo Contrast  Result Date: 07/02/2017 CLINICAL DATA:  Stage IV left renal cell carcinoma diagnosed October 2018 with pulmonary metastases. Ongoing oral chemotherapy. Restaging. EXAM: CT CHEST, ABDOMEN AND PELVIS WITHOUT CONTRAST TECHNIQUE: Multidetector CT imaging of the chest, abdomen and pelvis was performed following the standard protocol without IV contrast. COMPARISON:  12/27/2016 PET-CT. 11/12/2016 CT chest, abdomen and pelvis. FINDINGS: CT CHEST FINDINGS Cardiovascular: Normal heart size. No significant pericardial fluid/thickening. Left main and 3 vessel coronary atherosclerosis status post CABG. Atherosclerotic nonaneurysmal thoracic aorta. Normal caliber pulmonary arteries. Mediastinum/Nodes: No discrete thyroid nodules. Unremarkable esophagus. No pathologically enlarged axillary, mediastinal or gross hilar lymph nodes, noting limited sensitivity for the detection of hilar adenopathy on this noncontrast study. Lungs/Pleura: No pneumothorax. No pleural effusion. No acute consolidative airspace disease or lung masses. Several (at least 10) solid pulmonary nodules scattered throughout both lungs are all decreased in size since 12/27/2016 PET-CT. Right upper lobe solid 1.1 cm pulmonary nodule (series 5/image 44), previously 1.3 cm using similar measurement technique, mildly decreased. Anterior right upper lobe 1.1 cm nodule (series 5/image 58), previously 1.3 cm, mildly decreased. Right middle lobe 1.1 cm nodule along the minor fissure (series 5/image 67), decreased from 1.5 cm. Medial right lower lobe 1.1 cm subpleural nodule (series 5/image 70), decreased from 1.3 cm. Right lower lobe 0.6 cm  nodule (series 5/image 97), decreased from 1.1 cm. Posterior left upper lobe 0.6 cm nodule (series 5/image 49), decreased from 1.0 cm. No new or enlarging pulmonary nodules. Musculoskeletal: No aggressive appearing focal osseous lesions. Intact sternotomy wires. Mild thoracic spondylosis. CT ABDOMEN PELVIS FINDINGS Hepatobiliary: Normal liver with no liver mass. Normal gallbladder with no radiopaque cholelithiasis. No biliary ductal dilatation. Pancreas: Normal, with no mass or duct dilation. Spleen: Normal size. No mass. Adrenals/Urinary Tract: No discrete adrenal nodules. Interval left nephrectomy. No discrete mass or fluid collection in the left nephrectomy bed. No right renal stones. No right hydronephrosis. No contour deforming right renal mass. Normal bladder. Stomach/Bowel: Normal non-distended stomach. Normal caliber small bowel with no small bowel wall thickening. Normal appendix. Normal large bowel with no diverticulosis, large bowel wall thickening or pericolonic fat stranding. Vascular/Lymphatic: Atherosclerotic nonaneurysmal abdominal aorta. No pathologically enlarged lymph nodes in the abdomen or pelvis. Reproductive: Mildly to moderately enlarged prostate, unchanged. Other: No pneumoperitoneum, ascites or focal fluid collection. Musculoskeletal: No aggressive appearing focal osseous lesions. Marked lumbar spondylosis. IMPRESSION: 1. Interval left nephrectomy. No unenhanced CT findings of residual/recurrent disease in the left nephrectomy bed. 2. Pulmonary metastases are all decreased in size. No new or progressive  metastatic disease. 3. Chronic findings include: Aortic Atherosclerosis (ICD10-I70.0). Coronary atherosclerosis. Mild-to-moderate prostatomegaly. Electronically Signed   By: Ilona Sorrel M.D.   On: 07/02/2017 12:43     ASSESSMENT/PLAN:  Renal cell carcinoma [facility]) This is a very pleasant 72 year old white male diagnosed with metastatic renal cell carcinoma, chromophobe type presented  with large left kidney mass as well as multiple bilateral pulmonary nodules.  He is status post left nephrectomy on February 21, 2017. The patient received treatment with Cabometyx initially at a dose of 60 mg p.o. daily but he has a rough time tolerating this treatment with several episodes of diarrhea as well as nausea. His treatment was switched to Cabometyx at a dose of 40 mg p.o. Daily status post 2 months. He is having increased fatigue, decreased appetite, weight loss and diarrhea. He had a restaging CT scan is here to discuss the results.  Patient was seen with Dr. Julien Nordmann.  CT scan results discussed with the patient which showed response to therapy.  The patient is having poor tolerance of his treatment.  We discussed stopping kinematics and considering treatment with immunotherapy. Adverse effects of  therapy were discussed including but not limited to immune mediated the skin rash, diarrhea, inflammation of the lung, kidney, liver, thyroid or other endocrine dysfunction. The patient is concerned about his quality of life is not sure that he wants to pursue any additional treatment at this time.  He asked about his prognosis and we again reviewed with him that his disease is not curable. At this time, the patient will discontinue Cabometyx.  He was given information about immunotherapy and will think about it.  We will see him back in 1 month for evaluation and repeat lab work and further discussion of his treatment options.  The patient was advised to call immediately if he has any concerning symptoms in the interval. The patient voices understanding of current disease status and treatment options and is in agreement with the current care plan. All questions were answered. The patient knows to call the clinic with any problems, questions or concerns. We can certainly see the patient much sooner if necessary.   Orders Placed This Encounter  Procedures  . CBC with Differential ([facility] Center  Only)    Standing Status:   Future    Standing Expiration Date:   07/05/2018  . CMP ([facility] only)    Standing Status:   Future    Standing Expiration Date:   07/05/2018   Mikey Bussing, DNP, AGPCNP-BC, AOCNP 07/06/17  ADDENDUM: Hematology/Oncology Attending: I had a face-to-face encounter with the patient.  I recommended his care plan.  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  The patient was a started on treatment with Cabometyx initially at 60 mg p.o. daily discontinued secondary to intolerance.  He resumed his treatment again but at the lower dose of 40 mg.  The patient continues to complain increasing fatigue and weakness and has poor quality of life was this treatment.  Recent imaging studies including CT scan of the chest, abdomen and pelvis showed improvement of his disease. I had a lengthy discussion with the patient today about his condition and treatment options.  I discussed with him his prognosis in details.  The patient understands that he has incurable condition and all  the treatment will be of palliative nature.  He is thinking more about quality than quantity of life. I offered the patient second line treatment with immunotherapy was Nivolumab which I think will be much better tolerated than his current treatment with Cabometyx.  He would like to hold on any treatment for now.  We will arrange for him to come back for follow-up visit in 1 month for reevaluation and more discussion of his treatment options.  He was also given the option of palliative care and hospice if he is not interested in any additional treatment. The patient was advised to call immediately if he has any concerning symptoms in the interval.  Disclaimer: This note was dictated with voice recognition  software. Similar sounding words can inadvertently be transcribed and may be missed upon review. Eilleen Kempf, MD 07/06/17

## 2017-07-04 NOTE — Patient Instructions (Signed)
Nivolumab injection What is this medicine? NIVOLUMAB (nye VOL ue mab) is a monoclonal antibody. It is used to treat melanoma, lung cancer, kidney cancer, head and neck cancer, Hodgkin lymphoma, urothelial cancer, colon cancer, and liver cancer. This medicine may be used for other purposes; ask your health care provider or pharmacist if you have questions. COMMON BRAND NAME(S): Opdivo What should I tell my health care provider before I take this medicine? They need to know if you have any of these conditions: -diabetes -immune system problems -kidney disease -liver disease -lung disease -organ transplant -stomach or intestine problems -thyroid disease -an unusual or allergic reaction to nivolumab, other medicines, foods, dyes, or preservatives -pregnant or trying to get pregnant -breast-feeding How should I use this medicine? This medicine is for infusion into a vein. It is given by a health care professional in a hospital or clinic setting. A special MedGuide will be given to you before each treatment. Be sure to read this information carefully each time. Talk to your pediatrician regarding the use of this medicine in children. While this drug may be prescribed for children as young as 12 years for selected conditions, precautions do apply. Overdosage: If you think you have taken too much of this medicine contact a poison control center or emergency room at once. NOTE: This medicine is only for you. Do not share this medicine with others. What if I miss a dose? It is important not to miss your dose. Call your doctor or health care professional if you are unable to keep an appointment. What may interact with this medicine? Interactions have not been studied. Give your health care provider a list of all the medicines, herbs, non-prescription drugs, or dietary supplements you use. Also tell them if you smoke, drink alcohol, or use illegal drugs. Some items may interact with your  medicine. This list may not describe all possible interactions. Give your health care provider a list of all the medicines, herbs, non-prescription drugs, or dietary supplements you use. Also tell them if you smoke, drink alcohol, or use illegal drugs. Some items may interact with your medicine. What should I watch for while using this medicine? This drug may make you feel generally unwell. Continue your course of treatment even though you feel ill unless your doctor tells you to stop. You may need blood work done while you are taking this medicine. Do not become pregnant while taking this medicine or for 5 months after stopping it. Women should inform their doctor if they wish to become pregnant or think they might be pregnant. There is a potential for serious side effects to an unborn child. Talk to your health care professional or pharmacist for more information. Do not breast-feed an infant while taking this medicine. What side effects may I notice from receiving this medicine? Side effects that you should report to your doctor or health care professional as soon as possible: -allergic reactions like skin rash, itching or hives, swelling of the face, lips, or tongue -black, tarry stools -blood in the urine -bloody or watery diarrhea -changes in vision -change in sex drive -changes in emotions or moods -chest pain -confusion -cough -decreased appetite -diarrhea -facial flushing -feeling faint or lightheaded -fever, chills -hair loss -hallucination, loss of contact with reality -headache -irritable -joint pain -loss of memory -muscle pain -muscle weakness -seizures -shortness of breath -signs and symptoms of high blood sugar such as dizziness; dry mouth; dry skin; fruity breath; nausea; stomach pain; increased hunger or thirst; increased   urination -signs and symptoms of kidney injury like trouble passing urine or change in the amount of urine -signs and symptoms of liver injury  like dark yellow or brown urine; general ill feeling or flu-like symptoms; light-colored stools; loss of appetite; nausea; right upper belly pain; unusually weak or tired; yellowing of the eyes or skin -stiff neck -swelling of the ankles, feet, hands -weight gain Side effects that usually do not require medical attention (report to your doctor or health care professional if they continue or are bothersome): -bone pain -constipation -tiredness -vomiting This list may not describe all possible side effects. Call your doctor for medical advice about side effects. You may report side effects to FDA at 1-800-FDA-1088. Where should I keep my medicine? This drug is given in a hospital or clinic and will not be stored at home. NOTE: This sheet is a summary. It may not cover all possible information. If you have questions about this medicine, talk to your doctor, pharmacist, or health care provider.  2018 Elsevier/Gold Standard (2015-12-13 17:49:34)  

## 2017-07-04 NOTE — Telephone Encounter (Signed)
Appointments scheduled AVS/Calendar printed per 4/17 los °

## 2017-07-06 NOTE — Assessment & Plan Note (Signed)
This is a very pleasant 72 year old white male diagnosed with metastatic renal cell carcinoma, chromophobe type presented with large left kidney mass as well as multiple bilateral pulmonary nodules.  He is status post left nephrectomy on February 21, 2017. The patient received treatment with Cabometyx initially at a dose of 60 mg p.o. daily but he has a rough time tolerating this treatment with several episodes of diarrhea as well as nausea. His treatment was switched to Cabometyx at a dose of 40 mg p.o. Daily status post 2 months. He is having increased fatigue, decreased appetite, weight loss and diarrhea. He had a restaging CT scan is here to discuss the results.  Patient was seen with Dr. Julien Nordmann.  CT scan results discussed with the patient which showed response to therapy.  The patient is having poor tolerance of his treatment.  We discussed stopping kinematics and considering treatment with immunotherapy. Adverse effects of therapy were discussed including but not limited to immune mediated the skin rash, diarrhea, inflammation of the lung, kidney, liver, thyroid or other endocrine dysfunction. The patient is concerned about his quality of life is not sure that he wants to pursue any additional treatment at this time.  He asked about his prognosis and we again reviewed with him that his disease is not curable. At this time, the patient will discontinue Cabometyx.  He was given information about immunotherapy and will think about it.  We will see him back in 1 month for evaluation and repeat lab work and further discussion of his treatment options.  The patient was advised to call immediately if he has any concerning symptoms in the interval. The patient voices understanding of current disease status and treatment options and is in agreement with the current care plan. All questions were answered. The patient knows to call the clinic with any problems, questions or concerns. We can certainly see the  patient much sooner if necessary.

## 2017-07-09 NOTE — Progress Notes (Signed)
[facility]  PATIENT: Trevor [name] DOB: Dec 25, 1945   REASON FOR VISIT: Follow-up for stroke  September 2018 HISTORY FROM: Patient    HISTORY OF PRESENT ILLNESS:UPDATE 4/23/2019CM Trevor [name], 72 year old male returns for follow-up with history of stroke in September 2018.  He is currently on aspirin 0.81 mg daily without further stroke or TIA symptoms.  He does have minor bruising and no bleeding.  He remains on Lipitor without myalgias.  Blood pressure in the office today 118/74.  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 He was unable to tolerate  recent chemotherapy due to significant diarhea.   He had a left kidney removed for cancer in December.  He continues to have some mild right-sided weakness.  He denies any falls or balance issues.  He returns for reevaluation  01/09/17 PSMr Hosea is a 89 year Caucasian male seen today for the first office follow-up visit following hospital admission for stroke in September 2018. History is up 10 from the patient, review of electronic medical records and  I personally reviewed imaging films.Trevor [name] an 72 y.o.malerecently discharged from hospital with diagnosis of renal cell carcinoma with lung and kidney mets, acute on chronic heart failure with low EF, DM II, HLD, CAD s/p CABG, 70% R ICA stenosis, who presents as a stroke alert for sudden onset R side weakness. LSN was 11 pm on 11/20/2016, and patient realized he could not move right arm or leg. Patient also had difficulty answering some questions and speech was slow. BP was 149 systolic on arrrival. Glucose was 174. Date last known well: 9.3.2018.Time last known well: 11 pm.NIHSS 7.Modified Rankin:0.MRI scan of the brain showed subacute to early acute subcentimeter infarcts involving left thalamus, left periventricular occipital lobe. MRI with contrast was ordered but patient refused it due to claustrophobia. CT angiogram of the head  and neck showed left P3 segment occlusion and 70% proximal right ICA and 50% left proximal ICA stenosis.50% stenosis at the left vertebral artery origin as well. Transthoracic echo showed reduced ejection fraction of 45-50% but no clot. LDL cholesterol was study from grams percent and hemoglobin A1c was 5.9. Patient was previously on aspirin 81 mg and this was changed to 325 mg  He states his done well since discharge. He still has some mild right-sided weakness with diminished fine motor skills in his handwriting is not good. He has no therapy needs. He did undergo PET scan as an outpatient which confirmed hypermetabolic nodules in both lungs and left superomedial renal lesion. He underwent lung biopsy yesterday and results are yet pending. He states is starting aspirin well without bruising or bleeding. Blood pressure is well controlled and today it is 108/661. Is tolerating Lipitor without muscle aches and pains. His sugars are all under good control. He has no new neurological complaints today.  REVIEW OF SYSTEMS: Full 14 system review of systems performed and notable only for those listed, all others are neg:  Constitutional: neg  Cardiovascular: neg Ear/Nose/Throat: neg  Skin: neg Eyes: neg Respiratory: Shortness of breath Gastroitestinal: neg  Hematology/Lymphatic: neg  Endocrine: neg Musculoskeletal: Joint paing Allergy/Immunology: neg Neurological: neg Psychiatric: neg Sleep : neg   ALLERGIES: No Known Allergies  HOME MEDICATIONS: Outpatient Medications Prior to Visit  Medication Sig Dispense Refill  . aspirin EC 81 MG tablet Take 81 mg by mouth daily.    Marland Kitchen atorvastatin (LIPITOR) 40 MG tablet Take 1 tablet (40 mg total) by mouth daily. [place]  tablet 6  . carvedilol (COREG) 3.125 MG tablet Take 1 tablet (3.125 mg total) by mouth 2 (two) times daily. 180 tablet 2  . metFORMIN (GLUCOPHAGE) 1000 MG tablet Take 1 tablet (1,000 mg total) by mouth 2 (two) times daily.  0  . pantoprazole  (PROTONIX) 40 MG tablet Take 1 tablet (40 mg total) by mouth at bedtime. 30 tablet 0  . cabozantinib S-Malate (CABOMETYX) 40 MG TABS Take 40 mg by mouth every morning. (Patient not taking: Reported on 07/10/2017) 30 tablet 2  . HYDROcodone-acetaminophen (NORCO) 5-325 MG tablet Take 1-2 tablets by mouth every 6 (six) hours as needed for moderate pain or severe pain. (Patient not taking: Reported on 05/09/2017) 30 tablet 0  . loperamide (IMODIUM) 2 MG capsule Take 4 mg by mouth as needed for diarrhea or loose stools.    . ondansetron (ZOFRAN ODT) 4 MG disintegrating tablet Take 1 tablet (4 mg total) by mouth every 8 (eight) hours as needed for nausea or vomiting. (Patient not taking: Reported on 05/09/2017) 20 tablet 0  . prochlorperazine (COMPAZINE) 10 MG tablet Take 1 tablet (10 mg total) by mouth every 6 (six) hours as needed for nausea or vomiting. (Patient not taking: Reported on 05/09/2017) 30 tablet 0  . ferrous sulfate 325 (65 FE) MG tablet Take 325 mg by mouth daily with breakfast.     No facility-administered medications prior to visit.     PAST MEDICAL HISTORY: Past Medical History:  Diagnosis Date  . 3-vessel coronary artery disease   . Anemia    2 iron infusions  . Arthritis   . Carotid disease, bilateral (HCC)    moderate  . Chronic systolic heart failure (HCC)    EF 45%  . Coronary artery disease    06/2012:STEMI s/p CABG; 8/18 NSTEMI  . Diabetes mellitus without complication ([place])   . Diarrhea   . Dyspnea    mild  . History of blood transfusion   . Hyperlipidemia   . Ischemic cardiomyopathy    Ejection fraction of 35-40% initially, 45 - 50% on echo 2014, 53% by perfusion study 2016  . Mild mitral regurgitation   . Numbness    Right side since stroke  . OA (osteoarthritis)   . PAD (peripheral artery disease) ([place])   . Pulmonary nodule   . Renal cell cancer, left ([place]) 12/2016   Associated w/ pulmonary nodules  . Stroke ([place]) 2018  . TIA (transient ischemic attack)  10/2016  . Weakness generalized   . Wears glasses     PAST SURGICAL HISTORY: Past Surgical History:  Procedure Laterality Date  . CARDIOVASCULAR STRESS TEST    . CORONARY ARTERY BYPASS GRAFT N/A 07/05/2012   Procedure: CORONARY ARTERY BYPASS GRAFTING (CABG);  Surgeon: Ivin Poot, MD;  Location: [facility];  Service: Open Heart Surgery;  Laterality: N/A;  . INTRAOPERATIVE TRANSESOPHAGEAL ECHOCARDIOGRAM N/A 07/05/2012   Procedure: INTRAOPERATIVE TRANSESOPHAGEAL ECHOCARDIOGRAM;  Surgeon: Ivin Poot, MD;  Location: [facility];  Service: Open Heart Surgery;  Laterality: N/A;  . LEFT HEART CATH AND CORS/GRAFTS ANGIOGRAPHY N/A 11/14/2016   Procedure: LEFT HEART CATH AND CORS/GRAFTS ANGIOGRAPHY;  Surgeon: Belva Crome, MD;  Location: [facility];  Service: Cardiovascular;  Laterality: N/A;  . LEFT HEART CATHETERIZATION WITH CORONARY ANGIOGRAM N/A 07/04/2012   Procedure: LEFT HEART CATHETERIZATION WITH CORONARY ANGIOGRAM;  Surgeon: Wellington Hampshire, MD;  Location: [facility];  Service: Cardiovascular;  Laterality: N/A;  . RENAL BIOPSY    . ROBOT  ASSISTED LAPAROSCOPIC NEPHRECTOMY Left 02/21/2017   Procedure: XI ROBOTIC ASSISTED LAPAROSCOPIC NEPHRECTOMY;  Surgeon: Alexis Frock, MD;  Location: [facility];  Service: Urology;  Laterality: Left;    FAMILY HISTORY: Family History  Problem Relation Age of Onset  . Cancer Mother        leukemia  . Stroke Mother   . Cancer Maternal Grandfather        possible cancer  . Heart attack Brother 78    SOCIAL HISTORY: Social History   Socioeconomic History  . Marital status: Widowed    Spouse name: Not on file  . Number of children: Not on file  . Years of education: Not on file  . Highest education level: Not on file  Occupational History  . Occupation: Retired Fish farm manager  . Financial resource strain: Not on file  . Food insecurity:    Worry: Not on file    Inability: Not on file  . Transportation needs:    Medical: Not  on file    Non-medical: Not on file  Tobacco Use  . Smoking status: Former Smoker    Packs/day: 1.00    Years: 50.00    Pack years: 50.00    Last attempt to quit: 04/20/2012    Years since quitting: 5.2  . Smokeless tobacco: Never Used  Substance and Sexual Activity  . Alcohol use: No  . Drug use: No  . Sexual activity: Not on file    Comment: retired, lives with sister and husband  Lifestyle  . Physical activity:    Days per week: Not on file    Minutes per session: Not on file  . Stress: Not on file  Relationships  . Social connections:    Talks on phone: Not on file    Gets together: Not on file    Attends religious service: Not on file    Active member of club or organization: Not on file    Attends meetings of clubs or organizations: Not on file    Relationship status: Not on file  . Intimate partner violence:    Fear of current or ex partner: Not on file    Emotionally abused: Not on file    Physically abused: Not on file    Forced sexual activity: Not on file  Other Topics Concern  . Not on file  Social History Narrative   Lives with sister.     PHYSICAL EXAM  Vitals:   07/10/17 1400  BP: 118/74  Pulse: 98  Weight: 152 lb 12.8 oz (69.3 kg)  Height: 5\' 11"  (1.803 m)   Body mass index is 21.31 kg/m.  Generalized: Well developed, in no acute distress  Head: normocephalic and atraumatic,. Oropharynx benign  Neck: Supple, no carotid bruits  Cardiac: Regular rate rhythm, no murmur  Musculoskeletal: No deformity   Neurological examination   Mentation: Alert oriented to time, place, history taking. Attention span and concentration appropriate. Recent and remote memory intact.  Follows all commands speech and language fluent.   Cranial nerve II-XII: Pupils were equal round reactive to light extraocular movements were full, visual field were full on confrontational test. Facial sensation and strength were normal. hearing was intact to finger rubbing  bilaterally. Uvula tongue midline. head turning and shoulder shrug were normal and symmetric.Tongue protrusion into cheek strength was normal. Motor: normal bulk and tone, full strength in the BUE, BLE, fine finger movements normal, no pronator drift. No focal weakness Sensory: normal and symmetric to light  touch,  Coordination: finger-nose-finger, heel-to-shin bilaterally, no dysmetria Reflexes: 1+ upper lower and symmetric, plantar responses were flexor bilaterally. Gait and Station: Rising up from seated position without assistance, normal stance,  moderate stride, good arm swing, smooth turning, able to perform tiptoe, and heel walking without difficulty. Tandem gait is steady.  No assistive device  DIAGNOSTIC DATA (LABS, IMAGING, TESTING) - I reviewed patient records, labs, notes, testing and imaging myself where available.  Lab Results  Component Value Date   WBC 7.8 07/02/2017   HGB 13.6 07/02/2017   HCT 40.6 07/02/2017   MCV 92.1 07/02/2017   PLT 192 07/02/2017      Component Value Date/Time   NA 140 07/02/2017 0940   NA 141 03/22/2017 0919   K 5.4 (H) 07/02/2017 0940   K 5.5 (H) 03/22/2017 0919   CL 103 07/02/2017 0940   CO2 26 07/02/2017 0940   CO2 27 03/22/2017 0919   GLUCOSE 96 07/02/2017 0940   GLUCOSE 95 03/22/2017 0919   BUN 25 07/02/2017 0940   BUN 18.1 03/22/2017 0919   CREATININE 1.58 (H) 07/02/2017 0940   CREATININE 1.2 03/22/2017 0919   CALCIUM 9.9 07/02/2017 0940   CALCIUM 9.8 03/22/2017 0919   PROT 7.0 07/02/2017 0940   PROT 6.7 03/22/2017 0919   ALBUMIN 3.6 07/02/2017 0940   ALBUMIN 3.7 03/22/2017 0919   AST 29 07/02/2017 0940   AST 15 03/22/2017 0919   ALT 39 07/02/2017 0940   ALT 13 03/22/2017 0919   ALKPHOS 110 07/02/2017 0940   ALKPHOS 103 03/22/2017 0919   BILITOT 0.8 07/02/2017 0940   BILITOT 0.41 03/22/2017 0919   GFRNONAA 43 (L) 07/02/2017 0940   GFRAA 49 (L) 07/02/2017 0940   Lab Results  Component Value Date   CHOL 77 11/20/2016    HDL 30 (L) 11/20/2016   LDLCALC 34 11/20/2016   TRIG 64 11/20/2016   CHOLHDL 2.6 11/20/2016   Lab Results  Component Value Date   HGBA1C 6.7 (H) 02/19/2017   No results found for: VITAMINB12 Lab Results  Component Value Date   TSH 0.734 07/04/2012      ASSESSMENT AND PLAN   72 year male with left thalamic and subcortical lacunar infarct in September 2018 with vascular risk factors of hyperlipidemia, hypertension, diabetes and hypercoagulability from  renal cancer stage IV with pulmonary metastasis.  PLAN:Stressed the importance of management of risk factors to prevent further stroke Continue aspirin 0.81 mg for secondary stroke prevention Maintain strict control of hypertension with blood pressure goal below 130/90, today's reading118/74 Control of diabetes with hemoglobin A1c below 6.5 followed by primary care continue diabetic medications Cholesterol with LDL cholesterol less than 70, followed by PCP continue Lipitor  Exercise by walking, slowly increase ,  eat healthy diet with whole grains,  fresh fruits and vegetables Follow-up with primary care for stroke risk factor modification, maintain blood pressure goal less than 144 systolic, diabetes with R1V below 7, lipids with LDL below 70 Discharge from stroke clinic I spent 25 minutes in total face to face time with the patient more than 50% of which was spent counseling and coordination of care, reviewing test results reviewing medications and discussing and reviewing the diagnosis of stroke and management of risk factors.  These were reviewed with the patient he was also given written information.  Additional questions were answered Dennie Bible, [facility], [facility], APRN  [facility] [address] [place], [place] [address] [phone]

## 2017-07-10 ENCOUNTER — Ambulatory Visit: Payer: Medicare Other | Admitting: Nurse Practitioner

## 2017-07-10 ENCOUNTER — Encounter: Payer: Self-pay | Admitting: Nurse Practitioner

## 2017-07-10 VITALS — BP 118/74 | HR 98 | Ht 71.0 in | Wt 152.8 lb

## 2017-07-10 DIAGNOSIS — E785 Hyperlipidemia, unspecified: Secondary | ICD-10-CM | POA: Diagnosis not present

## 2017-07-10 DIAGNOSIS — Z8673 Personal history of transient ischemic attack (TIA), and cerebral infarction without residual deficits: Secondary | ICD-10-CM | POA: Diagnosis not present

## 2017-07-10 NOTE — Patient Instructions (Addendum)
Stressed the importance of management of risk factors to prevent further stroke Continue aspirin 0.81 mg for secondary stroke prevention Maintain strict control of hypertension with blood pressure goal below 130/90, today's reading118/74 Control of diabetes with hemoglobin A1c below 6.5 followed by primary care continue diabetic medications Cholesterol with LDL cholesterol less than 70, followed by PCP continue Lipitor  Exercise by walking, slowly increase ,  eat healthy diet with whole grains,  fresh fruits and vegetables Follow-up with primary care for stroke risk factor modification, maintain blood pressure goal less than 235 systolic, diabetes with T6R below 7, lipids with LDL below 70 Discharge from stroke clinic  Stroke Prevention Some health problems and behaviors may make it more likely for you to have a stroke. Below are ways to lessen your risk of having a stroke.  Be active for at least 30 minutes on most or all days.  Do not smoke. Try not to be around others who smoke.  Do not drink too much alcohol. ? Do not have more than 2 drinks a day if you are a man. ? Do not have more than 1 drink a day if you are a woman and are not pregnant.  Eat healthy foods, such as fruits and vegetables. If you were put on a specific diet, follow the diet as told.  Keep your cholesterol levels under control through diet and medicines. Look for foods that are low in saturated fat, trans fat, cholesterol, and are high in fiber.  If you have diabetes, follow all diet plans and take your medicine as told.  Ask your doctor if you need treatment to lower your blood pressure. If you have high blood pressure (hypertension), follow all diet plans and take your medicine as told by your doctor.  If you are 49-47 years old, have your blood pressure checked every 3-5 years. If you are age 39 or older, have your blood pressure checked every year.  Keep a healthy weight. Eat foods that are low in calories,  salt, saturated fat, trans fat, and cholesterol.  Do not take drugs.  Avoid birth control pills, if this applies. Talk to your doctor about the risks of taking birth control pills.  Talk to your doctor if you have sleep problems (sleep apnea).  Take all medicine as told by your doctor. ? You may be told to take aspirin or blood thinner medicine. Take this medicine as told by your doctor. ? Understand your medicine instructions.  Make sure any other conditions you have are being taken care of.  Get help right away if:  You suddenly lose feeling (you feel numb) or have weakness in your face, arm, or leg.  Your face or eyelid hangs down to one side.  You suddenly feel confused.  You have trouble talking (aphasia) or understanding what people are saying.  You suddenly have trouble seeing in one or both eyes.  You suddenly have trouble walking.  You are dizzy.  You lose your balance or your movements are clumsy (uncoordinated).  You suddenly have a very bad headache and you do not know the cause.  You have new chest pain.  Your heart feels like it is fluttering or skipping a beat (irregular heartbeat). Do not wait to see if the symptoms above go away. Get help right away. Call your local emergency services (911 in [place] Do not drive yourself to the hospital. This information is not intended to replace advice given to you by your health care provider.  Make sure you discuss any questions you have with your health care provider. Document Released: 09/05/2011 Document Revised: 08/12/2015 Document Reviewed: 09/06/2012 Elsevier Interactive Patient Education  [name].

## 2017-07-13 NOTE — Progress Notes (Signed)
I agree with the above plan 

## 2017-08-02 ENCOUNTER — Encounter: Payer: Self-pay | Admitting: Oncology

## 2017-08-02 ENCOUNTER — Other Ambulatory Visit: Payer: Self-pay

## 2017-08-02 ENCOUNTER — Inpatient Hospital Stay (HOSPITAL_BASED_OUTPATIENT_CLINIC_OR_DEPARTMENT_OTHER): Payer: Medicare Other | Admitting: Oncology

## 2017-08-02 ENCOUNTER — Inpatient Hospital Stay: Payer: Medicare Other | Attending: Oncology

## 2017-08-02 ENCOUNTER — Telehealth: Payer: Self-pay | Admitting: Oncology

## 2017-08-02 ENCOUNTER — Telehealth: Payer: Self-pay | Admitting: *Deleted

## 2017-08-02 ENCOUNTER — Other Ambulatory Visit: Payer: Self-pay | Admitting: Internal Medicine

## 2017-08-02 VITALS — BP 124/84 | HR 103 | Temp 97.8°F | Resp 17 | Ht 71.0 in | Wt 163.8 lb

## 2017-08-02 DIAGNOSIS — M129 Arthropathy, unspecified: Secondary | ICD-10-CM | POA: Diagnosis not present

## 2017-08-02 DIAGNOSIS — D649 Anemia, unspecified: Secondary | ICD-10-CM | POA: Insufficient documentation

## 2017-08-02 DIAGNOSIS — Z7982 Long term (current) use of aspirin: Secondary | ICD-10-CM

## 2017-08-02 DIAGNOSIS — C642 Malignant neoplasm of left kidney, except renal pelvis: Secondary | ICD-10-CM | POA: Insufficient documentation

## 2017-08-02 DIAGNOSIS — E119 Type 2 diabetes mellitus without complications: Secondary | ICD-10-CM | POA: Insufficient documentation

## 2017-08-02 DIAGNOSIS — R0602 Shortness of breath: Secondary | ICD-10-CM

## 2017-08-02 DIAGNOSIS — Z79899 Other long term (current) drug therapy: Secondary | ICD-10-CM

## 2017-08-02 DIAGNOSIS — I255 Ischemic cardiomyopathy: Secondary | ICD-10-CM

## 2017-08-02 DIAGNOSIS — Z7189 Other specified counseling: Secondary | ICD-10-CM

## 2017-08-02 DIAGNOSIS — M1990 Unspecified osteoarthritis, unspecified site: Secondary | ICD-10-CM

## 2017-08-02 DIAGNOSIS — Z8673 Personal history of transient ischemic attack (TIA), and cerebral infarction without residual deficits: Secondary | ICD-10-CM

## 2017-08-02 DIAGNOSIS — I340 Nonrheumatic mitral (valve) insufficiency: Secondary | ICD-10-CM | POA: Diagnosis not present

## 2017-08-02 DIAGNOSIS — I2510 Atherosclerotic heart disease of native coronary artery without angina pectoris: Secondary | ICD-10-CM | POA: Diagnosis not present

## 2017-08-02 DIAGNOSIS — I5022 Chronic systolic (congestive) heart failure: Secondary | ICD-10-CM

## 2017-08-02 DIAGNOSIS — D500 Iron deficiency anemia secondary to blood loss (chronic): Secondary | ICD-10-CM

## 2017-08-02 DIAGNOSIS — R918 Other nonspecific abnormal finding of lung field: Secondary | ICD-10-CM | POA: Diagnosis not present

## 2017-08-02 DIAGNOSIS — Z905 Acquired absence of kidney: Secondary | ICD-10-CM | POA: Insufficient documentation

## 2017-08-02 DIAGNOSIS — Z5112 Encounter for antineoplastic immunotherapy: Secondary | ICD-10-CM | POA: Insufficient documentation

## 2017-08-02 DIAGNOSIS — E785 Hyperlipidemia, unspecified: Secondary | ICD-10-CM | POA: Diagnosis not present

## 2017-08-02 LAB — CBC WITH DIFFERENTIAL (CANCER CENTER ONLY)
Basophils Absolute: 0 10*3/uL (ref 0.0–0.1)
Basophils Relative: 0 %
Eosinophils Absolute: 0.5 10*3/uL (ref 0.0–0.5)
Eosinophils Relative: 7 %
HEMATOCRIT: 33.3 % — AB (ref 38.4–49.9)
Hemoglobin: 10.9 g/dL — ABNORMAL LOW (ref 13.0–17.1)
LYMPHS ABS: 1.3 10*3/uL (ref 0.9–3.3)
LYMPHS PCT: 19 %
MCH: 32.3 pg (ref 27.2–33.4)
MCHC: 32.8 g/dL (ref 32.0–36.0)
MCV: 98.4 fL — AB (ref 79.3–98.0)
MONOS PCT: 11 %
Monocytes Absolute: 0.7 10*3/uL (ref 0.1–0.9)
NEUTROS ABS: 4.4 10*3/uL (ref 1.5–6.5)
Neutrophils Relative %: 63 %
Platelet Count: 191 10*3/uL (ref 140–400)
RBC: 3.38 MIL/uL — ABNORMAL LOW (ref 4.20–5.82)
RDW: 20.2 % — ABNORMAL HIGH (ref 11.0–14.6)
WBC Count: 6.9 10*3/uL (ref 4.0–10.3)

## 2017-08-02 LAB — CMP (CANCER CENTER ONLY)
ALBUMIN: 3.5 g/dL (ref 3.5–5.0)
ALT: 10 U/L (ref 0–55)
ANION GAP: 6 (ref 3–11)
AST: 16 U/L (ref 5–34)
Alkaline Phosphatase: 73 U/L (ref 40–150)
BUN: 21 mg/dL (ref 7–26)
CHLORIDE: 110 mmol/L — AB (ref 98–109)
CO2: 24 mmol/L (ref 22–29)
Calcium: 9.3 mg/dL (ref 8.4–10.4)
Creatinine: 1.38 mg/dL — ABNORMAL HIGH (ref 0.70–1.30)
GFR, EST AFRICAN AMERICAN: 58 mL/min — AB (ref >60)
GFR, Estimated: 50 mL/min — ABNORMAL LOW (ref >60)
Glucose, Bld: 145 mg/dL — ABNORMAL HIGH (ref 70–140)
Potassium: 5.9 mmol/L — ABNORMAL HIGH (ref 3.5–5.1)
SODIUM: 140 mmol/L (ref 136–145)
Total Bilirubin: 0.3 mg/dL (ref 0.2–1.2)
Total Protein: 6.4 g/dL (ref 6.4–8.3)

## 2017-08-02 NOTE — Telephone Encounter (Signed)
Per Mikey Bussing, NP called patient to inquiry about intake of Potassium supplement and found out that the patient has not been taking any potassium supplements but does report eating a banana sandwich everyday and eating oranges daily.  Reviewed with Erasmo Downer and since patient was very concerned about the side effect of Kayexalate (diarrhea) she decided to hold off on prescribing that at this time and requested the patient make diet changes to see if he can get his lab values within normal limits and next week on 05/24 when he labs are rechecked if the potassium remains high she will then consider prescribing the medication to resolve the high potassium levels.  Patient aware and expressed understanding.

## 2017-08-02 NOTE — Progress Notes (Signed)
[facility] OFFICE PROGRESS NOTE  Via, [name], MD [place] [address]  DIAGNOSIS: [name].The patient presented with an11 cm left kidney mass and pulmonary nodules.  PRIOR THERAPY: 1) Status post left nephrectomy on 02/21/2017. 2)  Cabometyx 60 mg daily. Started 01/21/2017.Status post 1 month of treatment. Treatment was then placed on hold for nephrectomy. Resuming treatment on 03/22/2017.Status post 3 weeks of treatment. The patient was advised to hold Cabometyxstarting on 04/11/2017. This dose will be changed to 40 mg p.o. Daily.  Started 05/12/2017.  Status post 2 months of treatment.  Patient stopped secondary to intolerance.  CURRENT THERAPY: Nivolumab 480 mg IV given every 4 weeks.  First dose expected on 08/09/2017.  INTERVAL HISTORY: Trevor [name] 72 y.o. male returns for routine follow-up visit by himself.  The patient is feeling better today.  He has been off Cabometyx and his energy level has improved.  He is also getting back some of his lost weight.  His diarrhea has resolved.  He reports that his quality of life has overall improved.  The patient denies fevers and chills.  Denies chest pain, shortness of breath, cough, hemoptysis.  Denies nausea, vomiting, constipation, diarrhea.  Denies bleeding.  The patient is here for evaluation and discussion of his treatment options.  MEDICAL HISTORY: Past Medical History:  Diagnosis Date  . 3-vessel coronary artery disease   . Anemia    2 iron infusions  . Arthritis   . Carotid disease, bilateral (HCC)    moderate  . Chronic systolic heart failure (HCC)    EF 45%  . Coronary artery disease    06/2012:STEMI s/p CABG; 8/18 NSTEMI  . Diabetes mellitus without complication ([facility])   . Diarrhea   . Dyspnea    mild  . History of blood transfusion   . Hyperlipidemia   . Ischemic cardiomyopathy    Ejection fraction of 35-40% initially, 45 - 50% on echo 2014, 53% by  perfusion study 2016  . Mild mitral regurgitation   . Numbness    Right side since stroke  . OA (osteoarthritis)   . PAD (peripheral artery disease) ([facility])   . Pulmonary nodule   . Renal cell cancer, left ([facility]) 12/2016   Associated w/ pulmonary nodules  . Stroke ([facility]) 2018  . TIA (transient ischemic attack) 10/2016  . Weakness generalized   . Wears glasses     ALLERGIES:  has No Known Allergies.  MEDICATIONS:  Current Outpatient Medications  Medication Sig Dispense Refill  . aspirin EC 81 MG tablet Take 81 mg by mouth daily.    Marland Kitchen atorvastatin (LIPITOR) 40 MG tablet Take 1 tablet (40 mg total) by mouth daily. 30 tablet 6  . carvedilol (COREG) 3.125 MG tablet Take 1 tablet (3.125 mg total) by mouth 2 (two) times daily. 180 tablet 2  . metFORMIN (GLUCOPHAGE) 1000 MG tablet Take 1 tablet (1,000 mg total) by mouth 2 (two) times daily.  0  . pantoprazole (PROTONIX) 40 MG tablet Take 1 tablet (40 mg total) by mouth at bedtime. 30 tablet 0  . cabozantinib S-Malate (CABOMETYX) 40 MG TABS Take 40 mg by mouth every morning. (Patient not taking: Reported on 07/10/2017) 30 tablet 2  . HYDROcodone-acetaminophen (NORCO) 5-325 MG tablet Take 1-2 tablets by mouth every 6 (six) hours as needed for moderate pain or severe pain. (Patient not taking: Reported on 05/09/2017) 30 tablet 0  . loperamide (IMODIUM) 2 MG capsule Take 4 mg  by mouth as needed for diarrhea or loose stools.    . ondansetron (ZOFRAN ODT) 4 MG disintegrating tablet Take 1 tablet (4 mg total) by mouth every 8 (eight) hours as needed for nausea or vomiting. (Patient not taking: Reported on 05/09/2017) 20 tablet 0  . prochlorperazine (COMPAZINE) 10 MG tablet Take 1 tablet (10 mg total) by mouth every 6 (six) hours as needed for nausea or vomiting. (Patient not taking: Reported on 05/09/2017) 30 tablet 0   No current facility-administered medications for this visit.     SURGICAL HISTORY:  Past Surgical History:  Procedure Laterality Date   . CARDIOVASCULAR STRESS TEST    . CORONARY ARTERY BYPASS GRAFT N/A 07/05/2012   Procedure: CORONARY ARTERY BYPASS GRAFTING (CABG);  Surgeon: Ivin Poot, MD;  Location: [facility];  Service: Open Heart Surgery;  Laterality: N/A;  . INTRAOPERATIVE TRANSESOPHAGEAL ECHOCARDIOGRAM N/A 07/05/2012   Procedure: INTRAOPERATIVE TRANSESOPHAGEAL ECHOCARDIOGRAM;  Surgeon: Ivin Poot, MD;  Location: [facility];  Service: Open Heart Surgery;  Laterality: N/A;  . LEFT HEART CATH AND CORS/GRAFTS ANGIOGRAPHY N/A 11/14/2016   Procedure: LEFT HEART CATH AND CORS/GRAFTS ANGIOGRAPHY;  Surgeon: Belva Crome, MD;  Location: [facility] CV [facility];  Service: Cardiovascular;  Laterality: N/A;  . LEFT HEART CATHETERIZATION WITH CORONARY ANGIOGRAM N/A 07/04/2012   Procedure: LEFT HEART CATHETERIZATION WITH CORONARY ANGIOGRAM;  Surgeon: Wellington Hampshire, MD;  Location: [facility] CATH LAB;  Service: Cardiovascular;  Laterality: N/A;  . RENAL BIOPSY    . ROBOT ASSISTED LAPAROSCOPIC NEPHRECTOMY Left 02/21/2017   Procedure: XI ROBOTIC ASSISTED LAPAROSCOPIC NEPHRECTOMY;  Surgeon: Alexis Frock, MD;  Location: [facility];  Service: Urology;  Laterality: Left;    REVIEW OF SYSTEMS:   Review of Systems  Constitutional: Negative for appetite change, chills, fatigue, fever and unexpected weight change.  HENT:   Negative for mouth sores, nosebleeds, sore throat and trouble swallowing.   Eyes: Negative for eye problems and icterus.  Respiratory: Negative for cough, hemoptysis, shortness of breath and wheezing.   Cardiovascular: Negative for chest pain and leg swelling.  Gastrointestinal: Negative for abdominal pain, constipation, diarrhea, nausea and vomiting.  Genitourinary: Negative for bladder incontinence, difficulty urinating, dysuria, frequency and hematuria.   Musculoskeletal: Negative for back pain, gait problem, neck pain and neck stiffness.  Skin: Negative for itching and rash.  Neurological: Negative for dizziness, extremity weakness,  gait problem, headaches, light-headedness and seizures.  Hematological: Negative for adenopathy. Does not bruise/bleed easily.  Psychiatric/Behavioral: Negative for confusion, depression and sleep disturbance. The patient is not nervous/anxious.     PHYSICAL EXAMINATION:  Blood pressure 124/84, pulse (!) 103, temperature 97.8 F (36.6 C), temperature source Oral, resp. rate 17, height 5\' 11"  (1.803 m), weight 163 lb 12.8 oz (74.3 kg), SpO2 100 %.  ECOG PERFORMANCE STATUS: 1 - Symptomatic but completely ambulatory  Physical Exam  Constitutional: Oriented to person, place, and time and well-developed, well-nourished, and in no distress. No distress.  HENT:  Head: Normocephalic and atraumatic.  Mouth/Throat: Oropharynx is clear and moist. No oropharyngeal exudate.  Eyes: Conjunctivae are normal. Right eye exhibits no discharge. Left eye exhibits no discharge. No scleral icterus.  Neck: Normal range of motion. Neck supple.  Cardiovascular: Normal rate, regular rhythm, normal heart sounds and intact distal pulses.   Pulmonary/Chest: Effort normal and breath sounds normal. No respiratory distress. No wheezes. No rales.  Abdominal: Soft. Bowel sounds are normal. Exhibits no distension and no mass. There is no tenderness.  Musculoskeletal: Normal range of motion.  Exhibits no edema.  Lymphadenopathy:    No cervical adenopathy.  Neurological: Alert and oriented to person, place, and time. Exhibits normal muscle tone. Gait normal. Coordination normal.  Skin: Skin is warm and dry. No rash noted. Not diaphoretic. No erythema. No pallor.  Psychiatric: Mood, memory and judgment normal.  Vitals reviewed.  LABORATORY DATA: Lab Results  Component Value Date   WBC 6.9 08/02/2017   HGB 10.9 (L) 08/02/2017   HCT 33.3 (L) 08/02/2017   MCV 98.4 (H) 08/02/2017   PLT 191 08/02/2017      Chemistry      Component Value Date/Time   NA 140 08/02/2017 0824   NA 141 03/22/2017 0919   K 5.9 (H)  08/02/2017 0824   K 5.5 (H) 03/22/2017 0919   CL 110 (H) 08/02/2017 0824   CO2 24 08/02/2017 0824   CO2 27 03/22/2017 0919   BUN 21 08/02/2017 0824   BUN 18.1 03/22/2017 0919   CREATININE 1.38 (H) 08/02/2017 0824   CREATININE 1.2 03/22/2017 0919      Component Value Date/Time   CALCIUM 9.3 08/02/2017 0824   CALCIUM 9.8 03/22/2017 0919   ALKPHOS 73 08/02/2017 0824   ALKPHOS 103 03/22/2017 0919   AST 16 08/02/2017 0824   AST 15 03/22/2017 0919   ALT 10 08/02/2017 0824   ALT 13 03/22/2017 0919   BILITOT 0.3 08/02/2017 0824   BILITOT 0.41 03/22/2017 0919       RADIOGRAPHIC STUDIES:  No results found.   ASSESSMENT/PLAN:  Renal cell cancer [facility]) This is a very pleasant 72 year old white male diagnosed with metastatic renal cell carcinoma, chromophobe type presented with large left kidney mass as well as multiple bilateral pulmonary nodules. He is status post left nephrectomy on February 21, 2017. The patient received treatment with Cabometyx initially at a dose of 60 mg p.o. daily but he has a rough time tolerating this treatment with several episodes of diarrhea as well as nausea. His treatment was switched toCabometyx at a dose of 40 mg p.o. dailystatus post 2 months.  Treatment was stopped secondary to intolerance. The patient felt much better off treatment.  He has gained back his lost weight and his diarrhea has resolved.  His quality of life has overall improved. The patient is back today to discuss treatment options.  Patient was seen with Dr. Julien Nordmann.  Treatment with immunotherapy consisting of Nivolumab every 4 weeks versus continued observation was discussed with the patient.  The patient is interested in pursuing treatment with immunotherapy at this time.  He was reminded of the adverse effects of this therapy including but not limited to immune mediated the skin rash, diarrhea, inflammation of the lung, kidney, liver, thyroid or other endocrine dysfunction. Anticipate  first dose of treatment on 08/09/2017. Follow-up visit will be in approximately 5 weeks for evaluation prior to cycle #2.  For his anemia, will check iron studies next week with next lab draw.  May consider additional Feraheme if his iron studies are low.  If his hemoglobin drops to 8.0 or less, we will consider a blood transfusion.  The patient was advised to call immediately if he has any concerning symptoms in the interval. The patient voices understanding of current disease status and treatment options and is in agreement with the current care plan. All questions were answered. The patient knows to call the clinic with any problems, questions or concerns. We can certainly see the patient much sooner if necessary.   Orders Placed  This Encounter  Procedures  . TSH    Standing Status:   Standing    Number of Occurrences:   12    Standing Expiration Date:   08/03/2018  . Ferritin    Standing Status:   Future    Standing Expiration Date:   08/03/2018  . Iron and TIBC    Standing Status:   Future    Standing Expiration Date:   08/03/2018  . CBC with Differential (Cancer Center Only)    Standing Status:   Standing    Number of Occurrences:   12    Standing Expiration Date:   08/03/2018  . CMP ([facility] only)    Standing Status:   Standing    Number of Occurrences:   12    Standing Expiration Date:   08/03/2018   Mikey Bussing, DNP, AGPCNP-BC, AOCNP 08/02/17   ADDENDUM: Hematology/Oncology Attending: I had a face-to-face encounter with the patient today.  I recommended his care plan.  This is a very pleasant 72 years old white male with metastatic renal cell carcinoma.  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 The patient was a started on systemic therapy with Cabometyx initially at 60 mg p.o. daily which was later reduced to 40 mg p.o. daily but this was discontinued few weeks ago secondary to intolerance.  He has good response to this  treatment but he has a lot of significant adverse effect including lack of appetite, weight loss as well as nausea, vomiting and diarrhea. He has been off treatment for the last few weeks. He is feeling much better and gained close to 10 pounds. I had a lengthy discussion with the patient today about his current condition and treatment options.  He was given the option of continuous observation and close monitoring versus consideration of treatment with second line immunotherapy with Nivolumab 480 mg IV every 4 weeks.  The patient is interested in proceeding with immunotherapy.  I discussed with him the adverse effect of this treatment including but not limited to immunotherapy mediated skin rash, diarrhea, inflammation of the lung, kidney, liver, thyroid or other endocrine dysfunction. He is expected to start the first cycle of this treatment next week.  He will come back for follow-up visit in 5 weeks for evaluation with the start of cycle #2. The patient was advised to call immediately if he has any concerning symptoms in the interval.  Disclaimer: This note was dictated with voice recognition software. Similar sounding words can inadvertently be transcribed and may be missed upon review. Eilleen Kempf, MD 08/02/17

## 2017-08-02 NOTE — Telephone Encounter (Signed)
Scheduled appt per 5/16 los - Gave patient AVS and calender per los. Unable to schedule appt for next week due to capped day - also pt unsure of treatment to start next week - will message [facility] and contact patient when scheduled.

## 2017-08-02 NOTE — Patient Instructions (Signed)
Nivolumab injection What is this medicine? NIVOLUMAB (nye VOL ue mab) is a monoclonal antibody. It is used to treat melanoma, lung cancer, kidney cancer, head and neck cancer, Hodgkin lymphoma, urothelial cancer, colon cancer, and liver cancer. This medicine may be used for other purposes; ask your health care provider or pharmacist if you have questions. COMMON BRAND NAME(S): Opdivo What should I tell my health care provider before I take this medicine? They need to know if you have any of these conditions: -diabetes -immune system problems -kidney disease -liver disease -lung disease -organ transplant -stomach or intestine problems -thyroid disease -an unusual or allergic reaction to nivolumab, other medicines, foods, dyes, or preservatives -pregnant or trying to get pregnant -breast-feeding How should I use this medicine? This medicine is for infusion into a vein. It is given by a health care professional in a hospital or clinic setting. A special MedGuide will be given to you before each treatment. Be sure to read this information carefully each time. Talk to your pediatrician regarding the use of this medicine in children. While this drug may be prescribed for children as young as 12 years for selected conditions, precautions do apply. Overdosage: If you think you have taken too much of this medicine contact a poison control center or emergency room at once. NOTE: This medicine is only for you. Do not share this medicine with others. What if I miss a dose? It is important not to miss your dose. Call your doctor or health care professional if you are unable to keep an appointment. What may interact with this medicine? Interactions have not been studied. Give your health care provider a list of all the medicines, herbs, non-prescription drugs, or dietary supplements you use. Also tell them if you smoke, drink alcohol, or use illegal drugs. Some items may interact with your  medicine. This list may not describe all possible interactions. Give your health care provider a list of all the medicines, herbs, non-prescription drugs, or dietary supplements you use. Also tell them if you smoke, drink alcohol, or use illegal drugs. Some items may interact with your medicine. What should I watch for while using this medicine? This drug may make you feel generally unwell. Continue your course of treatment even though you feel ill unless your doctor tells you to stop. You may need blood work done while you are taking this medicine. Do not become pregnant while taking this medicine or for 5 months after stopping it. Women should inform their doctor if they wish to become pregnant or think they might be pregnant. There is a potential for serious side effects to an unborn child. Talk to your health care professional or pharmacist for more information. Do not breast-feed an infant while taking this medicine. What side effects may I notice from receiving this medicine? Side effects that you should report to your doctor or health care professional as soon as possible: -allergic reactions like skin rash, itching or hives, swelling of the face, lips, or tongue -black, tarry stools -blood in the urine -bloody or watery diarrhea -changes in vision -change in sex drive -changes in emotions or moods -chest pain -confusion -cough -decreased appetite -diarrhea -facial flushing -feeling faint or lightheaded -fever, chills -hair loss -hallucination, loss of contact with reality -headache -irritable -joint pain -loss of memory -muscle pain -muscle weakness -seizures -shortness of breath -signs and symptoms of high blood sugar such as dizziness; dry mouth; dry skin; fruity breath; nausea; stomach pain; increased hunger or thirst; increased   urination -signs and symptoms of kidney injury like trouble passing urine or change in the amount of urine -signs and symptoms of liver injury  like dark yellow or brown urine; general ill feeling or flu-like symptoms; light-colored stools; loss of appetite; nausea; right upper belly pain; unusually weak or tired; yellowing of the eyes or skin -stiff neck -swelling of the ankles, feet, hands -weight gain Side effects that usually do not require medical attention (report to your doctor or health care professional if they continue or are bothersome): -bone pain -constipation -tiredness -vomiting This list may not describe all possible side effects. Call your doctor for medical advice about side effects. You may report side effects to FDA at 1-800-FDA-1088. Where should I keep my medicine? This drug is given in a hospital or clinic and will not be stored at home. NOTE: This sheet is a summary. It may not cover all possible information. If you have questions about this medicine, talk to your doctor, pharmacist, or health care provider.  2018 Elsevier/Gold Standard (2015-12-13 17:49:34)  

## 2017-08-02 NOTE — Progress Notes (Signed)
START ON PATHWAY REGIMEN - Renal Cell     A cycle is every 28 days:     Nivolumab   **Always confirm dose/schedule in your pharmacy ordering system**    Patient Characteristics: Metastatic, Clear Cell, Second Line, Prior Anti-Angiogenic Treatment AJCC M Category: M1 AJCC 8 Stage Grouping: IV Current evidence of distant metastases<= Yes AJCC T Category: T2a AJCC N Category: N0 Does patient have oligometastatic disease<= No Histology: Clear Cell Line of therapy: Second Line Intent of Therapy: Non-Curative / Palliative Intent, Discussed with Patient

## 2017-08-02 NOTE — Assessment & Plan Note (Addendum)
This is a very pleasant 72 year old white male diagnosed with metastatic renal cell carcinoma, chromophobe type presented with large left kidney mass as well as multiple bilateral pulmonary nodules. He is status post left nephrectomy on February 21, 2017. The patient received treatment with Cabometyx initially at a dose of 60 mg p.o. daily but he has a rough time tolerating this treatment with several episodes of diarrhea as well as nausea. His treatment was switched toCabometyx at a dose of 40 mg p.o. dailystatus post 2 months.  Treatment was stopped secondary to intolerance. The patient felt much better off treatment.  He has gained back his lost weight and his diarrhea has resolved.  His quality of life has overall improved. The patient is back today to discuss treatment options.  Patient was seen with Dr. Julien Nordmann.  Treatment with immunotherapy consisting of Nivolumab every 4 weeks versus continued observation was discussed with the patient.  The patient is interested in pursuing treatment with immunotherapy at this time.  He was reminded of the adverse effects of this therapy including but not limited to immune mediated the skin rash, diarrhea, inflammation of the lung, kidney, liver, thyroid or other endocrine dysfunction. Anticipate first dose of treatment on 08/09/2017. Follow-up visit will be in approximately 5 weeks for evaluation prior to cycle #2.  For his anemia, will check iron studies next week with next lab draw.  May consider additional Feraheme if his iron studies are low.  If his hemoglobin drops to 8.0 or less, we will consider a blood transfusion.  The patient was advised to call immediately if he has any concerning symptoms in the interval. The patient voices understanding of current disease status and treatment options and is in agreement with the current care plan. All questions were answered. The patient knows to call the clinic with any problems, questions or concerns. We can  certainly see the patient much sooner if necessary.

## 2017-08-06 ENCOUNTER — Other Ambulatory Visit: Payer: Self-pay | Admitting: Pharmacist

## 2017-08-07 ENCOUNTER — Ambulatory Visit: Payer: Medicare Other | Admitting: Cardiovascular Disease

## 2017-08-10 ENCOUNTER — Inpatient Hospital Stay: Payer: Medicare Other

## 2017-08-10 ENCOUNTER — Encounter: Payer: Self-pay | Admitting: Internal Medicine

## 2017-08-10 VITALS — BP 126/82 | HR 85 | Temp 97.8°F | Resp 16

## 2017-08-10 DIAGNOSIS — C642 Malignant neoplasm of left kidney, except renal pelvis: Secondary | ICD-10-CM

## 2017-08-10 DIAGNOSIS — D500 Iron deficiency anemia secondary to blood loss (chronic): Secondary | ICD-10-CM

## 2017-08-10 DIAGNOSIS — Z5112 Encounter for antineoplastic immunotherapy: Secondary | ICD-10-CM

## 2017-08-10 LAB — TSH: TSH: 2.545 u[IU]/mL (ref 0.320–4.118)

## 2017-08-10 LAB — IRON AND TIBC
IRON: 43 ug/dL (ref 42–163)
Saturation Ratios: 16 % — ABNORMAL LOW (ref 42–163)
TIBC: 264 ug/dL (ref 202–409)
UIBC: 221 ug/dL

## 2017-08-10 LAB — CBC WITH DIFFERENTIAL (CANCER CENTER ONLY)
BASOS PCT: 0 %
Basophils Absolute: 0 10*3/uL (ref 0.0–0.1)
EOS ABS: 0.5 10*3/uL (ref 0.0–0.5)
Eosinophils Relative: 8 %
HEMATOCRIT: 30 % — AB (ref 38.4–49.9)
Hemoglobin: 9.9 g/dL — ABNORMAL LOW (ref 13.0–17.1)
Lymphocytes Relative: 17 %
Lymphs Abs: 1.1 10*3/uL (ref 0.9–3.3)
MCH: 32.1 pg (ref 27.2–33.4)
MCHC: 33.1 g/dL (ref 32.0–36.0)
MCV: 97.2 fL (ref 79.3–98.0)
MONO ABS: 0.7 10*3/uL (ref 0.1–0.9)
Monocytes Relative: 10 %
Neutro Abs: 4.1 10*3/uL (ref 1.5–6.5)
Neutrophils Relative %: 65 %
Platelet Count: 174 10*3/uL (ref 140–400)
RBC: 3.08 MIL/uL — ABNORMAL LOW (ref 4.20–5.82)
RDW: 18.3 % — AB (ref 11.0–14.6)
WBC Count: 6.3 10*3/uL (ref 4.0–10.3)

## 2017-08-10 LAB — CMP (CANCER CENTER ONLY)
ALBUMIN: 3.7 g/dL (ref 3.5–5.0)
ALK PHOS: 72 U/L (ref 40–150)
ALT: 18 U/L (ref 0–55)
AST: 22 U/L (ref 5–34)
Anion gap: 10 (ref 3–11)
BUN: 22 mg/dL (ref 7–26)
CALCIUM: 9.2 mg/dL (ref 8.4–10.4)
CO2: 24 mmol/L (ref 22–29)
CREATININE: 1.39 mg/dL — AB (ref 0.70–1.30)
Chloride: 109 mmol/L (ref 98–109)
GFR, Est AFR Am: 57 mL/min — ABNORMAL LOW (ref >60)
GFR, Estimated: 49 mL/min — ABNORMAL LOW (ref >60)
GLUCOSE: 93 mg/dL (ref 70–140)
Potassium: 4.7 mmol/L (ref 3.5–5.1)
SODIUM: 143 mmol/L (ref 136–145)
Total Bilirubin: 0.3 mg/dL (ref 0.2–1.2)
Total Protein: 6.4 g/dL (ref 6.4–8.3)

## 2017-08-10 LAB — FERRITIN: FERRITIN: 37 ng/mL (ref 22–316)

## 2017-08-10 MED ORDER — SODIUM CHLORIDE 0.9 % IV SOLN
Freq: Once | INTRAVENOUS | Status: AC
  Administered 2017-08-10: 10:00:00 via INTRAVENOUS

## 2017-08-10 MED ORDER — NIVOLUMAB CHEMO INJECTION 100 MG/10ML
480.0000 mg | Freq: Once | INTRAVENOUS | Status: AC
  Administered 2017-08-10: 480 mg via INTRAVENOUS
  Filled 2017-08-10: qty 48

## 2017-08-10 NOTE — Progress Notes (Signed)
Called pt to introduce myself as his Arboriculturist and to discuss copay assistance.  Pt gave me consent to apply in his behalf.  He was approved w/ PAF for $6,500 for drugs associated w/ his Dx for 12 months from 08/10/17.  Notified pt of the outcome.  He is overqualified for the [employer].  I will give him my card at his next visit for any questions or concerns he may have in the future.

## 2017-08-10 NOTE — Patient Instructions (Signed)
[facility] Discharge Instructions for Patients Receiving Chemotherapy  Today you received the following chemotherapy agents: Opdivo  To help prevent nausea and vomiting after your treatment, we encourage you to take your nausea medication as directed.   If you develop nausea and vomiting that is not controlled by your nausea medication, call the clinic.   BELOW ARE SYMPTOMS THAT SHOULD BE REPORTED IMMEDIATELY:  *FEVER GREATER THAN 100.5 F  *CHILLS WITH OR WITHOUT FEVER  NAUSEA AND VOMITING THAT IS NOT CONTROLLED WITH YOUR NAUSEA MEDICATION  *UNUSUAL SHORTNESS OF BREATH  *UNUSUAL BRUISING OR BLEEDING  TENDERNESS IN MOUTH AND THROAT WITH OR WITHOUT PRESENCE OF ULCERS  *URINARY PROBLEMS  *BOWEL PROBLEMS  UNUSUAL RASH Items with * indicate a potential emergency and should be followed up as soon as possible.  Feel free to call the clinic should you have any questions or concerns. The clinic phone number is [phone].  Please show the [facility] at check-in to the Emergency Department and triage nurse.  Nivolumab injection What is this medicine? NIVOLUMAB (nye VOL ue mab) is a monoclonal antibody. It is used to treat melanoma, lung cancer, kidney cancer, head and neck cancer, Hodgkin lymphoma, urothelial cancer, colon cancer, and liver cancer. This medicine may be used for other purposes; ask your health care provider or pharmacist if you have questions. COMMON BRAND NAME(S): Opdivo What should I tell my health care provider before I take this medicine? They need to know if you have any of these conditions: -diabetes -immune system problems -kidney disease -liver disease -lung disease -organ transplant -stomach or intestine problems -thyroid disease -an unusual or allergic reaction to nivolumab, other medicines, foods, dyes, or preservatives -pregnant or trying to get pregnant -breast-feeding How should I use this medicine? This medicine is for  infusion into a vein. It is given by a health care professional in a hospital or clinic setting. A special MedGuide will be given to you before each treatment. Be sure to read this information carefully each time. Talk to your pediatrician regarding the use of this medicine in children. While this drug may be prescribed for children as young as 12 years for selected conditions, precautions do apply. Overdosage: If you think you have taken too much of this medicine contact a poison control center or emergency room at once. NOTE: This medicine is only for you. Do not share this medicine with others. What if I miss a dose? It is important not to miss your dose. Call your doctor or health care professional if you are unable to keep an appointment. What may interact with this medicine? Interactions have not been studied. Give your health care provider a list of all the medicines, herbs, non-prescription drugs, or dietary supplements you use. Also tell them if you smoke, drink alcohol, or use illegal drugs. Some items may interact with your medicine. This list may not describe all possible interactions. Give your health care provider a list of all the medicines, herbs, non-prescription drugs, or dietary supplements you use. Also tell them if you smoke, drink alcohol, or use illegal drugs. Some items may interact with your medicine. What should I watch for while using this medicine? This drug may make you feel generally unwell. Continue your course of treatment even though you feel ill unless your doctor tells you to stop. You may need blood work done while you are taking this medicine. Do not become pregnant while taking this medicine or for 5 months after stopping  it. Women should inform their doctor if they wish to become pregnant or think they might be pregnant. There is a potential for serious side effects to an unborn child. Talk to your health care professional or pharmacist for more information. Do  not breast-feed an infant while taking this medicine. What side effects may I notice from receiving this medicine? Side effects that you should report to your doctor or health care professional as soon as possible: -allergic reactions like skin rash, itching or hives, swelling of the face, lips, or tongue -black, tarry stools -blood in the urine -bloody or watery diarrhea -changes in vision -change in sex drive -changes in emotions or moods -chest pain -confusion -cough -decreased appetite -diarrhea -facial flushing -feeling faint or lightheaded -fever, chills -hair loss -hallucination, loss of contact with reality -headache -irritable -joint pain -loss of memory -muscle pain -muscle weakness -seizures -shortness of breath -signs and symptoms of high blood sugar such as dizziness; dry mouth; dry skin; fruity breath; nausea; stomach pain; increased hunger or thirst; increased urination -signs and symptoms of kidney injury like trouble passing urine or change in the amount of urine -signs and symptoms of liver injury like dark yellow or brown urine; general ill feeling or flu-like symptoms; light-colored stools; loss of appetite; nausea; right upper belly pain; unusually weak or tired; yellowing of the eyes or skin -stiff neck -swelling of the ankles, feet, hands -weight gain Side effects that usually do not require medical attention (report to your doctor or health care professional if they continue or are bothersome): -bone pain -constipation -tiredness -vomiting This list may not describe all possible side effects. Call your doctor for medical advice about side effects. You may report side effects to FDA at 1-800-FDA-1088. Where should I keep my medicine? This drug is given in a hospital or clinic and will not be stored at home. NOTE: This sheet is a summary. It may not cover all possible information. If you have questions about this medicine, talk to your doctor,  pharmacist, or health care provider.  2018 Elsevier/Gold Standard (2015-12-13 17:49:34)

## 2017-08-15 ENCOUNTER — Telehealth: Payer: Self-pay

## 2017-08-15 NOTE — Telephone Encounter (Signed)
Called and spoke with patient regarding his low iron level per Mikey Bussing, NP. Informed patient to begin taking iron supplements at home (325mg  tablets, 2-3 tablets per day with Vitamin C or orange juice. Pt verbalizes understanding and agrees with plan of care.

## 2017-08-15 NOTE — Telephone Encounter (Signed)
-----   Message from Maryanna Shape, NP sent at 08/15/2017 10:45 AM EDT ----- Please call the patient and let him know that his iron is drifting downward.  Recommend that he begin an iron supplement.  He can begin over-the-counter ferrous sulfate 325 mg 2 to 3 tablets/day.  Recommend that he take this with vitamin C or orange juice to increase the absorption.  Thanks

## 2017-08-24 NOTE — Progress Notes (Signed)
This encounter was created in error - please disregard.

## 2017-08-28 ENCOUNTER — Encounter: Payer: Self-pay | Admitting: Cardiovascular Disease

## 2017-08-28 ENCOUNTER — Ambulatory Visit: Payer: Medicare Other | Admitting: Cardiovascular Disease

## 2017-08-28 VITALS — BP 142/77 | HR 83 | Ht 71.0 in | Wt 171.2 lb

## 2017-08-28 DIAGNOSIS — E785 Hyperlipidemia, unspecified: Secondary | ICD-10-CM

## 2017-08-28 DIAGNOSIS — I255 Ischemic cardiomyopathy: Secondary | ICD-10-CM | POA: Diagnosis not present

## 2017-08-28 DIAGNOSIS — I25810 Atherosclerosis of coronary artery bypass graft(s) without angina pectoris: Secondary | ICD-10-CM | POA: Diagnosis not present

## 2017-08-28 DIAGNOSIS — I779 Disorder of arteries and arterioles, unspecified: Secondary | ICD-10-CM | POA: Diagnosis not present

## 2017-08-28 DIAGNOSIS — I739 Peripheral vascular disease, unspecified: Secondary | ICD-10-CM | POA: Diagnosis not present

## 2017-08-28 MED ORDER — CARVEDILOL 6.25 MG PO TABS: 6.2500 mg | ORAL_TABLET | Freq: Two times a day (BID) | ORAL | 1 refills | Status: DC

## 2017-08-28 NOTE — Progress Notes (Signed)
Cardiology Office Note   Date:  08/28/2017   ID:  Erich Kochan, DOB 1945-06-16, MRN [id number]  PCP:  [name], MD  Cardiologist:   [name], MD   Chief Complaint  Patient presents with  . Follow-up    3 months;Pt states no Sx.       History of Present Illness: Trevor [name] is a 72 y.o. male who presents for a followup visit regarding coronary artery disease status post  CABG,  ischemic cardiomyopathy and peripheral arterial disease. He presented in April of 2014 with anterior ST elevation myocardial infarction and was found to have significant three-vessel coronary artery disease and moderate left main stenosis. The patient underwent CABG at that time.  He is known to have peripheral arterial disease with moderately reduced ABI on the right due to severe SFA disease. He is being treated medically due to lack of claudication.  He was hospitalized in August, 2018 with severe symptomatic anemia with hemoglobin of 5.6. He was also found to have mildly elevated troponin consistent with non-ST elevation myocardial infarction. The patient was transfused. He underwent cardiac catheterization which showed occluded SVG to RCA and SVG to OM. His LIMA to LAD was patent. Ejection fraction was 20-25%. No revascularization could be done with PCI given his symptomatic anemia and also the fact that he was found to have renal mass highly suggestive of cancer with pulmonary nodules. The patient was treated medically. He returned 2 days later with a stroke which was confirmed by MRI.  He was treated medically for coronary artery disease.  He had a repeat echocardiogram done in September which showed an EF of 45-50% with mild mitral regurgitation.  Carotid Doppler showed moderate bilateral disease worse on the right side.  He is status post left nephrectomy in December 2018.  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  He has been tolerating  current infusion with no significant side effects.  He is no longer having diarrhea.  He has been doing well overall and gained 20 pounds back. No chest pain or lower extremity claudication.  He reports that shortness of breath is stable.  Past Medical History:  Diagnosis Date  . 3-vessel coronary artery disease   . Anemia    2 iron infusions  . Arthritis   . Carotid disease, bilateral (HCC)    moderate  . Chronic systolic heart failure (HCC)    EF 45%  . Coronary artery disease    06/2012:STEMI s/p CABG; 8/18 NSTEMI  . Diabetes mellitus without complication ([place])   . Diarrhea   . Dyspnea    mild  . History of blood transfusion   . Hyperlipidemia   . Ischemic cardiomyopathy    Ejection fraction of 35-40% initially, 45 - 50% on echo 2014, 53% by perfusion study 2016  . Mild mitral regurgitation   . Numbness    Right side since stroke  . OA (osteoarthritis)   . PAD (peripheral artery disease) ([place])   . Pulmonary nodule   . Renal cell cancer, left ([place]) 12/2016   Associated w/ pulmonary nodules  . Stroke ([place]) 2018  . TIA (transient ischemic attack) 10/2016  . Weakness generalized   . Wears glasses     Past Surgical History:  Procedure Laterality Date  . CARDIOVASCULAR STRESS TEST    . CORONARY ARTERY BYPASS GRAFT N/A 07/05/2012   Procedure: CORONARY ARTERY BYPASS GRAFTING (CABG);  Surgeon: Ivin Poot, MD;  Location: [facility];  Service: Open Heart Surgery;  Laterality: N/A;  . INTRAOPERATIVE TRANSESOPHAGEAL ECHOCARDIOGRAM N/A 07/05/2012   Procedure: INTRAOPERATIVE TRANSESOPHAGEAL ECHOCARDIOGRAM;  Surgeon: Ivin Poot, MD;  Location: [facility];  Service: Open Heart Surgery;  Laterality: N/A;  . LEFT HEART CATH AND CORS/GRAFTS ANGIOGRAPHY N/A 11/14/2016   Procedure: LEFT HEART CATH AND CORS/GRAFTS ANGIOGRAPHY;  Surgeon: Belva Crome, MD;  Location: [facility];  Service: Cardiovascular;  Laterality: N/A;  . LEFT HEART CATHETERIZATION WITH CORONARY ANGIOGRAM N/A  07/04/2012   Procedure: LEFT HEART CATHETERIZATION WITH CORONARY ANGIOGRAM;  Surgeon: Wellington Hampshire, MD;  Location: [facility];  Service: Cardiovascular;  Laterality: N/A;  . RENAL BIOPSY    . ROBOT ASSISTED LAPAROSCOPIC NEPHRECTOMY Left 02/21/2017   Procedure: XI ROBOTIC ASSISTED LAPAROSCOPIC NEPHRECTOMY;  Surgeon: Alexis Frock, MD;  Location: [facility];  Service: Urology;  Laterality: Left;     Current Outpatient Medications  Medication Sig Dispense Refill  . aspirin EC 81 MG tablet Take 81 mg by mouth daily.    Marland Kitchen atorvastatin (LIPITOR) 40 MG tablet Take 1 tablet (40 mg total) by mouth daily. 30 tablet 6  . carvedilol (COREG) 3.125 MG tablet Take 1 tablet (3.125 mg total) by mouth 2 (two) times daily. 180 tablet 2  . metFORMIN (GLUCOPHAGE) 1000 MG tablet Take 1 tablet (1,000 mg total) by mouth 2 (two) times daily.  0  . ondansetron (ZOFRAN ODT) 4 MG disintegrating tablet Take 1 tablet (4 mg total) by mouth every 8 (eight) hours as needed for nausea or vomiting. 20 tablet 0  . pantoprazole (PROTONIX) 40 MG tablet Take 1 tablet (40 mg total) by mouth at bedtime. 30 tablet 0  . prochlorperazine (COMPAZINE) 10 MG tablet Take 1 tablet (10 mg total) by mouth every 6 (six) hours as needed for nausea or vomiting. 30 tablet 0   No current facility-administered medications for this visit.     Allergies:   Patient has no known allergies.    Social History:  The patient  reports that he quit smoking about 5 years ago. He has a 50.00 pack-year smoking history. He has never used smokeless tobacco. He reports that he does not drink alcohol or use drugs.   Family History:  The patient's family history includes Cancer in his maternal grandfather and mother; Heart attack (age of onset: 62) in his brother; Stroke in his mother.    ROS:  Please see the history of present illness.   Otherwise, review of systems are positive for none.   All other systems are reviewed and negative.    PHYSICAL  EXAM: VS:  BP (!) 142/77   Pulse 83   Ht 5\' 11"  (1.803 m)   Wt 171 lb 3.2 oz (77.7 kg)   BMI 23.88 kg/m  , BMI Body mass index is 23.88 kg/m. GEN: Well nourished, well developed, in no acute distress  HEENT: normal  Neck: no JVD, or masses. Bilateral carotid bruits Cardiac: RRR; no murmurs, rubs, or gallops,no edema  Respiratory:  clear to auscultation bilaterally, normal work of breathing GI: soft, nontender, nondistended, + BS MS: no deformity or atrophy  Skin: warm and dry, no rash Neuro:  Strength and sensation are intact Psych: euthymic mood, full affect   EKG:  EKG is not ordered today.    Recent Labs: 11/22/2016: Magnesium 1.7 08/10/2017: ALT 18; BUN 22; Creatinine 1.39; Hemoglobin 9.9; Platelet Count 174; Potassium 4.7; Sodium 143; TSH 2.545    Lipid Panel  Component Value Date/Time   CHOL 77 11/20/2016 0704   TRIG 64 11/20/2016 0704   HDL 30 (L) 11/20/2016 0704   CHOLHDL 2.6 11/20/2016 0704   VLDL 13 11/20/2016 0704   LDLCALC 34 11/20/2016 0704      Wt Readings from Last 3 Encounters:  08/28/17 171 lb 3.2 oz (77.7 kg)  08/02/17 163 lb 12.8 oz (74.3 kg)  07/10/17 152 lb 12.8 oz (69.3 kg)         ASSESSMENT AND PLAN:   1.  Coronary artery disease: Status post CABG. Status post non-ST elevation myocardial infarction in August of 2017 in the setting of severe symptomatic anemia.  No anginal symptoms at the present time.  I recommend continuing medical therapy.  2. Ischemic cardiomyopathy: Most recent ejection fraction was 45-50%.  I increase carvedilol to 6.25 mg twice daily.  I am not going to add an ACE inhibitor or ARB for now given mild chronic kidney disease and previous acute kidney injury in the setting of chemotherapy treatment.   3. Peripheral arterial disease: The patient has evidence of severe right mid SFA stenosis .  The patient reports no claudication at the present time  4. Hyperlipidemia: Continue treatment with atorvastatin with a  target LDL of less than 70.  5. Moderate bilateral carotid disease: repeat carotid Doppler in October of this year.   Disposition: Follow-up with me in 6 months.  Signed,  [name], MD  08/28/2017 8:13 AM    [place]

## 2017-08-28 NOTE — Patient Instructions (Signed)
Medication Instructions: INCREASE the Carvedilol to 6.25 mg twice daily  If you need a refill on your cardiac medications before your next appointment, please call your pharmacy.   Procedures/Testing: Your physician has requested that you have a carotid duplex in October. This test is an ultrasound of the carotid arteries in your neck. It looks at blood flow through these arteries that supply the brain with blood. Allow one hour for this exam. There are no restrictions or special instructions. This will take place at [facility], [address].   Follow-Up: Your physician wants you to follow-up in 6 months with Dr. Fletcher Anon. You will receive a reminder letter in the mail two months in advance. If you don't receive a letter, please call our office at [phone] to schedule this follow-up appointment.   Thank you for choosing [facility] at [facility]!!

## 2017-09-06 ENCOUNTER — Inpatient Hospital Stay: Payer: Medicare Other | Attending: Internal Medicine

## 2017-09-06 ENCOUNTER — Telehealth: Payer: Self-pay | Admitting: Internal Medicine

## 2017-09-06 ENCOUNTER — Encounter: Payer: Self-pay | Admitting: Oncology

## 2017-09-06 ENCOUNTER — Inpatient Hospital Stay: Payer: Medicare Other | Admitting: Oncology

## 2017-09-06 ENCOUNTER — Inpatient Hospital Stay: Payer: Medicare Other

## 2017-09-06 VITALS — BP 138/79 | HR 88 | Temp 98.1°F | Resp 18 | Ht 71.0 in | Wt 174.8 lb

## 2017-09-06 DIAGNOSIS — E119 Type 2 diabetes mellitus without complications: Secondary | ICD-10-CM

## 2017-09-06 DIAGNOSIS — D649 Anemia, unspecified: Secondary | ICD-10-CM

## 2017-09-06 DIAGNOSIS — I252 Old myocardial infarction: Secondary | ICD-10-CM | POA: Diagnosis not present

## 2017-09-06 DIAGNOSIS — Z7984 Long term (current) use of oral hypoglycemic drugs: Secondary | ICD-10-CM

## 2017-09-06 DIAGNOSIS — I255 Ischemic cardiomyopathy: Secondary | ICD-10-CM | POA: Insufficient documentation

## 2017-09-06 DIAGNOSIS — M1990 Unspecified osteoarthritis, unspecified site: Secondary | ICD-10-CM

## 2017-09-06 DIAGNOSIS — E785 Hyperlipidemia, unspecified: Secondary | ICD-10-CM | POA: Insufficient documentation

## 2017-09-06 DIAGNOSIS — R197 Diarrhea, unspecified: Secondary | ICD-10-CM | POA: Insufficient documentation

## 2017-09-06 DIAGNOSIS — Z8673 Personal history of transient ischemic attack (TIA), and cerebral infarction without residual deficits: Secondary | ICD-10-CM | POA: Diagnosis not present

## 2017-09-06 DIAGNOSIS — R918 Other nonspecific abnormal finding of lung field: Secondary | ICD-10-CM

## 2017-09-06 DIAGNOSIS — Z7982 Long term (current) use of aspirin: Secondary | ICD-10-CM

## 2017-09-06 DIAGNOSIS — Z5112 Encounter for antineoplastic immunotherapy: Secondary | ICD-10-CM

## 2017-09-06 DIAGNOSIS — Z79899 Other long term (current) drug therapy: Secondary | ICD-10-CM | POA: Diagnosis not present

## 2017-09-06 DIAGNOSIS — C642 Malignant neoplasm of left kidney, except renal pelvis: Secondary | ICD-10-CM | POA: Diagnosis not present

## 2017-09-06 DIAGNOSIS — M129 Arthropathy, unspecified: Secondary | ICD-10-CM | POA: Insufficient documentation

## 2017-09-06 DIAGNOSIS — I2510 Atherosclerotic heart disease of native coronary artery without angina pectoris: Secondary | ICD-10-CM | POA: Diagnosis not present

## 2017-09-06 DIAGNOSIS — I739 Peripheral vascular disease, unspecified: Secondary | ICD-10-CM | POA: Diagnosis not present

## 2017-09-06 DIAGNOSIS — I5022 Chronic systolic (congestive) heart failure: Secondary | ICD-10-CM

## 2017-09-06 DIAGNOSIS — D500 Iron deficiency anemia secondary to blood loss (chronic): Secondary | ICD-10-CM

## 2017-09-06 LAB — CBC WITH DIFFERENTIAL (CANCER CENTER ONLY)
BASOS ABS: 0 10*3/uL (ref 0.0–0.1)
Basophils Relative: 0 %
EOS PCT: 7 %
Eosinophils Absolute: 0.7 10*3/uL — ABNORMAL HIGH (ref 0.0–0.5)
HCT: 32.4 % — ABNORMAL LOW (ref 38.4–49.9)
Hemoglobin: 10.5 g/dL — ABNORMAL LOW (ref 13.0–17.1)
LYMPHS ABS: 1.5 10*3/uL (ref 0.9–3.3)
LYMPHS PCT: 16 %
MCH: 31.7 pg (ref 27.2–33.4)
MCHC: 32.4 g/dL (ref 32.0–36.0)
MCV: 97.9 fL (ref 79.3–98.0)
MONO ABS: 1.1 10*3/uL — AB (ref 0.1–0.9)
Monocytes Relative: 12 %
Neutro Abs: 5.9 10*3/uL (ref 1.5–6.5)
Neutrophils Relative %: 65 %
PLATELETS: 187 10*3/uL (ref 140–400)
RBC: 3.31 MIL/uL — ABNORMAL LOW (ref 4.20–5.82)
RDW: 15.5 % — AB (ref 11.0–14.6)
WBC Count: 9.2 10*3/uL (ref 4.0–10.3)

## 2017-09-06 LAB — CMP (CANCER CENTER ONLY)
ALBUMIN: 4 g/dL (ref 3.5–5.0)
AST: 12 U/L (ref 5–34)
Alkaline Phosphatase: 96 U/L (ref 40–150)
Anion gap: 7 (ref 3–11)
BILIRUBIN TOTAL: 0.4 mg/dL (ref 0.2–1.2)
BUN: 21 mg/dL (ref 7–26)
CO2: 24 mmol/L (ref 22–29)
CREATININE: 1.73 mg/dL — AB (ref 0.70–1.30)
Calcium: 9.8 mg/dL (ref 8.4–10.4)
Chloride: 107 mmol/L (ref 98–109)
GFR, Est AFR Am: 44 mL/min — ABNORMAL LOW (ref >60)
GFR, Estimated: 38 mL/min — ABNORMAL LOW (ref >60)
GLUCOSE: 72 mg/dL (ref 70–140)
POTASSIUM: 5.9 mmol/L — AB (ref 3.5–5.1)
Sodium: 138 mmol/L (ref 136–145)
Total Protein: 6.9 g/dL (ref 6.4–8.3)

## 2017-09-06 LAB — TSH: TSH: 2.321 u[IU]/mL (ref 0.320–4.118)

## 2017-09-06 MED ORDER — SODIUM CHLORIDE 0.9 % IV SOLN
480.0000 mg | Freq: Once | INTRAVENOUS | Status: AC
  Administered 2017-09-06: 480 mg via INTRAVENOUS
  Filled 2017-09-06: qty 48

## 2017-09-06 MED ORDER — SODIUM CHLORIDE 0.9 % IV SOLN: Freq: Once | INTRAVENOUS | Status: DC

## 2017-09-06 NOTE — Progress Notes (Signed)
[facility] OFFICE PROGRESS NOTE  Via, [name], MD [address] [place] [address]  DIAGNOSIS: [name].The patient presented with an11 cm left kidney mass and pulmonary nodules.  PRIOR THERAPY: 1)Status post left nephrectomy on 02/21/2017. 2)Cabometyx 60 mg daily. Started 01/21/2017.Status post 1 month of treatment. Treatment was then placed on hold for nephrectomy. Resuming treatment on 03/22/2017.Status post 3 weeks of treatment. The patient was advised to hold Cabometyxstarting on 04/11/2017. This dose will be changed to 40 mg p.o. Daily.Started 05/12/2017. Status post 52monthsof treatment. Patient stopped secondary to intolerance.  CURRENT THERAPY: Nivolumab 480 mg IV given every 4 weeks.  First dose given on 08/10/2017.  Status post 1 cycle.  INTERVAL HISTORY: [name] 72 y.o. male returns for routine follow-up visit by himself.  Patient is feeling fine and has no specific complaints except for decreased stamina.  He tolerated his first cycle of immunotherapy fairly well.  He is getting back some of his lost weight.  His appetite is good.  He denies fevers and chills.  Denies chest pain, shortness of breath, cough, hemoptysis.  Denies nausea, vomiting, constipation, diarrhea.  The patient is here for evaluation prior to cycle #2 of his treatment.  MEDICAL HISTORY: Past Medical History:  Diagnosis Date  . 3-vessel coronary artery disease   . Anemia    2 iron infusions  . Arthritis   . Carotid disease, bilateral (HCC)    moderate  . Chronic systolic heart failure (HCC)    EF 45%  . Coronary artery disease    06/2012:STEMI s/p CABG; 8/18 NSTEMI  . Diabetes mellitus without complication ([place])   . Diarrhea   . Dyspnea    mild  . History of blood transfusion   . Hyperlipidemia   . Ischemic cardiomyopathy    Ejection fraction of 35-40% initially, 45 - 50% on echo 2014, 53% by perfusion study 2016  . Mild mitral  regurgitation   . Numbness    Right side since stroke  . OA (osteoarthritis)   . PAD (peripheral artery disease) ([place])   . Pulmonary nodule   . Renal cell cancer, left ([place]) 12/2016   Associated w/ pulmonary nodules  . Stroke ([place]) 2018  . TIA (transient ischemic attack) 10/2016  . Weakness generalized   . Wears glasses     ALLERGIES:  has No Known Allergies.  MEDICATIONS:  Current Outpatient Medications  Medication Sig Dispense Refill  . aspirin EC 81 MG tablet Take 81 mg by mouth daily.    Marland Kitchen atorvastatin (LIPITOR) 40 MG tablet Take 1 tablet (40 mg total) by mouth daily. 30 tablet 6  . carvedilol (COREG) 6.25 MG tablet Take 1 tablet (6.25 mg total) by mouth 2 (two) times daily. 180 tablet 1  . metFORMIN (GLUCOPHAGE) 1000 MG tablet Take 1 tablet (1,000 mg total) by mouth 2 (two) times daily.  0  . ondansetron (ZOFRAN ODT) 4 MG disintegrating tablet Take 1 tablet (4 mg total) by mouth every 8 (eight) hours as needed for nausea or vomiting. 20 tablet 0  . pantoprazole (PROTONIX) 40 MG tablet Take 1 tablet (40 mg total) by mouth at bedtime. 30 tablet 0  . prochlorperazine (COMPAZINE) 10 MG tablet Take 1 tablet (10 mg total) by mouth every 6 (six) hours as needed for nausea or vomiting. 30 tablet 0   No current facility-administered medications for this visit.     SURGICAL HISTORY:  Past Surgical History:  Procedure Laterality Date  .  CARDIOVASCULAR STRESS TEST    . CORONARY ARTERY BYPASS GRAFT N/A 07/05/2012   Procedure: CORONARY ARTERY BYPASS GRAFTING (CABG);  Surgeon: Ivin Poot, MD;  Location: [facility];  Service: Open Heart Surgery;  Laterality: N/A;  . INTRAOPERATIVE TRANSESOPHAGEAL ECHOCARDIOGRAM N/A 07/05/2012   Procedure: INTRAOPERATIVE TRANSESOPHAGEAL ECHOCARDIOGRAM;  Surgeon: Ivin Poot, MD;  Location: [facility];  Service: Open Heart Surgery;  Laterality: N/A;  . LEFT HEART CATH AND CORS/GRAFTS ANGIOGRAPHY N/A 11/14/2016   Procedure: LEFT HEART CATH AND CORS/GRAFTS  ANGIOGRAPHY;  Surgeon: Belva Crome, MD;  Location: [facility] CV LAB;  Service: Cardiovascular;  Laterality: N/A;  . LEFT HEART CATHETERIZATION WITH CORONARY ANGIOGRAM N/A 07/04/2012   Procedure: LEFT HEART CATHETERIZATION WITH CORONARY ANGIOGRAM;  Surgeon: Wellington Hampshire, MD;  Location: [facility] CATH [facility];  Service: Cardiovascular;  Laterality: N/A;  . RENAL BIOPSY    . ROBOT ASSISTED LAPAROSCOPIC NEPHRECTOMY Left 02/21/2017   Procedure: XI ROBOTIC ASSISTED LAPAROSCOPIC NEPHRECTOMY;  Surgeon: Alexis Frock, MD;  Location: [facility];  Service: Urology;  Laterality: Left;    REVIEW OF SYSTEMS:   Review of Systems  Constitutional: Negative for appetite change, chills, fever and unexpected weight change. Positive for fatigue. HENT:   Negative for mouth sores, nosebleeds, sore throat and trouble swallowing.   Eyes: Negative for eye problems and icterus.  Respiratory: Negative for cough, hemoptysis, shortness of breath and wheezing.   Cardiovascular: Negative for chest pain and leg swelling.  Gastrointestinal: Negative for abdominal pain, constipation, diarrhea, nausea and vomiting.  Genitourinary: Negative for bladder incontinence, difficulty urinating, dysuria, frequency and hematuria.   Musculoskeletal: Negative for back pain, gait problem, neck pain and neck stiffness.  Skin: Negative for itching and rash.  Neurological: Negative for dizziness, extremity weakness, gait problem, headaches, light-headedness and seizures.  Hematological: Negative for adenopathy. Does not bruise/bleed easily.  Psychiatric/Behavioral: Negative for confusion, depression and sleep disturbance. The patient is not nervous/anxious.     PHYSICAL EXAMINATION:  Blood pressure 138/79, pulse 88, temperature 98.1 F (36.7 C), temperature source Oral, resp. rate 18, height 5\' 11"  (1.803 m), weight 174 lb 12.8 oz (79.3 kg), SpO2 100 %.  ECOG PERFORMANCE STATUS: 1 - Symptomatic but completely ambulatory  Physical Exam   Constitutional: Oriented to person, place, and time and well-developed, well-nourished, and in no distress. No distress.  HENT:  Head: Normocephalic and atraumatic.  Mouth/Throat: Oropharynx is clear and moist. No oropharyngeal exudate.  Eyes: Conjunctivae are normal. Right eye exhibits no discharge. Left eye exhibits no discharge. No scleral icterus.  Neck: Normal range of motion. Neck supple.  Cardiovascular: Normal rate, regular rhythm, normal heart sounds and intact distal pulses.   Pulmonary/Chest: Effort normal and breath sounds normal. No respiratory distress. No wheezes. No rales.  Abdominal: Soft. Bowel sounds are normal. Exhibits no distension and no mass. There is no tenderness.  Musculoskeletal: Normal range of motion. Exhibits no edema.  Lymphadenopathy:    No cervical adenopathy.  Neurological: Alert and oriented to person, place, and time. Exhibits normal muscle tone. Gait normal. Coordination normal.  Skin: Skin is warm and dry. No rash noted. Not diaphoretic. No erythema. No pallor.  Psychiatric: Mood, memory and judgment normal.  Vitals reviewed.  LABORATORY DATA: Lab Results  Component Value Date   WBC 9.2 09/06/2017   HGB 10.5 (L) 09/06/2017   HCT 32.4 (L) 09/06/2017   MCV 97.9 09/06/2017   PLT 187 09/06/2017      Chemistry      Component Value Date/Time  NA 143 08/10/2017 0824   NA 141 03/22/2017 0919   K 4.7 08/10/2017 0824   K 5.5 (H) 03/22/2017 0919   CL 109 08/10/2017 0824   CO2 24 08/10/2017 0824   CO2 27 03/22/2017 0919   BUN 22 08/10/2017 0824   BUN 18.1 03/22/2017 0919   CREATININE 1.39 (H) 08/10/2017 0824   CREATININE 1.2 03/22/2017 0919      Component Value Date/Time   CALCIUM 9.2 08/10/2017 0824   CALCIUM 9.8 03/22/2017 0919   ALKPHOS 72 08/10/2017 0824   ALKPHOS 103 03/22/2017 0919   AST 22 08/10/2017 0824   AST 15 03/22/2017 0919   ALT 18 08/10/2017 0824   ALT 13 03/22/2017 0919   BILITOT 0.3 08/10/2017 0824   BILITOT 0.41  03/22/2017 0919       RADIOGRAPHIC STUDIES:  No results found.   ASSESSMENT/PLAN:  Renal cell carcinoma [facility]) This is a very pleasant 72 year old white male diagnosed with metastatic renal cell carcinoma, chromophobe type presented with large left kidney mass as well as multiple bilateral pulmonary nodules. He is status post left nephrectomy on February 21, 2017. The patientreceivedtreatment with Cabometyx initially at a dose of 60 mg p.o. daily but he has a rough time tolerating this treatment with several episodes of diarrhea as well as nausea. His treatment was switched toCabometyx at a dose of 40 mg p.o. dailystatus post54months.  Treatment was stopped secondary to intolerance. The patient felt much better off Cabometyx.   The patient is now on Nivolumab 480 mg every 4 weeks.  Status post 1 cycle.  He tolerated the first cycle well with no concerning complaints.    The patient was seen with Dr. Julien Nordmann.  Recommend for him to proceed with cycle 2 of his treatment as scheduled today.   He will follow-up in 4 weeks for evaluation prior to cycle #3.  His anemia has improved with ferrous sulfate twice a day.  He was encouraged to continue taking this.  We will continue to monitor his hemoglobin.  The patient was advised to call immediately if he has any concerning symptoms in the interval. The patient voices understanding of current disease status and treatment options and is in agreement with the current care plan. All questions were answered. The patient knows to call the clinic with any problems, questions or concerns. We can certainly see the patient much sooner if necessary.   No orders of the defined types were placed in this encounter.  Mikey Bussing, DNP, AGPCNP-BC, AOCNP 09/06/17  ADDENDUM: Hematology/Oncology Attending: I had a face-to-face encounter with the patient today.  I recommended his care plan.  This is a very pleasant 72 years old white male with metastatic  renal cell carcinoma.  He was started initially on treatment with Cabometyx but could not tolerate this treatment.  The patient wants to switch to treatment with Nivolumab 480 mg IV every 4 weeks status post 1 cycle.  He tolerated the first cycle of his treatment well with no concerning complaints.  He denied having any chest pain, shortness breath, cough or hemoptysis.  He is actually gaining weight and feeling well. I recommended for the patient to proceed with cycle #2 today as scheduled. I will see him back for follow-up visit in 4 weeks for evaluation before starting cycle #3. He was advised to call immediately if he has any concerning symptoms in the interval.  Disclaimer: This note was dictated with voice recognition software. Similar sounding words can inadvertently  be transcribed and may be missed upon review. Eilleen Kempf, MD 09/06/17

## 2017-09-06 NOTE — Patient Instructions (Signed)
[facility] Discharge Instructions for Patients Receiving Chemotherapy  Today you received the following chemotherapy agents: Nivolumab  To help prevent nausea and vomiting after your treatment, we encourage you to take your nausea medication as directed.    If you develop nausea and vomiting that is not controlled by your nausea medication, call the clinic.   BELOW ARE SYMPTOMS THAT SHOULD BE REPORTED IMMEDIATELY:  *FEVER GREATER THAN 100.5 F  *CHILLS WITH OR WITHOUT FEVER  NAUSEA AND VOMITING THAT IS NOT CONTROLLED WITH YOUR NAUSEA MEDICATION  *UNUSUAL SHORTNESS OF BREATH  *UNUSUAL BRUISING OR BLEEDING  TENDERNESS IN MOUTH AND THROAT WITH OR WITHOUT PRESENCE OF ULCERS  *URINARY PROBLEMS  *BOWEL PROBLEMS  UNUSUAL RASH Items with * indicate a potential emergency and should be followed up as soon as possible.  Feel free to call the clinic should you have any questions or concerns. The clinic phone number is [phone].  Please show the CHEMO ALERT CARD at check-in to the Emergency Department and triage nurse.   

## 2017-09-06 NOTE — Telephone Encounter (Signed)
Scheduled appt per 6/20 los - pt to get an updated schedule in the treatment area.  

## 2017-09-06 NOTE — Assessment & Plan Note (Addendum)
This is a very pleasant 72 year old white male diagnosed with metastatic renal cell carcinoma, chromophobe type presented with large left kidney mass as well as multiple bilateral pulmonary nodules. He is status post left nephrectomy on February 21, 2017. The patientreceivedtreatment with Cabometyx initially at a dose of 60 mg p.o. daily but he has a rough time tolerating this treatment with several episodes of diarrhea as well as nausea. His treatment was switched toCabometyx at a dose of 40 mg p.o. dailystatus post52months.  Treatment was stopped secondary to intolerance. The patient felt much better off Cabometyx.   The patient is now on Nivolumab 480 mg every 4 weeks.  Status post 1 cycle.  He tolerated the first cycle well with no concerning complaints.    The patient was seen with Dr. Julien Nordmann.  Recommend for him to proceed with cycle 2 of his treatment as scheduled today.   He will follow-up in 4 weeks for evaluation prior to cycle #3.  His anemia has improved with ferrous sulfate twice a day.  He was encouraged to continue taking this.  We will continue to monitor his hemoglobin.  The patient was advised to call immediately if he has any concerning symptoms in the interval. The patient voices understanding of current disease status and treatment options and is in agreement with the current care plan. All questions were answered. The patient knows to call the clinic with any problems, questions or concerns. We can certainly see the patient much sooner if necessary.

## 2017-09-06 NOTE — Progress Notes (Signed)
Okay to treat with with creatinine 1.73 per Mikey Bussing NP.

## 2017-09-10 IMAGING — CT CT HEAD W/O CM
3 series · 15 of 47 positions shown, 18 images · non-contrast
Comparison: None.

CLINICAL DATA: 71 y/o  M; code stroke

EXAM:
CT HEAD WITHOUT CONTRAST
TECHNIQUE: Contiguous axial images were obtained from the base of the skull
through the vertex without intravenous contrast.

[Series 3: head 5.0 st · axial · 0.47mm/px · z∈[-119,+21]mm · 9 of 34 slices shown, 12 images]
[im 3/34  brain]
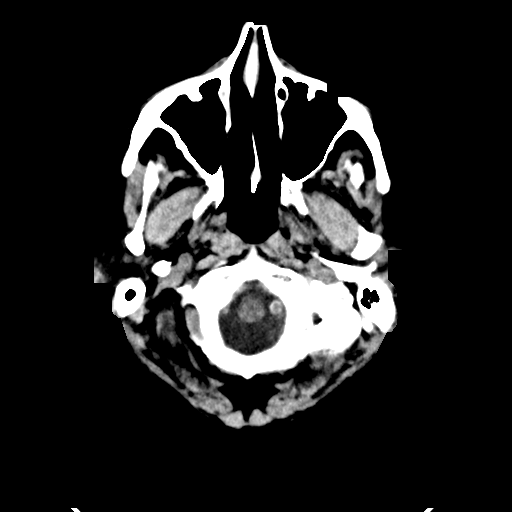
[im 3/34  bone]
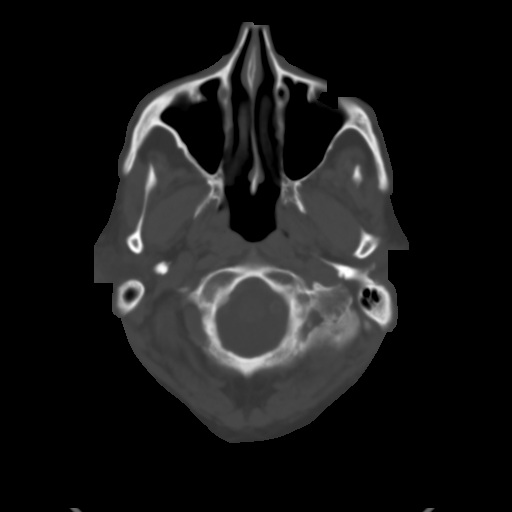
[im 6/34  brain]
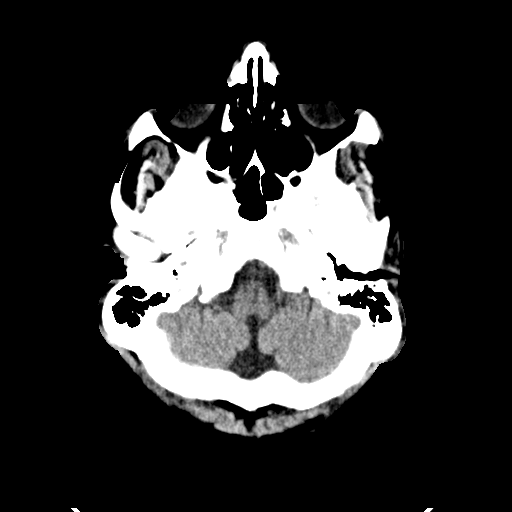
[im 10/34  brain]
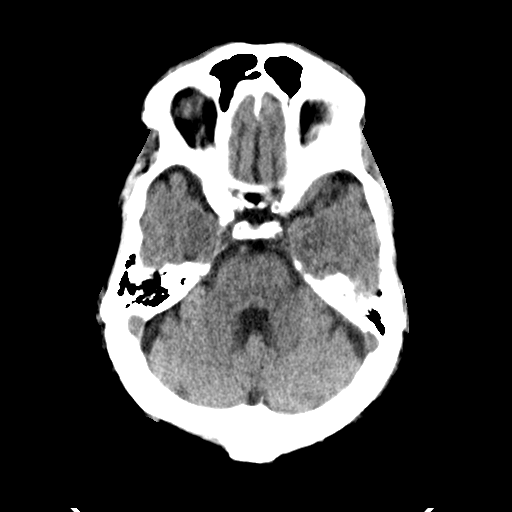
[im 13/34  brain]
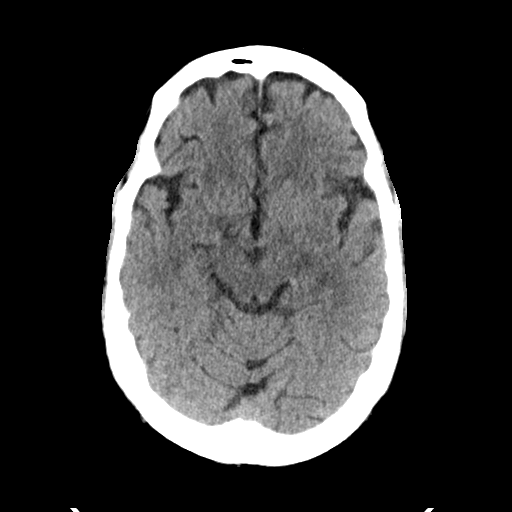
[im 18/34  brain]
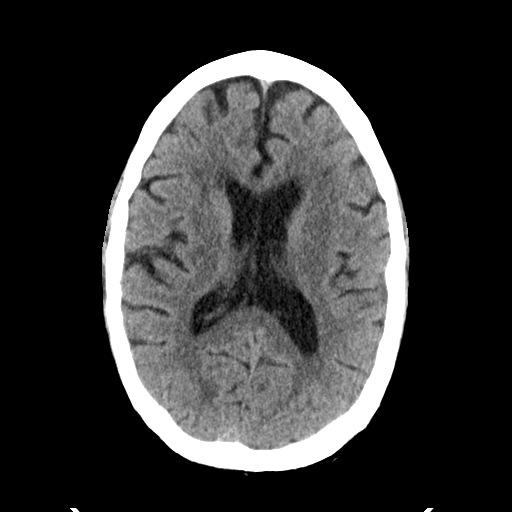
[im 18/34  bone]
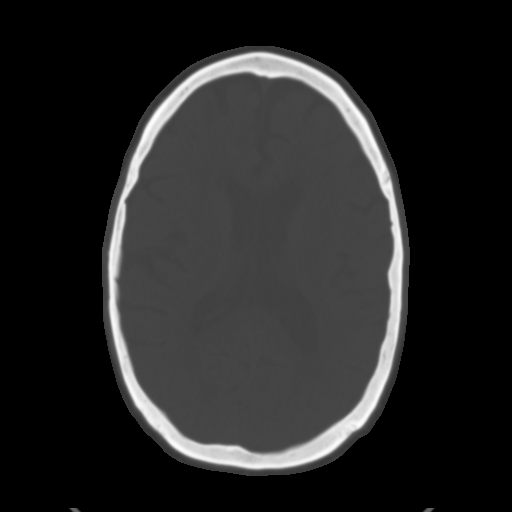
[im 21/34  brain]
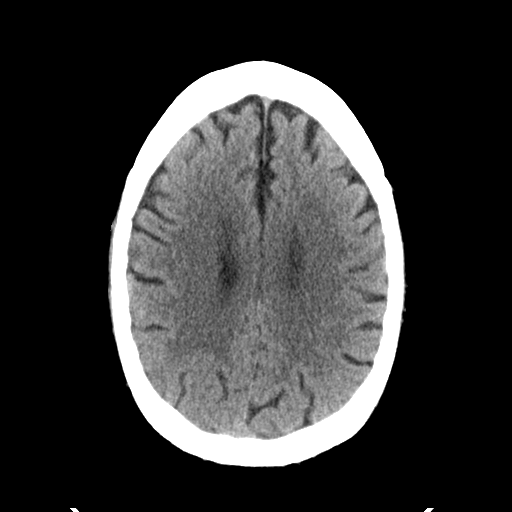
[im 24/34  brain]
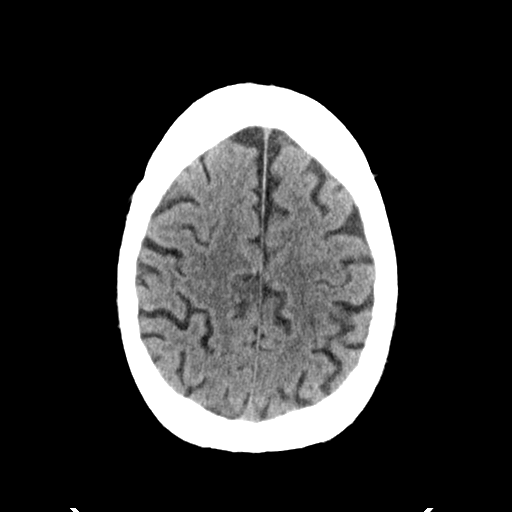
[im 28/34  brain]
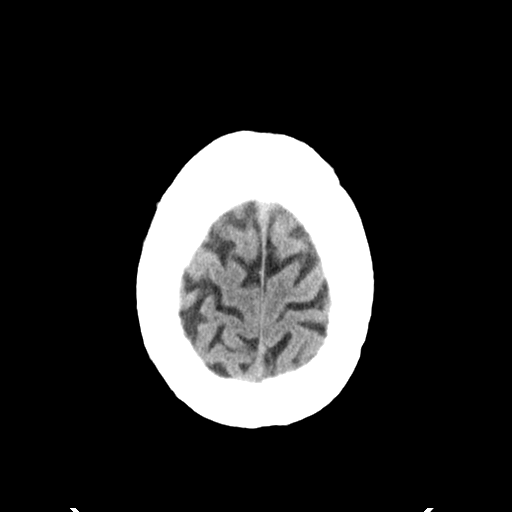
[im 31/34  brain]
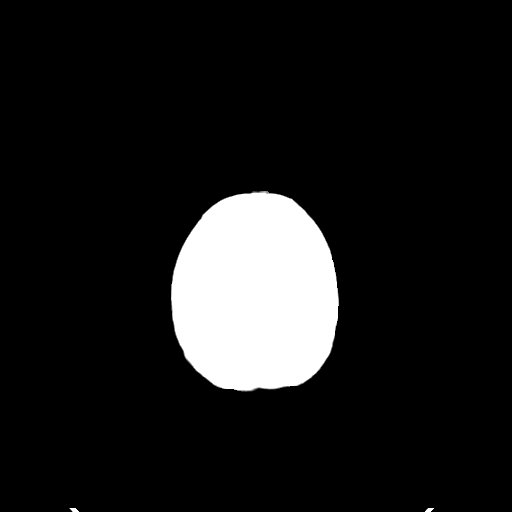
[im 31/34  bone]
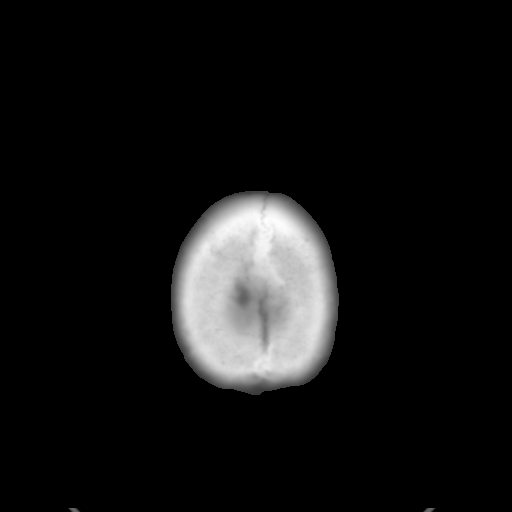

[Series 5: head 3.0 cor st · coronal · 0.32mm/px · 3 of 73 slices shown]
[im 25/73  brain]
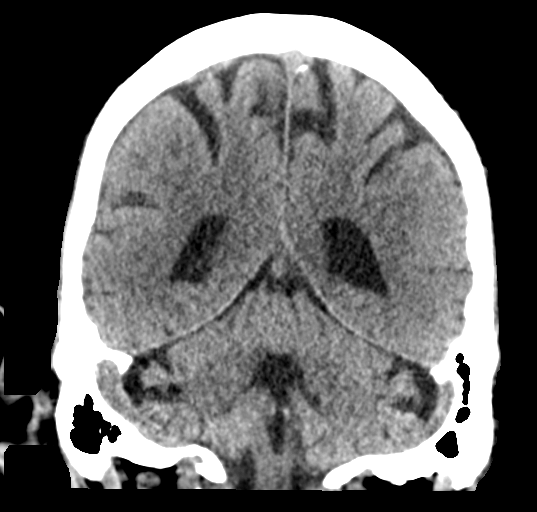
[im 33/73  brain]
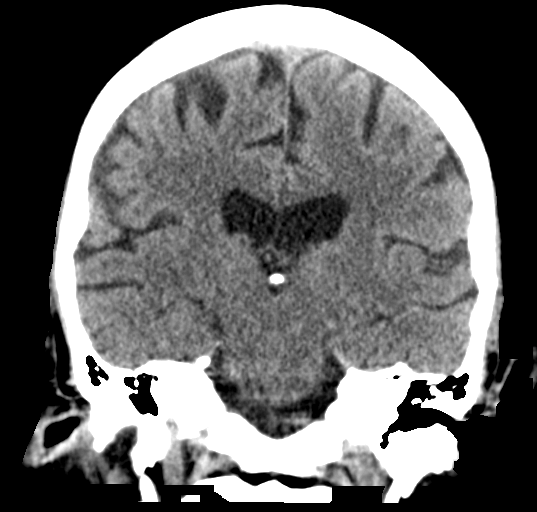
[im 41/73  brain]
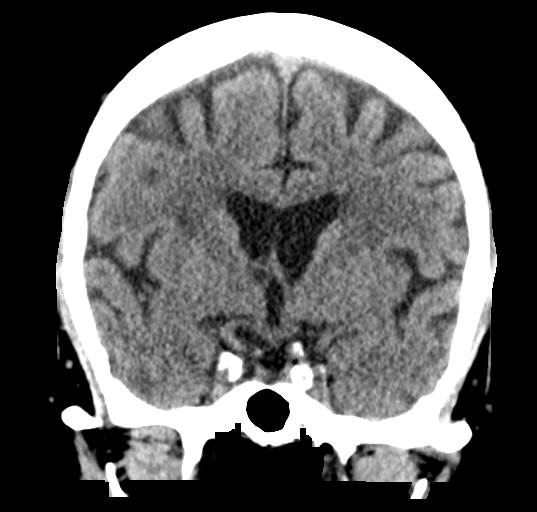

[Series 6: head 3.0 sag st · sagittal · 0.32mm/px · 3 of 60 slices shown]
[im 20/60  brain]
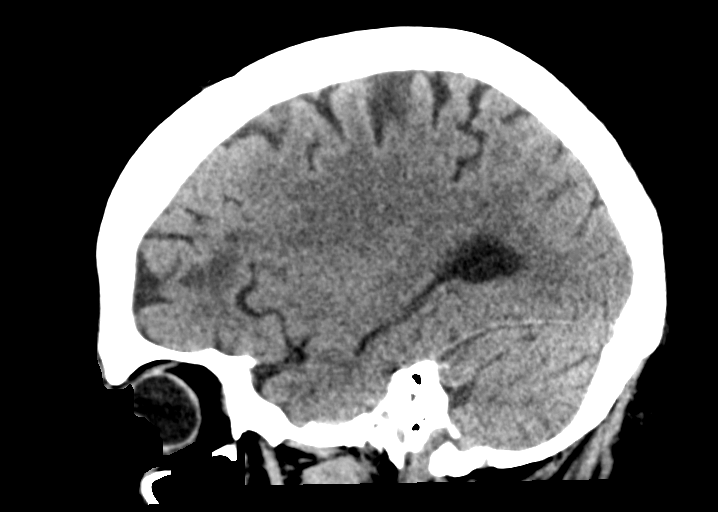
[im 30/60  brain]
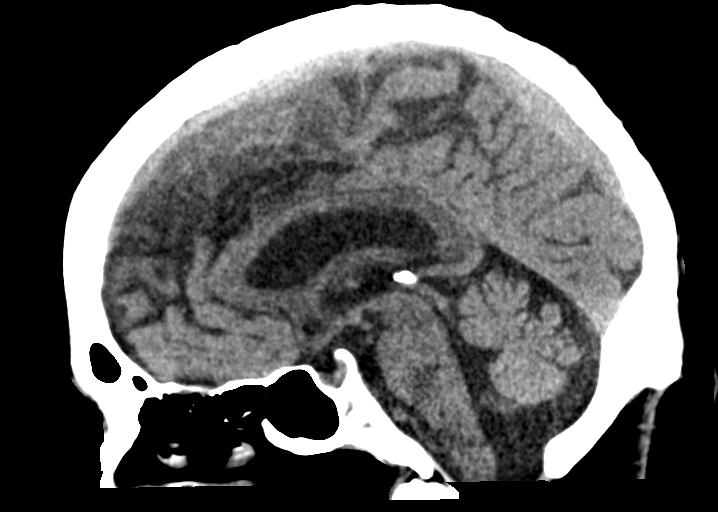
[im 40/60  brain]
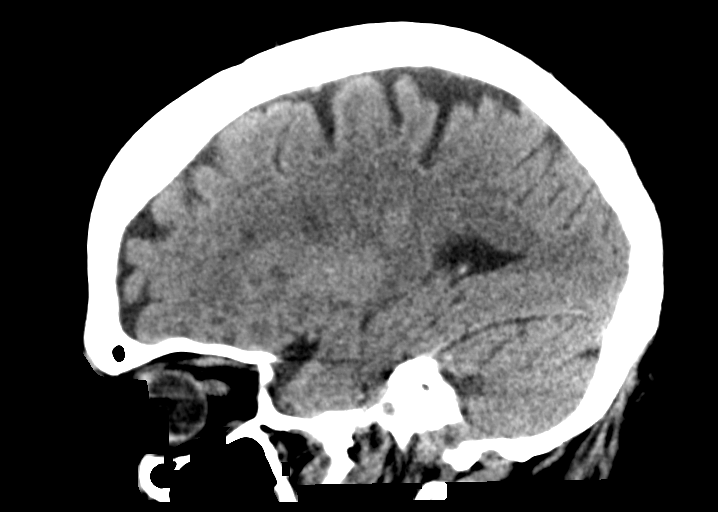

[15 of 47 positions shown; findings below may reference images not displayed]

FINDINGS: Brain: No large acute stroke, intracranial hemorrhage, or focal mass
effect. Mild chronic microvascular ischemic changes of white matter.
Small lucencies in right anterior corona radiata and right posterior
limb of internal capsule are compatible with age indeterminate
chronic lacunar infarctions. Small chronic appearing infarct in left
frontal subcortical white matter.

Vascular: Moderate calcific atherosclerosis of the siphons. No
hyperdense vessel identified

Skull: Normal. Negative for fracture or focal lesion.

Sinuses/Orbits: Small right-sided maxillary mucous retention cyst.
Mild frontal sinus mucosal thickening. Normal aeration of mastoid
air cells. Normal orbits

Other: None.

ASPECTS (Alberta Stroke Program Early CT Score)

- Ganglionic level infarction (caudate, lentiform nuclei, internal
capsule, insula, M1-M3 cortex): 7

- Supraganglionic infarction (M4-M6 cortex): 3

Total score (0-10 with 10 being normal): 10
IMPRESSION: 1. Age-indeterminate lacunar infarcts in right corona radiata and
right posterior limb of internal capsule.
2. No large acute vascular territory infarct, intracranial
hemorrhage, or focal mass effect.
3. ASPECTS is 10
These results were called by telephone at the time of interpretation
on 11/20/2016 at [DATE] to Dr. TESHAY DUNGCA , who verbally
acknowledged these results.

By: Irvin Abdiel Suzeth M.D.

## 2017-10-03 ENCOUNTER — Other Ambulatory Visit: Payer: Self-pay

## 2017-10-03 ENCOUNTER — Encounter (HOSPITAL_COMMUNITY): Payer: Self-pay

## 2017-10-03 ENCOUNTER — Emergency Department (HOSPITAL_COMMUNITY): Payer: Medicare Other

## 2017-10-03 ENCOUNTER — Emergency Department (HOSPITAL_COMMUNITY)
Admission: EM | Admit: 2017-10-03 | Discharge: 2017-10-03 | Disposition: A | Discharge status: Home / Self Care | Payer: Medicare Other | Attending: Emergency Medicine | Admitting: Emergency Medicine

## 2017-10-03 DIAGNOSIS — Z87891 Personal history of nicotine dependence: Secondary | ICD-10-CM | POA: Insufficient documentation

## 2017-10-03 DIAGNOSIS — Z7984 Long term (current) use of oral hypoglycemic drugs: Secondary | ICD-10-CM | POA: Insufficient documentation

## 2017-10-03 DIAGNOSIS — Z951 Presence of aortocoronary bypass graft: Secondary | ICD-10-CM | POA: Insufficient documentation

## 2017-10-03 DIAGNOSIS — Z79899 Other long term (current) drug therapy: Secondary | ICD-10-CM | POA: Insufficient documentation

## 2017-10-03 DIAGNOSIS — Z85528 Personal history of other malignant neoplasm of kidney: Secondary | ICD-10-CM | POA: Diagnosis not present

## 2017-10-03 DIAGNOSIS — E860 Dehydration: Secondary | ICD-10-CM | POA: Diagnosis not present

## 2017-10-03 DIAGNOSIS — I5023 Acute on chronic systolic (congestive) heart failure: Secondary | ICD-10-CM | POA: Insufficient documentation

## 2017-10-03 DIAGNOSIS — E119 Type 2 diabetes mellitus without complications: Secondary | ICD-10-CM | POA: Diagnosis not present

## 2017-10-03 DIAGNOSIS — R42 Dizziness and giddiness: Secondary | ICD-10-CM | POA: Diagnosis present

## 2017-10-03 LAB — CBC WITH DIFFERENTIAL/PLATELET
BASOS PCT: 0 %
Basophils Absolute: 0 10*3/uL (ref 0.0–0.1)
Eosinophils Absolute: 0.5 10*3/uL (ref 0.0–0.7)
Eosinophils Relative: 6 %
HEMATOCRIT: 33.3 % — AB (ref 39.0–52.0)
HEMOGLOBIN: 10.8 g/dL — AB (ref 13.0–17.0)
Lymphocytes Relative: 16 %
Lymphs Abs: 1.3 10*3/uL (ref 0.7–4.0)
MCH: 31.6 pg (ref 26.0–34.0)
MCHC: 32.4 g/dL (ref 30.0–36.0)
MCV: 97.4 fL (ref 78.0–100.0)
MONOS PCT: 9 %
Monocytes Absolute: 0.7 10*3/uL (ref 0.1–1.0)
NEUTROS ABS: 5.6 10*3/uL (ref 1.7–7.7)
NEUTROS PCT: 69 %
Platelets: 192 10*3/uL (ref 150–400)
RBC: 3.42 MIL/uL — ABNORMAL LOW (ref 4.22–5.81)
RDW: 13.6 % (ref 11.5–15.5)
WBC: 8.2 10*3/uL (ref 4.0–10.5)

## 2017-10-03 LAB — CBG MONITORING, ED: Glucose-Capillary: 102 mg/dL — ABNORMAL HIGH (ref 70–99)

## 2017-10-03 LAB — COMPREHENSIVE METABOLIC PANEL
ALBUMIN: 4.4 g/dL (ref 3.5–5.0)
ALK PHOS: 84 U/L (ref 38–126)
ALT: 13 U/L (ref 0–44)
AST: 17 U/L (ref 15–41)
Anion gap: 9 (ref 5–15)
BILIRUBIN TOTAL: 1 mg/dL (ref 0.3–1.2)
BUN: 35 mg/dL — AB (ref 8–23)
CO2: 24 mmol/L (ref 22–32)
CREATININE: 1.91 mg/dL — AB (ref 0.61–1.24)
Calcium: 9.8 mg/dL (ref 8.9–10.3)
Chloride: 111 mmol/L (ref 98–111)
GFR calc Af Amer: 39 mL/min — ABNORMAL LOW (ref >60)
GFR calc non Af Amer: 34 mL/min — ABNORMAL LOW (ref >60)
GLUCOSE: 103 mg/dL — AB (ref 70–99)
Potassium: 5.3 mmol/L — ABNORMAL HIGH (ref 3.5–5.1)
Sodium: 144 mmol/L (ref 135–145)
TOTAL PROTEIN: 7.3 g/dL (ref 6.5–8.1)

## 2017-10-03 LAB — PROTIME-INR
INR: 1.04
Prothrombin Time: 13.5 seconds (ref 11.4–15.2)

## 2017-10-03 MED ORDER — SODIUM CHLORIDE 0.9 % IV SOLN: INTRAVENOUS | Status: DC

## 2017-10-03 MED ORDER — SODIUM CHLORIDE 0.9 % IV SOLN: 1000.0000 mL | INTRAVENOUS | Status: DC

## 2017-10-03 MED ORDER — SODIUM CHLORIDE 0.9 % IV BOLUS
500.0000 mL | Freq: Once | INTRAVENOUS | Status: AC
  Administered 2017-10-03: 500 mL via INTRAVENOUS

## 2017-10-03 MED ORDER — SODIUM CHLORIDE 0.9 % IV BOLUS (SEPSIS)
500.0000 mL | Freq: Once | INTRAVENOUS | Status: AC
  Administered 2017-10-03: 500 mL via INTRAVENOUS

## 2017-10-03 NOTE — ED Triage Notes (Signed)
Patient with history of kidney cancer, receiving infusion treatments, presents with complaints of dizziness, especially when standing. Patient reports he has felt this way before and was concerned he was having another stroke, but later found out his blood sugar was low. Patient alert and oriented in triage. Patient denies CP/SOB/fever/cough/melena

## 2017-10-03 NOTE — Discharge Instructions (Addendum)
Continue to drink plenty of fluids, follow-up with your primary care doctor next week, sooner if you have any recurrent symptoms or return to the emergency room for further evaluation

## 2017-10-03 NOTE — ED Notes (Signed)
PT was walked down the hall and back, pt wanted to go for a longer walk to ensure that his dizziness had went away and he states that he felt better then when he came in... Which makes things a lot better for him ... Pt also walked at a great steady pace.

## 2017-10-03 NOTE — ED Notes (Signed)
Dr. Tomi Bamberger aware of Trevor [name]'s d/c HR 39.  Trevor [name] reports he has an appt with cancer ctr tomorrow

## 2017-10-03 NOTE — ED Provider Notes (Signed)
[facility] DEPT Provider Note   CSN: [id number] Arrival date & time: 10/03/17  1422     History   Chief Complaint Chief Complaint  Patient presents with  . Dizziness    HPI Trevor [name] is a 72 y.o. male.  HPI Pt has been having trouble with dizziness the last few days.  He notices it mostly when he first gets up.  It tends to get better as he gets moving.   It has progressed over the last few days and today it has been constant.  He feels lightheaded as if he is going to pass out.  However it also gets worse with moving his head. He has to hold on to something and pause. No room spinning.  No blood in his stool.  No sweating.   No headache or chest pain.  No fever or cough.  No shortness of breath.  Pt is being treated for renal cell cancer.  His last treatment was June 20th.  He is do this coming weak.   Past Medical History:  Diagnosis Date  . 3-vessel coronary artery disease   . Anemia    2 iron infusions  . Arthritis   . Carotid disease, bilateral (HCC)    moderate  . Chronic systolic heart failure (HCC)    EF 45%  . Coronary artery disease    06/2012:STEMI s/p CABG; 8/18 NSTEMI  . Diabetes mellitus without complication ([place])   . Diarrhea   . Dyspnea    mild  . History of blood transfusion   . Hyperlipidemia   . Ischemic cardiomyopathy    Ejection fraction of 35-40% initially, 45 - 50% on echo 2014, 53% by perfusion study 2016  . Mild mitral regurgitation   . Numbness    Right side since stroke  . OA (osteoarthritis)   . PAD (peripheral artery disease) ([place])   . Pulmonary nodule   . Renal cell cancer, left ([place]) 12/2016   Associated w/ pulmonary nodules  . Stroke ([place]) 2018  . TIA (transient ischemic attack) 10/2016  . Weakness generalized   . Wears glasses     Patient Active Problem List   Diagnosis Date Noted  . Encounter for antineoplastic immunotherapy 09/06/2017  . Goals of care, counseling/discussion 07/04/2017  .  Malnutrition of moderate degree 04/19/2017  . Colitis 04/19/2017  . Acute kidney injury ([place]) 04/17/2017  . History of CVA (cerebrovascular accident) 04/17/2017  . Severe diarrhea 04/17/2017  . 3-vessel coronary artery disease 04/17/2017  . Chronic combined systolic and diastolic heart failure ([place]) 04/17/2017  . Diabetes mellitus type 2 in nonobese ([place]) 04/17/2017  . Chronic systolic heart failure ([place])   . Dehydration 04/11/2017  . Nausea without vomiting 04/11/2017  . Elevated LFTs 04/11/2017  . Encounter for antineoplastic chemotherapy 02/05/2017  . Renal cell cancer ([place]) 01/18/2017  . Renal cell carcinoma ([place]) 01/15/2017  . Renal cell cancer, left ([place]) 12/18/2016  . Renal mass 12/06/2016  . 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 NSTEMI (non-ST elevated myocardial infarction) ([place])   . Anemia 11/11/2016  . TIA (transient ischemic attack) 10/18/2016  . Iron deficiency anemia due to chronic blood loss 09/20/2015  . Recent weight loss 09/20/2015  . Bilateral leg pain 02/21/2015  . Coronary artery disease   . Ischemic cardiomyopathy   . Hyperlipidemia   .  ST elevation myocardial infarction (STEMI) of anterior wall, initial episode of care ([place]) 07/04/2012  . Hyperglycemia 07/04/2012    Past Surgical History:  Procedure Laterality Date  . CARDIOVASCULAR STRESS TEST    . CORONARY ARTERY BYPASS GRAFT N/A 07/05/2012   Procedure: CORONARY ARTERY BYPASS GRAFTING (CABG);  Surgeon: Ivin Poot, MD;  Location: [facility];  Service: Open Heart Surgery;  Laterality: N/A;  . INTRAOPERATIVE TRANSESOPHAGEAL ECHOCARDIOGRAM N/A 07/05/2012   Procedure: INTRAOPERATIVE TRANSESOPHAGEAL ECHOCARDIOGRAM;  Surgeon: Ivin Poot, MD;  Location: [facility];  Service: Open Heart Surgery;  Laterality: N/A;  . LEFT HEART CATH AND  CORS/GRAFTS ANGIOGRAPHY N/A 11/14/2016   Procedure: LEFT HEART CATH AND CORS/GRAFTS ANGIOGRAPHY;  Surgeon: Belva Crome, MD;  Location: [facility];  Service: Cardiovascular;  Laterality: N/A;  . LEFT HEART CATHETERIZATION WITH CORONARY ANGIOGRAM N/A 07/04/2012   Procedure: LEFT HEART CATHETERIZATION WITH CORONARY ANGIOGRAM;  Surgeon: Wellington Hampshire, MD;  Location: [facility] CATH [facility];  Service: Cardiovascular;  Laterality: N/A;  . RENAL BIOPSY    . ROBOT ASSISTED LAPAROSCOPIC NEPHRECTOMY Left 02/21/2017   Procedure: XI ROBOTIC ASSISTED LAPAROSCOPIC NEPHRECTOMY;  Surgeon: Alexis Frock, MD;  Location: [facility];  Service: Urology;  Laterality: Left;        Home Medications    Prior to Admission medications   Medication Sig Start Date End Date Taking? Authorizing Provider  aspirin EC 81 MG tablet Take 81 mg by mouth daily.   Yes [provider]  atorvastatin (LIPITOR) 40 MG tablet Take 1 tablet (40 mg total) by mouth daily. 05/20/13  Yes Wellington Hampshire, MD  carvedilol (COREG) 6.25 MG tablet Take 1 tablet (6.25 mg total) by mouth 2 (two) times daily. 08/28/17  Yes Wellington Hampshire, MD  metFORMIN (GLUCOPHAGE) 1000 MG tablet Take 1 tablet (1,000 mg total) by mouth 2 (two) times daily. 04/21/17  Yes Rai, Ripudeep K, MD  naproxen sodium (ALEVE) 220 MG tablet Take 440 mg by mouth daily as needed (arthritis).   Yes [provider]  pantoprazole (PROTONIX) 40 MG tablet Take 1 tablet (40 mg total) by mouth at bedtime. 11/22/16  Yes Patteson, Arlan Organ, NP  ondansetron (ZOFRAN ODT) 4 MG disintegrating tablet Take 1 tablet (4 mg total) by mouth every 8 (eight) hours as needed for nausea or vomiting. Patient not taking: Reported on 10/03/2017 04/20/17   Mendel Corning, MD  prochlorperazine (COMPAZINE) 10 MG tablet Take 1 tablet (10 mg total) by mouth every 6 (six) hours as needed for nausea or vomiting. Patient not taking: Reported on 10/03/2017 04/20/17   Mendel Corning, MD    Family  History Family History  Problem Relation Age of Onset  . Cancer Mother        leukemia  . Stroke Mother   . Cancer Maternal Grandfather        possible cancer  . Heart attack Brother 22    Social History Social History   Tobacco Use  . Smoking status: Former Smoker    Packs/day: 1.00    Years: 50.00    Pack years: 50.00    Last attempt to quit: 04/20/2012    Years since quitting: 5.4  . Smokeless tobacco: Never Used  Substance Use Topics  . Alcohol use: No  . Drug use: No     Allergies   Patient has no known allergies.   Review of Systems Review of Systems  All other systems reviewed and are negative.    Physical  Exam Updated Vital Signs BP (!) 152/99   Pulse (!) 50   Temp 98 F (36.7 C) (Oral)   Resp 17   SpO2 100%   Physical Exam  Constitutional: He is oriented to person, place, and time. He appears well-developed and well-nourished. No distress.  HENT:  Head: Normocephalic and atraumatic.  Right Ear: External ear normal.  Left Ear: External ear normal.  Mouth/Throat: Oropharynx is clear and moist.  Eyes: Conjunctivae are normal. Right eye exhibits no discharge. Left eye exhibits no discharge. No scleral icterus.  Neck: Neck supple. No tracheal deviation present.  Cardiovascular: Normal rate, regular rhythm and intact distal pulses.  Pulmonary/Chest: Effort normal and breath sounds normal. No stridor. No respiratory distress. He has no wheezes. He has no rales.  Abdominal: Soft. Bowel sounds are normal. He exhibits no distension. There is no tenderness. There is no rebound and no guarding.  Musculoskeletal: He exhibits no edema or tenderness.  Neurological: He is alert and oriented to person, place, and time. He has normal strength. No cranial nerve deficit (No facial droop, extraocular movements intact, tongue midline ) or sensory deficit. He exhibits normal muscle tone. He displays no seizure activity. Coordination normal.  No pronator drift bilateral  upper extrem, able to hold both legs off bed for 5 seconds, sensation intact in all extremities, no visual field cuts, no left or right sided neglect, normal finger-nose exam bilaterally, no nystagmus noted   Skin: Skin is warm and dry. No rash noted.  Psychiatric: He has a normal mood and affect.  Nursing note and vitals reviewed.    ED Treatments / Results  Labs (all labs ordered are listed, but only abnormal results are displayed) Labs Reviewed  CBC WITH DIFFERENTIAL/PLATELET - Abnormal; Notable for the following components:      Result Value   RBC 3.42 (*)    Hemoglobin 10.8 (*)    HCT 33.3 (*)    All other components within normal limits  COMPREHENSIVE METABOLIC PANEL - Abnormal; Notable for the following components:   Potassium 5.3 (*)    Glucose, Bld 103 (*)    BUN 35 (*)    Creatinine, Ser 1.91 (*)    GFR calc non Af Amer 34 (*)    GFR calc Af Amer 39 (*)    All other components within normal limits  CBG MONITORING, ED - Abnormal; Notable for the following components:   Glucose-Capillary 102 (*)    All other components within normal limits  PROTIME-INR    EKG EKG Interpretation  Date/Time:  Wednesday October 03 2017 15:26:51 EDT Ventricular Rate:  79 PR Interval:    QRS Duration: 86 QT Interval:  375 QTC Calculation: 430 R Axis:   14 Text Interpretation:  Sinus rhythm Ventricular bigeminy , new since last tracing Borderline repolarization abnormality Confirmed by Dorie Rank [phone]) on 10/03/2017 4:58:38 PM   Radiology Dg Chest 2 View  Result Date: 10/03/2017 CLINICAL DATA:  Patient with history of kidney cancer (10.2018), receiving infusion treatments, presents with complaints of dizziness, especially when standing. Patient reports he has felt this way before and was concerned he was having another .*comment was truncated* EXAM: CHEST - 2 VIEW COMPARISON:  07/02/2017 CT FINDINGS: Prior CABG. Low lung volumes are present, causing crowding of the pulmonary  vasculature. Heart size within normal limits for projection. The pulmonary nodule shown on prior chest CT are not readily apparent on chest radiography. Atherosclerotic calcification of the aortic arch. IMPRESSION: 1. No active cardiopulmonary disease  is radiographically apparent. The patient has known pulmonary nodules on prior chest CT are not highly apparent on today's chest radiography. 2.  Aortic Atherosclerosis (ICD10-I70.0). Electronically Signed   By: Van Clines M.D.   On: 10/03/2017 16:14    Procedures Procedures (including critical care time)  Medications Ordered in ED Medications  sodium chloride 0.9 % bolus 500 mL (0 mLs Intravenous Stopped 10/03/17 1739)  sodium chloride 0.9 % bolus 500 mL (0 mLs Intravenous Stopped 10/03/17 1825)     Initial Impression / Assessment and Plan / ED Course  I have reviewed the triage vital signs and the nursing notes.  Pertinent labs & imaging results that were available during my care of the patient were reviewed by me and considered in my medical decision making (see chart for details).  Clinical Course as of Oct 03 1920  Wed Oct 03, 2017  1752 Anemia is stable  CBC WITH DIFFERENTIAL(!) [JK]  [id number] Per RN  [place], [place], [place]  10/03/2017 18:47         Added by: Milana Obey, NT   Hover for details   PT was walked down the hall and back, pt wanted to go for a longer walk to ensure that his dizziness had went away and he states that he felt better then when he came in... Which makes things a lot better for him ... Pt also walked at a great steady pace.        [JK]    Clinical Course User Index [JK] Dorie Rank, MD    Patient presented to the emergency room for evaluation of dizziness.  Symptoms were suggestive of orthostatic hypotension.  His symptoms occurred when he was standing up.  Did not have any focal neurologic deficits on exam.  I doubt stroke or TIA.  Patient's laboratory tests are notable for a  mild increase in his BUN and creatinine which would correlate with dehydration.  Patient was treated with IV fluids.  He was able to walk around the emergency room without any difficulty after treatment.  He feels like his symptoms have resolved.  At this time there does not appear to be any evidence of an acute emergency medical condition and the patient appears stable for discharge with appropriate outpatient follow up.   Final Clinical Impressions(s) / ED Diagnoses   Final diagnoses:  Dehydration    ED Discharge Orders    None       Dorie Rank, MD 10/03/17 [facility] [place]

## 2017-10-03 NOTE — ED Notes (Signed)
EKG STORED

## 2017-10-04 ENCOUNTER — Telehealth: Payer: Self-pay | Admitting: Internal Medicine

## 2017-10-04 ENCOUNTER — Inpatient Hospital Stay: Payer: Medicare Other | Attending: Internal Medicine

## 2017-10-04 ENCOUNTER — Inpatient Hospital Stay: Payer: Medicare Other | Admitting: Internal Medicine

## 2017-10-04 ENCOUNTER — Inpatient Hospital Stay: Payer: Medicare Other

## 2017-10-04 ENCOUNTER — Encounter: Payer: Self-pay | Admitting: Internal Medicine

## 2017-10-04 VITALS — BP 118/71 | HR 85 | Temp 98.6°F | Resp 18

## 2017-10-04 DIAGNOSIS — M129 Arthropathy, unspecified: Secondary | ICD-10-CM | POA: Diagnosis not present

## 2017-10-04 DIAGNOSIS — I2510 Atherosclerotic heart disease of native coronary artery without angina pectoris: Secondary | ICD-10-CM | POA: Diagnosis not present

## 2017-10-04 DIAGNOSIS — E785 Hyperlipidemia, unspecified: Secondary | ICD-10-CM | POA: Diagnosis not present

## 2017-10-04 DIAGNOSIS — R42 Dizziness and giddiness: Secondary | ICD-10-CM

## 2017-10-04 DIAGNOSIS — Z5112 Encounter for antineoplastic immunotherapy: Secondary | ICD-10-CM | POA: Insufficient documentation

## 2017-10-04 DIAGNOSIS — Z7982 Long term (current) use of aspirin: Secondary | ICD-10-CM | POA: Insufficient documentation

## 2017-10-04 DIAGNOSIS — I509 Heart failure, unspecified: Secondary | ICD-10-CM | POA: Diagnosis not present

## 2017-10-04 DIAGNOSIS — Z8673 Personal history of transient ischemic attack (TIA), and cerebral infarction without residual deficits: Secondary | ICD-10-CM | POA: Insufficient documentation

## 2017-10-04 DIAGNOSIS — E119 Type 2 diabetes mellitus without complications: Secondary | ICD-10-CM | POA: Diagnosis not present

## 2017-10-04 DIAGNOSIS — D649 Anemia, unspecified: Secondary | ICD-10-CM

## 2017-10-04 DIAGNOSIS — I5022 Chronic systolic (congestive) heart failure: Secondary | ICD-10-CM | POA: Insufficient documentation

## 2017-10-04 DIAGNOSIS — Z7984 Long term (current) use of oral hypoglycemic drugs: Secondary | ICD-10-CM | POA: Insufficient documentation

## 2017-10-04 DIAGNOSIS — R918 Other nonspecific abnormal finding of lung field: Secondary | ICD-10-CM | POA: Insufficient documentation

## 2017-10-04 DIAGNOSIS — C642 Malignant neoplasm of left kidney, except renal pelvis: Secondary | ICD-10-CM | POA: Insufficient documentation

## 2017-10-04 DIAGNOSIS — Z79899 Other long term (current) drug therapy: Secondary | ICD-10-CM | POA: Diagnosis not present

## 2017-10-04 LAB — CBC WITH DIFFERENTIAL (CANCER CENTER ONLY)
BASOS PCT: 0 %
Basophils Absolute: 0 10*3/uL (ref 0.0–0.1)
Eosinophils Absolute: 0.7 10*3/uL — ABNORMAL HIGH (ref 0.0–0.5)
Eosinophils Relative: 8 %
HEMATOCRIT: 32.5 % — AB (ref 38.4–49.9)
HEMOGLOBIN: 10.4 g/dL — AB (ref 13.0–17.1)
LYMPHS PCT: 17 %
Lymphs Abs: 1.4 10*3/uL (ref 0.9–3.3)
MCH: 31 pg (ref 27.2–33.4)
MCHC: 32 g/dL (ref 32.0–36.0)
MCV: 97 fL (ref 79.3–98.0)
Monocytes Absolute: 0.6 10*3/uL (ref 0.1–0.9)
Monocytes Relative: 7 %
NEUTROS ABS: 5.5 10*3/uL (ref 1.5–6.5)
NEUTROS PCT: 68 %
Platelet Count: 171 10*3/uL (ref 140–400)
RBC: 3.35 MIL/uL — AB (ref 4.20–5.82)
RDW: 13.5 % (ref 11.0–14.6)
WBC: 8.1 10*3/uL (ref 4.0–10.3)

## 2017-10-04 LAB — CMP (CANCER CENTER ONLY)
ALT: 11 U/L (ref 0–44)
ANION GAP: 10 (ref 5–15)
AST: 13 U/L — ABNORMAL LOW (ref 15–41)
Albumin: 4.3 g/dL (ref 3.5–5.0)
Alkaline Phosphatase: 94 U/L (ref 38–126)
BILIRUBIN TOTAL: 0.5 mg/dL (ref 0.3–1.2)
BUN: 31 mg/dL — ABNORMAL HIGH (ref 8–23)
CO2: 20 mmol/L — ABNORMAL LOW (ref 22–32)
Calcium: 9.9 mg/dL (ref 8.9–10.3)
Chloride: 110 mmol/L (ref 98–111)
Creatinine: 1.84 mg/dL — ABNORMAL HIGH (ref 0.61–1.24)
GFR, EST AFRICAN AMERICAN: 41 mL/min — AB (ref >60)
GFR, Estimated: 35 mL/min — ABNORMAL LOW (ref >60)
Glucose, Bld: 114 mg/dL — ABNORMAL HIGH (ref 70–99)
POTASSIUM: 5.3 mmol/L — AB (ref 3.5–5.1)
Sodium: 140 mmol/L (ref 135–145)
TOTAL PROTEIN: 7 g/dL (ref 6.5–8.1)

## 2017-10-04 LAB — TSH: TSH: 0.966 u[IU]/mL (ref 0.320–4.118)

## 2017-10-04 MED ORDER — SODIUM CHLORIDE 0.9 % IV SOLN
Freq: Once | INTRAVENOUS | Status: AC
  Administered 2017-10-04: 12:00:00 via INTRAVENOUS

## 2017-10-04 MED ORDER — SODIUM CHLORIDE 0.9 % IV SOLN
480.0000 mg | Freq: Once | INTRAVENOUS | Status: AC
  Administered 2017-10-04: 480 mg via INTRAVENOUS
  Filled 2017-10-04: qty 48

## 2017-10-04 NOTE — Patient Instructions (Signed)
Cancer Center Discharge Instructions for Patients Receiving Chemotherapy  Today you received the following chemotherapy agents: Nivolumab  To help prevent nausea and vomiting after your treatment, we encourage you to take your nausea medication as directed.    If you develop nausea and vomiting that is not controlled by your nausea medication, call the clinic.   BELOW ARE SYMPTOMS THAT SHOULD BE REPORTED IMMEDIATELY:  *FEVER GREATER THAN 100.5 F  *CHILLS WITH OR WITHOUT FEVER  NAUSEA AND VOMITING THAT IS NOT CONTROLLED WITH YOUR NAUSEA MEDICATION  *UNUSUAL SHORTNESS OF BREATH  *UNUSUAL BRUISING OR BLEEDING  TENDERNESS IN MOUTH AND THROAT WITH OR WITHOUT PRESENCE OF ULCERS  *URINARY PROBLEMS  *BOWEL PROBLEMS  UNUSUAL RASH Items with * indicate a potential emergency and should be followed up as soon as possible.  Feel free to call the clinic should you have any questions or concerns. The clinic phone number is [phone].  Please show the CHEMO ALERT CARD at check-in to the Emergency Department and triage nurse.   

## 2017-10-04 NOTE — Progress Notes (Signed)
Ok to treat with 10/04/17 lab results per Dr. Julien Nordmann.

## 2017-10-04 NOTE — Progress Notes (Signed)
[facility] [place] [phone]   [phone]  OFFICE PROGRESS NOTE  Via, [name], MD [place] [address]  DIAGNOSIS: [name].The patient presented with an11 cm left kidney mass and pulmonary nodules.  PRIOR THERAPY: 1) Status post left nephrectomy on 02/21/2017. 20 Cabometyx 60 mg daily. Started 01/21/2017.Status post 1 month of treatment. Treatment was then placed on hold for nephrectomy. Resuming treatment on 03/22/2017.  Status post 3 weeks of treatment.  The patient was advised to hold Cabometyx starting on 04/11/2017.  This dose will be changed to 40 mg p.o. daily.This was a started May 12, 2017.  Status post 2 months of treatment discontinued secondary to intolerance.   CURRENT THERAPY: Immunotherapy with Nivolumab 418 mg IV every 4 weeks status post 2 cycles.  INTERVAL HISTORY: Trevor [name] 72 y.o. male returns to the clinic today for follow-up visit.  The patient is feeling fine today except for occasional dizzy spells with a change in position.  He was seen yesterday at the emergency department and was found to have orthostatic hypotension and he was given IV fluid.  He denied having any current chest pain, shortness breath, cough or hemoptysis.  He denied having any fever or chills.  He has no nausea, vomiting, diarrhea or constipation.  He has no recent weight loss or night sweats.  He continues to tolerate his treatment with Nivolumab fairly well.  The patient is here today for evaluation before starting cycle #3 of his treatment.  MEDICAL HISTORY: Past Medical History:  Diagnosis Date  . 3-vessel coronary artery disease   . Anemia    2 iron infusions  . Arthritis   . Carotid disease, bilateral (HCC)    moderate  . Chronic systolic heart failure (HCC)    EF 45%  . Coronary artery disease    06/2012:STEMI s/p CABG; 8/18 NSTEMI  . Diabetes mellitus without complication ([place])   .  Diarrhea   . Dyspnea    mild  . History of blood transfusion   . Hyperlipidemia   . Ischemic cardiomyopathy    Ejection fraction of 35-40% initially, 45 - 50% on echo 2014, 53% by perfusion study 2016  . Mild mitral regurgitation   . Numbness    Right side since stroke  . OA (osteoarthritis)   . PAD (peripheral artery disease) ([place])   . Pulmonary nodule   . Renal cell cancer, left ([place]) 12/2016   Associated w/ pulmonary nodules  . Stroke ([facility]) 2018  . TIA (transient ischemic attack) 10/2016  . Weakness generalized   . Wears glasses     ALLERGIES:  has No Known Allergies.  MEDICATIONS:  Current Outpatient Medications  Medication Sig Dispense Refill  . aspirin EC 81 MG tablet Take 81 mg by mouth daily.    Marland Kitchen atorvastatin (LIPITOR) 40 MG tablet Take 1 tablet (40 mg total) by mouth daily. 30 tablet 6  . carvedilol (COREG) 6.25 MG tablet Take 1 tablet (6.25 mg total) by mouth 2 (two) times daily. 180 tablet 1  . metFORMIN (GLUCOPHAGE) 1000 MG tablet Take 1 tablet (1,000 mg total) by mouth 2 (two) times daily.  0  . naproxen sodium (ALEVE) 220 MG tablet Take 440 mg by mouth daily as needed (arthritis).    . ondansetron (ZOFRAN ODT) 4 MG disintegrating tablet Take 1 tablet (4 mg total) by mouth every 8 (eight) hours as needed for nausea or vomiting. 20 tablet 0  .  pantoprazole (PROTONIX) 40 MG tablet Take 1 tablet (40 mg total) by mouth at bedtime. 30 tablet 0  . prochlorperazine (COMPAZINE) 10 MG tablet Take 1 tablet (10 mg total) by mouth every 6 (six) hours as needed for nausea or vomiting. 30 tablet 0   No current facility-administered medications for this visit.     SURGICAL HISTORY:  Past Surgical History:  Procedure Laterality Date  . CARDIOVASCULAR STRESS TEST    . CORONARY ARTERY BYPASS GRAFT N/A 07/05/2012   Procedure: CORONARY ARTERY BYPASS GRAFTING (CABG);  Surgeon: Ivin Poot, MD;  Location: [facility];  Service: Open Heart Surgery;  Laterality: N/A;  . INTRAOPERATIVE  TRANSESOPHAGEAL ECHOCARDIOGRAM N/A 07/05/2012   Procedure: INTRAOPERATIVE TRANSESOPHAGEAL ECHOCARDIOGRAM;  Surgeon: Ivin Poot, MD;  Location: [facility];  Service: Open Heart Surgery;  Laterality: N/A;  . LEFT HEART CATH AND CORS/GRAFTS ANGIOGRAPHY N/A 11/14/2016   Procedure: LEFT HEART CATH AND CORS/GRAFTS ANGIOGRAPHY;  Surgeon: Belva Crome, MD;  Location: [facility];  Service: Cardiovascular;  Laterality: N/A;  . LEFT HEART CATHETERIZATION WITH CORONARY ANGIOGRAM N/A 07/04/2012   Procedure: LEFT HEART CATHETERIZATION WITH CORONARY ANGIOGRAM;  Surgeon: Wellington Hampshire, MD;  Location: [facility];  Service: Cardiovascular;  Laterality: N/A;  . RENAL BIOPSY    . ROBOT ASSISTED LAPAROSCOPIC NEPHRECTOMY Left 02/21/2017   Procedure: XI ROBOTIC ASSISTED LAPAROSCOPIC NEPHRECTOMY;  Surgeon: Alexis Frock, MD;  Location: [facility];  Service: Urology;  Laterality: Left;    REVIEW OF SYSTEMS:  A comprehensive review of systems was negative except for: Constitutional: positive for fatigue   PHYSICAL EXAMINATION: General appearance: alert, cooperative, fatigued and no distress Head: Normocephalic, without obvious abnormality, atraumatic Neck: no adenopathy, no JVD, supple, symmetrical, trachea midline and thyroid not enlarged, symmetric, no tenderness/mass/nodules Lymph nodes: Cervical, supraclavicular, and axillary nodes normal. Resp: clear to auscultation bilaterally Back: symmetric, no curvature. ROM normal. No CVA tenderness. Cardio: regular rate and rhythm, S1, S2 normal, no murmur, click, rub or gallop GI: soft, non-tender; bowel sounds normal; no masses,  no organomegaly Extremities: extremities normal, atraumatic, no cyanosis or edema  ECOG PERFORMANCE STATUS: 1 - Symptomatic but completely ambulatory  Blood pressure 118/71, pulse 85, temperature 98.6 F (37 C), resp. rate 18, SpO2 100 %.  LABORATORY DATA: Lab Results  Component Value Date   WBC 8.1 10/04/2017   HGB 10.4 (L)  10/04/2017   HCT 32.5 (L) 10/04/2017   MCV 97.0 10/04/2017   PLT 171 10/04/2017      Chemistry      Component Value Date/Time   NA 140 10/04/2017 1025   NA 141 03/22/2017 0919   K 5.3 (H) 10/04/2017 1025   K 5.5 (H) 03/22/2017 0919   CL 110 10/04/2017 1025   CO2 20 (L) 10/04/2017 1025   CO2 27 03/22/2017 0919   BUN 31 (H) 10/04/2017 1025   BUN 18.1 03/22/2017 0919   CREATININE 1.84 (H) 10/04/2017 1025   CREATININE 1.2 03/22/2017 0919      Component Value Date/Time   CALCIUM 9.9 10/04/2017 1025   CALCIUM 9.8 03/22/2017 0919   ALKPHOS 94 10/04/2017 1025   ALKPHOS 103 03/22/2017 0919   AST 13 (L) 10/04/2017 1025   AST 15 03/22/2017 0919   ALT 11 10/04/2017 1025   ALT 13 03/22/2017 0919   BILITOT 0.5 10/04/2017 1025   BILITOT 0.41 03/22/2017 0919       RADIOGRAPHIC STUDIES: Dg Chest 2 View  Result Date: 10/03/2017 CLINICAL DATA:  Patient with  history of kidney cancer (10.2018), receiving infusion treatments, presents with complaints of dizziness, especially when standing. Patient reports he has felt this way before and was concerned he was having another .*comment was truncated* EXAM: CHEST - 2 VIEW COMPARISON:  07/02/2017 CT FINDINGS: Prior CABG. Low lung volumes are present, causing crowding of the pulmonary vasculature. Heart size within normal limits for projection. The pulmonary nodule shown on prior chest CT are not readily apparent on chest radiography. Atherosclerotic calcification of the aortic arch. IMPRESSION: 1. No active cardiopulmonary disease is radiographically apparent. The patient has known pulmonary nodules on prior chest CT are not highly apparent on today's chest radiography. 2.  Aortic Atherosclerosis (ICD10-I70.0). Electronically Signed   By: Van Clines M.D.   On: 10/03/2017 16:14    ASSESSMENT AND PLAN: This is a very pleasant 72 years old white male diagnosed with metastatic renal cell carcinoma, chromophobe type presented with large left kidney  mass as well as multiple bilateral pulmonary nodules.  He is status post left nephrectomy on February 21, 2017.  The patient is currently on treatment with Cabometyx initially at a dose of 60 mg p.o. daily but he has a rough time tolerating this treatment with several episodes of diarrhea as well as nausea. His treatment was switched to Cabometyx at a dose of 40 mg p.o. Daily status post 2 months of treatment.  He has a stable disease but the treatment was discontinued secondary to intolerance.   He was started on treatment with immunotherapy was Nivolumab 480 mg IV every 4 weeks status post 2 cycles.  He has been tolerating this treatment much better.   I recommended for him to proceed with cycle #3 today. I will see him back for follow-up visit in 4 weeks for evaluation and repeat CT scan of the chest, abdomen and pelvis for restaging of his disease before cycle #4. The patient was advised to call immediately if he has any concerning symptoms in the interval. The patient voices understanding of current disease status and treatment options and is in agreement with the current care plan. All questions were answered. The patient knows to call the clinic with any problems, questions or concerns. We can certainly see the patient much sooner if necessary.  I spent 10 minutes counseling the patient face to face. The total time spent in the appointment was 15 minutes.  Disclaimer: This note was dictated with voice recognition software. Similar sounding words can inadvertently be transcribed and may not be corrected upon review.

## 2017-10-04 NOTE — Telephone Encounter (Signed)
Added 3rd cycles per 7/18 los - pt to get an updated schedule next visit . Central radiology to contact patient with ct scan .

## 2017-10-31 ENCOUNTER — Ambulatory Visit (HOSPITAL_COMMUNITY)
Admission: RE | Admit: 2017-10-31 | Discharge: 2017-10-31 | Disposition: A | Discharge status: Home / Self Care | Payer: Medicare Other | Source: Ambulatory Visit | Attending: Internal Medicine | Admitting: Internal Medicine

## 2017-10-31 ENCOUNTER — Encounter (HOSPITAL_COMMUNITY): Payer: Self-pay

## 2017-10-31 DIAGNOSIS — R918 Other nonspecific abnormal finding of lung field: Secondary | ICD-10-CM | POA: Diagnosis not present

## 2017-10-31 DIAGNOSIS — C642 Malignant neoplasm of left kidney, except renal pelvis: Secondary | ICD-10-CM | POA: Insufficient documentation

## 2017-10-31 DIAGNOSIS — Z905 Acquired absence of kidney: Secondary | ICD-10-CM | POA: Diagnosis not present

## 2017-11-01 ENCOUNTER — Inpatient Hospital Stay: Payer: Medicare Other

## 2017-11-01 ENCOUNTER — Telehealth: Payer: Self-pay | Admitting: Internal Medicine

## 2017-11-01 ENCOUNTER — Encounter: Payer: Self-pay | Admitting: Internal Medicine

## 2017-11-01 ENCOUNTER — Inpatient Hospital Stay: Payer: Medicare Other | Attending: Internal Medicine

## 2017-11-01 ENCOUNTER — Inpatient Hospital Stay: Payer: Medicare Other | Admitting: Internal Medicine

## 2017-11-01 VITALS — BP 133/86 | HR 88 | Temp 98.1°F | Resp 18 | Ht 71.0 in | Wt 178.7 lb

## 2017-11-01 DIAGNOSIS — I2510 Atherosclerotic heart disease of native coronary artery without angina pectoris: Secondary | ICD-10-CM | POA: Diagnosis not present

## 2017-11-01 DIAGNOSIS — E785 Hyperlipidemia, unspecified: Secondary | ICD-10-CM | POA: Insufficient documentation

## 2017-11-01 DIAGNOSIS — C642 Malignant neoplasm of left kidney, except renal pelvis: Secondary | ICD-10-CM

## 2017-11-01 DIAGNOSIS — I255 Ischemic cardiomyopathy: Secondary | ICD-10-CM | POA: Diagnosis not present

## 2017-11-01 DIAGNOSIS — M1990 Unspecified osteoarthritis, unspecified site: Secondary | ICD-10-CM

## 2017-11-01 DIAGNOSIS — N400 Enlarged prostate without lower urinary tract symptoms: Secondary | ICD-10-CM

## 2017-11-01 DIAGNOSIS — I10 Essential (primary) hypertension: Secondary | ICD-10-CM

## 2017-11-01 DIAGNOSIS — Z8673 Personal history of transient ischemic attack (TIA), and cerebral infarction without residual deficits: Secondary | ICD-10-CM | POA: Diagnosis not present

## 2017-11-01 DIAGNOSIS — Z905 Acquired absence of kidney: Secondary | ICD-10-CM | POA: Diagnosis not present

## 2017-11-01 DIAGNOSIS — I5022 Chronic systolic (congestive) heart failure: Secondary | ICD-10-CM | POA: Diagnosis not present

## 2017-11-01 DIAGNOSIS — R918 Other nonspecific abnormal finding of lung field: Secondary | ICD-10-CM | POA: Insufficient documentation

## 2017-11-01 DIAGNOSIS — I739 Peripheral vascular disease, unspecified: Secondary | ICD-10-CM

## 2017-11-01 DIAGNOSIS — I340 Nonrheumatic mitral (valve) insufficiency: Secondary | ICD-10-CM | POA: Diagnosis not present

## 2017-11-01 DIAGNOSIS — M129 Arthropathy, unspecified: Secondary | ICD-10-CM | POA: Insufficient documentation

## 2017-11-01 DIAGNOSIS — I110 Hypertensive heart disease with heart failure: Secondary | ICD-10-CM

## 2017-11-01 DIAGNOSIS — Z5112 Encounter for antineoplastic immunotherapy: Secondary | ICD-10-CM

## 2017-11-01 DIAGNOSIS — I700 Atherosclerosis of aorta: Secondary | ICD-10-CM

## 2017-11-01 DIAGNOSIS — Z79899 Other long term (current) drug therapy: Secondary | ICD-10-CM | POA: Insufficient documentation

## 2017-11-01 DIAGNOSIS — Z7982 Long term (current) use of aspirin: Secondary | ICD-10-CM | POA: Insufficient documentation

## 2017-11-01 DIAGNOSIS — I252 Old myocardial infarction: Secondary | ICD-10-CM

## 2017-11-01 DIAGNOSIS — Z7984 Long term (current) use of oral hypoglycemic drugs: Secondary | ICD-10-CM

## 2017-11-01 DIAGNOSIS — E119 Type 2 diabetes mellitus without complications: Secondary | ICD-10-CM | POA: Insufficient documentation

## 2017-11-01 LAB — CBC WITH DIFFERENTIAL (CANCER CENTER ONLY)
Basophils Absolute: 0.1 10*3/uL (ref 0.0–0.1)
Basophils Relative: 1 %
EOS PCT: 9 %
Eosinophils Absolute: 0.7 10*3/uL — ABNORMAL HIGH (ref 0.0–0.5)
HEMATOCRIT: 30.2 % — AB (ref 38.4–49.9)
Hemoglobin: 10.2 g/dL — ABNORMAL LOW (ref 13.0–17.1)
LYMPHS ABS: 1.3 10*3/uL (ref 0.9–3.3)
LYMPHS PCT: 15 %
MCH: 31.4 pg (ref 27.2–33.4)
MCHC: 33.8 g/dL (ref 32.0–36.0)
MCV: 92.8 fL (ref 79.3–98.0)
MONO ABS: 0.7 10*3/uL (ref 0.1–0.9)
Monocytes Relative: 8 %
NEUTROS ABS: 5.7 10*3/uL (ref 1.5–6.5)
Neutrophils Relative %: 67 %
Platelet Count: 157 10*3/uL (ref 140–400)
RBC: 3.26 MIL/uL — ABNORMAL LOW (ref 4.20–5.82)
RDW: 14.3 % (ref 11.0–14.6)
WBC Count: 8.4 10*3/uL (ref 4.0–10.3)

## 2017-11-01 LAB — CMP (CANCER CENTER ONLY)
ALBUMIN: 4.1 g/dL (ref 3.5–5.0)
ALT: 17 U/L (ref 0–44)
AST: 18 U/L (ref 15–41)
Alkaline Phosphatase: 100 U/L (ref 38–126)
Anion gap: 11 (ref 5–15)
BUN: 24 mg/dL — ABNORMAL HIGH (ref 8–23)
CHLORIDE: 107 mmol/L (ref 98–111)
CO2: 23 mmol/L (ref 22–32)
Calcium: 9.4 mg/dL (ref 8.9–10.3)
Creatinine: 1.61 mg/dL — ABNORMAL HIGH (ref 0.61–1.24)
GFR, Est AFR Am: 48 mL/min — ABNORMAL LOW (ref >60)
GFR, Estimated: 41 mL/min — ABNORMAL LOW (ref >60)
GLUCOSE: 119 mg/dL — AB (ref 70–99)
Potassium: 5.2 mmol/L — ABNORMAL HIGH (ref 3.5–5.1)
SODIUM: 141 mmol/L (ref 135–145)
Total Bilirubin: 0.4 mg/dL (ref 0.3–1.2)
Total Protein: 7.1 g/dL (ref 6.5–8.1)

## 2017-11-01 LAB — TSH: TSH: 2.251 u[IU]/mL (ref 0.320–4.118)

## 2017-11-01 MED ORDER — SODIUM CHLORIDE 0.9 % IV SOLN
480.0000 mg | Freq: Once | INTRAVENOUS | Status: AC
  Administered 2017-11-01: 480 mg via INTRAVENOUS
  Filled 2017-11-01: qty 48

## 2017-11-01 MED ORDER — SODIUM CHLORIDE 0.9 % IV SOLN
Freq: Once | INTRAVENOUS | Status: AC
  Administered 2017-11-01: 10:00:00 via INTRAVENOUS
  Filled 2017-11-01: qty 250

## 2017-11-01 NOTE — Patient Instructions (Signed)
[facility] Discharge Instructions for Patients Receiving Chemotherapy  Today you received the following chemotherapy agents: Nivolumab  To help prevent nausea and vomiting after your treatment, we encourage you to take your nausea medication as directed.    If you develop nausea and vomiting that is not controlled by your nausea medication, call the clinic.   BELOW ARE SYMPTOMS THAT SHOULD BE REPORTED IMMEDIATELY:  *FEVER GREATER THAN 100.5 F  *CHILLS WITH OR WITHOUT FEVER  NAUSEA AND VOMITING THAT IS NOT CONTROLLED WITH YOUR NAUSEA MEDICATION  *UNUSUAL SHORTNESS OF BREATH  *UNUSUAL BRUISING OR BLEEDING  TENDERNESS IN MOUTH AND THROAT WITH OR WITHOUT PRESENCE OF ULCERS  *URINARY PROBLEMS  *BOWEL PROBLEMS  UNUSUAL RASH Items with * indicate a potential emergency and should be followed up as soon as possible.  Feel free to call the clinic should you have any questions or concerns. The clinic phone number is [phone].  Please show the CHEMO ALERT CARD at check-in to the Emergency Department and triage nurse.   

## 2017-11-01 NOTE — Telephone Encounter (Signed)
Scheduled appt per 8/15 los - gave patient AVS and calender per los.  

## 2017-11-01 NOTE — Progress Notes (Signed)
[place] [phone]   [phone]  OFFICE PROGRESS NOTE  Via, [name], MD [place] [address]  DIAGNOSIS: [name].The patient presented with an11 cm left kidney mass and pulmonary nodules.  PRIOR THERAPY: 1) Status post left nephrectomy on 02/21/2017. 20 Cabometyx 60 mg daily. Started 01/21/2017.Status post 1 month of treatment. Treatment was then placed on hold for nephrectomy. Resuming treatment on 03/22/2017.  Status post 3 weeks of treatment.  The patient was advised to hold Cabometyx starting on 04/11/2017.  This dose will be changed to 40 mg p.o. daily.This was a started May 12, 2017.  Status post 2 months of treatment discontinued secondary to intolerance.   CURRENT THERAPY: Immunotherapy with Nivolumab 418 mg IV every 4 weeks status post 3 cycles.  INTERVAL HISTORY: Trevor [name] 72 y.o. male returns to the clinic today for follow-up visit.  The patient is feeling fine today with no specific complaints.  He continues to tolerate his treatment with Nivolumab fairly well.  He denied having any chest pain, shortness breath, cough or hemoptysis.  He denied having any fatigue or weakness.  He has no nausea, vomiting, diarrhea or constipation.  He has no recent weight loss or night sweats.  He has no headache or visual changes.  The patient had repeat CT scan of the chest, abdomen and pelvis performed recently and he is here for evaluation and discussion of his discuss results before starting cycle #4.  MEDICAL HISTORY: Past Medical History:  Diagnosis Date  . 3-vessel coronary artery disease   . Anemia    2 iron infusions  . Arthritis   . Carotid disease, bilateral (HCC)    moderate  . Chronic systolic heart failure (HCC)    EF 45%  . Coronary artery disease    06/2012:STEMI s/p CABG; 8/18 NSTEMI  . Diabetes mellitus without complication ([facility])   . Diarrhea   . Dyspnea    mild    . History of blood transfusion   . Hyperlipidemia   . Ischemic cardiomyopathy    Ejection fraction of 35-40% initially, 45 - 50% on echo 2014, 53% by perfusion study 2016  . Mild mitral regurgitation   . Numbness    Right side since stroke  . OA (osteoarthritis)   . PAD (peripheral artery disease) ([facility])   . Pulmonary nodule   . Renal cell cancer, left ([facility]) 12/2016   Associated w/ pulmonary nodules  . Stroke ([facility]) 2018  . TIA (transient ischemic attack) 10/2016  . Weakness generalized   . Wears glasses     ALLERGIES:  has No Known Allergies.  MEDICATIONS:  Current Outpatient Medications  Medication Sig Dispense Refill  . aspirin EC 81 MG tablet Take 81 mg by mouth daily.    Marland Kitchen atorvastatin (LIPITOR) 40 MG tablet Take 1 tablet (40 mg total) by mouth daily. 30 tablet 6  . carvedilol (COREG) 6.25 MG tablet Take 1 tablet (6.25 mg total) by mouth 2 (two) times daily. 180 tablet 1  . metFORMIN (GLUCOPHAGE) 1000 MG tablet Take 1 tablet (1,000 mg total) by mouth 2 (two) times daily.  0  . naproxen sodium (ALEVE) 220 MG tablet Take 440 mg by mouth daily as needed (arthritis).    . ondansetron (ZOFRAN ODT) 4 MG disintegrating tablet Take 1 tablet (4 mg total) by mouth every 8 (eight) hours as needed for nausea or vomiting. 20 tablet 0  . pantoprazole (PROTONIX)  40 MG tablet Take 1 tablet (40 mg total) by mouth at bedtime. 30 tablet 0  . prochlorperazine (COMPAZINE) 10 MG tablet Take 1 tablet (10 mg total) by mouth every 6 (six) hours as needed for nausea or vomiting. 30 tablet 0   No current facility-administered medications for this visit.     SURGICAL HISTORY:  Past Surgical History:  Procedure Laterality Date  . CARDIOVASCULAR STRESS TEST    . CORONARY ARTERY BYPASS GRAFT N/A 07/05/2012   Procedure: CORONARY ARTERY BYPASS GRAFTING (CABG);  Surgeon: Ivin Poot, MD;  Location: [facility];  Service: Open Heart Surgery;  Laterality: N/A;  . INTRAOPERATIVE TRANSESOPHAGEAL ECHOCARDIOGRAM  N/A 07/05/2012   Procedure: INTRAOPERATIVE TRANSESOPHAGEAL ECHOCARDIOGRAM;  Surgeon: Ivin Poot, MD;  Location: [facility];  Service: Open Heart Surgery;  Laterality: N/A;  . LEFT HEART CATH AND CORS/GRAFTS ANGIOGRAPHY N/A 11/14/2016   Procedure: LEFT HEART CATH AND CORS/GRAFTS ANGIOGRAPHY;  Surgeon: Belva Crome, MD;  Location: [facility];  Service: Cardiovascular;  Laterality: N/A;  . LEFT HEART CATHETERIZATION WITH CORONARY ANGIOGRAM N/A 07/04/2012   Procedure: LEFT HEART CATHETERIZATION WITH CORONARY ANGIOGRAM;  Surgeon: Wellington Hampshire, MD;  Location: [facility];  Service: Cardiovascular;  Laterality: N/A;  . RENAL BIOPSY    . ROBOT ASSISTED LAPAROSCOPIC NEPHRECTOMY Left 02/21/2017   Procedure: XI ROBOTIC ASSISTED LAPAROSCOPIC NEPHRECTOMY;  Surgeon: Alexis Frock, MD;  Location: [facility];  Service: Urology;  Laterality: Left;    REVIEW OF SYSTEMS:  Constitutional: positive for fatigue Eyes: negative Ears, nose, mouth, throat, and face: negative Respiratory: negative Cardiovascular: negative Gastrointestinal: negative Genitourinary:negative Integument/breast: negative Hematologic/lymphatic: negative Musculoskeletal:negative Neurological: negative Behavioral/Psych: negative Endocrine: negative Allergic/Immunologic: negative   PHYSICAL EXAMINATION: General appearance: alert, cooperative, fatigued and no distress Head: Normocephalic, without obvious abnormality, atraumatic Neck: no adenopathy, no JVD, supple, symmetrical, trachea midline and thyroid not enlarged, symmetric, no tenderness/mass/nodules Lymph nodes: Cervical, supraclavicular, and axillary nodes normal. Resp: clear to auscultation bilaterally Back: symmetric, no curvature. ROM normal. No CVA tenderness. Cardio: regular rate and rhythm, S1, S2 normal, no murmur, click, rub or gallop GI: soft, non-tender; bowel sounds normal; no masses,  no organomegaly Extremities: extremities normal, atraumatic, no cyanosis or  edema Neurologic: Alert and oriented X 3, normal strength and tone. Normal symmetric reflexes. Normal coordination and gait  ECOG PERFORMANCE STATUS: 1 - Symptomatic but completely ambulatory  Blood pressure 133/86, pulse 88, temperature 98.1 F (36.7 C), temperature source Oral, resp. rate 18, height 5\' 11"  (1.803 m), weight 178 lb 11.2 oz (81.1 kg), SpO2 100 %.  LABORATORY DATA: Lab Results  Component Value Date   WBC 8.4 11/01/2017   HGB 10.2 (L) 11/01/2017   HCT 30.2 (L) 11/01/2017   MCV 92.8 11/01/2017   PLT 157 11/01/2017      Chemistry      Component Value Date/Time   NA 141 11/01/2017 0813   NA 141 03/22/2017 0919   K 5.2 (H) 11/01/2017 0813   K 5.5 (H) 03/22/2017 0919   CL 107 11/01/2017 0813   CO2 23 11/01/2017 0813   CO2 27 03/22/2017 0919   BUN 24 (H) 11/01/2017 0813   BUN 18.1 03/22/2017 0919   CREATININE 1.61 (H) 11/01/2017 0813   CREATININE 1.2 03/22/2017 0919      Component Value Date/Time   CALCIUM 9.4 11/01/2017 0813   CALCIUM 9.8 03/22/2017 0919   ALKPHOS 100 11/01/2017 0813   ALKPHOS 103 03/22/2017 0919   AST 18 11/01/2017 0813   AST  15 03/22/2017 0919   ALT 17 11/01/2017 0813   ALT 13 03/22/2017 0919   BILITOT 0.4 11/01/2017 0813   BILITOT 0.41 03/22/2017 0919       RADIOGRAPHIC STUDIES: Ct Abdomen Pelvis Wo Contrast  Result Date: 10/31/2017 CLINICAL DATA:  Left renal cancer, diagnosed 2018, status post nephrectomy. Oral chemotherapy ongoing. EXAM: CT CHEST, ABDOMEN AND PELVIS WITHOUT CONTRAST TECHNIQUE: Multidetector CT imaging of the chest, abdomen and pelvis was performed following the standard protocol without IV contrast. COMPARISON:  07/02/2017 FINDINGS: CT CHEST FINDINGS Cardiovascular: Heart is normal in size.  No pericardial effusion. No evidence of thoracic aortic aneurysm. Atherosclerotic calcifications of the aortic arch. Three vessel coronary atherosclerosis. Postsurgical changes related to prior CABG. Mediastinum/Nodes: No  suspicious mediastinal or axillary lymphadenopathy. Visualized thyroid is unremarkable. Lungs/Pleura: Bilateral pulmonary nodules/metastases are improved. For example, an index 8 mm nodule in the right upper lobe (series 6/image 44) previously measured 12 mm. An index 7 mm nodule in the subpleural right upper lobe (series 6/image 58) previously measured 11 mm. A 4 mm nodule in the posterior left upper lobe (series 6/image 52) previously measured 6 mm. An 11 mm subpleural nodular opacity in the posterior right lower lobe (series 6/image 78) is unchanged. No new/suspicious pulmonary nodules. No focal consolidation. No pleural effusion or pneumothorax. Musculoskeletal: Mild degenerative changes of the mid/lower thoracic spine. Median sternotomy. CT ABDOMEN PELVIS FINDINGS Hepatobiliary: Unenhanced liver is unremarkable. Gallbladder is unremarkable. No intrahepatic or extrahepatic ductal dilatation. Pancreas: Within normal limits. Spleen: Within normal limits. Adrenals/Urinary Tract: Status post left adrenalectomy and left nephrectomy. No abnormal soft tissue in the surgical bed. Right adrenal glands within normal limits. Right kidney is unremarkable.  No renal calculi or hydronephrosis. Bladder is unremarkable. Stomach/Bowel: Stomach is within normal limits. No evidence of bowel obstruction. Appendix is not discretely visualized. Vascular/Lymphatic: No evidence of abdominal aortic aneurysm. Atherosclerotic calcifications of the abdominal aorta and branch vessels. No suspicious abdominopelvic lymphadenopathy. Reproductive: Mild prostatomegaly. Other: No abdominopelvic ascites. Musculoskeletal: Degenerative changes of the lumbar spine. IMPRESSION: Status post left adrenalectomy and left nephrectomy. Bilateral pulmonary nodules/metastases are mildly improved, with index lesions as above. No evidence of metastatic disease in the abdomen/pelvis. Electronically Signed   By: Julian Hy M.D.   On: 10/31/2017 15:52    Dg Chest 2 View  Result Date: 10/03/2017 CLINICAL DATA:  Patient with history of kidney cancer (10.2018), receiving infusion treatments, presents with complaints of dizziness, especially when standing. Patient reports he has felt this way before and was concerned he was having another .*comment was truncated* EXAM: CHEST - 2 VIEW COMPARISON:  07/02/2017 CT FINDINGS: Prior CABG. Low lung volumes are present, causing crowding of the pulmonary vasculature. Heart size within normal limits for projection. The pulmonary nodule shown on prior chest CT are not readily apparent on chest radiography. Atherosclerotic calcification of the aortic arch. IMPRESSION: 1. No active cardiopulmonary disease is radiographically apparent. The patient has known pulmonary nodules on prior chest CT are not highly apparent on today's chest radiography. 2.  Aortic Atherosclerosis (ICD10-I70.0). Electronically Signed   By: Van Clines M.D.   On: 10/03/2017 16:14   Ct Chest Wo Contrast  Result Date: 10/31/2017 CLINICAL DATA:  Left renal cancer, diagnosed 2018, status post nephrectomy. Oral chemotherapy ongoing. EXAM: CT CHEST, ABDOMEN AND PELVIS WITHOUT CONTRAST TECHNIQUE: Multidetector CT imaging of the chest, abdomen and pelvis was performed following the standard protocol without IV contrast. COMPARISON:  07/02/2017 FINDINGS: CT CHEST FINDINGS Cardiovascular: Heart is normal in size.  No pericardial effusion. No evidence of thoracic aortic aneurysm. Atherosclerotic calcifications of the aortic arch. Three vessel coronary atherosclerosis. Postsurgical changes related to prior CABG. Mediastinum/Nodes: No suspicious mediastinal or axillary lymphadenopathy. Visualized thyroid is unremarkable. Lungs/Pleura: Bilateral pulmonary nodules/metastases are improved. For example, an index 8 mm nodule in the right upper lobe (series 6/image 44) previously measured 12 mm. An index 7 mm nodule in the subpleural right upper lobe (series  6/image 58) previously measured 11 mm. A 4 mm nodule in the posterior left upper lobe (series 6/image 52) previously measured 6 mm. An 11 mm subpleural nodular opacity in the posterior right lower lobe (series 6/image 78) is unchanged. No new/suspicious pulmonary nodules. No focal consolidation. No pleural effusion or pneumothorax. Musculoskeletal: Mild degenerative changes of the mid/lower thoracic spine. Median sternotomy. CT ABDOMEN PELVIS FINDINGS Hepatobiliary: Unenhanced liver is unremarkable. Gallbladder is unremarkable. No intrahepatic or extrahepatic ductal dilatation. Pancreas: Within normal limits. Spleen: Within normal limits. Adrenals/Urinary Tract: Status post left adrenalectomy and left nephrectomy. No abnormal soft tissue in the surgical bed. Right adrenal glands within normal limits. Right kidney is unremarkable.  No renal calculi or hydronephrosis. Bladder is unremarkable. Stomach/Bowel: Stomach is within normal limits. No evidence of bowel obstruction. Appendix is not discretely visualized. Vascular/Lymphatic: No evidence of abdominal aortic aneurysm. Atherosclerotic calcifications of the abdominal aorta and branch vessels. No suspicious abdominopelvic lymphadenopathy. Reproductive: Mild prostatomegaly. Other: No abdominopelvic ascites. Musculoskeletal: Degenerative changes of the lumbar spine. IMPRESSION: Status post left adrenalectomy and left nephrectomy. Bilateral pulmonary nodules/metastases are mildly improved, with index lesions as above. No evidence of metastatic disease in the abdomen/pelvis. Electronically Signed   By: Julian Hy M.D.   On: 10/31/2017 15:52    ASSESSMENT AND PLAN: This is a very pleasant 72 years old white male diagnosed with metastatic renal cell carcinoma, chromophobe type presented with large left kidney mass as well as multiple bilateral pulmonary nodules.  He is status post left nephrectomy on February 21, 2017.  The patient is currently on treatment with  Cabometyx initially at a dose of 60 mg p.o. daily but he has a rough time tolerating this treatment with several episodes of diarrhea as well as nausea. His treatment was switched to Cabometyx at a dose of 40 mg p.o. Daily status post 2 months of treatment.  He has a stable disease but the treatment was discontinued secondary to intolerance.   He was started on treatment with immunotherapy with Nivolumab 480 mg IV every 4 weeks status post 3 cycles.   The patient continues to tolerate this treatment well with no concerning complaints. He had repeat CT scan of the chest, abdomen and pelvis performed recently.  I personally and independently reviewed the scans and discussed the results with the patient today.  His a scan showed no concerning findings for disease progression and there was mild improvement in the bilateral pulmonary nodules. I recommended for the patient to continue his current treatment with Nivolumab and he will proceed with cycle #4 today. He will come back for follow-up visit in 4 weeks for evaluation before starting cycle #5. For hypertension the patient was advised to continue his blood pressure medications and to monitor it closely at home. The patient was advised to call immediately if he has any concerning symptoms in the interval. The patient voices understanding of current disease status and treatment options and is in agreement with the current care plan. All questions were answered. The patient knows to call the clinic with any problems, questions  or concerns. We can certainly see the patient much sooner if necessary.  Disclaimer: This note was dictated with voice recognition software. Similar sounding words can inadvertently be transcribed and may not be corrected upon review.

## 2017-11-01 NOTE — Progress Notes (Signed)
Per Dr Julien Nordmann it is okay to treat pt today with Nivolumab and creatinine level.

## 2017-11-29 ENCOUNTER — Inpatient Hospital Stay: Payer: Medicare Other | Attending: Internal Medicine

## 2017-11-29 ENCOUNTER — Telehealth: Payer: Self-pay | Admitting: Internal Medicine

## 2017-11-29 ENCOUNTER — Inpatient Hospital Stay: Payer: Medicare Other

## 2017-11-29 ENCOUNTER — Encounter: Payer: Self-pay | Admitting: Internal Medicine

## 2017-11-29 ENCOUNTER — Inpatient Hospital Stay (HOSPITAL_BASED_OUTPATIENT_CLINIC_OR_DEPARTMENT_OTHER): Payer: Medicare Other | Admitting: Internal Medicine

## 2017-11-29 VITALS — BP 106/61 | HR 90 | Temp 97.9°F | Resp 18 | Ht 71.0 in | Wt 181.0 lb

## 2017-11-29 DIAGNOSIS — I5022 Chronic systolic (congestive) heart failure: Secondary | ICD-10-CM | POA: Insufficient documentation

## 2017-11-29 DIAGNOSIS — Z905 Acquired absence of kidney: Secondary | ICD-10-CM | POA: Diagnosis not present

## 2017-11-29 DIAGNOSIS — D509 Iron deficiency anemia, unspecified: Secondary | ICD-10-CM

## 2017-11-29 DIAGNOSIS — E119 Type 2 diabetes mellitus without complications: Secondary | ICD-10-CM | POA: Diagnosis not present

## 2017-11-29 DIAGNOSIS — Z5112 Encounter for antineoplastic immunotherapy: Secondary | ICD-10-CM

## 2017-11-29 DIAGNOSIS — Z8673 Personal history of transient ischemic attack (TIA), and cerebral infarction without residual deficits: Secondary | ICD-10-CM | POA: Insufficient documentation

## 2017-11-29 DIAGNOSIS — I340 Nonrheumatic mitral (valve) insufficiency: Secondary | ICD-10-CM | POA: Insufficient documentation

## 2017-11-29 DIAGNOSIS — M1990 Unspecified osteoarthritis, unspecified site: Secondary | ICD-10-CM | POA: Insufficient documentation

## 2017-11-29 DIAGNOSIS — Z7984 Long term (current) use of oral hypoglycemic drugs: Secondary | ICD-10-CM | POA: Diagnosis not present

## 2017-11-29 DIAGNOSIS — I2510 Atherosclerotic heart disease of native coronary artery without angina pectoris: Secondary | ICD-10-CM

## 2017-11-29 DIAGNOSIS — I739 Peripheral vascular disease, unspecified: Secondary | ICD-10-CM | POA: Insufficient documentation

## 2017-11-29 DIAGNOSIS — I252 Old myocardial infarction: Secondary | ICD-10-CM

## 2017-11-29 DIAGNOSIS — N400 Enlarged prostate without lower urinary tract symptoms: Secondary | ICD-10-CM | POA: Insufficient documentation

## 2017-11-29 DIAGNOSIS — C7800 Secondary malignant neoplasm of unspecified lung: Secondary | ICD-10-CM

## 2017-11-29 DIAGNOSIS — I110 Hypertensive heart disease with heart failure: Secondary | ICD-10-CM | POA: Insufficient documentation

## 2017-11-29 DIAGNOSIS — I255 Ischemic cardiomyopathy: Secondary | ICD-10-CM | POA: Insufficient documentation

## 2017-11-29 DIAGNOSIS — Z7982 Long term (current) use of aspirin: Secondary | ICD-10-CM | POA: Diagnosis not present

## 2017-11-29 DIAGNOSIS — E785 Hyperlipidemia, unspecified: Secondary | ICD-10-CM | POA: Diagnosis not present

## 2017-11-29 DIAGNOSIS — Z79899 Other long term (current) drug therapy: Secondary | ICD-10-CM | POA: Diagnosis not present

## 2017-11-29 DIAGNOSIS — C642 Malignant neoplasm of left kidney, except renal pelvis: Secondary | ICD-10-CM | POA: Insufficient documentation

## 2017-11-29 DIAGNOSIS — D500 Iron deficiency anemia secondary to blood loss (chronic): Secondary | ICD-10-CM

## 2017-11-29 LAB — CBC WITH DIFFERENTIAL (CANCER CENTER ONLY)
BASOS ABS: 0 10*3/uL (ref 0.0–0.1)
BASOS PCT: 0 %
EOS PCT: 12 %
Eosinophils Absolute: 1 10*3/uL — ABNORMAL HIGH (ref 0.0–0.5)
HEMATOCRIT: 30.4 % — AB (ref 38.4–49.9)
Hemoglobin: 9.7 g/dL — ABNORMAL LOW (ref 13.0–17.1)
Lymphocytes Relative: 11 %
Lymphs Abs: 1 10*3/uL (ref 0.9–3.3)
MCH: 29.4 pg (ref 27.2–33.4)
MCHC: 31.9 g/dL — ABNORMAL LOW (ref 32.0–36.0)
MCV: 92.1 fL (ref 79.3–98.0)
MONO ABS: 0.9 10*3/uL (ref 0.1–0.9)
Monocytes Relative: 10 %
NEUTROS ABS: 6.1 10*3/uL (ref 1.5–6.5)
Neutrophils Relative %: 67 %
PLATELETS: 198 10*3/uL (ref 140–400)
RBC: 3.3 MIL/uL — ABNORMAL LOW (ref 4.20–5.82)
RDW: 14.8 % — AB (ref 11.0–14.6)
WBC Count: 9.1 10*3/uL (ref 4.0–10.3)

## 2017-11-29 LAB — CMP (CANCER CENTER ONLY)
ALBUMIN: 3.7 g/dL (ref 3.5–5.0)
ALK PHOS: 312 U/L — AB (ref 38–126)
ALT: 141 U/L — ABNORMAL HIGH (ref 0–44)
ANION GAP: 12 (ref 5–15)
AST: 102 U/L — AB (ref 15–41)
BUN: 23 mg/dL (ref 8–23)
CALCIUM: 9.8 mg/dL (ref 8.9–10.3)
CO2: 21 mmol/L — AB (ref 22–32)
Chloride: 107 mmol/L (ref 98–111)
Creatinine: 1.58 mg/dL — ABNORMAL HIGH (ref 0.61–1.24)
GFR, Est AFR Am: 49 mL/min — ABNORMAL LOW (ref >60)
GFR, Estimated: 42 mL/min — ABNORMAL LOW (ref >60)
GLUCOSE: 110 mg/dL — AB (ref 70–99)
POTASSIUM: 5.2 mmol/L — AB (ref 3.5–5.1)
Sodium: 140 mmol/L (ref 135–145)
TOTAL PROTEIN: 7.4 g/dL (ref 6.5–8.1)
Total Bilirubin: 0.6 mg/dL (ref 0.3–1.2)

## 2017-11-29 LAB — TSH: TSH: 1.2 u[IU]/mL (ref 0.320–4.118)

## 2017-11-29 MED ORDER — SODIUM CHLORIDE 0.9 % IV SOLN
480.0000 mg | Freq: Once | INTRAVENOUS | Status: AC
  Administered 2017-11-29: 480 mg via INTRAVENOUS
  Filled 2017-11-29: qty 48

## 2017-11-29 MED ORDER — SODIUM CHLORIDE 0.9 % IV SOLN
Freq: Once | INTRAVENOUS | Status: AC
  Administered 2017-11-29: 11:00:00 via INTRAVENOUS
  Filled 2017-11-29: qty 250

## 2017-11-29 NOTE — Patient Instructions (Signed)
[facility] Discharge Instructions for Patients Receiving Chemotherapy  Today you received the following chemotherapy agents:  Nivolumab  To help prevent nausea and vomiting after your treatment, we encourage you to take your nausea medication as prescribed.   If you develop nausea and vomiting that is not controlled by your nausea medication, call the clinic.   BELOW ARE SYMPTOMS THAT SHOULD BE REPORTED IMMEDIATELY:  *FEVER GREATER THAN 100.5 F  *CHILLS WITH OR WITHOUT FEVER  NAUSEA AND VOMITING THAT IS NOT CONTROLLED WITH YOUR NAUSEA MEDICATION  *UNUSUAL SHORTNESS OF BREATH  *UNUSUAL BRUISING OR BLEEDING  TENDERNESS IN MOUTH AND THROAT WITH OR WITHOUT PRESENCE OF ULCERS  *URINARY PROBLEMS  *BOWEL PROBLEMS  UNUSUAL RASH Items with * indicate a potential emergency and should be followed up as soon as possible.  Feel free to call the clinic should you have any questions or concerns. The clinic phone number is [phone].  Please show the CHEMO ALERT CARD at check-in to the Emergency Department and triage nurse.   

## 2017-11-29 NOTE — Progress Notes (Signed)
Pt saw Dr. Julien Nordmann today prior to chemo.  MD aware of all lab results.  Proceed with chemo as per Dr. Julien Nordmann.

## 2017-11-29 NOTE — Progress Notes (Signed)
[name] [phone]   [phone]  OFFICE PROGRESS NOTE  Via, [name], MD [place] [address]  DIAGNOSIS: [name].The patient presented with an11 cm left kidney mass and pulmonary nodules.  PRIOR THERAPY: 1) Status post left nephrectomy on 02/21/2017. 20 Cabometyx 60 mg daily. Started 01/21/2017.Status post 1 month of treatment. Treatment was then placed on hold for nephrectomy. Resuming treatment on 03/22/2017.  Status post 3 weeks of treatment.  The patient was advised to hold Cabometyx starting on 04/11/2017.  This dose will be changed to 40 mg p.o. daily.This was a started May 12, 2017.  Status post 2 months of treatment discontinued secondary to intolerance.   CURRENT THERAPY: Immunotherapy with Nivolumab 418 mg IV every 4 weeks status post 4 cycles.  INTERVAL HISTORY: Trevor [name] 72 y.o. male returns to the clinic today for follow-up visit.  The patient is feeling fine today with no concerning complaints.  He denied having any fatigue or weakness.  He denied having any chest pain, shortness of breath, cough or hemoptysis.  He denied having any weight loss or night sweats.  He has no nausea, vomiting, diarrhea or constipation.  He continues to tolerate his treatment with Nivolumab fairly well.  He is here for evaluation before starting cycle #5.  MEDICAL HISTORY: Past Medical History:  Diagnosis Date  . 3-vessel coronary artery disease   . Anemia    2 iron infusions  . Arthritis   . Carotid disease, bilateral (HCC)    moderate  . Chronic systolic heart failure (HCC)    EF 45%  . Coronary artery disease    06/2012:STEMI s/p CABG; 8/18 NSTEMI  . Diabetes mellitus without complication ([facility])   . Diarrhea   . Dyspnea    mild  . History of blood transfusion   . Hyperlipidemia   . Ischemic cardiomyopathy    Ejection fraction of 35-40% initially, 45 - 50% on echo 2014, 53% by  perfusion study 2016  . Mild mitral regurgitation   . Numbness    Right side since stroke  . OA (osteoarthritis)   . PAD (peripheral artery disease) ([facility])   . Pulmonary nodule   . Renal cell cancer, left ([facility]) 12/2016   Associated w/ pulmonary nodules  . Stroke ([facility]) 2018  . TIA (transient ischemic attack) 10/2016  . Weakness generalized   . Wears glasses     ALLERGIES:  has No Known Allergies.  MEDICATIONS:  Current Outpatient Medications  Medication Sig Dispense Refill  . aspirin EC 81 MG tablet Take 81 mg by mouth daily.    Marland Kitchen atorvastatin (LIPITOR) 40 MG tablet Take 1 tablet (40 mg total) by mouth daily. 30 tablet 6  . carvedilol (COREG) 6.25 MG tablet Take 1 tablet (6.25 mg total) by mouth 2 (two) times daily. 180 tablet 1  . metFORMIN (GLUCOPHAGE) 1000 MG tablet Take 1 tablet (1,000 mg total) by mouth 2 (two) times daily.  0  . naproxen sodium (ALEVE) 220 MG tablet Take 440 mg by mouth daily as needed (arthritis).    . ondansetron (ZOFRAN ODT) 4 MG disintegrating tablet Take 1 tablet (4 mg total) by mouth every 8 (eight) hours as needed for nausea or vomiting. (Patient not taking: Reported on 11/01/2017) 20 tablet 0  . pantoprazole (PROTONIX) 40 MG tablet Take 1 tablet (40 mg total) by mouth at bedtime. 30 tablet 0  . prochlorperazine (COMPAZINE) 10 MG tablet  Take 1 tablet (10 mg total) by mouth every 6 (six) hours as needed for nausea or vomiting. (Patient not taking: Reported on 11/01/2017) 30 tablet 0   No current facility-administered medications for this visit.     SURGICAL HISTORY:  Past Surgical History:  Procedure Laterality Date  . CARDIOVASCULAR STRESS TEST    . CORONARY ARTERY BYPASS GRAFT N/A 07/05/2012   Procedure: CORONARY ARTERY BYPASS GRAFTING (CABG);  Surgeon: Ivin Poot, MD;  Location: [facility];  Service: Open Heart Surgery;  Laterality: N/A;  . INTRAOPERATIVE TRANSESOPHAGEAL ECHOCARDIOGRAM N/A 07/05/2012   Procedure: INTRAOPERATIVE TRANSESOPHAGEAL  ECHOCARDIOGRAM;  Surgeon: Ivin Poot, MD;  Location: [facility];  Service: Open Heart Surgery;  Laterality: N/A;  . LEFT HEART CATH AND CORS/GRAFTS ANGIOGRAPHY N/A 11/14/2016   Procedure: LEFT HEART CATH AND CORS/GRAFTS ANGIOGRAPHY;  Surgeon: Belva Crome, MD;  Location: [facility];  Service: Cardiovascular;  Laterality: N/A;  . LEFT HEART CATHETERIZATION WITH CORONARY ANGIOGRAM N/A 07/04/2012   Procedure: LEFT HEART CATHETERIZATION WITH CORONARY ANGIOGRAM;  Surgeon: Wellington Hampshire, MD;  Location: [facility];  Service: Cardiovascular;  Laterality: N/A;  . RENAL BIOPSY    . ROBOT ASSISTED LAPAROSCOPIC NEPHRECTOMY Left 02/21/2017   Procedure: XI ROBOTIC ASSISTED LAPAROSCOPIC NEPHRECTOMY;  Surgeon: Alexis Frock, MD;  Location: [facility];  Service: Urology;  Laterality: Left;    REVIEW OF SYSTEMS:  A comprehensive review of systems was negative.   PHYSICAL EXAMINATION: General appearance: alert, cooperative and no distress Head: Normocephalic, without obvious abnormality, atraumatic Neck: no adenopathy, no JVD, supple, symmetrical, trachea midline and thyroid not enlarged, symmetric, no tenderness/mass/nodules Lymph nodes: Cervical, supraclavicular, and axillary nodes normal. Resp: clear to auscultation bilaterally Back: symmetric, no curvature. ROM normal. No CVA tenderness. Cardio: regular rate and rhythm, S1, S2 normal, no murmur, click, rub or gallop GI: soft, non-tender; bowel sounds normal; no masses,  no organomegaly Extremities: extremities normal, atraumatic, no cyanosis or edema  ECOG PERFORMANCE STATUS: 1 - Symptomatic but completely ambulatory  Blood pressure 106/61, pulse 90, temperature 97.9 F (36.6 C), temperature source Oral, resp. rate 18, height 5\' 11"  (1.803 m), weight 181 lb (82.1 kg), SpO2 100 %.  LABORATORY DATA: Lab Results  Component Value Date   WBC 9.1 11/29/2017   HGB 9.7 (L) 11/29/2017   HCT 30.4 (L) 11/29/2017   MCV 92.1 11/29/2017   PLT 198  11/29/2017      Chemistry      Component Value Date/Time   NA 140 11/29/2017 0831   NA 141 03/22/2017 0919   K 5.2 (H) 11/29/2017 0831   K 5.5 (H) 03/22/2017 0919   CL 107 11/29/2017 0831   CO2 21 (L) 11/29/2017 0831   CO2 27 03/22/2017 0919   BUN 23 11/29/2017 0831   BUN 18.1 03/22/2017 0919   CREATININE 1.58 (H) 11/29/2017 0831   CREATININE 1.2 03/22/2017 0919      Component Value Date/Time   CALCIUM 9.8 11/29/2017 0831   CALCIUM 9.8 03/22/2017 0919   ALKPHOS 312 (H) 11/29/2017 0831   ALKPHOS 103 03/22/2017 0919   AST 102 (H) 11/29/2017 0831   AST 15 03/22/2017 0919   ALT 141 (H) 11/29/2017 0831   ALT 13 03/22/2017 0919   BILITOT 0.6 11/29/2017 0831   BILITOT 0.41 03/22/2017 0919       RADIOGRAPHIC STUDIES: Ct Abdomen Pelvis Wo Contrast  Result Date: 10/31/2017 CLINICAL DATA:  Left renal cancer, diagnosed 2018, status post nephrectomy. Oral chemotherapy ongoing. EXAM: CT  CHEST, ABDOMEN AND PELVIS WITHOUT CONTRAST TECHNIQUE: Multidetector CT imaging of the chest, abdomen and pelvis was performed following the standard protocol without IV contrast. COMPARISON:  07/02/2017 FINDINGS: CT CHEST FINDINGS Cardiovascular: Heart is normal in size.  No pericardial effusion. No evidence of thoracic aortic aneurysm. Atherosclerotic calcifications of the aortic arch. Three vessel coronary atherosclerosis. Postsurgical changes related to prior CABG. Mediastinum/Nodes: No suspicious mediastinal or axillary lymphadenopathy. Visualized thyroid is unremarkable. Lungs/Pleura: Bilateral pulmonary nodules/metastases are improved. For example, an index 8 mm nodule in the right upper lobe (series 6/image 44) previously measured 12 mm. An index 7 mm nodule in the subpleural right upper lobe (series 6/image 58) previously measured 11 mm. A 4 mm nodule in the posterior left upper lobe (series 6/image 52) previously measured 6 mm. An 11 mm subpleural nodular opacity in the posterior right lower lobe  (series 6/image 78) is unchanged. No new/suspicious pulmonary nodules. No focal consolidation. No pleural effusion or pneumothorax. Musculoskeletal: Mild degenerative changes of the mid/lower thoracic spine. Median sternotomy. CT ABDOMEN PELVIS FINDINGS Hepatobiliary: Unenhanced liver is unremarkable. Gallbladder is unremarkable. No intrahepatic or extrahepatic ductal dilatation. Pancreas: Within normal limits. Spleen: Within normal limits. Adrenals/Urinary Tract: Status post left adrenalectomy and left nephrectomy. No abnormal soft tissue in the surgical bed. Right adrenal glands within normal limits. Right kidney is unremarkable.  No renal calculi or hydronephrosis. Bladder is unremarkable. Stomach/Bowel: Stomach is within normal limits. No evidence of bowel obstruction. Appendix is not discretely visualized. Vascular/Lymphatic: No evidence of abdominal aortic aneurysm. Atherosclerotic calcifications of the abdominal aorta and branch vessels. No suspicious abdominopelvic lymphadenopathy. Reproductive: Mild prostatomegaly. Other: No abdominopelvic ascites. Musculoskeletal: Degenerative changes of the lumbar spine. IMPRESSION: Status post left adrenalectomy and left nephrectomy. Bilateral pulmonary nodules/metastases are mildly improved, with index lesions as above. No evidence of metastatic disease in the abdomen/pelvis. Electronically Signed   By: Julian Hy M.D.   On: 10/31/2017 15:52   Ct Chest Wo Contrast  Result Date: 10/31/2017 CLINICAL DATA:  Left renal cancer, diagnosed 2018, status post nephrectomy. Oral chemotherapy ongoing. EXAM: CT CHEST, ABDOMEN AND PELVIS WITHOUT CONTRAST TECHNIQUE: Multidetector CT imaging of the chest, abdomen and pelvis was performed following the standard protocol without IV contrast. COMPARISON:  07/02/2017 FINDINGS: CT CHEST FINDINGS Cardiovascular: Heart is normal in size.  No pericardial effusion. No evidence of thoracic aortic aneurysm. Atherosclerotic  calcifications of the aortic arch. Three vessel coronary atherosclerosis. Postsurgical changes related to prior CABG. Mediastinum/Nodes: No suspicious mediastinal or axillary lymphadenopathy. Visualized thyroid is unremarkable. Lungs/Pleura: Bilateral pulmonary nodules/metastases are improved. For example, an index 8 mm nodule in the right upper lobe (series 6/image 44) previously measured 12 mm. An index 7 mm nodule in the subpleural right upper lobe (series 6/image 58) previously measured 11 mm. A 4 mm nodule in the posterior left upper lobe (series 6/image 52) previously measured 6 mm. An 11 mm subpleural nodular opacity in the posterior right lower lobe (series 6/image 78) is unchanged. No new/suspicious pulmonary nodules. No focal consolidation. No pleural effusion or pneumothorax. Musculoskeletal: Mild degenerative changes of the mid/lower thoracic spine. Median sternotomy. CT ABDOMEN PELVIS FINDINGS Hepatobiliary: Unenhanced liver is unremarkable. Gallbladder is unremarkable. No intrahepatic or extrahepatic ductal dilatation. Pancreas: Within normal limits. Spleen: Within normal limits. Adrenals/Urinary Tract: Status post left adrenalectomy and left nephrectomy. No abnormal soft tissue in the surgical bed. Right adrenal glands within normal limits. Right kidney is unremarkable.  No renal calculi or hydronephrosis. Bladder is unremarkable. Stomach/Bowel: Stomach is within normal limits. No  evidence of bowel obstruction. Appendix is not discretely visualized. Vascular/Lymphatic: No evidence of abdominal aortic aneurysm. Atherosclerotic calcifications of the abdominal aorta and branch vessels. No suspicious abdominopelvic lymphadenopathy. Reproductive: Mild prostatomegaly. Other: No abdominopelvic ascites. Musculoskeletal: Degenerative changes of the lumbar spine. IMPRESSION: Status post left adrenalectomy and left nephrectomy. Bilateral pulmonary nodules/metastases are mildly improved, with index lesions as  above. No evidence of metastatic disease in the abdomen/pelvis. Electronically Signed   By: Julian Hy M.D.   On: 10/31/2017 15:52    ASSESSMENT AND PLAN: This is a very pleasant 72 years old white male diagnosed with metastatic renal cell carcinoma, chromophobe type presented with large left kidney mass as well as multiple bilateral pulmonary nodules.  He is status post left nephrectomy on February 21, 2017.  The patient is currently on treatment with Cabometyx initially at a dose of 60 mg p.o. daily but he has a rough time tolerating this treatment with several episodes of diarrhea as well as nausea. His treatment was switched to Cabometyx at a dose of 40 mg p.o. Daily status post 2 months of treatment.  He has a stable disease but the treatment was discontinued secondary to intolerance.   He was started on treatment with immunotherapy with Nivolumab 480 mg IV every 4 weeks status post 4 cycles.   He continues to tolerate the treatment well with no concerning adverse effects.  I recommended for him to proceed with cycle #5 today as a schedule. For the elevated liver enzymes, I would repeat his comprehensive metabolic panel next week. I will see the patient back for follow-up visit in 4 weeks for evaluation before starting cycle #6. For hypertension the patient was advised to continue his blood pressure medications and to monitor it closely at home. He was advised to call immediately if he has any concerning symptoms in the interval. The patient voices understanding of current disease status and treatment options and is in agreement with the current care plan. All questions were answered. The patient knows to call the clinic with any problems, questions or concerns. We can certainly see the patient much sooner if necessary.  Disclaimer: This note was dictated with voice recognition software. Similar sounding words can inadvertently be transcribed and may not be corrected upon review.

## 2017-11-29 NOTE — Telephone Encounter (Signed)
Scheduled appt per 9/12 los - gave patient AVS and calender per los.   

## 2017-12-06 ENCOUNTER — Inpatient Hospital Stay: Payer: Medicare Other

## 2017-12-06 DIAGNOSIS — C642 Malignant neoplasm of left kidney, except renal pelvis: Secondary | ICD-10-CM | POA: Diagnosis not present

## 2017-12-06 LAB — CMP (CANCER CENTER ONLY)
ALT: 164 U/L — AB (ref 0–44)
AST: 102 U/L — AB (ref 15–41)
Albumin: 3.4 g/dL — ABNORMAL LOW (ref 3.5–5.0)
Alkaline Phosphatase: 488 U/L — ABNORMAL HIGH (ref 38–126)
Anion gap: 11 (ref 5–15)
BILIRUBIN TOTAL: 1 mg/dL (ref 0.3–1.2)
BUN: 29 mg/dL — ABNORMAL HIGH (ref 8–23)
CALCIUM: 9.8 mg/dL (ref 8.9–10.3)
CO2: 23 mmol/L (ref 22–32)
CREATININE: 1.69 mg/dL — AB (ref 0.61–1.24)
Chloride: 107 mmol/L (ref 98–111)
GFR, EST AFRICAN AMERICAN: 45 mL/min — AB (ref >60)
GFR, EST NON AFRICAN AMERICAN: 39 mL/min — AB (ref >60)
Glucose, Bld: 119 mg/dL — ABNORMAL HIGH (ref 70–99)
Potassium: 5.1 mmol/L (ref 3.5–5.1)
Sodium: 141 mmol/L (ref 135–145)
Total Protein: 7.4 g/dL (ref 6.5–8.1)

## 2017-12-25 ENCOUNTER — Encounter (HOSPITAL_COMMUNITY): Payer: Medicare Other

## 2017-12-27 ENCOUNTER — Other Ambulatory Visit: Payer: Self-pay | Admitting: Internal Medicine

## 2017-12-27 ENCOUNTER — Inpatient Hospital Stay: Payer: Medicare Other | Attending: Internal Medicine

## 2017-12-27 ENCOUNTER — Telehealth: Payer: Self-pay | Admitting: Internal Medicine

## 2017-12-27 ENCOUNTER — Other Ambulatory Visit: Payer: Self-pay | Admitting: *Deleted

## 2017-12-27 ENCOUNTER — Inpatient Hospital Stay: Payer: Medicare Other | Admitting: Internal Medicine

## 2017-12-27 ENCOUNTER — Inpatient Hospital Stay: Payer: Medicare Other

## 2017-12-27 ENCOUNTER — Encounter: Payer: Self-pay | Admitting: Internal Medicine

## 2017-12-27 VITALS — BP 85/59 | HR 86 | Temp 98.6°F | Resp 18 | Ht 71.0 in | Wt 167.2 lb

## 2017-12-27 VITALS — BP 71/46 | HR 97

## 2017-12-27 DIAGNOSIS — Z905 Acquired absence of kidney: Secondary | ICD-10-CM | POA: Insufficient documentation

## 2017-12-27 DIAGNOSIS — Z8673 Personal history of transient ischemic attack (TIA), and cerebral infarction without residual deficits: Secondary | ICD-10-CM | POA: Insufficient documentation

## 2017-12-27 DIAGNOSIS — I509 Heart failure, unspecified: Secondary | ICD-10-CM | POA: Diagnosis not present

## 2017-12-27 DIAGNOSIS — R5383 Other fatigue: Secondary | ICD-10-CM | POA: Diagnosis not present

## 2017-12-27 DIAGNOSIS — Z9221 Personal history of antineoplastic chemotherapy: Secondary | ICD-10-CM

## 2017-12-27 DIAGNOSIS — I2510 Atherosclerotic heart disease of native coronary artery without angina pectoris: Secondary | ICD-10-CM

## 2017-12-27 DIAGNOSIS — I252 Old myocardial infarction: Secondary | ICD-10-CM | POA: Diagnosis not present

## 2017-12-27 DIAGNOSIS — Z7984 Long term (current) use of oral hypoglycemic drugs: Secondary | ICD-10-CM

## 2017-12-27 DIAGNOSIS — R200 Anesthesia of skin: Secondary | ICD-10-CM | POA: Insufficient documentation

## 2017-12-27 DIAGNOSIS — R918 Other nonspecific abnormal finding of lung field: Secondary | ICD-10-CM | POA: Insufficient documentation

## 2017-12-27 DIAGNOSIS — M1990 Unspecified osteoarthritis, unspecified site: Secondary | ICD-10-CM | POA: Insufficient documentation

## 2017-12-27 DIAGNOSIS — I255 Ischemic cardiomyopathy: Secondary | ICD-10-CM | POA: Insufficient documentation

## 2017-12-27 DIAGNOSIS — R197 Diarrhea, unspecified: Secondary | ICD-10-CM | POA: Insufficient documentation

## 2017-12-27 DIAGNOSIS — E119 Type 2 diabetes mellitus without complications: Secondary | ICD-10-CM

## 2017-12-27 DIAGNOSIS — C642 Malignant neoplasm of left kidney, except renal pelvis: Secondary | ICD-10-CM

## 2017-12-27 DIAGNOSIS — E785 Hyperlipidemia, unspecified: Secondary | ICD-10-CM | POA: Insufficient documentation

## 2017-12-27 DIAGNOSIS — Z7982 Long term (current) use of aspirin: Secondary | ICD-10-CM | POA: Insufficient documentation

## 2017-12-27 DIAGNOSIS — Z5112 Encounter for antineoplastic immunotherapy: Secondary | ICD-10-CM

## 2017-12-27 DIAGNOSIS — E860 Dehydration: Secondary | ICD-10-CM

## 2017-12-27 DIAGNOSIS — I110 Hypertensive heart disease with heart failure: Secondary | ICD-10-CM | POA: Insufficient documentation

## 2017-12-27 DIAGNOSIS — R531 Weakness: Secondary | ICD-10-CM | POA: Diagnosis not present

## 2017-12-27 DIAGNOSIS — M129 Arthropathy, unspecified: Secondary | ICD-10-CM

## 2017-12-27 LAB — CBC WITH DIFFERENTIAL (CANCER CENTER ONLY)
Abs Immature Granulocytes: 0.05 10*3/uL (ref 0.00–0.07)
BASOS ABS: 0 10*3/uL (ref 0.0–0.1)
Basophils Relative: 0 %
EOS ABS: 0.1 10*3/uL (ref 0.0–0.5)
EOS PCT: 1 %
HCT: 30.8 % — ABNORMAL LOW (ref 39.0–52.0)
Hemoglobin: 9.8 g/dL — ABNORMAL LOW (ref 13.0–17.0)
Immature Granulocytes: 1 %
LYMPHS PCT: 10 %
Lymphs Abs: 1 10*3/uL (ref 0.7–4.0)
MCH: 27.4 pg (ref 26.0–34.0)
MCHC: 31.8 g/dL (ref 30.0–36.0)
MCV: 86 fL (ref 80.0–100.0)
Monocytes Absolute: 0.9 10*3/uL (ref 0.1–1.0)
Monocytes Relative: 9 %
NRBC: 0 % (ref 0.0–0.2)
Neutro Abs: 7.8 10*3/uL — ABNORMAL HIGH (ref 1.7–7.7)
Neutrophils Relative %: 79 %
Platelet Count: 250 10*3/uL (ref 150–400)
RBC: 3.58 MIL/uL — ABNORMAL LOW (ref 4.22–5.81)
RDW: 17 % — AB (ref 11.5–15.5)
WBC Count: 9.8 10*3/uL (ref 4.0–10.5)

## 2017-12-27 LAB — CMP (CANCER CENTER ONLY)
ALBUMIN: 2.6 g/dL — AB (ref 3.5–5.0)
ALK PHOS: 1009 U/L — AB (ref 38–126)
ALT: 584 U/L (ref 0–44)
ANION GAP: 15 (ref 5–15)
AST: 672 U/L (ref 15–41)
BILIRUBIN TOTAL: 6.4 mg/dL — AB (ref 0.3–1.2)
BUN: 25 mg/dL — ABNORMAL HIGH (ref 8–23)
CALCIUM: 9.4 mg/dL (ref 8.9–10.3)
CO2: 18 mmol/L — AB (ref 22–32)
Chloride: 101 mmol/L (ref 98–111)
Creatinine: 1.68 mg/dL — ABNORMAL HIGH (ref 0.61–1.24)
GFR, EST AFRICAN AMERICAN: 46 mL/min — AB (ref >60)
GFR, Estimated: 39 mL/min — ABNORMAL LOW (ref >60)
GLUCOSE: 164 mg/dL — AB (ref 70–99)
POTASSIUM: 4.6 mmol/L (ref 3.5–5.1)
SODIUM: 134 mmol/L — AB (ref 135–145)
TOTAL PROTEIN: 6.8 g/dL (ref 6.5–8.1)

## 2017-12-27 LAB — TSH: TSH: 0.425 u[IU]/mL (ref 0.320–4.118)

## 2017-12-27 MED ORDER — MIRTAZAPINE 15 MG PO TABS: 30.0000 mg | ORAL_TABLET | Freq: Every day | ORAL | 1 refills | Status: DC

## 2017-12-27 MED ORDER — SODIUM CHLORIDE 0.9 % IV SOLN
1000.0000 mL | INTRAVENOUS | Status: AC
  Filled 2017-12-27: qty 1000

## 2017-12-27 MED ORDER — SODIUM CHLORIDE 0.9 % IV SOLN
INTRAVENOUS | Status: AC
  Administered 2017-12-27: 10:00:00 via INTRAVENOUS
  Filled 2017-12-27 (×2): qty 250

## 2017-12-27 MED ORDER — PREDNISONE 50 MG PO TABS: ORAL_TABLET | ORAL | 0 refills | Status: DC

## 2017-12-27 NOTE — Patient Instructions (Addendum)
Dehydration, Adult Dehydration is when there is not enough fluid or water in your body. This happens when you lose more fluids than you take in. Dehydration can range from mild to very bad. It should be treated right away to keep it from getting very bad. Symptoms of mild dehydration may include:  Thirst.  Dry lips.  Slightly dry mouth.  Dry, warm skin.  Dizziness. Symptoms of moderate dehydration may include:  Very dry mouth.  Muscle cramps.  Dark pee (urine). Pee may be the color of tea.  Your body making less pee.  Your eyes making fewer tears.  Heartbeat that is uneven or faster than normal (palpitations).  Headache.  Light-headedness, especially when you stand up from sitting.  Fainting (syncope). Symptoms of very bad dehydration may include:  Changes in skin, such as: ? Cold and clammy skin. ? Blotchy (mottled) or pale skin. ? Skin that does not quickly return to normal after being lightly pinched and let go (poor skin turgor).  Changes in body fluids, such as: ? Feeling very thirsty. ? Your eyes making fewer tears. ? Not sweating when body temperature is high, such as in hot weather. ? Your body making very little pee.  Changes in vital signs, such as: ? Weak pulse. ? Pulse that is more than 100 beats a minute when you are sitting still. ? Fast breathing. ? Low blood pressure.  Other changes, such as: ? Sunken eyes. ? Cold hands and feet. ? Confusion. ? Lack of energy (lethargy). ? Trouble waking up from sleep. ? Short-term weight loss. ? Unconsciousness. Follow these instructions at home:  If told by your doctor, drink an ORS: ? Make an ORS by using instructions on the package. ? Start by drinking small amounts, about  cup (120 mL) every 5-10 minutes. ? Slowly drink more until you have had the amount that your doctor said to have.  Drink enough clear fluid to keep your pee clear or pale yellow. If you were told to drink an ORS, finish the ORS  first, then start slowly drinking clear fluids. Drink fluids such as: ? Water. Do not drink only water by itself. Doing that can make the salt (sodium) level in your body get too low (hyponatremia). ? Ice chips. ? Fruit juice that you have added water to (diluted). ? Low-calorie sports drinks.  Avoid: ? Alcohol. ? Drinks that have a lot of sugar. These include high-calorie sports drinks, fruit juice that does not have water added, and soda. ? Caffeine. ? Foods that are greasy or have a lot of fat or sugar.  Take over-the-counter and prescription medicines only as told by your doctor.  Do not take salt tablets. Doing that can make the salt level in your body get too high (hypernatremia).  Eat foods that have minerals (electrolytes). Examples include bananas, oranges, potatoes, tomatoes, and spinach.  Keep all follow-up visits as told by your doctor. This is important. Contact a doctor if:  You have belly (abdominal) pain that: ? Gets worse. ? Stays in one area (localizes).  You have a rash.  You have a stiff neck.  You get angry or annoyed more easily than normal (irritability).  You are more sleepy than normal.  You have a harder time waking up than normal.  You feel: ? Weak. ? Dizzy. ? Very thirsty.  You have peed (urinated) only a small amount of very dark pee during 6-8 hours. Get help right away if:  You have symptoms of   very bad dehydration.  You cannot drink fluids without throwing up (vomiting).  Your symptoms get worse with treatment.  You have a fever.  You have a very bad headache.  You are throwing up or having watery poop (diarrhea) and it: ? Gets worse. ? Does not go away.  You have blood or something green (bile) in your throw-up.  You have blood in your poop (stool). This may cause poop to look black and tarry.  You have not peed in 6-8 hours.  You pass out (faint).  Your heart rate when you are sitting still is more than 100 beats a  minute.  You have trouble breathing. This information is not intended to replace advice given to you by your health care provider. Make sure you discuss any questions you have with your health care provider. Document Released: 12/31/2008 Document Revised: 09/24/2015 Document Reviewed: 04/30/2015 Elsevier Interactive Patient Education  2018 [employer]  

## 2017-12-27 NOTE — Progress Notes (Signed)
[facility] Telephone:(336) [phone]   Fax:([phone]  OFFICE PROGRESS NOTE  Via, [name], MD [place] [address]  DIAGNOSIS: [name].The patient presented with an11 cm left kidney mass and pulmonary nodules.  PRIOR THERAPY: 1) Status post left nephrectomy on 02/21/2017. 20 Cabometyx 60 mg daily. Started 01/21/2017.Status post 1 month of treatment. Treatment was then placed on hold for nephrectomy. Resuming treatment on 03/22/2017.  Status post 3 weeks of treatment.  The patient was advised to hold Cabometyx starting on 04/11/2017.  This dose will be changed to 40 mg p.o. daily.This was a started May 12, 2017.  Status post 2 months of treatment discontinued secondary to intolerance.   CURRENT THERAPY: Immunotherapy with Nivolumab 418 mg IV every 4 weeks status post 5 cycles.  INTERVAL HISTORY: Trevor [name] 72 y.o. male returns to the clinic today for follow-up visit.  The patient is feeling fine today with no concerning complaints except for generalized weakness and fatigue.  He also has some difficulty with sleeping.  He has no appetite for the last few weeks and lost few pounds.  He denied having any chest pain, shortness breath, cough or hemoptysis.  He denied having any fever or chills.  He has no nausea, vomiting, diarrhea or constipation.  He is here today for evaluation before starting cycle #6 of his treatment.  MEDICAL HISTORY: Past Medical History:  Diagnosis Date  . 3-vessel coronary artery disease   . Anemia    2 iron infusions  . Arthritis   . Carotid disease, bilateral (HCC)    moderate  . Chronic systolic heart failure (HCC)    EF 45%  . Coronary artery disease    06/2012:STEMI s/p CABG; 8/18 NSTEMI  . Diabetes mellitus without complication ([facility])   . Diarrhea   . Dyspnea    mild  . History of blood transfusion   . Hyperlipidemia   . Ischemic cardiomyopathy    Ejection fraction of  35-40% initially, 45 - 50% on echo 2014, 53% by perfusion study 2016  . Mild mitral regurgitation   . Numbness    Right side since stroke  . OA (osteoarthritis)   . PAD (peripheral artery disease) ([facility])   . Pulmonary nodule   . Renal cell cancer, left ([facility]) 12/2016   Associated w/ pulmonary nodules  . Stroke ([facility]) 2018  . TIA (transient ischemic attack) 10/2016  . Weakness generalized   . Wears glasses     ALLERGIES:  has No Known Allergies.  MEDICATIONS:  Current Outpatient Medications  Medication Sig Dispense Refill  . aspirin EC 81 MG tablet Take 81 mg by mouth daily.    Marland Kitchen atorvastatin (LIPITOR) 40 MG tablet Take 1 tablet (40 mg total) by mouth daily. 30 tablet 6  . carvedilol (COREG) 6.25 MG tablet Take 1 tablet (6.25 mg total) by mouth 2 (two) times daily. 180 tablet 1  . metFORMIN (GLUCOPHAGE) 1000 MG tablet Take 1 tablet (1,000 mg total) by mouth 2 (two) times daily.  0  . naproxen sodium (ALEVE) 220 MG tablet Take 440 mg by mouth daily as needed (arthritis).    . ondansetron (ZOFRAN ODT) 4 MG disintegrating tablet Take 1 tablet (4 mg total) by mouth every 8 (eight) hours as needed for nausea or vomiting. (Patient not taking: Reported on 11/01/2017) 20 tablet 0  . pantoprazole (PROTONIX) 40 MG tablet Take 1 tablet (40 mg total) by mouth at bedtime.  30 tablet 0  . prochlorperazine (COMPAZINE) 10 MG tablet Take 1 tablet (10 mg total) by mouth every 6 (six) hours as needed for nausea or vomiting. (Patient not taking: Reported on 11/01/2017) 30 tablet 0   No current facility-administered medications for this visit.     SURGICAL HISTORY:  Past Surgical History:  Procedure Laterality Date  . CARDIOVASCULAR STRESS TEST    . CORONARY ARTERY BYPASS GRAFT N/A 07/05/2012   Procedure: CORONARY ARTERY BYPASS GRAFTING (CABG);  Surgeon: Ivin Poot, MD;  Location: [facility];  Service: Open Heart Surgery;  Laterality: N/A;  . INTRAOPERATIVE TRANSESOPHAGEAL ECHOCARDIOGRAM N/A 07/05/2012    Procedure: INTRAOPERATIVE TRANSESOPHAGEAL ECHOCARDIOGRAM;  Surgeon: Ivin Poot, MD;  Location: [facility];  Service: Open Heart Surgery;  Laterality: N/A;  . LEFT HEART CATH AND CORS/GRAFTS ANGIOGRAPHY N/A 11/14/2016   Procedure: LEFT HEART CATH AND CORS/GRAFTS ANGIOGRAPHY;  Surgeon: Belva Crome, MD;  Location: [facility];  Service: Cardiovascular;  Laterality: N/A;  . LEFT HEART CATHETERIZATION WITH CORONARY ANGIOGRAM N/A 07/04/2012   Procedure: LEFT HEART CATHETERIZATION WITH CORONARY ANGIOGRAM;  Surgeon: Wellington Hampshire, MD;  Location: [facility];  Service: Cardiovascular;  Laterality: N/A;  . RENAL BIOPSY    . ROBOT ASSISTED LAPAROSCOPIC NEPHRECTOMY Left 02/21/2017   Procedure: XI ROBOTIC ASSISTED LAPAROSCOPIC NEPHRECTOMY;  Surgeon: Alexis Frock, MD;  Location: [facility];  Service: Urology;  Laterality: Left;    REVIEW OF SYSTEMS:  A comprehensive review of systems was negative except for: Constitutional: positive for anorexia, fatigue and weight loss Behavioral/Psych: positive for sleep disturbance   PHYSICAL EXAMINATION: General appearance: alert, cooperative, fatigued and no distress Head: Normocephalic, without obvious abnormality, atraumatic Neck: no adenopathy, no JVD, supple, symmetrical, trachea midline and thyroid not enlarged, symmetric, no tenderness/mass/nodules Lymph nodes: Cervical, supraclavicular, and axillary nodes normal. Resp: clear to auscultation bilaterally Back: symmetric, no curvature. ROM normal. No CVA tenderness. Cardio: regular rate and rhythm, S1, S2 normal, no murmur, click, rub or gallop GI: soft, non-tender; bowel sounds normal; no masses,  no organomegaly Extremities: extremities normal, atraumatic, no cyanosis or edema  ECOG PERFORMANCE STATUS: 1 - Symptomatic but completely ambulatory  Blood pressure (!) 85/59, pulse 86, temperature 98.6 F (37 C), temperature source Oral, resp. rate 18, height 5\' 11"  (1.803 m), weight 167 lb 3.2 oz (75.8  kg), SpO2 100 %.  LABORATORY DATA: Lab Results  Component Value Date   WBC 9.8 12/27/2017   HGB 9.8 (L) 12/27/2017   HCT 30.8 (L) 12/27/2017   MCV 86.0 12/27/2017   PLT 250 12/27/2017      Chemistry      Component Value Date/Time   NA 141 12/06/2017 1033   NA 141 03/22/2017 0919   K 5.1 12/06/2017 1033   K 5.5 (H) 03/22/2017 0919   CL 107 12/06/2017 1033   CO2 23 12/06/2017 1033   CO2 27 03/22/2017 0919   BUN 29 (H) 12/06/2017 1033   BUN 18.1 03/22/2017 0919   CREATININE 1.69 (H) 12/06/2017 1033   CREATININE 1.2 03/22/2017 0919      Component Value Date/Time   CALCIUM 9.8 12/06/2017 1033   CALCIUM 9.8 03/22/2017 0919   ALKPHOS 488 (H) 12/06/2017 1033   ALKPHOS 103 03/22/2017 0919   AST 102 (H) 12/06/2017 1033   AST 15 03/22/2017 0919   ALT 164 (H) 12/06/2017 1033   ALT 13 03/22/2017 0919   BILITOT 1.0 12/06/2017 1033   BILITOT 0.41 03/22/2017 0919  RADIOGRAPHIC STUDIES: No results found.  ASSESSMENT AND PLAN: This is a very pleasant 72 years old white male diagnosed with metastatic renal cell carcinoma, chromophobe type presented with large left kidney mass as well as multiple bilateral pulmonary nodules.  He is status post left nephrectomy on February 21, 2017.  The patient is currently on treatment with Cabometyx initially at a dose of 60 mg p.o. daily but he has a rough time tolerating this treatment with several episodes of diarrhea as well as nausea. His treatment was switched to Cabometyx at a dose of 40 mg p.o. Daily status post 2 months of treatment.  He has a stable disease but the treatment was discontinued secondary to intolerance.   He was started on treatment with immunotherapy with Nivolumab 480 mg IV every 4 weeks status post 5 cycles.   The patient continues to tolerate this treatment well. I recommended for him to proceed with cycle #6 today. I will see the patient back for follow-up visit in 4 weeks for evaluation after repeating CT scan of the  chest, abdomen and pelvis for restaging of his disease. For the lack of appetite and depression, I will start the patient on Remeron 15 mg p.o. nightly. He was advised to call immediately if he has any concerning symptoms in the interval. The patient voices understanding of current disease status and treatment options and is in agreement with the current care plan. All questions were answered. The patient knows to call the clinic with any problems, questions or concerns. We can certainly see the patient much sooner if necessary.  Disclaimer: This note was dictated with voice recognition software. Similar sounding words can inadvertently be transcribed and may not be corrected upon review.

## 2017-12-27 NOTE — Telephone Encounter (Signed)
3 cycles already scheduled per 10/10 los - no additional appts added.   

## 2017-12-27 NOTE — Telephone Encounter (Signed)
Called regarding 10/16 MD said ok to the date and its call day

## 2017-12-27 NOTE — Progress Notes (Signed)
Went to lobby to bring pt back for treatment.  Pt dizzy and unsteady on his feet while walking back to treatment area.  Pt very dependent on assist from RN. BP upon arriving to treatment area was 82/52. Pt's assymptomatic when laying in bed.  Dr. Ewing Schlein notified and 1L of 0.9 NS ordered to be infused over 2 hours.  Pt BP after 1 L 0.9 saline infusion is 93/68, HR 74 (lying down).  Pt stood by side of bed and BP was retaken (71/46, HR 97). Dr. Julien Nordmann notified. Per Dr. Julien Nordmann, ok for pt to discharged home, but to advise pt to drink plenty of fluids.  As pt was planning on driving himself home, talked to Sandi Mealy, PA about concerns about pt driving himself home.  Tanner, PA agreed pt could go home, but that he would need to have someone pick him up.  Also recommended for pt to drink plenty of fluids.  Pt agreed to call sister and have her pick him up to drive him home.  Pt taken to lobby in wheelchair to wait for sister to arrive.

## 2017-12-31 ENCOUNTER — Other Ambulatory Visit: Payer: Self-pay | Admitting: Cardiovascular Disease

## 2017-12-31 ENCOUNTER — Ambulatory Visit (HOSPITAL_COMMUNITY)
Admission: RE | Admit: 2017-12-31 | Discharge: 2017-12-31 | Disposition: A | Discharge status: Home / Self Care | Payer: Medicare Other | Source: Ambulatory Visit | Attending: Cardiovascular Disease | Admitting: Cardiovascular Disease

## 2017-12-31 DIAGNOSIS — I6523 Occlusion and stenosis of bilateral carotid arteries: Secondary | ICD-10-CM | POA: Diagnosis present

## 2017-12-31 DIAGNOSIS — I779 Disorder of arteries and arterioles, unspecified: Secondary | ICD-10-CM

## 2017-12-31 DIAGNOSIS — I739 Peripheral vascular disease, unspecified: Secondary | ICD-10-CM | POA: Insufficient documentation

## 2018-01-01 ENCOUNTER — Telehealth: Payer: Self-pay | Admitting: *Deleted

## 2018-01-01 DIAGNOSIS — I6523 Occlusion and stenosis of bilateral carotid arteries: Secondary | ICD-10-CM

## 2018-01-01 NOTE — Telephone Encounter (Signed)
-----   Message from Wellington Hampshire, MD sent at 12/31/2017  4:07 PM EDT ----- Stable right carotid stenosis.  Repeat study in 1 year.

## 2018-01-01 NOTE — Telephone Encounter (Signed)
Patient made aware of results and verbalized understanding. Repeat orders placed.    

## 2018-01-02 ENCOUNTER — Encounter: Payer: Self-pay | Admitting: Internal Medicine

## 2018-01-02 ENCOUNTER — Inpatient Hospital Stay: Payer: Medicare Other

## 2018-01-02 ENCOUNTER — Inpatient Hospital Stay: Payer: Medicare Other | Admitting: Internal Medicine

## 2018-01-02 VITALS — BP 108/73 | HR 84 | Temp 98.1°F | Resp 18 | Ht 71.0 in | Wt 174.0 lb

## 2018-01-02 DIAGNOSIS — M1990 Unspecified osteoarthritis, unspecified site: Secondary | ICD-10-CM

## 2018-01-02 DIAGNOSIS — R200 Anesthesia of skin: Secondary | ICD-10-CM

## 2018-01-02 DIAGNOSIS — Z5112 Encounter for antineoplastic immunotherapy: Secondary | ICD-10-CM

## 2018-01-02 DIAGNOSIS — T50905A Adverse effect of unspecified drugs, medicaments and biological substances, initial encounter: Secondary | ICD-10-CM

## 2018-01-02 DIAGNOSIS — E785 Hyperlipidemia, unspecified: Secondary | ICD-10-CM

## 2018-01-02 DIAGNOSIS — Z9221 Personal history of antineoplastic chemotherapy: Secondary | ICD-10-CM

## 2018-01-02 DIAGNOSIS — I10 Essential (primary) hypertension: Secondary | ICD-10-CM

## 2018-01-02 DIAGNOSIS — C642 Malignant neoplasm of left kidney, except renal pelvis: Secondary | ICD-10-CM

## 2018-01-02 DIAGNOSIS — R918 Other nonspecific abnormal finding of lung field: Secondary | ICD-10-CM

## 2018-01-02 DIAGNOSIS — R5383 Other fatigue: Secondary | ICD-10-CM

## 2018-01-02 DIAGNOSIS — M129 Arthropathy, unspecified: Secondary | ICD-10-CM

## 2018-01-02 DIAGNOSIS — R197 Diarrhea, unspecified: Secondary | ICD-10-CM

## 2018-01-02 DIAGNOSIS — I2510 Atherosclerotic heart disease of native coronary artery without angina pectoris: Secondary | ICD-10-CM

## 2018-01-02 DIAGNOSIS — Z8673 Personal history of transient ischemic attack (TIA), and cerebral infarction without residual deficits: Secondary | ICD-10-CM

## 2018-01-02 DIAGNOSIS — K716 Toxic liver disease with hepatitis, not elsewhere classified: Secondary | ICD-10-CM

## 2018-01-02 DIAGNOSIS — I255 Ischemic cardiomyopathy: Secondary | ICD-10-CM

## 2018-01-02 DIAGNOSIS — R531 Weakness: Secondary | ICD-10-CM

## 2018-01-02 DIAGNOSIS — Z7984 Long term (current) use of oral hypoglycemic drugs: Secondary | ICD-10-CM

## 2018-01-02 DIAGNOSIS — E119 Type 2 diabetes mellitus without complications: Secondary | ICD-10-CM

## 2018-01-02 DIAGNOSIS — Z905 Acquired absence of kidney: Secondary | ICD-10-CM

## 2018-01-02 DIAGNOSIS — Z7982 Long term (current) use of aspirin: Secondary | ICD-10-CM

## 2018-01-02 DIAGNOSIS — I509 Heart failure, unspecified: Secondary | ICD-10-CM

## 2018-01-02 LAB — CMP (CANCER CENTER ONLY)
ALK PHOS: 549 U/L — AB (ref 38–126)
ALT: 207 U/L — ABNORMAL HIGH (ref 0–44)
ANION GAP: 12 (ref 5–15)
AST: 86 U/L — ABNORMAL HIGH (ref 15–41)
Albumin: 2.8 g/dL — ABNORMAL LOW (ref 3.5–5.0)
BUN: 38 mg/dL — ABNORMAL HIGH (ref 8–23)
CALCIUM: 8.5 mg/dL — AB (ref 8.9–10.3)
CO2: 23 mmol/L (ref 22–32)
Chloride: 103 mmol/L (ref 98–111)
Creatinine: 1.62 mg/dL — ABNORMAL HIGH (ref 0.61–1.24)
GFR, EST NON AFRICAN AMERICAN: 41 mL/min — AB (ref >60)
GFR, Est AFR Am: 48 mL/min — ABNORMAL LOW (ref >60)
Glucose, Bld: 361 mg/dL — ABNORMAL HIGH (ref 70–99)
POTASSIUM: 4.6 mmol/L (ref 3.5–5.1)
Sodium: 138 mmol/L (ref 135–145)
TOTAL PROTEIN: 6.1 g/dL — AB (ref 6.5–8.1)
Total Bilirubin: 2.1 mg/dL — ABNORMAL HIGH (ref 0.3–1.2)

## 2018-01-02 LAB — CBC WITH DIFFERENTIAL (CANCER CENTER ONLY)
Abs Immature Granulocytes: 0.15 10*3/uL — ABNORMAL HIGH (ref 0.00–0.07)
BASOS PCT: 0 %
Basophils Absolute: 0 10*3/uL (ref 0.0–0.1)
EOS ABS: 0 10*3/uL (ref 0.0–0.5)
Eosinophils Relative: 0 %
HCT: 27.3 % — ABNORMAL LOW (ref 39.0–52.0)
Hemoglobin: 8.5 g/dL — ABNORMAL LOW (ref 13.0–17.0)
IMMATURE GRANULOCYTES: 1 %
Lymphocytes Relative: 10 %
Lymphs Abs: 1.6 10*3/uL (ref 0.7–4.0)
MCH: 27 pg (ref 26.0–34.0)
MCHC: 31.1 g/dL (ref 30.0–36.0)
MCV: 86.7 fL (ref 80.0–100.0)
MONO ABS: 1.1 10*3/uL — AB (ref 0.1–1.0)
Monocytes Relative: 7 %
NEUTROS ABS: 13.4 10*3/uL — AB (ref 1.7–7.7)
NEUTROS PCT: 82 %
PLATELETS: 310 10*3/uL (ref 150–400)
RBC: 3.15 MIL/uL — AB (ref 4.22–5.81)
RDW: 17.7 % — AB (ref 11.5–15.5)
WBC: 16.3 10*3/uL — AB (ref 4.0–10.5)
nRBC: 0 % (ref 0.0–0.2)

## 2018-01-02 NOTE — Progress Notes (Signed)
[facility] Telephone:(336) [phone]   Fax:(336) [phone]  OFFICE PROGRESS NOTE  Via, [name], MD [place] [address]  DIAGNOSIS: [name].The patient presented with an11 cm left kidney mass and pulmonary nodules.  PRIOR THERAPY: 1) Status post left nephrectomy on 02/21/2017. 20 Cabometyx 60 mg daily. Started 01/21/2017.Status post 1 month of treatment. Treatment was then placed on hold for nephrectomy. Resuming treatment on 03/22/2017.  Status post 3 weeks of treatment.  The patient was advised to hold Cabometyx starting on 04/11/2017.  This dose will be changed to 40 mg p.o. daily.This was a started May 12, 2017.  Status post 2 months of treatment discontinued secondary to intolerance.   CURRENT THERAPY: Immunotherapy with Nivolumab 418 mg IV every 4 weeks status post 5 cycles.  INTERVAL HISTORY: Trevor [name] 72 y.o. male returns to the clinic today for follow-up visit.  The patient is feeling a little bit better today except for fatigue.  He gained few pounds since his last visit.  His last dose of nivolumab was placed on hold secondary to significant elevation of his liver enzymes suspicious for immunotherapy mediated hepatitis.  The patient was a started on high-dose prednisone initially with 80 mg p.o. daily for 1 week.  He will start the second week of his treatment with 60 mg p.o. daily.  He denied having any current chest pain, shortness breath, cough or hemoptysis.  He denied having any fever or chills.  He has no nausea, vomiting, diarrhea or constipation.  He is here today for evaluation and repeat blood work for close monitoring of his liver function.  MEDICAL HISTORY: Past Medical History:  Diagnosis Date  . 3-vessel coronary artery disease   . Anemia    2 iron infusions  . Arthritis   . Carotid disease, bilateral (HCC)    moderate  . Chronic systolic heart failure (HCC)    EF 45%  . Coronary  artery disease    06/2012:STEMI s/p CABG; 8/18 NSTEMI  . Diabetes mellitus without complication ([facility])   . Diarrhea   . Dyspnea    mild  . History of blood transfusion   . Hyperlipidemia   . Ischemic cardiomyopathy    Ejection fraction of 35-40% initially, 45 - 50% on echo 2014, 53% by perfusion study 2016  . Mild mitral regurgitation   . Numbness    Right side since stroke  . OA (osteoarthritis)   . PAD (peripheral artery disease) ([facility])   . Pulmonary nodule   . Renal cell cancer, left ([facility]) 12/2016   Associated w/ pulmonary nodules  . Stroke ([facility]) 2018  . TIA (transient ischemic attack) 10/2016  . Weakness generalized   . Wears glasses     ALLERGIES:  has No Known Allergies.  MEDICATIONS:  Current Outpatient Medications  Medication Sig Dispense Refill  . aspirin EC 81 MG tablet Take 81 mg by mouth daily.    Marland Kitchen atorvastatin (LIPITOR) 40 MG tablet Take 1 tablet (40 mg total) by mouth daily. 30 tablet 6  . carvedilol (COREG) 6.25 MG tablet Take 1 tablet (6.25 mg total) by mouth 2 (two) times daily. 180 tablet 1  . metFORMIN (GLUCOPHAGE) 1000 MG tablet Take 1 tablet (1,000 mg total) by mouth 2 (two) times daily.  0  . mirtazapine (REMERON) 15 MG tablet TAKE 2 TABLETS(30 MG) BY MOUTH AT BEDTIME 180 tablet 1  . naproxen sodium (ALEVE) 220 MG tablet Take 440  mg by mouth daily as needed (arthritis).    . ondansetron (ZOFRAN ODT) 4 MG disintegrating tablet Take 1 tablet (4 mg total) by mouth every 8 (eight) hours as needed for nausea or vomiting. (Patient not taking: Reported on 11/01/2017) 20 tablet 0  . pantoprazole (PROTONIX) 40 MG tablet Take 1 tablet (40 mg total) by mouth at bedtime. 30 tablet 0  . predniSONE (DELTASONE) 50 MG tablet 4 tablet p.o. daily x1 week, followed by 3 tablets p.o. daily x1 week, followed by 2 tablets p.o. daily x1 week, then 1 tablet p.o. daily x1 week. 75 tablet 0  . prochlorperazine (COMPAZINE) 10 MG tablet Take 1 tablet (10 mg total) by mouth every 6 (six)  hours as needed for nausea or vomiting. (Patient not taking: Reported on 11/01/2017) 30 tablet 0   No current facility-administered medications for this visit.     SURGICAL HISTORY:  Past Surgical History:  Procedure Laterality Date  . CARDIOVASCULAR STRESS TEST    . CORONARY ARTERY BYPASS GRAFT N/A 07/05/2012   Procedure: CORONARY ARTERY BYPASS GRAFTING (CABG);  Surgeon: Ivin Poot, MD;  Location: [facility];  Service: Open Heart Surgery;  Laterality: N/A;  . INTRAOPERATIVE TRANSESOPHAGEAL ECHOCARDIOGRAM N/A 07/05/2012   Procedure: INTRAOPERATIVE TRANSESOPHAGEAL ECHOCARDIOGRAM;  Surgeon: Ivin Poot, MD;  Location: [facility];  Service: Open Heart Surgery;  Laterality: N/A;  . LEFT HEART CATH AND CORS/GRAFTS ANGIOGRAPHY N/A 11/14/2016   Procedure: LEFT HEART CATH AND CORS/GRAFTS ANGIOGRAPHY;  Surgeon: Belva Crome, MD;  Location: [facility];  Service: Cardiovascular;  Laterality: N/A;  . LEFT HEART CATHETERIZATION WITH CORONARY ANGIOGRAM N/A 07/04/2012   Procedure: LEFT HEART CATHETERIZATION WITH CORONARY ANGIOGRAM;  Surgeon: Wellington Hampshire, MD;  Location: [facility];  Service: Cardiovascular;  Laterality: N/A;  . RENAL BIOPSY    . ROBOT ASSISTED LAPAROSCOPIC NEPHRECTOMY Left 02/21/2017   Procedure: XI ROBOTIC ASSISTED LAPAROSCOPIC NEPHRECTOMY;  Surgeon: Alexis Frock, MD;  [facility];  Service: Urology;  Laterality: Left;    REVIEW OF SYSTEMS:  A comprehensive review of systems was negative except for: Constitutional: positive for anorexia, fatigue and weight loss Behavioral/Psych: positive for sleep disturbance   PHYSICAL EXAMINATION: General appearance: alert, cooperative, fatigued and no distress Head: Normocephalic, without obvious abnormality, atraumatic Neck: no adenopathy, no JVD, supple, symmetrical, trachea midline and thyroid not enlarged, symmetric, no tenderness/mass/nodules Lymph nodes: Cervical, supraclavicular, and axillary nodes normal. Resp: clear to  auscultation bilaterally Back: symmetric, no curvature. ROM normal. No CVA tenderness. Cardio: regular rate and rhythm, S1, S2 normal, no murmur, click, rub or gallop GI: soft, non-tender; bowel sounds normal; no masses,  no organomegaly Extremities: extremities normal, atraumatic, no cyanosis or edema  ECOG PERFORMANCE STATUS: 1 - Symptomatic but completely ambulatory  Blood pressure 108/73, pulse 84, temperature 98.1 F (36.7 C), temperature source Oral, resp. rate 18, height 5\' 11"  (1.803 m), weight 174 lb (78.9 kg), SpO2 100 %.  LABORATORY DATA: Lab Results  Component Value Date   WBC 16.3 (H) 01/02/2018   HGB 8.5 (L) 01/02/2018   HCT 27.3 (L) 01/02/2018   MCV 86.7 01/02/2018   PLT 310 01/02/2018      Chemistry      Component Value Date/Time   NA 138 01/02/2018 1116   NA 141 03/22/2017 0919   K 4.6 01/02/2018 1116   K 5.5 (H) 03/22/2017 0919   CL 103 01/02/2018 1116   CO2 23 01/02/2018 1116   CO2 27 03/22/2017 0919   BUN  38 (H) 01/02/2018 1116   BUN 18.1 03/22/2017 0919   CREATININE 1.62 (H) 01/02/2018 1116   CREATININE 1.2 03/22/2017 0919      Component Value Date/Time   CALCIUM 8.5 (L) 01/02/2018 1116   CALCIUM 9.8 03/22/2017 0919   ALKPHOS 549 (H) 01/02/2018 1116   ALKPHOS 103 03/22/2017 0919   AST 86 (H) 01/02/2018 1116   AST 15 03/22/2017 0919   ALT 207 (H) 01/02/2018 1116   ALT 13 03/22/2017 0919   BILITOT 2.1 (H) 01/02/2018 1116   BILITOT 0.41 03/22/2017 0919       RADIOGRAPHIC STUDIES: No results found.  ASSESSMENT AND PLAN: This is a very pleasant 72 years old white male diagnosed with metastatic renal cell carcinoma, chromophobe type presented with large left kidney mass as well as multiple bilateral pulmonary nodules.  He is status post left nephrectomy on February 21, 2017.  The patient is currently on treatment with Cabometyx initially at a dose of 60 mg p.o. daily but he has a rough time tolerating this treatment with several episodes of diarrhea  as well as nausea. His treatment was switched to Cabometyx at a dose of 40 mg p.o. Daily status post 2 months of treatment.  He has a stable disease but the treatment was discontinued secondary to intolerance.   He was started on treatment with immunotherapy with Nivolumab 480 mg IV every 4 weeks status post 5 cycles.   He has been tolerating his treatment well except for recently the patient developed immunotherapy mediated hepatitis with significant elevation of his liver enzymes..  I placed his treatment on hold last week. The patient was a started on high-dose prednisone to be tapered slowly over the next few weeks. Repeat comprehensive metabolic panel today showed improvement but persistent elevation of his liver enzymes. I recommended for the patient to continue with a taper dose of prednisone for now.  I will continue to hold his treatment with immunotherapy until improvement of his liver enzyme to close to the normal range. I will see him back for follow-up visit in 2 weeks for evaluation with repeat CBC and comprehensive metabolic panel. The patient was advised to call immediately if he has any concerning symptoms in the interval. The patient voices understanding of current disease status and treatment options and is in agreement with the current care plan. All questions were answered. The patient knows to call the clinic with any problems, questions or concerns. We can certainly see the patient much sooner if necessary.  Disclaimer: This note was dictated with voice recognition software. Similar sounding words can inadvertently be transcribed and may not be corrected upon review.

## 2018-01-03 ENCOUNTER — Telehealth: Payer: Self-pay | Admitting: Internal Medicine

## 2018-01-03 NOTE — Telephone Encounter (Signed)
Scheduled appt per 10/16 los - pt is aware of appt scheduled per 10/16 los.

## 2018-01-07 ENCOUNTER — Other Ambulatory Visit: Payer: Self-pay

## 2018-01-07 ENCOUNTER — Inpatient Hospital Stay (HOSPITAL_COMMUNITY)
Admission: EM | Admit: 2018-01-07 | Discharge: 2018-01-09 | DRG: 280 | Disposition: A | Discharge status: Home / Self Care | Payer: Medicare Other | Attending: Internal Medicine | Admitting: Internal Medicine

## 2018-01-07 ENCOUNTER — Emergency Department (HOSPITAL_COMMUNITY): Payer: Medicare Other

## 2018-01-07 ENCOUNTER — Encounter (HOSPITAL_COMMUNITY): Payer: Self-pay | Admitting: Emergency Medicine

## 2018-01-07 DIAGNOSIS — K716 Toxic liver disease with hepatitis, not elsewhere classified: Secondary | ICD-10-CM | POA: Diagnosis present

## 2018-01-07 DIAGNOSIS — I10 Essential (primary) hypertension: Secondary | ICD-10-CM | POA: Diagnosis present

## 2018-01-07 DIAGNOSIS — Z9221 Personal history of antineoplastic chemotherapy: Secondary | ICD-10-CM

## 2018-01-07 DIAGNOSIS — E1110 Type 2 diabetes mellitus with ketoacidosis without coma: Secondary | ICD-10-CM | POA: Diagnosis present

## 2018-01-07 DIAGNOSIS — I214 Non-ST elevation (NSTEMI) myocardial infarction: Principal | ICD-10-CM | POA: Diagnosis present

## 2018-01-07 DIAGNOSIS — N183 Chronic kidney disease, stage 3 unspecified: Secondary | ICD-10-CM | POA: Diagnosis present

## 2018-01-07 DIAGNOSIS — Z23 Encounter for immunization: Secondary | ICD-10-CM

## 2018-01-07 DIAGNOSIS — I255 Ischemic cardiomyopathy: Secondary | ICD-10-CM | POA: Diagnosis present

## 2018-01-07 DIAGNOSIS — T380X5A Adverse effect of glucocorticoids and synthetic analogues, initial encounter: Secondary | ICD-10-CM | POA: Diagnosis present

## 2018-01-07 DIAGNOSIS — E785 Hyperlipidemia, unspecified: Secondary | ICD-10-CM | POA: Diagnosis present

## 2018-01-07 DIAGNOSIS — E872 Acidosis, unspecified: Secondary | ICD-10-CM

## 2018-01-07 DIAGNOSIS — I130 Hypertensive heart and chronic kidney disease with heart failure and stage 1 through stage 4 chronic kidney disease, or unspecified chronic kidney disease: Secondary | ICD-10-CM | POA: Diagnosis present

## 2018-01-07 DIAGNOSIS — Z951 Presence of aortocoronary bypass graft: Secondary | ICD-10-CM

## 2018-01-07 DIAGNOSIS — Z66 Do not resuscitate: Secondary | ICD-10-CM | POA: Diagnosis present

## 2018-01-07 DIAGNOSIS — E875 Hyperkalemia: Secondary | ICD-10-CM | POA: Diagnosis present

## 2018-01-07 DIAGNOSIS — I252 Old myocardial infarction: Secondary | ICD-10-CM

## 2018-01-07 DIAGNOSIS — Z79899 Other long term (current) drug therapy: Secondary | ICD-10-CM

## 2018-01-07 DIAGNOSIS — J9601 Acute respiratory failure with hypoxia: Secondary | ICD-10-CM | POA: Diagnosis present

## 2018-01-07 DIAGNOSIS — Z87891 Personal history of nicotine dependence: Secondary | ICD-10-CM

## 2018-01-07 DIAGNOSIS — C7800 Secondary malignant neoplasm of unspecified lung: Secondary | ICD-10-CM | POA: Diagnosis present

## 2018-01-07 DIAGNOSIS — Z823 Family history of stroke: Secondary | ICD-10-CM

## 2018-01-07 DIAGNOSIS — D638 Anemia in other chronic diseases classified elsewhere: Secondary | ICD-10-CM | POA: Diagnosis present

## 2018-01-07 DIAGNOSIS — T50905A Adverse effect of unspecified drugs, medicaments and biological substances, initial encounter: Secondary | ICD-10-CM | POA: Diagnosis present

## 2018-01-07 DIAGNOSIS — K7589 Other specified inflammatory liver diseases: Secondary | ICD-10-CM | POA: Diagnosis present

## 2018-01-07 DIAGNOSIS — I5042 Chronic combined systolic (congestive) and diastolic (congestive) heart failure: Secondary | ICD-10-CM | POA: Diagnosis present

## 2018-01-07 DIAGNOSIS — E1122 Type 2 diabetes mellitus with diabetic chronic kidney disease: Secondary | ICD-10-CM | POA: Diagnosis present

## 2018-01-07 DIAGNOSIS — R739 Hyperglycemia, unspecified: Secondary | ICD-10-CM

## 2018-01-07 DIAGNOSIS — Z7982 Long term (current) use of aspirin: Secondary | ICD-10-CM

## 2018-01-07 DIAGNOSIS — E1151 Type 2 diabetes mellitus with diabetic peripheral angiopathy without gangrene: Secondary | ICD-10-CM | POA: Diagnosis present

## 2018-01-07 DIAGNOSIS — C642 Malignant neoplasm of left kidney, except renal pelvis: Secondary | ICD-10-CM | POA: Diagnosis present

## 2018-01-07 DIAGNOSIS — T451X5A Adverse effect of antineoplastic and immunosuppressive drugs, initial encounter: Secondary | ICD-10-CM | POA: Diagnosis present

## 2018-01-07 DIAGNOSIS — Z8673 Personal history of transient ischemic attack (TIA), and cerebral infarction without residual deficits: Secondary | ICD-10-CM

## 2018-01-07 DIAGNOSIS — D500 Iron deficiency anemia secondary to blood loss (chronic): Secondary | ICD-10-CM | POA: Diagnosis present

## 2018-01-07 DIAGNOSIS — Z905 Acquired absence of kidney: Secondary | ICD-10-CM

## 2018-01-07 DIAGNOSIS — Z8249 Family history of ischemic heart disease and other diseases of the circulatory system: Secondary | ICD-10-CM

## 2018-01-07 DIAGNOSIS — Z85528 Personal history of other malignant neoplasm of kidney: Secondary | ICD-10-CM

## 2018-01-07 DIAGNOSIS — E119 Type 2 diabetes mellitus without complications: Secondary | ICD-10-CM

## 2018-01-07 DIAGNOSIS — I2510 Atherosclerotic heart disease of native coronary artery without angina pectoris: Secondary | ICD-10-CM | POA: Diagnosis present

## 2018-01-07 DIAGNOSIS — Z7984 Long term (current) use of oral hypoglycemic drugs: Secondary | ICD-10-CM

## 2018-01-07 DIAGNOSIS — D72829 Elevated white blood cell count, unspecified: Secondary | ICD-10-CM | POA: Diagnosis present

## 2018-01-07 LAB — CBC
HEMATOCRIT: 28.4 % — AB (ref 39.0–52.0)
HEMOGLOBIN: 8.2 g/dL — AB (ref 13.0–17.0)
MCH: 26 pg (ref 26.0–34.0)
MCHC: 28.9 g/dL — ABNORMAL LOW (ref 30.0–36.0)
MCV: 90.2 fL (ref 80.0–100.0)
Platelets: 320 10*3/uL (ref 150–400)
RBC: 3.15 MIL/uL — ABNORMAL LOW (ref 4.22–5.81)
RDW: 18.9 % — AB (ref 11.5–15.5)
WBC: 10.8 10*3/uL — AB (ref 4.0–10.5)
nRBC: 0 % (ref 0.0–0.2)

## 2018-01-07 LAB — BASIC METABOLIC PANEL
ANION GAP: 16 — AB (ref 5–15)
BUN: 25 mg/dL — ABNORMAL HIGH (ref 8–23)
CHLORIDE: 99 mmol/L (ref 98–111)
CO2: 16 mmol/L — ABNORMAL LOW (ref 22–32)
CREATININE: 1.88 mg/dL — AB (ref 0.61–1.24)
Calcium: 8.5 mg/dL — ABNORMAL LOW (ref 8.9–10.3)
GFR calc Af Amer: 40 mL/min — ABNORMAL LOW (ref >60)
GFR calc non Af Amer: 34 mL/min — ABNORMAL LOW (ref >60)
Glucose, Bld: 479 mg/dL — ABNORMAL HIGH (ref 70–99)
Potassium: 5.6 mmol/L — ABNORMAL HIGH (ref 3.5–5.1)
Sodium: 131 mmol/L — ABNORMAL LOW (ref 135–145)

## 2018-01-07 LAB — CBG MONITORING, ED: Glucose-Capillary: 422 mg/dL — ABNORMAL HIGH (ref 70–99)

## 2018-01-07 LAB — I-STAT CG4 LACTIC ACID, ED: Lactic Acid, Venous: 6.07 mmol/L (ref 0.5–1.9)

## 2018-01-07 LAB — I-STAT TROPONIN, ED: Troponin i, poc: 1.06 ng/mL (ref 0.00–0.08)

## 2018-01-07 MED ORDER — SODIUM CHLORIDE 0.9 % IV BOLUS
1000.0000 mL | Freq: Once | INTRAVENOUS | Status: AC
  Administered 2018-01-07: 1000 mL via INTRAVENOUS

## 2018-01-07 MED ORDER — ONDANSETRON HCL 4 MG/2ML IJ SOLN
4.0000 mg | Freq: Once | INTRAMUSCULAR | Status: AC
  Administered 2018-01-07: 4 mg via INTRAVENOUS
  Filled 2018-01-07: qty 2

## 2018-01-07 MED ORDER — HEPARIN SODIUM (PORCINE) 5000 UNIT/ML IJ SOLN
4000.0000 [IU] | Freq: Once | INTRAMUSCULAR | Status: AC
  Administered 2018-01-07: 4000 [IU] via INTRAVENOUS
  Filled 2018-01-07: qty 1

## 2018-01-07 MED ORDER — HEPARIN (PORCINE) IN NACL 100-0.45 UNIT/ML-% IJ SOLN
1050.0000 [IU]/h | INTRAMUSCULAR | Status: DC
  Administered 2018-01-08: 1050 [IU]/h via INTRAVENOUS
  Filled 2018-01-07 (×2): qty 250

## 2018-01-07 MED ORDER — SODIUM CHLORIDE 0.9% IV SOLUTION
Freq: Once | INTRAVENOUS | Status: AC
  Administered 2018-01-08: 03:00:00 via INTRAVENOUS

## 2018-01-07 MED ORDER — NITROGLYCERIN IN D5W 200-5 MCG/ML-% IV SOLN
0.0000 ug/min | Freq: Once | INTRAVENOUS | Status: AC
  Administered 2018-01-07: 5 ug/min via INTRAVENOUS
  Filled 2018-01-07: qty 250

## 2018-01-07 MED ORDER — SODIUM CHLORIDE 0.9 % IV BOLUS
1000.0000 mL | Freq: Once | INTRAVENOUS | Status: AC
  Administered 2018-01-08: 1000 mL via INTRAVENOUS

## 2018-01-07 NOTE — Progress Notes (Signed)
ANTICOAGULATION CONSULT NOTE - Initial Consult  Pharmacy Consult for heparin Indication: CP  No Known Allergies  Patient Measurements:   Heparin Dosing Weight: 78.9kg  Vital Signs: BP: 109/78 (10/21 2245) Pulse Rate: 92 (10/21 2245)  Labs: Recent Labs    01/07/18 2112  HGB 8.2*  HCT 28.4*  PLT 320  CREATININE 1.88*    Estimated Creatinine Clearance: 38.4 mL/min (A) (by C-G formula based on SCr of 1.88 mg/dL (H)).   Medical History: Past Medical History:  Diagnosis Date  . 3-vessel coronary artery disease   . Anemia    2 iron infusions  . Arthritis   . Carotid disease, bilateral (HCC)    moderate  . Chronic systolic heart failure (HCC)    EF 45%  . Coronary artery disease    06/2012:STEMI s/p CABG; 8/18 NSTEMI  . Diabetes mellitus without complication ([facility])   . Diarrhea   . Dyspnea    mild  . History of blood transfusion   . Hyperlipidemia   . Ischemic cardiomyopathy    Ejection fraction of 35-40% initially, 45 - 50% on echo 2014, 53% by perfusion study 2016  . Mild mitral regurgitation   . Numbness    Right side since stroke  . OA (osteoarthritis)   . PAD (peripheral artery disease) ([facility])   . Pulmonary nodule   . Renal cell cancer, left ([facility]) 12/2016   Associated w/ pulmonary nodules  . Stroke ([facility]) 2018  . TIA (transient ischemic attack) 10/2016  . Weakness generalized   . Wears glasses     Medications:  See medication history  Assessment: 72 yo man to start heparin for CP.  He was not on anticoagulation PTA.Heparin 4000 unit ordered in ED Goal of Therapy:  Heparin level 0.3-0.7 units/ml Monitor platelets by anticoagulation protocol: Yes   Plan:  Heparin drip at 1050 units/hr Check heparin level in 6-8 hours Daily HL and CBC Monitor for bleeding complications  Merin Borjon [place] 01/07/2018,11:45 PM

## 2018-01-07 NOTE — ED Provider Notes (Signed)
[facility] EMERGENCY DEPARTMENT Provider Note   CSN: [id number] Arrival date & time: 01/07/18  2055     History   Chief Complaint Chief Complaint  Patient presents with  . Chest Pain  . Hyperglycemia    HPI Trevor [name] is a 72 y.o. male.  HPI Patient is a 72 year old male with a past medical history of CAD post CABG, diabetes, carotid disease, anemia, CHF, mitral regurgitation, previous stroke, and renal cell carcinoma with pulmonary metastasis presents to the emergency department for evaluation of hyperglycemia and chest pain.  Patient reports that approximately 2 hours ago he began to develop significant heaviness in his bilateral arms.  This was immediately preceded by an episode of diaphoresis.  He then began to have a dull, heavy chest pain begin in the center of his chest.  This pain has been coming and going since the onset.  It did not radiate outside of his chest and arms.  He has not been short of breath, but does report a mild cough earlier today.  Denies any fevers or chills.  Denies any trauma to the chest.  Denies any leg swelling or calf tenderness.  There is no pleuritic component to his chest pain.  He was given 325 of aspirin prior to arrival with no significant change in his symptoms.  Remaining review of systems is negative at this time.  Past Medical History:  Diagnosis Date  . 3-vessel coronary artery disease   . Anemia    2 iron infusions  . Arthritis   . Carotid disease, bilateral (HCC)    moderate  . Chronic systolic heart failure (HCC)    EF 45%  . Coronary artery disease    06/2012:STEMI s/p CABG; 8/18 NSTEMI  . Diabetes mellitus without complication ([place])   . Diarrhea   . Dyspnea    mild  . History of blood transfusion   . Hyperlipidemia   . Ischemic cardiomyopathy    Ejection fraction of 35-40% initially, 45 - 50% on echo 2014, 53% by perfusion study 2016  . Mild mitral regurgitation   . Numbness    Right side since stroke   . OA (osteoarthritis)   . PAD (peripheral artery disease) ([place])   . Pulmonary nodule   . Renal cell cancer, left ([place]) 12/2016   Associated w/ pulmonary nodules  . Stroke ([place]) 2018  . TIA (transient ischemic attack) 10/2016  . Weakness generalized   . Wears glasses     Patient Active Problem List   Diagnosis Date Noted  . CKD (chronic kidney disease), stage III ([place]) 01/08/2018  . Hyperkalemia 01/08/2018  . DKA (diabetic ketoacidoses) ([place]) 01/08/2018  . Lactic acidosis   . Drug-induced hepatitis 01/02/2018  . Hypertension 11/01/2017  . Encounter for antineoplastic immunotherapy 09/06/2017  . Goals of care, counseling/discussion 07/04/2017  . Malnutrition of moderate degree 04/19/2017  . Colitis 04/19/2017  . Acute kidney injury ([place]) 04/17/2017  . History of CVA (cerebrovascular accident) 04/17/2017  . Severe diarrhea 04/17/2017  . 3-vessel coronary artery disease 04/17/2017  . Chronic combined systolic and diastolic heart failure ([place]) 04/17/2017  . Diabetes mellitus type 2 in nonobese ([place]) 04/17/2017  . Chronic systolic heart failure ([place])   . Dehydration 04/11/2017  . Nausea without vomiting 04/11/2017  . Elevated LFTs 04/11/2017  . Encounter for antineoplastic chemotherapy 02/05/2017  . Renal cell cancer ([place]) 01/18/2017  . Renal cell carcinoma ([place]) 01/15/2017  . Renal cell cancer, left ([place]) 12/18/2016  . Renal mass  12/06/2016  . 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 NSTEMI (non-ST elevated myocardial infarction) ([place])   . Anemia 11/11/2016  . TIA (transient ischemic attack) 10/18/2016  . Iron deficiency anemia due to chronic blood loss 09/20/2015  . Recent weight loss 09/20/2015  . Bilateral leg pain 02/21/2015  . Coronary artery disease   . Ischemic cardiomyopathy   . Hyperlipidemia    . ST elevation myocardial infarction (STEMI) of anterior wall, initial episode of care ([place]) 07/04/2012  . Hyperglycemia 07/04/2012    Past Surgical History:  Procedure Laterality Date  . CARDIOVASCULAR STRESS TEST    . CORONARY ARTERY BYPASS GRAFT N/A 07/05/2012   Procedure: CORONARY ARTERY BYPASS GRAFTING (CABG);  Surgeon: Ivin Poot, MD;  Location: [facility];  Service: Open Heart Surgery;  Laterality: N/A;  . INTRAOPERATIVE TRANSESOPHAGEAL ECHOCARDIOGRAM N/A 07/05/2012   Procedure: INTRAOPERATIVE TRANSESOPHAGEAL ECHOCARDIOGRAM;  Surgeon: Ivin Poot, MD;  Location: [facility];  Service: Open Heart Surgery;  Laterality: N/A;  . LEFT HEART CATH AND CORS/GRAFTS ANGIOGRAPHY N/A 11/14/2016   Procedure: LEFT HEART CATH AND CORS/GRAFTS ANGIOGRAPHY;  Surgeon: Belva Crome, MD;  Location: [facility];  Service: Cardiovascular;  Laterality: N/A;  . LEFT HEART CATHETERIZATION WITH CORONARY ANGIOGRAM N/A 07/04/2012   Procedure: LEFT HEART CATHETERIZATION WITH CORONARY ANGIOGRAM;  Surgeon: Wellington Hampshire, MD;  Location: [facility];  Service: Cardiovascular;  Laterality: N/A;  . RENAL BIOPSY    . ROBOT ASSISTED LAPAROSCOPIC NEPHRECTOMY Left 02/21/2017   Procedure: XI ROBOTIC ASSISTED LAPAROSCOPIC NEPHRECTOMY;  Surgeon: Alexis Frock, MD;  Location: [facility];  Service: Urology;  Laterality: Left;        Home Medications    Prior to Admission medications   Medication Sig Start Date End Date Taking? Authorizing Provider  aspirin EC 81 MG tablet Take 81 mg by mouth daily.   Yes [provider]  atorvastatin (LIPITOR) 40 MG tablet Take 1 tablet (40 mg total) by mouth daily. 05/20/13  Yes Wellington Hampshire, MD  carvedilol (COREG) 6.25 MG tablet Take 1 tablet (6.25 mg total) by mouth 2 (two) times daily. 08/28/17  Yes Wellington Hampshire, MD  ferrous sulfate 325 (65 FE) MG tablet Take 325 mg by mouth daily with breakfast.   Yes [provider]  metFORMIN (GLUCOPHAGE) 1000 MG tablet  Take 1 tablet (1,000 mg total) by mouth 2 (two) times daily. 04/21/17  Yes Rai, Ripudeep K, MD  naproxen sodium (ALEVE) 220 MG tablet Take 440 mg by mouth daily as needed (arthritis).   Yes [provider]  predniSONE (DELTASONE) 50 MG tablet 4 tablet p.o. daily x1 week, followed by 3 tablets p.o. daily x1 week, followed by 2 tablets p.o. daily x1 week, then 1 tablet p.o. daily x1 week. Patient taking differently: Take 50-200 mg by mouth See admin instructions. 4 tablet p.o. daily x1 week, followed by 3 tablets p.o. daily x1 week, followed by 2 tablets p.o. daily x1 week, then 1 tablet p.o. daily x1 week. 12/27/17  Yes Curt Bears, MD  mirtazapine (REMERON) 15 MG tablet TAKE 2 TABLETS(30 MG) BY MOUTH AT BEDTIME Patient not taking: Reported on 01/07/2018 12/27/17   Curt Bears, MD  ondansetron (ZOFRAN ODT) 4 MG disintegrating tablet Take 1 tablet (4 mg total) by mouth every 8 (eight) hours as needed for nausea or vomiting. Patient  not taking: Reported on 11/01/2017 04/20/17   Rai, Vernelle Emerald, MD  pantoprazole (PROTONIX) 40 MG tablet Take 1 tablet (40 mg total) by mouth at bedtime. Patient not taking: Reported on 01/07/2018 11/22/16   Charm Rings, NP  prochlorperazine (COMPAZINE) 10 MG tablet Take 1 tablet (10 mg total) by mouth every 6 (six) hours as needed for nausea or vomiting. Patient not taking: Reported on 11/01/2017 04/20/17   Mendel Corning, MD    Family History Family History  Problem Relation Age of Onset  . Cancer Mother        leukemia  . Stroke Mother   . Cancer Maternal Grandfather        possible cancer  . Heart attack Brother 73    Social History Social History   Tobacco Use  . Smoking status: Former Smoker    Packs/day: 1.00    Years: 50.00    Pack years: 50.00    Last attempt to quit: 04/20/2012    Years since quitting: 5.7  . Smokeless tobacco: Never Used  Substance Use Topics  . Alcohol use: No  . Drug use: No     Allergies   Patient has no  known allergies.   Review of Systems Review of Systems  Constitutional: Positive for diaphoresis. Negative for chills and fever.  HENT: Negative for ear pain and sore throat.   Eyes: Negative for pain and visual disturbance.  Respiratory: Positive for cough. Negative for shortness of breath.   Cardiovascular: Positive for chest pain. Negative for palpitations.  Gastrointestinal: Negative for abdominal pain and vomiting.  Genitourinary: Negative for dysuria and hematuria.  Musculoskeletal: Positive for myalgias (bilateral arm pain). Negative for arthralgias and back pain.  Skin: Negative for color change and rash.  Neurological: Negative for seizures and syncope.  Psychiatric/Behavioral: Negative for agitation, behavioral problems and confusion.  All other systems reviewed and are negative.    Physical Exam Updated Vital Signs BP 99/68   Pulse (!) 105   Temp 97.8 F (36.6 C) (Oral)   Resp 16   SpO2 95%   Physical Exam  Constitutional: He is oriented to person, place, and time. He appears well-developed and well-nourished. He appears distressed.  Patient very pale.  HENT:  Head: Normocephalic and atraumatic.  Eyes: Conjunctivae are normal.  Neck: Normal range of motion. Neck supple.  Cardiovascular: Regular rhythm. Tachycardia present.  Pulmonary/Chest: Effort normal and breath sounds normal. No respiratory distress.  Abdominal: Soft. There is no tenderness.  Musculoskeletal: He exhibits no edema, tenderness or deformity.  Neurological: He is alert and oriented to person, place, and time.  Skin: Skin is warm and dry. There is pallor.  Psychiatric: He has a normal mood and affect.  Nursing note and vitals reviewed.    ED Treatments / Results  Labs (all labs ordered are listed, but only abnormal results are displayed) Labs Reviewed  BASIC METABOLIC PANEL - Abnormal; Notable for the following components:      Result Value   Sodium 131 (*)    Potassium 5.6 (*)    CO2  16 (*)    Glucose, Bld 479 (*)    BUN 25 (*)    Creatinine, Ser 1.88 (*)    Calcium 8.5 (*)    GFR calc non Af Amer 34 (*)    GFR calc Af Amer 40 (*)    Anion gap 16 (*)    All other components within normal limits  CBC - Abnormal; Notable for the following  components:   WBC 10.8 (*)    RBC 3.15 (*)    Hemoglobin 8.2 (*)    HCT 28.4 (*)    MCHC 28.9 (*)    RDW 18.9 (*)    All other components within normal limits  BASIC METABOLIC PANEL - Abnormal; Notable for the following components:   Potassium 5.8 (*)    CO2 17 (*)    Glucose, Bld 318 (*)    BUN 25 (*)    Creatinine, Ser 1.73 (*)    Calcium 8.4 (*)    GFR calc non Af Amer 38 (*)    GFR calc Af Amer 44 (*)    All other components within normal limits  LACTIC ACID, PLASMA - Abnormal; Notable for the following components:   Lactic Acid, Venous 3.3 (*)    All other components within normal limits  LACTIC ACID, PLASMA - Abnormal; Notable for the following components:   Lactic Acid, Venous 3.0 (*)    All other components within normal limits  TROPONIN I - Abnormal; Notable for the following components:   Troponin I 41.84 (*)    All other components within normal limits  HEPATIC FUNCTION PANEL - Abnormal; Notable for the following components:   Total Protein 5.6 (*)    Albumin 2.7 (*)    AST 120 (*)    ALT 150 (*)    Alkaline Phosphatase 346 (*)    Total Bilirubin 1.7 (*)    Bilirubin, Direct 0.7 (*)    Indirect Bilirubin 1.0 (*)    All other components within normal limits  TROPONIN I - Abnormal; Notable for the following components:   Troponin I 45.83 (*)    All other components within normal limits  BASIC METABOLIC PANEL - Abnormal; Notable for the following components:   CO2 15 (*)    Glucose, Bld 188 (*)    BUN 26 (*)    Creatinine, Ser 1.68 (*)    Calcium 8.1 (*)    GFR calc non Af Amer 39 (*)    GFR calc Af Amer 46 (*)    All other components within normal limits  GLUCOSE, CAPILLARY - Abnormal; Notable for  the following components:   Glucose-Capillary 122 (*)    All other components within normal limits  GLUCOSE, CAPILLARY - Abnormal; Notable for the following components:   Glucose-Capillary 117 (*)    All other components within normal limits  I-STAT TROPONIN, ED - Abnormal; Notable for the following components:   Troponin i, poc 1.06 (*)    All other components within normal limits  CBG MONITORING, ED - Abnormal; Notable for the following components:   Glucose-Capillary 422 (*)    All other components within normal limits  I-STAT CG4 LACTIC ACID, ED - Abnormal; Notable for the following components:   Lactic Acid, Venous 6.07 (*)    All other components within normal limits  I-STAT VENOUS BLOOD GAS, ED - Abnormal; Notable for the following components:   pCO2, Ven 27.7 (*)    Bicarbonate 15.9 (*)    TCO2 17 (*)    Acid-base deficit 8.0 (*)    All other components within normal limits  CBG MONITORING, ED - Abnormal; Notable for the following components:   Glucose-Capillary 301 (*)    All other components within normal limits  CBG MONITORING, ED - Abnormal; Notable for the following components:   Glucose-Capillary 277 (*)    All other components within normal limits  CBG MONITORING, ED - Abnormal;  Notable for the following components:   Glucose-Capillary 259 (*)    All other components within normal limits  CBG MONITORING, ED - Abnormal; Notable for the following components:   Glucose-Capillary 204 (*)    All other components within normal limits  CBG MONITORING, ED - Abnormal; Notable for the following components:   Glucose-Capillary 176 (*)    All other components within normal limits  CBG MONITORING, ED - Abnormal; Notable for the following components:   Glucose-Capillary 129 (*)    All other components within normal limits  HEPARIN LEVEL (UNFRACTIONATED)  URINALYSIS, ROUTINE W REFLEX MICROSCOPIC  BASIC METABOLIC PANEL  BASIC METABOLIC PANEL  TROPONIN I  TROPONIN I  TYPE  AND SCREEN  PREPARE RBC (CROSSMATCH)    EKG EKG Interpretation  Date/Time:  Monday January 07 2018 21:02:09 EDT Ventricular Rate:  90 PR Interval:  168 QRS Duration: 84 QT Interval:  380 QTC Calculation: 464 R Axis:   -5 Text Interpretation:  Normal sinus rhythm Cannot rule out Anterior infarct , age undetermined new  Marked ST abnormality, possible inferolateral subendocardial injury Confirmed by Blanchie Dessert [phone]) on 01/07/2018 9:21:56 PM   Radiology Dg Chest Portable 1 View  Result Date: 01/07/2018 CLINICAL DATA:  Chest pain hyperglycemia EXAM: PORTABLE CHEST 1 VIEW COMPARISON:  CT 10/31/2016, radiograph 10/03/2017 FINDINGS: Post sternotomy changes. Cardiomegaly. No focal opacity or pleural effusion. No pneumothorax. IMPRESSION: No active disease.  Stable mild cardiomegaly. Electronically Signed   By: Donavan Foil M.D.   On: 01/07/2018 21:38    Procedures Procedures (including critical care time)  Medications Ordered in ED Medications  heparin ADULT infusion 100 units/mL (25000 units/26mL sodium chloride 0.45%) (0 Units/hr Intravenous Stopped 01/08/18 1341)  0.9 %  sodium chloride infusion ( Intravenous Rate/Dose Verify 01/08/18 1150)  dextrose 5 %-0.45 % sodium chloride infusion ( Intravenous Rate/Dose Change 01/08/18 1348)  insulin regular, human (MYXREDLIN) 100 units/ 100 mL infusion (0.6 Units/hr Intravenous Rate/Dose Change 01/08/18 1324)  aspirin EC tablet 81 mg ( Oral Automatically Held 01/16/18 1000)  acetaminophen (TYLENOL) tablet 650 mg ( Oral MAR Hold 01/08/18 1307)  ondansetron (ZOFRAN) injection 4 mg ( Intravenous MAR Hold 01/08/18 1307)  nitroGLYCERIN 50 mg in dextrose 5 % 250 mL (0.2 mg/mL) infusion (0 mcg/min Intravenous Stopped 01/08/18 1330)  sodium chloride flush (NS) 0.9 % injection 3 mL (3 mLs Intravenous Not Given 01/08/18 1058)  sodium chloride flush (NS) 0.9 % injection 3 mL (has no administration in time range)  0.9 %  sodium chloride  infusion (has no administration in time range)  perflutren lipid microspheres (DEFINITY) IV suspension (2 mLs Intravenous Given 01/08/18 0911)  0.9 %  sodium chloride infusion ( Intravenous New Bag/Given 01/08/18 1058)  aspirin chewable tablet 81 mg (has no administration in time range)  metoprolol tartrate (LOPRESSOR) 25 mg/10 mL oral suspension 6.25 mg ( Oral Automatically Held 01/16/18 1800)  predniSONE (DELTASONE) tablet 200 mg ( Oral MAR Hold 01/08/18 1307)    Followed by  predniSONE (DELTASONE) tablet 150 mg ( Oral Automatically Held 01/15/18 0800)    Followed by  predniSONE (DELTASONE) tablet 100 mg ( Oral Automatically Held 01/22/18 0800)    Followed by  predniSONE (DELTASONE) tablet 50 mg ( Oral Automatically Held 01/29/18 0800)  sodium chloride 0.9 % bolus 1,000 mL (0 mLs Intravenous Stopped 01/07/18 2342)  0.9 %  sodium chloride infusion (Manually program via Guardrails IV Fluids) ( Intravenous New Bag/Given 01/08/18 0309)  nitroGLYCERIN 50 mg in dextrose 5 % 250 mL (  0.2 mg/mL) infusion (30 mcg/min Intravenous Rate/Dose Change 01/08/18 0312)  heparin injection 4,000 Units (4,000 Units Intravenous Given 01/07/18 2342)  ondansetron (ZOFRAN) injection 4 mg (4 mg Intravenous Given 01/07/18 2343)  sodium chloride 0.9 % bolus 1,000 mL (0 mLs Intravenous Stopped 01/08/18 0055)  0.9 %  sodium chloride infusion (10 mL/hr Intravenous New Bag/Given 01/08/18 1325)     Initial Impression / Assessment and Plan / ED Course  I have reviewed the triage vital signs and the nursing notes.  Pertinent labs & imaging results that were available during my care of the patient were reviewed by me and considered in my medical decision making (see chart for details).     Patient is a 72 year old male with past medical history as detailed above who presents emergency department for evaluation of chest pain, arm pain, shortness of breath.  Upon arrival patient appeared in significant distress.  He was  tachycardic but mentating well.  Initial EKG did show new ST elevation in noncontiguous leads and ST depression concerning for ischemia but did not meet STEMI criteria.  Compared to patient's arrival complaint labs and imaging studies were obtained.  Labs are resulted above and are significant for elevated troponin, elevated lactic acid, hyperglycemia, elevated anion gap, and anemia with a hemoglobin of 8.2.  Secondary to these findings, patient was given 1 unit of blood, started on nitroglycerin drip, and started on a heparin drip after Hemoccult found to be negative.  Audiology was consulted to evaluate the patient and do not believe he needs emergent catheterization at this time.  They do recommend admission to the ICU given patient's concerning presentation and significant past medical history.  ICU team was consulted for admission.  Patient except their service for further evaluation and care.  Patient continued to have intermittent chest pain and nausea while under my care, however his vital signs overall remained stable.  His blood pressure which was initially soft responded very well to multiple boluses of fluid.  At this time I am concerned for an ischemic cause of patient's presenting symptoms.  His EKG and elevated troponin are very concerning.  I am also concerned that his anemia may be contributing to a possible ischemic event.  Patient's lactic acidosis also is concerning for hypoperfusion possibly related to cardiac dysfunction/hypotension.  The care of this patient was discussed with my attending physician Dr. Maryan Rued, who voices agreement with work-up and ED disposition.  Final Clinical Impressions(s) / ED Diagnoses   Final diagnoses:  NSTEMI (non-ST elevated myocardial infarction) ([place])  Hyperglycemia  Lactic acidosis    ED Discharge Orders    None       Corrigan Kretschmer, Chanda Busing, MD 01/08/18 1438    Blanchie Dessert, MD 01/08/18 1515

## 2018-01-07 NOTE — ED Notes (Signed)
Troponin results given to [place] and Dr. Maryan Rued

## 2018-01-07 NOTE — ED Notes (Signed)
Removed nonrebreather and placed on 4L [place], remains alert and oriented. Cardiology fellow at bedside

## 2018-01-07 NOTE — ED Notes (Signed)
Pain increasing and radiating to legs, another EKG performed. SPo2 down to 86, placed on 2L [place]. Unable to increase SpO2 despite titrating up to 6L [place]. SpO2 down to 70s with a good waveform on monitor, pt's mental status decling, more difficult to rouse. Placed on nonrebreather, up to 99% and patient more alert.

## 2018-01-07 NOTE — ED Triage Notes (Signed)
Patient from home, having hyperglycemia and chest pain.  He started on prednisone last week, with chest tightness that has been going on for 2 hours today.  Significant cardiac history and Kidney CA, stage 4.

## 2018-01-08 ENCOUNTER — Encounter (HOSPITAL_COMMUNITY)
Admission: EM | Disposition: A | Discharge status: Home / Self Care | Payer: Self-pay | Source: Home / Self Care | Attending: Internal Medicine

## 2018-01-08 ENCOUNTER — Inpatient Hospital Stay (HOSPITAL_COMMUNITY): Payer: Medicare Other

## 2018-01-08 ENCOUNTER — Other Ambulatory Visit: Payer: Self-pay

## 2018-01-08 ENCOUNTER — Encounter (HOSPITAL_COMMUNITY): Payer: Self-pay | Admitting: Family Medicine

## 2018-01-08 ENCOUNTER — Other Ambulatory Visit (HOSPITAL_COMMUNITY): Payer: Medicare Other

## 2018-01-08 DIAGNOSIS — E872 Acidosis, unspecified: Secondary | ICD-10-CM | POA: Insufficient documentation

## 2018-01-08 DIAGNOSIS — Z85528 Personal history of other malignant neoplasm of kidney: Secondary | ICD-10-CM | POA: Diagnosis not present

## 2018-01-08 DIAGNOSIS — E119 Type 2 diabetes mellitus without complications: Secondary | ICD-10-CM | POA: Diagnosis not present

## 2018-01-08 DIAGNOSIS — T50905A Adverse effect of unspecified drugs, medicaments and biological substances, initial encounter: Secondary | ICD-10-CM

## 2018-01-08 DIAGNOSIS — I214 Non-ST elevation (NSTEMI) myocardial infarction: Secondary | ICD-10-CM

## 2018-01-08 DIAGNOSIS — I2510 Atherosclerotic heart disease of native coronary artery without angina pectoris: Secondary | ICD-10-CM

## 2018-01-08 DIAGNOSIS — E1110 Type 2 diabetes mellitus with ketoacidosis without coma: Secondary | ICD-10-CM | POA: Diagnosis present

## 2018-01-08 DIAGNOSIS — N183 Chronic kidney disease, stage 3 unspecified: Secondary | ICD-10-CM | POA: Diagnosis present

## 2018-01-08 DIAGNOSIS — I252 Old myocardial infarction: Secondary | ICD-10-CM | POA: Diagnosis not present

## 2018-01-08 DIAGNOSIS — E785 Hyperlipidemia, unspecified: Secondary | ICD-10-CM | POA: Diagnosis present

## 2018-01-08 DIAGNOSIS — E1151 Type 2 diabetes mellitus with diabetic peripheral angiopathy without gangrene: Secondary | ICD-10-CM | POA: Diagnosis present

## 2018-01-08 DIAGNOSIS — J9601 Acute respiratory failure with hypoxia: Secondary | ICD-10-CM | POA: Diagnosis present

## 2018-01-08 DIAGNOSIS — Z7984 Long term (current) use of oral hypoglycemic drugs: Secondary | ICD-10-CM | POA: Diagnosis not present

## 2018-01-08 DIAGNOSIS — E1122 Type 2 diabetes mellitus with diabetic chronic kidney disease: Secondary | ICD-10-CM | POA: Diagnosis present

## 2018-01-08 DIAGNOSIS — I5042 Chronic combined systolic (congestive) and diastolic (congestive) heart failure: Secondary | ICD-10-CM

## 2018-01-08 DIAGNOSIS — I130 Hypertensive heart and chronic kidney disease with heart failure and stage 1 through stage 4 chronic kidney disease, or unspecified chronic kidney disease: Secondary | ICD-10-CM | POA: Diagnosis present

## 2018-01-08 DIAGNOSIS — Z951 Presence of aortocoronary bypass graft: Secondary | ICD-10-CM | POA: Diagnosis not present

## 2018-01-08 DIAGNOSIS — I5030 Unspecified diastolic (congestive) heart failure: Secondary | ICD-10-CM

## 2018-01-08 DIAGNOSIS — Z8249 Family history of ischemic heart disease and other diseases of the circulatory system: Secondary | ICD-10-CM | POA: Diagnosis not present

## 2018-01-08 DIAGNOSIS — I255 Ischemic cardiomyopathy: Secondary | ICD-10-CM | POA: Diagnosis present

## 2018-01-08 DIAGNOSIS — E875 Hyperkalemia: Secondary | ICD-10-CM | POA: Diagnosis present

## 2018-01-08 DIAGNOSIS — C642 Malignant neoplasm of left kidney, except renal pelvis: Secondary | ICD-10-CM

## 2018-01-08 DIAGNOSIS — Z905 Acquired absence of kidney: Secondary | ICD-10-CM | POA: Diagnosis not present

## 2018-01-08 DIAGNOSIS — Z87891 Personal history of nicotine dependence: Secondary | ICD-10-CM | POA: Diagnosis not present

## 2018-01-08 DIAGNOSIS — I10 Essential (primary) hypertension: Secondary | ICD-10-CM

## 2018-01-08 DIAGNOSIS — Z8673 Personal history of transient ischemic attack (TIA), and cerebral infarction without residual deficits: Secondary | ICD-10-CM | POA: Diagnosis not present

## 2018-01-08 DIAGNOSIS — K716 Toxic liver disease with hepatitis, not elsewhere classified: Secondary | ICD-10-CM

## 2018-01-08 DIAGNOSIS — Z823 Family history of stroke: Secondary | ICD-10-CM | POA: Diagnosis not present

## 2018-01-08 DIAGNOSIS — D500 Iron deficiency anemia secondary to blood loss (chronic): Secondary | ICD-10-CM

## 2018-01-08 DIAGNOSIS — Z7982 Long term (current) use of aspirin: Secondary | ICD-10-CM | POA: Diagnosis not present

## 2018-01-08 DIAGNOSIS — I200 Unstable angina: Secondary | ICD-10-CM

## 2018-01-08 DIAGNOSIS — C7800 Secondary malignant neoplasm of unspecified lung: Secondary | ICD-10-CM | POA: Diagnosis present

## 2018-01-08 DIAGNOSIS — Z23 Encounter for immunization: Secondary | ICD-10-CM | POA: Diagnosis present

## 2018-01-08 DIAGNOSIS — Z79899 Other long term (current) drug therapy: Secondary | ICD-10-CM | POA: Diagnosis not present

## 2018-01-08 HISTORY — PX: LEFT HEART CATH AND CORS/GRAFTS ANGIOGRAPHY: CATH118250

## 2018-01-08 LAB — I-STAT VENOUS BLOOD GAS, ED
ACID-BASE DEFICIT: 8 mmol/L — AB (ref 0.0–2.0)
Bicarbonate: 15.9 mmol/L — ABNORMAL LOW (ref 20.0–28.0)
O2 SAT: 68 %
PO2 VEN: 36 mmHg (ref 32.0–45.0)
TCO2: 17 mmol/L — AB (ref 22–32)
pCO2, Ven: 27.7 mmHg — ABNORMAL LOW (ref 44.0–60.0)
pH, Ven: 7.367 (ref 7.250–7.430)

## 2018-01-08 LAB — HEPARIN LEVEL (UNFRACTIONATED): Heparin Unfractionated: 0.44 IU/mL (ref 0.30–0.70)

## 2018-01-08 LAB — BASIC METABOLIC PANEL
ANION GAP: 10 (ref 5–15)
Anion gap: 12 (ref 5–15)
Anion gap: 11 (ref 5–15)
BUN: 25 mg/dL — AB (ref 8–23)
BUN: 26 mg/dL — ABNORMAL HIGH (ref 8–23)
BUN: 26 mg/dL — AB (ref 8–23)
CHLORIDE: 106 mmol/L (ref 98–111)
CHLORIDE: 110 mmol/L (ref 98–111)
CHLORIDE: 112 mmol/L — AB (ref 98–111)
CO2: 17 mmol/L — AB (ref 22–32)
CO2: 15 mmol/L — AB (ref 22–32)
CO2: 15 mmol/L — AB (ref 22–32)
CREATININE: 1.73 mg/dL — AB (ref 0.61–1.24)
CREATININE: 1.68 mg/dL — AB (ref 0.61–1.24)
Calcium: 8.4 mg/dL — ABNORMAL LOW (ref 8.9–10.3)
Calcium: 8.1 mg/dL — ABNORMAL LOW (ref 8.9–10.3)
Calcium: 8.2 mg/dL — ABNORMAL LOW (ref 8.9–10.3)
Creatinine, Ser: 1.69 mg/dL — ABNORMAL HIGH (ref 0.61–1.24)
GFR calc Af Amer: 44 mL/min — ABNORMAL LOW (ref >60)
GFR calc Af Amer: 45 mL/min — ABNORMAL LOW (ref >60)
GFR calc non Af Amer: 38 mL/min — ABNORMAL LOW (ref >60)
GFR calc non Af Amer: 39 mL/min — ABNORMAL LOW (ref >60)
GFR calc non Af Amer: 39 mL/min — ABNORMAL LOW (ref >60)
GFR, EST AFRICAN AMERICAN: 46 mL/min — AB (ref >60)
GLUCOSE: 318 mg/dL — AB (ref 70–99)
Glucose, Bld: 188 mg/dL — ABNORMAL HIGH (ref 70–99)
Glucose, Bld: 223 mg/dL — ABNORMAL HIGH (ref 70–99)
POTASSIUM: 5.8 mmol/L — AB (ref 3.5–5.1)
Potassium: 5.1 mmol/L (ref 3.5–5.1)
Potassium: 5.1 mmol/L (ref 3.5–5.1)
SODIUM: 135 mmol/L (ref 135–145)
SODIUM: 135 mmol/L (ref 135–145)
SODIUM: 138 mmol/L (ref 135–145)

## 2018-01-08 LAB — GLUCOSE, CAPILLARY
GLUCOSE-CAPILLARY: 123 mg/dL — AB (ref 70–99)
Glucose-Capillary: 122 mg/dL — ABNORMAL HIGH (ref 70–99)
Glucose-Capillary: 117 mg/dL — ABNORMAL HIGH (ref 70–99)
Glucose-Capillary: 139 mg/dL — ABNORMAL HIGH (ref 70–99)
Glucose-Capillary: 188 mg/dL — ABNORMAL HIGH (ref 70–99)
Glucose-Capillary: 226 mg/dL — ABNORMAL HIGH (ref 70–99)

## 2018-01-08 LAB — TROPONIN I
Troponin I: 41.84 ng/mL (ref <0.03)
Troponin I: 45.83 ng/mL (ref <0.03)
Troponin I: 20.32 ng/mL (ref <0.03)

## 2018-01-08 LAB — LACTIC ACID, PLASMA
LACTIC ACID, VENOUS: 3.3 mmol/L — AB (ref 0.5–1.9)
Lactic Acid, Venous: 3 mmol/L (ref 0.5–1.9)

## 2018-01-08 LAB — HEPATIC FUNCTION PANEL
ALBUMIN: 2.7 g/dL — AB (ref 3.5–5.0)
ALK PHOS: 346 U/L — AB (ref 38–126)
ALT: 150 U/L — AB (ref 0–44)
AST: 120 U/L — AB (ref 15–41)
BILIRUBIN DIRECT: 0.7 mg/dL — AB (ref 0.0–0.2)
BILIRUBIN TOTAL: 1.7 mg/dL — AB (ref 0.3–1.2)
Indirect Bilirubin: 1 mg/dL — ABNORMAL HIGH (ref 0.3–0.9)
Total Protein: 5.6 g/dL — ABNORMAL LOW (ref 6.5–8.1)

## 2018-01-08 LAB — CBG MONITORING, ED
GLUCOSE-CAPILLARY: 176 mg/dL — AB (ref 70–99)
GLUCOSE-CAPILLARY: 129 mg/dL — AB (ref 70–99)
Glucose-Capillary: 301 mg/dL — ABNORMAL HIGH (ref 70–99)
Glucose-Capillary: 277 mg/dL — ABNORMAL HIGH (ref 70–99)
Glucose-Capillary: 259 mg/dL — ABNORMAL HIGH (ref 70–99)
Glucose-Capillary: 204 mg/dL — ABNORMAL HIGH (ref 70–99)

## 2018-01-08 LAB — PREPARE RBC (CROSSMATCH)

## 2018-01-08 LAB — POCT ACTIVATED CLOTTING TIME: Activated Clotting Time: 103 seconds

## 2018-01-08 LAB — ECHOCARDIOGRAM COMPLETE

## 2018-01-08 SURGERY — LEFT HEART CATH AND CORS/GRAFTS ANGIOGRAPHY
Anesthesia: LOCAL

## 2018-01-08 MED ORDER — ACETAMINOPHEN 325 MG PO TABS: 650.0000 mg | ORAL_TABLET | ORAL | Status: DC | PRN

## 2018-01-08 MED ORDER — DEXTROSE-NACL 5-0.45 % IV SOLN
INTRAVENOUS | Status: DC
  Administered 2018-01-08: 08:00:00 via INTRAVENOUS

## 2018-01-08 MED ORDER — PREDNISONE 50 MG PO TABS
150.0000 mg | ORAL_TABLET | Freq: Every day | ORAL | Status: DC
  Administered 2018-01-09: 150 mg via ORAL
  Filled 2018-01-08: qty 3

## 2018-01-08 MED ORDER — SODIUM CHLORIDE 0.9% FLUSH: 3.0000 mL | INTRAVENOUS | Status: DC | PRN

## 2018-01-08 MED ORDER — ASPIRIN 81 MG PO CHEW: 81.0000 mg | CHEWABLE_TABLET | ORAL | Status: DC

## 2018-01-08 MED ORDER — SODIUM CHLORIDE 0.9 % IV SOLN: INTRAVENOUS | Status: DC

## 2018-01-08 MED ORDER — ONDANSETRON HCL 4 MG/2ML IJ SOLN: 4.0000 mg | Freq: Four times a day (QID) | INTRAMUSCULAR | Status: DC | PRN

## 2018-01-08 MED ORDER — SODIUM CHLORIDE 0.9 % IV SOLN
INTRAVENOUS | Status: AC | PRN
  Administered 2018-01-08: 10 mL/h via INTRAVENOUS

## 2018-01-08 MED ORDER — SODIUM CHLORIDE 0.9% FLUSH: 3.0000 mL | Freq: Two times a day (BID) | INTRAVENOUS | Status: DC

## 2018-01-08 MED ORDER — NITROGLYCERIN IN D5W 200-5 MCG/ML-% IV SOLN: 0.0000 ug/min | Freq: Once | INTRAVENOUS | Status: DC

## 2018-01-08 MED ORDER — HEPARIN (PORCINE) IN NACL 100-0.45 UNIT/ML-% IJ SOLN
1150.0000 [IU]/h | INTRAMUSCULAR | Status: DC
  Administered 2018-01-08: 1050 [IU]/h via INTRAVENOUS
  Filled 2018-01-08: qty 250

## 2018-01-08 MED ORDER — VERAPAMIL HCL 2.5 MG/ML IV SOLN
INTRAVENOUS | Status: AC
  Filled 2018-01-08: qty 2

## 2018-01-08 MED ORDER — SODIUM CHLORIDE 0.9 % IV SOLN
INTRAVENOUS | Status: DC
  Administered 2018-01-08: 11:00:00 via INTRAVENOUS

## 2018-01-08 MED ORDER — SODIUM CHLORIDE 0.9 % IV SOLN
INTRAVENOUS | Status: DC
  Administered 2018-01-08: 03:00:00 via INTRAVENOUS

## 2018-01-08 MED ORDER — HEPARIN (PORCINE) IN NACL 1000-0.9 UT/500ML-% IV SOLN
INTRAVENOUS | Status: DC | PRN
  Administered 2018-01-08 (×2): 500 mL

## 2018-01-08 MED ORDER — INSULIN REGULAR(HUMAN) IN NACL 100-0.9 UT/100ML-% IV SOLN
INTRAVENOUS | Status: DC
  Administered 2018-01-08: 2.4 [IU] via INTRAVENOUS
  Filled 2018-01-08: qty 100

## 2018-01-08 MED ORDER — METOPROLOL TARTRATE 25 MG/10 ML ORAL SUSPENSION
6.2500 mg | Freq: Four times a day (QID) | ORAL | Status: DC
  Administered 2018-01-08 – 2018-01-09 (×4): 6.25 mg via ORAL
  Filled 2018-01-08 (×6): qty 5

## 2018-01-08 MED ORDER — SODIUM CHLORIDE 0.9 % IV SOLN: 250.0000 mL | INTRAVENOUS | Status: DC | PRN

## 2018-01-08 MED ORDER — ASPIRIN EC 81 MG PO TBEC
81.0000 mg | DELAYED_RELEASE_TABLET | Freq: Every day | ORAL | Status: DC
  Administered 2018-01-08 – 2018-01-09 (×2): 81 mg via ORAL
  Filled 2018-01-08 (×2): qty 1

## 2018-01-08 MED ORDER — MIDAZOLAM HCL 2 MG/2ML IJ SOLN
INTRAMUSCULAR | Status: AC
  Filled 2018-01-08: qty 2

## 2018-01-08 MED ORDER — SODIUM CHLORIDE 0.9% FLUSH
3.0000 mL | Freq: Two times a day (BID) | INTRAVENOUS | Status: DC
  Administered 2018-01-08 – 2018-01-09 (×2): 3 mL via INTRAVENOUS

## 2018-01-08 MED ORDER — FENTANYL CITRATE (PF) 100 MCG/2ML IJ SOLN
INTRAMUSCULAR | Status: AC
  Filled 2018-01-08: qty 2

## 2018-01-08 MED ORDER — INFLUENZA VAC SPLIT HIGH-DOSE 0.5 ML IM SUSY
0.5000 mL | PREFILLED_SYRINGE | INTRAMUSCULAR | Status: AC
  Administered 2018-01-09: 0.5 mL via INTRAMUSCULAR
  Filled 2018-01-08: qty 0.5

## 2018-01-08 MED ORDER — PREDNISONE 50 MG PO TABS
200.0000 mg | ORAL_TABLET | Freq: Every day | ORAL | Status: AC
  Filled 2018-01-08: qty 4

## 2018-01-08 MED ORDER — PREDNISONE 20 MG PO TABS: 50.0000 mg | ORAL_TABLET | ORAL | Status: DC

## 2018-01-08 MED ORDER — FENTANYL CITRATE (PF) 100 MCG/2ML IJ SOLN
INTRAMUSCULAR | Status: DC | PRN
  Administered 2018-01-08: 25 ug via INTRAVENOUS

## 2018-01-08 MED ORDER — HEPARIN (PORCINE) IN NACL 1000-0.9 UT/500ML-% IV SOLN
INTRAVENOUS | Status: AC
  Filled 2018-01-08: qty 1000

## 2018-01-08 MED ORDER — MIDAZOLAM HCL 2 MG/2ML IJ SOLN
INTRAMUSCULAR | Status: DC | PRN
  Administered 2018-01-08: 1 mg via INTRAVENOUS

## 2018-01-08 MED ORDER — PREDNISONE 50 MG PO TABS: 100.0000 mg | ORAL_TABLET | Freq: Every day | ORAL | Status: DC

## 2018-01-08 MED ORDER — METOPROLOL TARTRATE 12.5 MG HALF TABLET: 6.2500 mg | ORAL_TABLET | Freq: Four times a day (QID) | ORAL | Status: DC

## 2018-01-08 MED ORDER — LIDOCAINE HCL (PF) 1 % IJ SOLN
INTRAMUSCULAR | Status: DC | PRN
  Administered 2018-01-08: 2 mL
  Administered 2018-01-08: 16 mL

## 2018-01-08 MED ORDER — PERFLUTREN LIPID MICROSPHERE
1.0000 mL | INTRAVENOUS | Status: AC | PRN
  Administered 2018-01-08: 2 mL via INTRAVENOUS
  Filled 2018-01-08: qty 10

## 2018-01-08 MED ORDER — IOHEXOL 350 MG/ML SOLN
INTRAVENOUS | Status: DC | PRN
  Administered 2018-01-08: 45 mL

## 2018-01-08 MED ORDER — PREDNISONE 50 MG PO TABS: 50.0000 mg | ORAL_TABLET | Freq: Every day | ORAL | Status: DC

## 2018-01-08 MED ORDER — LIDOCAINE HCL (PF) 1 % IJ SOLN
INTRAMUSCULAR | Status: AC
  Filled 2018-01-08: qty 30

## 2018-01-08 SURGICAL SUPPLY — 10 items
CATH INFINITI 5FR MULTPACK ANG (CATHETERS) ×2 IMPLANT
ELECT DEFIB PAD ADLT CADENCE (PAD) ×2 IMPLANT
GLIDESHEATH SLEND SS 6F .021 (SHEATH) ×2 IMPLANT
KIT HEART LEFT (KITS) ×2 IMPLANT
PACK CARDIAC CATHETERIZATION (CUSTOM PROCEDURE TRAY) ×2 IMPLANT
SHEATH PINNACLE 5F 10CM (SHEATH) ×2 IMPLANT
SHEATH PROBE COVER 6X72 (BAG) ×2 IMPLANT
TRANSDUCER W/STOPCOCK (MISCELLANEOUS) ×2 IMPLANT
TUBING CIL FLEX 10 FLL-RA (TUBING) ×2 IMPLANT
WIRE EMERALD 3MM-J .035X150CM (WIRE) ×2 IMPLANT

## 2018-01-08 NOTE — ED Notes (Signed)
Pt.is getting blood at this time 

## 2018-01-08 NOTE — Progress Notes (Signed)
Progress Note  Patient Name: Franciso Dierks Date of Encounter: 01/08/2018  Primary Cardiologist: [name], MD    Subjective   Still with mild pain   Inpatient Medications    Scheduled Meds: . aspirin EC  81 mg Oral Daily  . metoprolol tartrate  12.5 mg Oral Q6H  . predniSONE  50-200 mg Oral See admin instructions   Continuous Infusions: . sodium chloride 100 mL/hr at 01/08/18 0311  . dextrose 5 % and 0.45% NaCl 50 mL/hr at 01/08/18 0822  . heparin 1,050 Units/hr (01/08/18 0235)  . insulin 4.3 mL/hr at 01/08/18 0602  . nitroGLYCERIN     PRN Meds: acetaminophen, ondansetron (ZOFRAN) IV   Vital Signs    Vitals:   01/08/18 0730 01/08/18 0745 01/08/18 0800 01/08/18 0815  BP: 94/69 99/66 113/80 100/72  Pulse:      Resp: 19 (!) 23 20 (!) 25  Temp:      TempSrc:      SpO2:        Intake/Output Summary (Last 24 hours) at 01/08/2018 6811 Last data filed at 01/08/2018 0602 Gross per 24 hour  Intake 3039.54 ml  Output -  Net 3039.54 ml   There were no vitals filed for this visit.  Telemetry    NSR/ST rates 103 - Personally Reviewed  ECG    SR likely anterolateral MI RBBB  - Personally Reviewed  Physical Exam  Chronically ill white male  GEN: No acute distress.   Neck: No JVD Cardiac: RRR, no murmurs, rubs, or gallops.  Respiratory: diffuse rhonchi GI: Soft, nontender, non-distended  Post umbilical surgery  MS: No edema; No deformity. Neuro:  Nonfocal  Psych: Normal affect   Labs    Chemistry Recent Labs  Lab 01/02/18 1116 01/07/18 2112 01/08/18 0600  NA 138 131* 135  K 4.6 5.6* 5.8*  CL 103 99 106  CO2 23 16* 17*  GLUCOSE 361* 479* 318*  BUN 38* 25* 25*  CREATININE 1.62* 1.88* 1.73*  CALCIUM 8.5* 8.5* 8.4*  PROT 6.1*  --  5.6*  ALBUMIN 2.8*  --  2.7*  AST 86*  --  120*  ALT 207*  --  150*  ALKPHOS 549*  --  346*  BILITOT 2.1*  --  1.7*  GFRNONAA 41* 34* 38*  GFRAA 48* 40* 44*  ANIONGAP 12 16* 12     Hematology Recent Labs    Lab 01/02/18 1116 01/07/18 2112  WBC 16.3* 10.8*  RBC 3.15* 3.15*  HGB 8.5* 8.2*  HCT 27.3* 28.4*  MCV 86.7 90.2  MCH 27.0 26.0  MCHC 31.1 28.9*  RDW 17.7* 18.9*  PLT 310 320    Cardiac Enzymes Recent Labs  Lab 01/08/18 0600  TROPONINI 41.84*    Recent Labs  Lab 01/07/18 2121  TROPIPOC 1.06*     BNPNo results for input(s): BNP, PROBNP in the last 168 hours.   DDimer No results for input(s): DDIMER in the last 168 hours.   Radiology    Dg Chest Portable 1 View  Result Date: 01/07/2018 CLINICAL DATA:  Chest pain hyperglycemia EXAM: PORTABLE CHEST 1 VIEW COMPARISON:  CT 10/31/2016, radiograph 10/03/2017 FINDINGS: Post sternotomy changes. Cardiomegaly. No focal opacity or pleural effusion. No pneumothorax. IMPRESSION: No active disease.  Stable mild cardiomegaly. Electronically Signed   By: Donavan Foil M.D.   On: 01/07/2018 21:38    Cardiac Studies   TTE pending   Patient Profile     72 y.o. male With CABG 2014 ,  anemia, CRF renal cell carninoma with SEMI  Assessment & Plan    SEMI:  He is a borderline candidate for cath However his troponin has bumped to 45 this  Am still with some pain. Feel he should have diagnostic cath to see where he stands will Due stat bedside echo for EF but suspect he has had large anterior MI.  Known graft failure From cath 11/14/16 Not clear that he will have any lesion that can be intervened on but should Look in acute setting Last cath intervention deferred due to anemia and w/u of new kidney Mass.   RCC:  Last CT showed stable metastatic disease and functional status is good post left Nephrectomy with Cr 1.7 limit dye load  CXR this am with NAD despite abnormal exam      For questions or updates, please contact [facility] Please consult [email or web address] for contact info under        Signed, Jenkins Rouge, MD  01/08/2018, 8:38 AM

## 2018-01-08 NOTE — ED Notes (Signed)
Paged admitting to inform of elevated trop of 41.84

## 2018-01-08 NOTE — ED Notes (Signed)
Consent signed and at bedside. Pt resting comfortably. Waiting for cath lab to call.

## 2018-01-08 NOTE — Progress Notes (Signed)
  Echocardiogram 2D Echocardiogram has been performed.  Darlina Sicilian M 01/08/2018, 9:27 AM

## 2018-01-08 NOTE — Consult Note (Signed)
..   NAME:  Trevor [name], MRN:  [id number], DOB:  Feb 06, 1946, LOS: 0 ADMISSION DATE:  01/07/2018, CONSULTATION DATE:  01/08/2018 REFERRING MD:  EDP, CHIEF COMPLAINT:  Chest pressure   Brief History   72 yr old male with PMHx significant for HFrEF, PAD and Stage IV RCC presented on 01/07/18 with chest pressure. Acute Coronary Syndrome- NSTEMI Hemodynamically stable. PCCM consulted for admission  Past Medical History  HFrEF last EF 45% STEMI S/p CABG NSTEMI- 8/18 DM PAD HLD ICM RCC Stage IV TIA CVA OA Significant Hospital Events   NSTEMI  Consults: date of consult/date signed off & final recs:  Cardiology-10/22: heparin gtt nitro gtt atrovastatin 80mg , aspirin 81mg , metoprolol 6.25mg  Q6. LHC pending. Repeat TTR needed  Procedures (surgical and bedside):  IVF resuscitation Heparin gtt Blood transfusion- pending Started on insulin gtt  Significant Diagnostic Tests:  K+ 5.6 Glucose 479 AG 16 LA 6.07 GFR 34 Cr 1.88 Na+ 131   Micro Data:  none  Antimicrobials:  none   Subjective:  Currently laying upright in bed during my evaluation no new complaints  Objective   Blood pressure 114/83, pulse 92, resp. rate (!) 22, SpO2 (!) 87 %.        Intake/Output Summary (Last 24 hours) at 01/08/2018 0306 Last data filed at 01/08/2018 0235 Gross per 24 hour  Intake 2036.66 ml  Output -  Net 2036.66 ml   There were no vitals filed for this visit.  Examination: General: not in severe distress, sitting up HENT: normocephalic atraumatic Lungs: clear to auscultation no use of accessory muscles and regular rate Cardiovascular: S1 and S2 appreciated in regular rhythm and rate Abdomen: soft non tender non distended BS in all 4 quadrants Extremities: no lower ext edema appreciated Neuro: alert and oriented x 3  Assessment & Plan:  1. Acute Coronary syndrome- NSTEMI     Cardiology following - appreciate recs     Heparin gtt     Nitro gtt- as MAP allows     Atorvastatin 80mg   and Aspirin 81 mg     Plan for LHC and repeat TTE per note. 2. Hypertension: BP control with metoprolol 6.25 Q 6 hrs as MAP allows 3. Acute on Chronic Kidney Injury- h/o Stage 4 RCC- his Rx was on hold for hepatitis. Per Oncology regarding restarting immunotherapy. 4.Anion Gap Metabolic Acidosis: elevated Lactate noted pt has a h/o hepatitis, low Albumin along with known h/o HFrEF and now in an acute coronary syndrome- several concomitant processes occurring to explain elevated lactate. Pt remains hemodynamically stable and at this time does not require pressors.  5. Elevated BG noted >:400 no ketones on UA- started on insulin gtt in ED- check BG and transition off of gtt once BG 250mg /dl. AG is most likely due to lactate. Check HgbA1c.       Disposition / Summary of Today's Plan 01/08/18   At the time of my evaluation pt does not require ICU management ot monitoring. We appreciate the consult and if pt's clinical status should decline please re-consult     Diet: NPO for now-> possible LHC Pain/Anxiety/Delirium protocol (if indicated): per primary team admitting VAP protocol (if indicated): not indicated DVT prophylaxis: Heparin gtt GI prophylaxis: not indicated ( not on steroids and not intubated) Hyperglycemia protocol: insulin gtt--> transition to ISS Mobility: bedrest Code Status: DNR  Labs   CBC: Recent Labs  Lab 01/02/18 1116 01/07/18 2112  WBC 16.3* 10.8*  NEUTROABS 13.4*  --   HGB 8.5* 8.2*  HCT 27.3* 28.4*  MCV 86.7 90.2  PLT 310 397    Basic Metabolic Panel: Recent Labs  Lab 01/02/18 1116 01/07/18 2112  NA 138 131*  K 4.6 5.6*  CL 103 99  CO2 23 16*  GLUCOSE 361* 479*  BUN 38* 25*  CREATININE 1.62* 1.88*  CALCIUM 8.5* 8.5*   GFR: Estimated Creatinine Clearance: 38.4 mL/min (A) (by C-G formula based on SCr of 1.88 mg/dL (H)). Recent Labs  Lab 01/02/18 1116 01/07/18 2112 01/07/18 2314  WBC 16.3* 10.8*  --   LATICACIDVEN  --   --  6.07*    Liver  Function Tests: Recent Labs  Lab 01/02/18 1116  AST 86*  ALT 207*  ALKPHOS 549*  BILITOT 2.1*  PROT 6.1*  ALBUMIN 2.8*   No results for input(s): LIPASE, AMYLASE in the last 168 hours. No results for input(s): AMMONIA in the last 168 hours.  ABG    Component Value Date/Time   PHART 7.361 07/05/2012 2013   PCO2ART 37.9 07/05/2012 2013   PO2ART 78.0 (L) 07/05/2012 2013   HCO3 15.9 (L) 01/08/2018 0003   TCO2 17 (L) 01/08/2018 0003   ACIDBASEDEF 8.0 (H) 01/08/2018 0003   O2SAT 68.0 01/08/2018 0003     Coagulation Profile: No results for input(s): INR, PROTIME in the last 168 hours.  Cardiac Enzymes: No results for input(s): CKTOTAL, CKMB, CKMBINDEX, TROPONINI in the last 168 hours.  HbA1C: Hgb A1c MFr Bld  Date/Time Value Ref Range Status  02/19/2017 08:59 AM 6.7 (H) 4.8 - 5.6 % Final    Comment:    (NOTE)         Prediabetes: 5.7 - 6.4         Diabetes: >6.4         Glycemic control for adults with diabetes: <7.0   11/20/2016 07:04 AM 5.9 (H) 4.8 - 5.6 % Final    Comment:    (NOTE) Pre diabetes:          5.7%-6.4% Diabetes:              >6.4% Glycemic control for   <7.0% adults with diabetes     CBG: Recent Labs  Lab 01/07/18 2135  GLUCAP 422*    Admitting History of Present Illness.   72 yr old male with PMHx significant for multivessel CAD, IDDM, w/ CABG in 2014, HFrEF 45%, PAD, Stage IV RCC previously on immunotherapy held  due to hepatitis p/w chest pressure and dyspnea on exertion that began 2 hrs prior to presentation as heaviness in both arms followed by an episode of diaphoresis. It then evolved into a dull heavy chest pressure located at midsternum and did not radiate. He denies any fevers, chills, cough, leg swelling or other symptoms.   On presentation also noted to be hyperglycemic and has a h/o starting prednisone last week. While being managed in the ED he reported that the pain was increasing and radiating to his legs  Initial EKG showed  Marked ST abnormality with possible inferior subendocardial injury and anterior infarct could not be ruled out.   Pt was evaluated by Cardiology for mgmt of NSTEMI Pt was started on heparin gtt, atorvastatin and aspirin with metoprolol 6.25 mg Q 6 Also noted to have Hgb 8.2- symptomatic anemia ( possible Type II NSTEMI)- one unit of prbc ordered for transfusion PCCM was consulted for admission  due to elevated lactate.  Review of Systems:   Marland KitchenMarland KitchenReview of Systems  Constitutional: Positive for diaphoresis.  HENT:  Negative.   Eyes: Negative.   Respiratory: Positive for shortness of breath.   Cardiovascular: Positive for chest pain. Negative for leg swelling.  Gastrointestinal: Negative.   Genitourinary: Negative.   Musculoskeletal: Negative.   Skin: Negative.   Neurological: Negative.   Endo/Heme/Allergies: Negative.      Past Medical History  He,  has a past medical history of 3-vessel coronary artery disease, Anemia, Arthritis, Carotid disease, bilateral ([place]), Chronic systolic heart failure ([place]), Coronary artery disease, Diabetes mellitus without complication ([place]), Diarrhea, Dyspnea, History of blood transfusion, Hyperlipidemia, Ischemic cardiomyopathy, Mild mitral regurgitation, Numbness, OA (osteoarthritis), PAD (peripheral artery disease) ([place]), Pulmonary nodule, Renal cell cancer, left ([place]) (12/2016), Stroke ([place]) (2018), TIA (transient ischemic attack) (10/2016), Weakness generalized, and Wears glasses.   Surgical History    Past Surgical History:  Procedure Laterality Date  . CARDIOVASCULAR STRESS TEST    . CORONARY ARTERY BYPASS GRAFT N/A 07/05/2012   Procedure: CORONARY ARTERY BYPASS GRAFTING (CABG);  Surgeon: Ivin Poot, MD;  Location: [facility];  Service: Open Heart Surgery;  Laterality: N/A;  . INTRAOPERATIVE TRANSESOPHAGEAL ECHOCARDIOGRAM N/A 07/05/2012   Procedure: INTRAOPERATIVE TRANSESOPHAGEAL ECHOCARDIOGRAM;  Surgeon: Ivin Poot, MD;  Location: [facility];  Service:  Open Heart Surgery;  Laterality: N/A;  . LEFT HEART CATH AND CORS/GRAFTS ANGIOGRAPHY N/A 11/14/2016   Procedure: LEFT HEART CATH AND CORS/GRAFTS ANGIOGRAPHY;  Surgeon: Belva Crome, MD;  Location: [facility];  Service: Cardiovascular;  Laterality: N/A;  . LEFT HEART CATHETERIZATION WITH CORONARY ANGIOGRAM N/A 07/04/2012   Procedure: LEFT HEART CATHETERIZATION WITH CORONARY ANGIOGRAM;  Surgeon: Wellington Hampshire, MD;  Location: [facility];  Service: Cardiovascular;  Laterality: N/A;  . RENAL BIOPSY    . ROBOT ASSISTED LAPAROSCOPIC NEPHRECTOMY Left 02/21/2017   Procedure: XI ROBOTIC ASSISTED LAPAROSCOPIC NEPHRECTOMY;  Surgeon: Alexis Frock, MD;  Location: [facility];  Service: Urology;  Laterality: Left;     Social History   Social History   Socioeconomic History  . Marital status: Widowed    Spouse name: Not on file  . Number of children: Not on file  . Years of education: Not on file  . Highest education level: Not on file  Occupational History  . Occupation: Retired Fish farm manager  . Financial resource strain: Not on file  . Food insecurity:    Worry: Not on file    Inability: Not on file  . Transportation needs:    Medical: Not on file    Non-medical: Not on file  Tobacco Use  . Smoking status: Former Smoker    Packs/day: 1.00    Years: 50.00    Pack years: 50.00    Last attempt to quit: 04/20/2012    Years since quitting: 5.7  . Smokeless tobacco: Never Used  Substance and Sexual Activity  . Alcohol use: No  . Drug use: No  . Sexual activity: Not on file    Comment: retired, lives with sister and husband  Lifestyle  . Physical activity:    Days per week: Not on file    Minutes per session: Not on file  . Stress: Not on file  Relationships  . Social connections:    Talks on phone: Not on file    Gets together: Not on file    Attends religious service: Not on file    Active member of club or organization: Not on file    Attends meetings of clubs  or organizations: Not on file    Relationship  status: Not on file  . Intimate partner violence:    Fear of current or ex partner: Not on file    Emotionally abused: Not on file    Physically abused: Not on file    Forced sexual activity: Not on file  Other Topics Concern  . Not on file  Social History Narrative   Lives with sister.  ,  reports that he quit smoking about 5 years ago. He has a 50.00 pack-year smoking history. He has never used smokeless tobacco. He reports that he does not drink alcohol or use drugs.   Family History   His family history includes Cancer in his maternal grandfather and mother; Heart attack (age of onset: 60) in his brother; Stroke in his mother.   Allergies No Known Allergies   Home Medications  Prior to Admission medications   Medication Sig Start Date End Date Taking? Authorizing Provider  aspirin EC 81 MG tablet Take 81 mg by mouth daily.   Yes [provider]  atorvastatin (LIPITOR) 40 MG tablet Take 1 tablet (40 mg total) by mouth daily. 05/20/13  Yes Wellington Hampshire, MD  carvedilol (COREG) 6.25 MG tablet Take 1 tablet (6.25 mg total) by mouth 2 (two) times daily. 08/28/17  Yes Wellington Hampshire, MD  ferrous sulfate 325 (65 FE) MG tablet Take 325 mg by mouth daily with breakfast.   Yes [provider]  metFORMIN (GLUCOPHAGE) 1000 MG tablet Take 1 tablet (1,000 mg total) by mouth 2 (two) times daily. 04/21/17  Yes Rai, Ripudeep K, MD  naproxen sodium (ALEVE) 220 MG tablet Take 440 mg by mouth daily as needed (arthritis).   Yes [provider]  predniSONE (DELTASONE) 50 MG tablet 4 tablet p.o. daily x1 week, followed by 3 tablets p.o. daily x1 week, followed by 2 tablets p.o. daily x1 week, then 1 tablet p.o. daily x1 week. Patient taking differently: Take 50-200 mg by mouth See admin instructions. 4 tablet p.o. daily x1 week, followed by 3 tablets p.o. daily x1 week, followed by 2 tablets p.o. daily x1 week, then 1 tablet p.o.  daily x1 week. 12/27/17  Yes Curt Bears, MD  mirtazapine (REMERON) 15 MG tablet TAKE 2 TABLETS(30 MG) BY MOUTH AT BEDTIME Patient not taking: Reported on 01/07/2018 12/27/17   Curt Bears, MD  ondansetron (ZOFRAN ODT) 4 MG disintegrating tablet Take 1 tablet (4 mg total) by mouth every 8 (eight) hours as needed for nausea or vomiting. Patient not taking: Reported on 11/01/2017 04/20/17   Rai, Vernelle Emerald, MD  pantoprazole (PROTONIX) 40 MG tablet Take 1 tablet (40 mg total) by mouth at bedtime. Patient not taking: Reported on 01/07/2018 11/22/16   Charm Rings, NP  prochlorperazine (COMPAZINE) 10 MG tablet Take 1 tablet (10 mg total) by mouth every 6 (six) hours as needed for nausea or vomiting. Patient not taking: Reported on 11/01/2017 04/20/17   Mendel Corning, MD     Critical care time: 55 mins     I, Dr Seward Carol have personally reviewed patient's available data, including medical history, events of note, physical examination and test results as part of my evaluation. I have discussed with NP Moshe Cipro and other care providers such as pharmacist, RN and Elink.  In addition,  I personally evaluated the patient.  The patient is critically ill with multiple organ systems failure and requires high complexity decision making for assessment and support, frequent evaluation and titration of therapies, application of advanced monitoring technologies and  extensive interpretation of multiple databases.   Critical Care Time devoted to patient care services described in this note is 70  Minutes. This time reflects time of care of this signee Dr Seward Carol. This critical care time does not reflect procedure time, or teaching time or supervisory time of NP but could involve care discussion time   Dr. Seward Carol Pulmonary Critical Care Medicine  01/08/2018 4:27 AM

## 2018-01-08 NOTE — ED Notes (Signed)
Updated patient's sister Remo Lipps per pt's request.

## 2018-01-08 NOTE — Progress Notes (Signed)
PROGRESS NOTE  Brief Narrative: Montell Leopard is a 72 y.o. male with a history of CAD s/p CABG and known failed graft, chronic combined CHF, stage III CKD, RCC s/p nephrectomy and treatment complicated by drug-induced hepatitis managed with steroids, and chronic anemia who presented with acute chest pain with sensation of heaviness extending to bilateral arms. There were significant ST abnormalities on ECG, CXR without acute findings. Potassium 5.6, bicarb 16 with creatinine slightly up from baseline at 1.88. Glucose 479 with elevated anion gap concerning for DKA. Hgb 8.2 (slightly below baseline). Initial troponin around 1 and cardiology was consulted. Insulin gtt and heparin gtt started and the patient was admitted earlier this morning by Dr. Myna Hidalgo. Troponin trended up to 45 and cardiac catheterization is planned.   Subjective: Chest discomfort remains, better than at admission. Has not gotten up but denies dyspnea at rest despite being on oxygen.   Objective: BP 91/64   Pulse 99   Temp 97.8 F (36.6 C) (Oral)   Resp 19   SpO2 100%   Gen: Chronicall ill-appearing pleasant male Pulm: Clear and nonlabored with supplemental oxygen   CV: RRR, no murmur, no JVD, no edema GI: Soft, NT, ND, +BS  Neuro: Alert and oriented. No focal deficits. Skin: No rashes, lesions or ulcers. Sternotomy scar noted.  Assessment & Plan: Principal Problem:   NSTEMI (non-ST elevated myocardial infarction) ([place]) Active Problems:   Iron deficiency anemia due to chronic blood loss   History of CVA (cerebrovascular accident)   Renal cell cancer, left (H[facility])   Chronic combined systolic and diastolic heart failure ([facility])   Diabetes mellitus type 2 in nonobese (HCC)   Hypertension   Drug-induced hepatitis   CKD (chronic kidney disease), stage III (HCC)   Hyperkalemia   DKA (diabetic ketoacidoses) (HCC)  NSTEMI:  - Cardiac catheterization urgently per cardiology  Acute hypoxic respiratory failure:  - Continue  supplemental oxygen as we treat underlying causes.   DKA: Only on metformin as outpatient, suspect due to steroids and stress due to MI (had very similar presentation with initial MI) - Continue insulin gtt. Unclear what his basal-bolus requirement will be, will need to calculate based on insulin gtt requirements.  Stage III CKD: Cr improving.  - Minimize dye load, but agree risk < benefit of cath at this time.  - Hydrate as able (concern with CHF, need to avoid overload)  Hyperkalemia: More predominantly due to hyperglycemia than renal impairment.  - Monitor with serial BMP, anticipate improvement with IVF, insulin  Chronic combined CHF:  - Monitor I/O, weights  RCC s/p nephrectomy:  - Holding nivolumab due to hepatitis which is managed with prednisone.  - Dr. Julien Nordmann added to treatment team as FYI  Anemia of chronic disease: s/p 1u PRBCs in ED for hgb 8.2 with active angina.  - Monitor  Full plan per H&P this AM by Dr. Myna Hidalgo.  Patrecia Pour, MD 01/08/2018, 12:08 PM

## 2018-01-08 NOTE — ED Notes (Signed)
Admitting Md at bedside

## 2018-01-08 NOTE — ED Notes (Signed)
MD Bonner Puna informed on elevated trop- he states to page cardiology to inform of elevated trop.

## 2018-01-08 NOTE — H&P (Signed)
History and Physical    Trevor [name] SEG:[id number] DOB: 08-25-1945 DOA: 01/07/2018  PCP: [name], MD   Patient coming from: Home   Chief Complaint: Chest pain, hyperglycemia   HPI: Trevor [name] is a 72 y.o. male with medical history significant for CAD status post CABG, chronic combined systolic and diastolic CHF, chronic kidney disease stage III, chronic normocytic anemia, and renal cell cancer status post nephrectomy with treatment recently complicated by drug-induced hepatitis, now presenting to the emergency department for evaluation of chest pain.  Patient reports that he been in his usual state until yesterday afternoon when he developed acute onset pain in his chest with heaviness sensation in bilateral arms and dyspnea.  Reports having a fairly normal day leading up to this and denies any recent cough, dysuria, abdominal pain, vomiting, or diarrhea.  He called EMS and was treated with a full dose aspirin prior to arrival in the ED.  ED Course: Upon arrival to the ED, patient is found to be afebrile, saturating in the 80s, mildly tachypneic, and with blood pressure 90/63.  EKG features a sinus rhythm with marked ST abnormalities.  Chest x-ray was negative for acute findings, notable for stable mild cardiomegaly.  Chemistry panel features a sodium of 131, potassium 5.6, bicarbonate 16, anion gap 16, glucose 479, and creatinine of 1.88, slightly higher than priors.  CBC is notable for mild leukocytosis and a normocytic anemia with hemoglobin of 8.2.  Lactic acid was elevated to 6.07 and troponin elevated to 1.06.  Critical care was consulted by the ED physician and recommended medical admission.  Cardiology was consulted by the ED physician and has given recommendations.  A unit of red blood cells was ordered, patient was started on heparin infusion, given a liter of normal saline, and started on insulin drip.  He will be admitted to the stepdown unit for ongoing evaluation and  management.  Review of Systems:  All other systems reviewed and apart from HPI, are negative.  Past Medical History:  Diagnosis Date  . 3-vessel coronary artery disease   . Anemia    2 iron infusions  . Arthritis   . Carotid disease, bilateral (HCC)    moderate  . Chronic systolic heart failure (HCC)    EF 45%  . Coronary artery disease    06/2012:STEMI s/p CABG; 8/18 NSTEMI  . Diabetes mellitus without complication ([place])   . Diarrhea   . Dyspnea    mild  . History of blood transfusion   . Hyperlipidemia   . Ischemic cardiomyopathy    Ejection fraction of 35-40% initially, 45 - 50% on echo 2014, 53% by perfusion study 2016  . Mild mitral regurgitation   . Numbness    Right side since stroke  . OA (osteoarthritis)   . PAD (peripheral artery disease) ([place])   . Pulmonary nodule   . Renal cell cancer, left ([place]) 12/2016   Associated w/ pulmonary nodules  . Stroke ([place]) 2018  . TIA (transient ischemic attack) 10/2016  . Weakness generalized   . Wears glasses     Past Surgical History:  Procedure Laterality Date  . CARDIOVASCULAR STRESS TEST    . CORONARY ARTERY BYPASS GRAFT N/A 07/05/2012   Procedure: CORONARY ARTERY BYPASS GRAFTING (CABG);  Surgeon: Ivin Poot, MD;  Location: [facility];  Service: Open Heart Surgery;  Laterality: N/A;  . INTRAOPERATIVE TRANSESOPHAGEAL ECHOCARDIOGRAM N/A 07/05/2012   Procedure: INTRAOPERATIVE TRANSESOPHAGEAL ECHOCARDIOGRAM;  Surgeon: Ivin Poot, MD;  Location: [facility];  Service: Open Heart Surgery;  Laterality: N/A;  . LEFT HEART CATH AND CORS/GRAFTS ANGIOGRAPHY N/A 11/14/2016   Procedure: LEFT HEART CATH AND CORS/GRAFTS ANGIOGRAPHY;  Surgeon: Belva Crome, MD;  Location: [facility];  Service: Cardiovascular;  Laterality: N/A;  . LEFT HEART CATHETERIZATION WITH CORONARY ANGIOGRAM N/A 07/04/2012   Procedure: LEFT HEART CATHETERIZATION WITH CORONARY ANGIOGRAM;  Surgeon: Wellington Hampshire, MD;  Location: [facility];  Service:  Cardiovascular;  Laterality: N/A;  . RENAL BIOPSY    . ROBOT ASSISTED LAPAROSCOPIC NEPHRECTOMY Left 02/21/2017   Procedure: XI ROBOTIC ASSISTED LAPAROSCOPIC NEPHRECTOMY;  Surgeon: Alexis Frock, MD;  Location: [facility];  Service: Urology;  Laterality: Left;     reports that he quit smoking about 5 years ago. He has a 50.00 pack-year smoking history. He has never used smokeless tobacco. He reports that he does not drink alcohol or use drugs.  No Known Allergies  Family History  Problem Relation Age of Onset  . Cancer Mother        leukemia  . Stroke Mother   . Cancer Maternal Grandfather        possible cancer  . Heart attack Brother 50     Prior to Admission medications   Medication Sig Start Date End Date Taking? Authorizing Provider  aspirin EC 81 MG tablet Take 81 mg by mouth daily.   Yes [provider]  atorvastatin (LIPITOR) 40 MG tablet Take 1 tablet (40 mg total) by mouth daily. 05/20/13  Yes Wellington Hampshire, MD  carvedilol (COREG) 6.25 MG tablet Take 1 tablet (6.25 mg total) by mouth 2 (two) times daily. 08/28/17  Yes Wellington Hampshire, MD  ferrous sulfate 325 (65 FE) MG tablet Take 325 mg by mouth daily with breakfast.   Yes [provider]  metFORMIN (GLUCOPHAGE) 1000 MG tablet Take 1 tablet (1,000 mg total) by mouth 2 (two) times daily. 04/21/17  Yes Rai, Ripudeep K, MD  naproxen sodium (ALEVE) 220 MG tablet Take 440 mg by mouth daily as needed (arthritis).   Yes [provider]  predniSONE (DELTASONE) 50 MG tablet 4 tablet p.o. daily x1 week, followed by 3 tablets p.o. daily x1 week, followed by 2 tablets p.o. daily x1 week, then 1 tablet p.o. daily x1 week. Patient taking differently: Take 50-200 mg by mouth See admin instructions. 4 tablet p.o. daily x1 week, followed by 3 tablets p.o. daily x1 week, followed by 2 tablets p.o. daily x1 week, then 1 tablet p.o. daily x1 week. 12/27/17  Yes Curt Bears, MD  mirtazapine (REMERON) 15 MG tablet  TAKE 2 TABLETS(30 MG) BY MOUTH AT BEDTIME Patient not taking: Reported on 01/07/2018 12/27/17   Curt Bears, MD  ondansetron (ZOFRAN ODT) 4 MG disintegrating tablet Take 1 tablet (4 mg total) by mouth every 8 (eight) hours as needed for nausea or vomiting. Patient not taking: Reported on 11/01/2017 04/20/17   Rai, Vernelle Emerald, MD  pantoprazole (PROTONIX) 40 MG tablet Take 1 tablet (40 mg total) by mouth at bedtime. Patient not taking: Reported on 01/07/2018 11/22/16   Charm Rings, NP  prochlorperazine (COMPAZINE) 10 MG tablet Take 1 tablet (10 mg total) by mouth every 6 (six) hours as needed for nausea or vomiting. Patient not taking: Reported on 11/01/2017 04/20/17   Mendel Corning, MD    Physical Exam: Vitals:   01/08/18 0345 01/08/18 0415 01/08/18 0425 01/08/18 0445  BP: 111/78 115/84 115/78 100/78  Pulse: (!) 102 (!) 103 Marland Kitchen)  102 (!) 103  Resp: (!) 21 16 18  (!) 22  Temp:   97.8 F (36.6 C)   TempSrc:   Oral   SpO2: 100% 100% 100% 100%    Constitutional: NAD, calm  Eyes: PERTLA, lids and conjunctivae normal ENMT: Mucous membranes are moist. Posterior pharynx clear of any exudate or lesions.   Neck: normal, supple, no masses, no thyromegaly Respiratory: clear to auscultation bilaterally, no wheezing, no crackles. Normal respiratory effort.    Cardiovascular: S1 & S2 heard, regular rate and rhythm. No extremity edema.   Abdomen: No distension, no tenderness, soft. Bowel sounds normal.  Musculoskeletal: no clubbing / cyanosis. No joint deformity upper and lower extremities.    Skin: no significant rashes, lesions, ulcers. Warm, dry, well-perfused. Neurologic: CN 2-12 grossly intact. Sensation intact. Strength 5/5 in all 4 limbs.  Psychiatric:  Alert and oriented x 3. Calm, cooperative.    Labs on Admission: I have personally reviewed following labs and imaging studies  CBC: Recent Labs  Lab 01/02/18 1116 01/07/18 2112  WBC 16.3* 10.8*  NEUTROABS 13.4*  --   HGB 8.5*  8.2*  HCT 27.3* 28.4*  MCV 86.7 90.2  PLT 310 381   Basic Metabolic Panel: Recent Labs  Lab 01/02/18 1116 01/07/18 2112  NA 138 131*  K 4.6 5.6*  CL 103 99  CO2 23 16*  GLUCOSE 361* 479*  BUN 38* 25*  CREATININE 1.62* 1.88*  CALCIUM 8.5* 8.5*   GFR: Estimated Creatinine Clearance: 38.4 mL/min (A) (by C-G formula based on SCr of 1.88 mg/dL (H)). Liver Function Tests: Recent Labs  Lab 01/02/18 1116  AST 86*  ALT 207*  ALKPHOS 549*  BILITOT 2.1*  PROT 6.1*  ALBUMIN 2.8*   No results for input(s): LIPASE, AMYLASE in the last 168 hours. No results for input(s): AMMONIA in the last 168 hours. Coagulation Profile: No results for input(s): INR, PROTIME in the last 168 hours. Cardiac Enzymes: No results for input(s): CKTOTAL, CKMB, CKMBINDEX, TROPONINI in the last 168 hours. BNP (last 3 results) No results for input(s): PROBNP in the last 8760 hours. HbA1C: No results for input(s): HGBA1C in the last 72 hours. CBG: Recent Labs  Lab 01/07/18 2135 01/08/18 0432  GLUCAP 422* 301*   Lipid Profile: No results for input(s): CHOL, HDL, LDLCALC, TRIG, CHOLHDL, LDLDIRECT in the last 72 hours. Thyroid Function Tests: No results for input(s): TSH, T4TOTAL, FREET4, T3FREE, THYROIDAB in the last 72 hours. Anemia Panel: No results for input(s): VITAMINB12, FOLATE, FERRITIN, TIBC, IRON, RETICCTPCT in the last 72 hours. Urine analysis:    Component Value Date/Time   COLORURINE YELLOW 04/17/2017 [place] 04/17/2017 1525   LABSPEC 1.012 04/17/2017 1525   PHURINE 5.0 04/17/2017 1525   GLUCOSEU NEGATIVE 04/17/2017 1525   HGBUR NEGATIVE 04/17/2017 1525   BILIRUBINUR NEGATIVE 04/17/2017 1525   KETONESUR NEGATIVE 04/17/2017 1525   PROTEINUR NEGATIVE 04/17/2017 1525   UROBILINOGEN 0.2 07/04/2012 1852   NITRITE NEGATIVE 04/17/2017 1525   LEUKOCYTESUR NEGATIVE 04/17/2017 1525   Sepsis Labs: @LABRCNTIP (procalcitonin:4,lacticidven:4) )No results found for this or  any previous visit (from the past 240 hour(s)).   Radiological Exams on Admission: Dg Chest Portable 1 View  Result Date: 01/07/2018 CLINICAL DATA:  Chest pain hyperglycemia EXAM: PORTABLE CHEST 1 VIEW COMPARISON:  CT 10/31/2016, radiograph 10/03/2017 FINDINGS: Post sternotomy changes. Cardiomegaly. No focal opacity or pleural effusion. No pneumothorax. IMPRESSION: No active disease.  Stable mild cardiomegaly. Electronically Signed   By: Madie Reno.D.  On: 01/07/2018 21:38    EKG: Independently reviewed. Sinus rhythm, marked ST-abnormalities.   Assessment/Plan   1. NSTEMI - Presents with acute-onset of chest pain with ED workup concerning for NSTEMI  - Cardiology is consulting and much appreciated  - Treated with ASA 324 mg prior to arrival in ED, started on heparin and NTG infusions in ED  - Continue IV heparin, NTG, Lopressor as tolerated, ASA; given recent hepatitis, check LFT's prior to resuming Lipitor    2. DKA  - A1c was 6.7% last year, managed with metformin at home  - Serum glucose is 479 in ED with low bicarbonate and elevated anion gap  - Likely d/t steroids +/- acute MI  - Started on insulin infusion in ED with frequent CBG's and serial chem panels    3. Renal cell carcinoma   - Follows with Dr. Julien Nordmann of oncology  - Status-post nephrectomy  - Treatment with nivolumab on hold d/t development of hepatitis, currently being treated with prednisone   4. Drug-induced hepatitis  - Developed significant elevations in LFT's earlier this month, attributed by oncology to nivolumab  - Nivolumab has been held and patient treated with prednisone taper  - Check LFT's, continue prednisone taper   5. CKD III  - SCr is 1.88 on admission, up from an apparent baseline of ~1.6  - Renally-dose medications, avoid nephrotoxins where possible, repeat chem panel    6. Normocytic anemia  - Hgb is 8.2 on admission with recent priors in 8.5-10 range and no active bleeding noted  - He  was transfused with 1 unit RBC in ED  - Check post-transfusion CBC    7. Chronic combined CHF  - Appears euvolemic  - Will update echo per cardiology recs, follow daily wt and I/O's    8. Elevated lactate  - Lactic acid elevated to 6.07 in ED  - Does not appear to be septic and this is likely multifactorial  - Will trend; anticipate improvement with treatment of acute conditions as above   9. Acute hypoxic respiratory failure  - Saturating mid-80's on rm air in ED  - No acute findings on CXR  - He has improved in ED and is not in any apparent distress - Continue supplemental O2 as-needed   10. Hyperkalemia  - Serum potassium is 5.6 in ED  - Anticipate resolution with insulin and IVF  - Continue cardiac monitoring, continue insulin and IVF, serial chem panels as above    DVT prophylaxis: IV heparin infusion  Code Status: DNR  Family Communication: Family updated at bedside Consults called: PCCM, cardiology  Admission status: Inpatient     Vianne Bulls, MD [facility] Pager [phone]  If 7PM-7AM, please contact night-coverage [email or web address] Password [email or web address]  01/08/2018, 5:25 AM

## 2018-01-08 NOTE — ED Notes (Signed)
Report given to crystal Bain RN, pt being moved to POD E to wait for cath lab/ stepdown bed.

## 2018-01-08 NOTE — Progress Notes (Signed)
[facility] for heparin Indication: 3 vessel CAD  No Known Allergies  Patient Measurements:   Heparin Dosing Weight: 78.9kg  Vital Signs: BP: 104/71 (10/22 1630) Pulse Rate: 113 (10/22 1630)  Labs: Recent Labs    01/07/18 2112 01/08/18 0600 01/08/18 0615 01/08/18 0915 01/08/18 1701  HGB 8.2*  --   --   --   --   HCT 28.4*  --   --   --   --   PLT 320  --   --   --   --   HEPARINUNFRC  --   --  0.44  --   --   CREATININE 1.88* 1.73*  --  1.68* 1.69*  TROPONINI  --  41.84*  --  45.83* 20.32*    Estimated Creatinine Clearance: 42.7 mL/min (A) (by C-G formula based on SCr of 1.69 mg/dL (H)).  Assessment: 72 yo man s/p cath which showed 3 vessel CAD. Plans are for medical management only, no plans for further PCI or CABG d/t comorbid conditions. Heparin to restart 8-hours after sheath removal. Sheath was removed at about 1500. Pt was previously therapeutic at heparin 1050 units/hr with HL of 0.44; will continue this dose.  Goal of Therapy:  Heparin level 0.3-0.7 units/ml Monitor platelets by anticoagulation protocol: Yes   Plan:  At 2300, restart Heparin drip at 1050 units/hr 8-hour HL at 0700 Daily heparin level and CBC   Gladis Soley J Jahniah Pallas 01/08/2018,6:38 PM

## 2018-01-08 NOTE — ED Notes (Signed)
Paged APP for cardiology to inform about trop.

## 2018-01-08 NOTE — Progress Notes (Signed)
ANTICOAGULATION CONSULT NOTE  Pharmacy Consult for heparin Indication: CP  No Known Allergies  Patient Measurements:   Heparin Dosing Weight: 78.9kg  Vital Signs: Temp: 97.8 F (36.6 C) (10/22 0425) Temp Source: Oral (10/22 0425) BP: 94/69 (10/22 0730) Pulse Rate: 103 (10/22 0445)  Labs: Recent Labs    01/07/18 2112 01/08/18 0600 01/08/18 0615  HGB 8.2*  --   --   HCT 28.4*  --   --   PLT 320  --   --   HEPARINUNFRC  --   --  0.44  CREATININE 1.88* 1.73*  --   TROPONINI  --  41.84*  --     Estimated Creatinine Clearance: 41.7 mL/min (A) (by C-G formula based on SCr of 1.73 mg/dL (H)).  Assessment: 72 yo man to start heparin for CP. Initial heparin level is therapeutic at 0.44. No bleeding noted and he is not on anticoagulation PTA.   Goal of Therapy:  Heparin level 0.3-0.7 units/ml Monitor platelets by anticoagulation protocol: Yes   Plan:  Continue Heparin drip at 1050 units/hr Daily heparin level and CBC  Keandre Linden, Rande Lawman 01/08/2018,7:51 AM

## 2018-01-08 NOTE — Interval H&P Note (Signed)
History and Physical Interval Note:  01/08/2018 1:20 PM  Trevor [name]  has presented today for surgery, with the diagnosis of cp  The various methods of treatment have been discussed with the patient and family. After consideration of risks, benefits and other options for treatment, the patient has consented to  Procedure(s): LEFT HEART CATH AND CORS/GRAFTS ANGIOGRAPHY (N/A) as a surgical intervention .  The patient's history has been reviewed, patient examined, no change in status, stable for surgery.  I have reviewed the patient's chart and labs.  Questions were answered to the patient's satisfaction.     Collier Salina [facility] 01/08/2018 1:20 PM

## 2018-01-08 NOTE — ED Notes (Signed)
Paged MD Johnsie Cancel, to make aware of elevated trop 45.83. Cath Lab called and states possible call time 12-1300pm.

## 2018-01-08 NOTE — ED Notes (Signed)
Checked patient cbg it was 122 notified RN Crystal of blood sugar patient is resting with call bell in reach

## 2018-01-08 NOTE — H&P (View-Only) (Signed)
Progress Note  Patient Name: Trevor [name] Date of Encounter: 01/08/2018  Primary Cardiologist: [name], MD    Subjective   Still with mild pain   Inpatient Medications    Scheduled Meds: . aspirin EC  81 mg Oral Daily  . metoprolol tartrate  12.5 mg Oral Q6H  . predniSONE  50-200 mg Oral See admin instructions   Continuous Infusions: . sodium chloride 100 mL/hr at 01/08/18 0311  . dextrose 5 % and 0.45% NaCl 50 mL/hr at 01/08/18 0822  . heparin 1,050 Units/hr (01/08/18 0235)  . insulin 4.3 mL/hr at 01/08/18 0602  . nitroGLYCERIN     PRN Meds: acetaminophen, ondansetron (ZOFRAN) IV   Vital Signs    Vitals:   01/08/18 0730 01/08/18 0745 01/08/18 0800 01/08/18 0815  BP: 94/69 99/66 113/80 100/72  Pulse:      Resp: 19 (!) 23 20 (!) 25  Temp:      TempSrc:      SpO2:        Intake/Output Summary (Last 24 hours) at 01/08/2018 9702 Last data filed at 01/08/2018 0602 Gross per 24 hour  Intake 3039.54 ml  Output -  Net 3039.54 ml   There were no vitals filed for this visit.  Telemetry    NSR/ST rates 103 - Personally Reviewed  ECG    SR likely anterolateral MI RBBB  - Personally Reviewed  Physical Exam  Chronically ill white male  GEN: No acute distress.   Neck: No JVD Cardiac: RRR, no murmurs, rubs, or gallops.  Respiratory: diffuse rhonchi GI: Soft, nontender, non-distended  Post umbilical surgery  MS: No edema; No deformity. Neuro:  Nonfocal  Psych: Normal affect   Labs    Chemistry Recent Labs  Lab 01/02/18 1116 01/07/18 2112 01/08/18 0600  NA 138 131* 135  K 4.6 5.6* 5.8*  CL 103 99 106  CO2 23 16* 17*  GLUCOSE 361* 479* 318*  BUN 38* 25* 25*  CREATININE 1.62* 1.88* 1.73*  CALCIUM 8.5* 8.5* 8.4*  PROT 6.1*  --  5.6*  ALBUMIN 2.8*  --  2.7*  AST 86*  --  120*  ALT 207*  --  150*  ALKPHOS 549*  --  346*  BILITOT 2.1*  --  1.7*  GFRNONAA 41* 34* 38*  GFRAA 48* 40* 44*  ANIONGAP 12 16* 12     Hematology Recent Labs    Lab 01/02/18 1116 01/07/18 2112  WBC 16.3* 10.8*  RBC 3.15* 3.15*  HGB 8.5* 8.2*  HCT 27.3* 28.4*  MCV 86.7 90.2  MCH 27.0 26.0  MCHC 31.1 28.9*  RDW 17.7* 18.9*  PLT 310 320    Cardiac Enzymes Recent Labs  Lab 01/08/18 0600  TROPONINI 41.84*    Recent Labs  Lab 01/07/18 2121  TROPIPOC 1.06*     BNPNo results for input(s): BNP, PROBNP in the last 168 hours.   DDimer No results for input(s): DDIMER in the last 168 hours.   Radiology    Dg Chest Portable 1 View  Result Date: 01/07/2018 CLINICAL DATA:  Chest pain hyperglycemia EXAM: PORTABLE CHEST 1 VIEW COMPARISON:  CT 10/31/2016, radiograph 10/03/2017 FINDINGS: Post sternotomy changes. Cardiomegaly. No focal opacity or pleural effusion. No pneumothorax. IMPRESSION: No active disease.  Stable mild cardiomegaly. Electronically Signed   By: Donavan Foil M.D.   On: 01/07/2018 21:38    Cardiac Studies   TTE pending   Patient Profile     72 y.o. male With CABG 2014 ,  anemia, CRF renal cell carninoma with SEMI  Assessment & Plan    SEMI:  He is a borderline candidate for cath However his troponin has bumped to 45 this  Am still with some pain. Feel he should have diagnostic cath to see where he stands will Due stat bedside echo for EF but suspect he has had large anterior MI.  Known graft failure From cath 11/14/16 Not clear that he will have any lesion that can be intervened on but should Look in acute setting Last cath intervention deferred due to anemia and w/u of new kidney Mass.   RCC:  Last CT showed stable metastatic disease and functional status is good post left Nephrectomy with Cr 1.7 limit dye load  CXR this am with NAD despite abnormal exam      For questions or updates, please contact [place] Please consult [email or web address] for contact info under        Signed, Jenkins Rouge, MD  01/08/2018, 8:38 AM

## 2018-01-08 NOTE — Progress Notes (Signed)
Site area: rt groin fa sheath Site Prior to Removal:  Level 0 Pressure Applied For:  20 minutes Manual:   yes Patient Status During Pull:  stable Post Pull Site:  Level  0 Post Pull Instructions Given:  yes Post Pull Pulses Present: rt dp palpable Dressing Applied:  Gauze and tegaderm Bedrest begins @ 1500 Comments:

## 2018-01-08 NOTE — Consult Note (Signed)
CARDIOLOGY CONSULT NOTE   Patient ID: Trevor [name] MRN: [id number], DOB/AGE: 1945-04-17   Admit date: 01/07/2018 Date of Consult: 01/08/2018   Primary Physician: Trevor Kid, MD Primary Cardiologist: Trevor Mages. [name] Physician: ED Reason for Consult: chest perssure  Pt. Profile 72 yo male w/ known CABG (2014), NSTEMI w/ symp anemia (LHC 2018, no revasc at that time), HFrEF (45%) , PAD and stage IV RCC who presents with chest pain, found to have NSTEMI  Problem List  Past Medical History:  Diagnosis Date  . 3-vessel coronary artery disease   . Anemia    2 iron infusions  . Arthritis   . Carotid disease, bilateral (HCC)    moderate  . Chronic systolic heart failure (HCC)    EF 45%  . Coronary artery disease    06/2012:STEMI s/p CABG; 8/18 NSTEMI  . Diabetes mellitus without complication ([place])   . Diarrhea   . Dyspnea    mild  . History of blood transfusion   . Hyperlipidemia   . Ischemic cardiomyopathy    Ejection fraction of 35-40% initially, 45 - 50% on echo 2014, 53% by perfusion study 2016  . Mild mitral regurgitation   . Numbness    Right side since stroke  . OA (osteoarthritis)   . PAD (peripheral artery disease) ([place])   . Pulmonary nodule   . Renal cell cancer, left ([place]) 12/2016   Associated w/ pulmonary nodules  . Stroke ([place]) 2018  . TIA (transient ischemic attack) 10/2016  . Weakness generalized   . Wears glasses     Past Surgical History:  Procedure Laterality Date  . CARDIOVASCULAR STRESS TEST    . CORONARY ARTERY BYPASS GRAFT N/A 07/05/2012   Procedure: CORONARY ARTERY BYPASS GRAFTING (CABG);  Surgeon: Trevor Poot, MD;  Location: [place];  Service: Open Heart Surgery;  Laterality: N/A;  . INTRAOPERATIVE TRANSESOPHAGEAL ECHOCARDIOGRAM N/A 07/05/2012   Procedure: INTRAOPERATIVE TRANSESOPHAGEAL ECHOCARDIOGRAM;  Surgeon: Trevor Poot, MD;  Location: [place];  Service: Open Heart Surgery;  Laterality: N/A;  . LEFT HEART CATH AND CORS/GRAFTS  ANGIOGRAPHY N/A 11/14/2016   Procedure: LEFT HEART CATH AND CORS/GRAFTS ANGIOGRAPHY;  Surgeon: Trevor Crome, MD;  Location: [facility];  Service: Cardiovascular;  Laterality: N/A;  . LEFT HEART CATHETERIZATION WITH CORONARY ANGIOGRAM N/A 07/04/2012   Procedure: LEFT HEART CATHETERIZATION WITH CORONARY ANGIOGRAM;  Surgeon: Trevor Hampshire, MD;  Location: [facility];  Service: Cardiovascular;  Laterality: N/A;  . RENAL BIOPSY    . ROBOT ASSISTED LAPAROSCOPIC NEPHRECTOMY Left 02/21/2017   Procedure: XI ROBOTIC ASSISTED LAPAROSCOPIC NEPHRECTOMY;  Surgeon: Trevor Frock, MD;  Location: [facility];  Service: Urology;  Laterality: Left;     Allergies  No Known Allergies  HPI  [name] reports today an acute onset of bilateral arm heaviness and chest pressure pain this evening. He also become diaphoretic and checked his glucose. Seeing a reading of > 400, he called EMS which confirmed this. Brought to ED. Had episode of low O2 sat, requiring NRB for a few minutes. Some subjective SOB.   He continued to report bilateral arm heaviness. These symptoms have been progressing over the last several days. However today the would not be relieved with rest.   Denies any bleeding from rectum, no melena/hematochezia/hematemsis.   Inpatient Medications  . sodium chloride   Intravenous Once    Family History Family History  Problem Relation Age of Onset  . Cancer Mother  leukemia  . Stroke Mother   . Cancer Maternal Grandfather        possible cancer  . Heart attack Brother 34     Social History Social History   Socioeconomic History  . Marital status: Widowed    Spouse name: Not on file  . Number of children: Not on file  . Years of education: Not on file  . Highest education level: Not on file  Occupational History  . Occupation: Retired Fish farm manager  . Financial resource strain: Not on file  . Food insecurity:    Worry: Not on file    Inability: Not on  file  . Transportation needs:    Medical: Not on file    Non-medical: Not on file  Tobacco Use  . Smoking status: Former Smoker    Packs/day: 1.00    Years: 50.00    Pack years: 50.00    Last attempt to quit: 04/20/2012    Years since quitting: 5.7  . Smokeless tobacco: Never Used  Substance and Sexual Activity  . Alcohol use: No  . Drug use: No  . Sexual activity: Not on file    Comment: retired, lives with sister and husband  Lifestyle  . Physical activity:    Days per week: Not on file    Minutes per session: Not on file  . Stress: Not on file  Relationships  . Social connections:    Talks on phone: Not on file    Gets together: Not on file    Attends religious service: Not on file    Active member of club or organization: Not on file    Attends meetings of clubs or organizations: Not on file    Relationship status: Not on file  . Intimate partner violence:    Fear of current or ex partner: Not on file    Emotionally abused: Not on file    Physically abused: Not on file    Forced sexual activity: Not on file  Other Topics Concern  . Not on file  Social History Narrative   Lives with sister.     Review of Systems  General:  No chills, fever, night sweats or weight changes.  Cardiovascular:  +chest pain, +dyspnea on exertion, no edema, orthopnea, palpitations, paroxysmal nocturnal dyspnea. Dermatological: No rash, lesions/masses Respiratory: No cough, dyspnea Urologic: No hematuria, dysuria Abdominal:   No nausea, vomiting, diarrhea, bright red blood per rectum, melena, or hematemesis Neurologic:  No visual changes, wkns, changes in mental status. All other systems reviewed and are otherwise negative except as noted above.  Physical Exam  Blood pressure 107/75, pulse 92, resp. rate (!) 21, SpO2 (!) 87 %.  General: chronically ill appearing, on [place] nwo Psych: Normal affect. Neuro: Alert and oriented X 3. Moves all extremities spontaneously. HEENT: Normal  Neck:  Supple without bruits or JVD. Lungs:  Resp regular and unlabored, CTA. Heart: RRR no s3, s4, or murmurs. Abdomen: Soft, non-tender, non-distended, BS + x 4.  Extremities: No clubbing, cyanosis or edema. DP/PT/Radials 2+ and equal bilaterally.  Labs  No results for input(s): CKTOTAL, CKMB, TROPONINI in the last 72 hours. Lab Results  Component Value Date   WBC 10.8 (H) 01/07/2018   HGB 8.2 (L) 01/07/2018   HCT 28.4 (L) 01/07/2018   MCV 90.2 01/07/2018   PLT 320 01/07/2018    Recent Labs  Lab 01/02/18 1116 01/07/18 2112  NA 138 131*  K 4.6 5.6*  CL 103 99  CO2 23 16*  BUN 38* 25*  CREATININE 1.62* 1.88*  CALCIUM 8.5* 8.5*  PROT 6.1*  --   BILITOT 2.1*  --   ALKPHOS 549*  --   ALT 207*  --   AST 86*  --   GLUCOSE 361* 479*   Lab Results  Component Value Date   CHOL 77 11/20/2016   HDL 30 (L) 11/20/2016   LDLCALC 34 11/20/2016   TRIG 64 11/20/2016   No results found for: DDIMER  Radiology/Studies  Dg Chest Portable 1 View  Result Date: 01/07/2018 CLINICAL DATA:  Chest pain hyperglycemia EXAM: PORTABLE CHEST 1 VIEW COMPARISON:  CT 10/31/2016, radiograph 10/03/2017 FINDINGS: Post sternotomy changes. Cardiomegaly. No focal opacity or pleural effusion. No pneumothorax. IMPRESSION: No active disease.  Stable mild cardiomegaly. Electronically Signed   By: Donavan Foil M.D.   On: 01/07/2018 21:38    ECG Sinus, RBB with lateral ST depressions  LHC 2018  Severe diffuse native coronary occlusive disease.  Saphenous vein graft failure.  Total occlusion of the saphenous vein sequential graft to the PDA and PL branch.  Total occlusion of the saphenous vein graft to the ramus intermedius.  Patent LIMA to the LAD mid to distal segment.  Total occlusion of the proximal LAD. 80-90% distal left main.  Ostial 90% circumflex stenosis. 90% ostial stenosis of the first obtuse marginal.  70% ostial and 75% mid ramus intermedius stenosis.  Segmental 90-95% ostial to  proximal RCA stenosis. High-grade obstruction, greater than 90% in the mid body of the PDA and 95% obstruction in the PL branch.  Acute on chronic systolic heart failure with left ventricular end-diastolic pressure of 22 mmHg. Echocardiographic ejection fraction 20-25%. 10/2016 EF 45-50%  ========================= ASSESSMENT AND PLAN 72 yo male w/ known CABG (2014), NSTEMI w/ symp anemia (LHC 2018, no revasc at that time), mHFrEF (45%) , PAD, CKD and stage IV RCC (nephrectomy, chemo/immuno) who presents with chest pain, found to have NSTEMI  # NSTEMI # ACS - symptoms likely driven by ischemia given ECG changes - heparin gtt - nitro gtt as tolerated, if BP drops could try low-dose morphine to control chest pain - atorva 80mg  and aspirin 81mg   - metop 6.25mg  q6grs as pressures tolerate - likely will need LHC however need  - hydratiojn given Cr 1.88 and K 5.6 - repeat TTE to evaluate for new WMA and EF  # RCC cancer: treatment on hold 2/2 hepatitis. Appears that once hepatitis resolves, ?restart immunotherapy.   Signed, Charlies Silvers, MD Cardiology Fellow Overnight

## 2018-01-09 ENCOUNTER — Encounter (HOSPITAL_COMMUNITY): Payer: Self-pay | Admitting: Cardiology

## 2018-01-09 DIAGNOSIS — E119 Type 2 diabetes mellitus without complications: Secondary | ICD-10-CM

## 2018-01-09 DIAGNOSIS — Z8673 Personal history of transient ischemic attack (TIA), and cerebral infarction without residual deficits: Secondary | ICD-10-CM

## 2018-01-09 LAB — CBC
HCT: 31.6 % — ABNORMAL LOW (ref 39.0–52.0)
Hemoglobin: 9.5 g/dL — ABNORMAL LOW (ref 13.0–17.0)
MCH: 26.9 pg (ref 26.0–34.0)
MCHC: 30.1 g/dL (ref 30.0–36.0)
MCV: 89.5 fL (ref 80.0–100.0)
PLATELETS: 212 10*3/uL (ref 150–400)
RBC: 3.53 MIL/uL — AB (ref 4.22–5.81)
RDW: 18.9 % — ABNORMAL HIGH (ref 11.5–15.5)
WBC: 17.7 10*3/uL — ABNORMAL HIGH (ref 4.0–10.5)
nRBC: 0 % (ref 0.0–0.2)

## 2018-01-09 LAB — BPAM RBC
Blood Product Expiration Date: 201911242359
ISSUE DATE / TIME: 201910220258
UNIT TYPE AND RH: 5100

## 2018-01-09 LAB — TYPE AND SCREEN
ABO/RH(D): O POS
ANTIBODY SCREEN: NEGATIVE
Unit division: 0

## 2018-01-09 LAB — GLUCOSE, CAPILLARY
GLUCOSE-CAPILLARY: 281 mg/dL — AB (ref 70–99)
Glucose-Capillary: 163 mg/dL — ABNORMAL HIGH (ref 70–99)
Glucose-Capillary: 241 mg/dL — ABNORMAL HIGH (ref 70–99)

## 2018-01-09 LAB — HEMOGLOBIN A1C
HEMOGLOBIN A1C: 6.7 % — AB (ref 4.8–5.6)
Mean Plasma Glucose: 145.59 mg/dL

## 2018-01-09 LAB — HEPARIN LEVEL (UNFRACTIONATED): HEPARIN UNFRACTIONATED: 0.28 [IU]/mL — AB (ref 0.30–0.70)

## 2018-01-09 MED ORDER — ISOSORBIDE MONONITRATE ER 30 MG PO TB24: 30.0000 mg | ORAL_TABLET | Freq: Every day | ORAL | 0 refills | Status: AC

## 2018-01-09 MED ORDER — INSULIN ASPART 100 UNIT/ML ~~LOC~~ SOLN
0.0000 [IU] | Freq: Three times a day (TID) | SUBCUTANEOUS | Status: DC
  Administered 2018-01-09 (×2): 8 [IU] via SUBCUTANEOUS

## 2018-01-09 MED ORDER — CLOPIDOGREL BISULFATE 75 MG PO TABS: 75.0000 mg | ORAL_TABLET | Freq: Every day | ORAL | 0 refills | Status: AC

## 2018-01-09 MED ORDER — ISOSORBIDE MONONITRATE ER 30 MG PO TB24
30.0000 mg | ORAL_TABLET | Freq: Every day | ORAL | Status: DC
Start: 2018-01-09 — End: 2018-01-09
  Administered 2018-01-09: 30 mg via ORAL
  Filled 2018-01-09: qty 1

## 2018-01-09 MED ORDER — INSULIN GLARGINE 100 UNIT/ML ~~LOC~~ SOLN: 20.0000 [IU] | Freq: Every day | SUBCUTANEOUS | Status: DC

## 2018-01-09 MED ORDER — CLOPIDOGREL BISULFATE 75 MG PO TABS
75.0000 mg | ORAL_TABLET | Freq: Every day | ORAL | Status: DC
Start: 2018-01-09 — End: 2018-01-09
  Administered 2018-01-09: 75 mg via ORAL
  Filled 2018-01-09: qty 1

## 2018-01-09 MED ORDER — INSULIN ASPART 100 UNIT/ML ~~LOC~~ SOLN: 0.0000 [IU] | Freq: Every day | SUBCUTANEOUS | Status: DC

## 2018-01-09 MED ORDER — INSULIN ASPART 100 UNIT/ML ~~LOC~~ SOLN: 5.0000 [IU] | Freq: Three times a day (TID) | SUBCUTANEOUS | Status: DC

## 2018-01-09 MED ORDER — SITAGLIPTIN PHOSPHATE 50 MG PO TABS: 50.0000 mg | ORAL_TABLET | Freq: Every day | ORAL | 0 refills | Status: AC

## 2018-01-09 MED FILL — Verapamil HCl IV Soln 2.5 MG/ML: INTRAVENOUS | Qty: 2 | Status: AC

## 2018-01-09 NOTE — Progress Notes (Signed)
Inpatient Diabetes Program Recommendations  AACE/ADA: New Consensus Statement on Inpatient Glycemic Control (2019)  Target Ranges:  Prepandial:   less than 140 mg/dL      Peak postprandial:   less than 180 mg/dL (1-2 hours)      Critically ill patients:  140 - 180 mg/dL   Results for TYRI, ELMORE (MRN [id number]) as of 01/09/2018 09:08  Ref. Range 01/08/2018 07:01 01/08/2018 08:09 01/08/2018 09:26 01/08/2018 10:36 01/08/2018 12:05 01/08/2018 13:22 01/08/2018 14:39 01/08/2018 15:41 01/08/2018 17:02 01/08/2018 21:01 01/09/2018 07:33  Glucose-Capillary Latest Ref Range: 70 - 99 mg/dL 259 (H) 204 (H) 176 (H) 129 (H) 122 (H) 117 (H) 139 (H) 123 (H) 188 (H) 226 (H) 163 (H)  Results for TYMEER, VAQUERA (MRN [id number]) as of 01/09/2018 09:08  Ref. Range 01/07/2018 21:12  Glucose Latest Ref Range: 70 - 99 mg/dL 479 (H)    Review of Glycemic Control  Diabetes history: DM2 Outpatient Diabetes medications: Metformin 1000 mg BID Current orders for Inpatient glycemic control: IV insulin drip  Inpatient Diabetes Program Recommendations:  Insulin - IV drip/GlucoStabilizer: Noted patient was initially on IV insulin drip and it appears per chart review that insulin drip has been off since 14:40 on 01/08/18. Fasting glucose is 163 mg/dl today. Correction (SSI): Please consider ordering CBGs with Novolog 0-9 units Q4H. HgbA1C: Noted A1C is ordered. Anticipate it will be elevated as patient has been on high dose steroids.   NOTE: Noted patient has been on high dose steroids recently which is likely cause of hyperglycemia.  Per chart review it appears that insulin drip was stopped around 14:40 on 01/08/18. Spoke with RN and she confirms that insulin drip is not infusing at this time. Also spoke with Dr. Wyline Copas and he has ordered Novolog SQ and is not planning to discharge patient on insulin. If in the future, patient is ordered steroids he may need to take insulin for hyperglycemia as needed.  Thanks, Barnie Alderman, RN, MSN, CDE Diabetes Coordinator Inpatient Diabetes Program [phone] (Team Pager from 8am to 5pm)

## 2018-01-09 NOTE — Discharge Summary (Signed)
Physician Discharge Summary  Diar Berkel RXV:[id number] DOB: [birth year] DOA: 01/07/2018  PCP: [name], MD  Admit date: 01/07/2018 Discharge date: 01/09/2018  Admitted From: Home Disposition:  Home  Recommendations for Outpatient Follow-up:  1. Follow up with PCP in 1-2 weeks 2. Recommend repeat bmet in 1 week, PCP to follow up   Discharge Condition:Stable CODE STATUS:DNR Diet recommendation: Diabetic   Brief/Interim Summary: 72 y.o. male with a history of CAD s/p CABG and known failed graft, chronic combined CHF, stage III CKD, RCC s/p nephrectomy and treatment complicated by drug-induced hepatitis managed with steroids, and chronic anemia who presented with acute chest pain with sensation of heaviness extending to bilateral arms. There were significant ST abnormalities on ECG, CXR without acute findings. Potassium 5.6, bicarb 16 with creatinine slightly up from baseline at 1.88. Glucose 479 with elevated anion gap concerning for DKA. Hgb 8.2 (slightly below baseline). Initial troponin around 1 and cardiology was consulted. Insulin gtt and heparin gtt started and the patient was admitted earlier this morning by Dr. Myna Hidalgo. Troponin trended up to 45 and cardiac catheterization is planned.   NSTEMI:  - Cardiac catheterization urgently per cardiology  Acute hypoxic respiratory failure:  - resolved, back to room air  DKA: Only on metformin as outpatient, suspect due to steroids  -DKA resolved -Will discharge on januvia instead of metformin given renal insufficiency  Stage III CKD: Cr improving.  - Remained stable  Hyperkalemia: More predominantly due to hyperglycemia than renal impairment.  - resolved -Would continue to monitor as outpatient  Chronic combined CHF:  - Remained stable this admission  RCC s/p nephrectomy:  - Held nivolumab due to hepatitis which is managed with prednisone.  - Dr. Julien Nordmann added to treatment team as FYI  Anemia of chronic disease: s/p 1u  PRBCs in ED for hgb 8.2 with active angina.  - Remained hemodynamically stable  Discharge Diagnoses:  Principal Problem:   NSTEMI (non-ST elevated myocardial infarction) ([place]) Active Problems:   Iron deficiency anemia due to chronic blood loss   History of CVA (cerebrovascular accident)   Renal cell cancer, left ([place])   Chronic combined systolic and diastolic heart failure ([place])   Diabetes mellitus type 2 in nonobese ([place])   Hypertension   Drug-induced hepatitis   CKD (chronic kidney disease), stage III ([place])   Hyperkalemia   DKA (diabetic ketoacidoses) ([place])  Discharge Instructions  Allergies as of 01/09/2018   No Known Allergies     Medication List    STOP taking these medications   metFORMIN 1000 MG tablet Commonly known as:  GLUCOPHAGE   mirtazapine 15 MG tablet Commonly known as:  REMERON   naproxen sodium 220 MG tablet Commonly known as:  ALEVE   pantoprazole 40 MG tablet Commonly known as:  PROTONIX   prochlorperazine 10 MG tablet Commonly known as:  COMPAZINE     TAKE these medications   aspirin EC 81 MG tablet Take 81 mg by mouth daily.   atorvastatin 40 MG tablet Commonly known as:  LIPITOR Take 1 tablet (40 mg total) by mouth daily.   carvedilol 6.25 MG tablet Commonly known as:  COREG Take 1 tablet (6.25 mg total) by mouth 2 (two) times daily.   clopidogrel 75 MG tablet Commonly known as:  PLAVIX Take 1 tablet (75 mg total) by mouth daily. Start taking on:  01/10/2018   ferrous sulfate 325 (65 FE) MG tablet Take 325 mg by mouth daily with breakfast.   isosorbide mononitrate 30 MG 24  hr tablet Commonly known as:  IMDUR Take 1 tablet (30 mg total) by mouth daily. Start taking on:  01/10/2018   ondansetron 4 MG disintegrating tablet Commonly known as:  ZOFRAN-ODT Take 1 tablet (4 mg total) by mouth every 8 (eight) hours as needed for nausea or vomiting.   predniSONE 50 MG tablet Commonly known as:  DELTASONE 4 tablet p.o. daily x1  week, followed by 3 tablets p.o. daily x1 week, followed by 2 tablets p.o. daily x1 week, then 1 tablet p.o. daily x1 week. What changed:    how much to take  how to take this  when to take this   sitaGLIPtin 50 MG tablet Commonly known as:  JANUVIA Take 1 tablet (50 mg total) by mouth daily.      Follow-up Information    Via, [name], MD. Schedule an appointment as soon as possible for a visit in 2 week(s).   Specialty:  Family Medicine Contact information: [place] [address] [phone]        Wellington Hampshire, MD .   Specialty:  Cardiology Contact information: [address] [place] [address] [phone]          No Known Allergies  Consultations:  Cardiology  Procedures/Studies: Dg Chest Portable 1 View  Result Date: 01/07/2018 CLINICAL DATA:  Chest pain hyperglycemia EXAM: PORTABLE CHEST 1 VIEW COMPARISON:  CT 10/31/2016, radiograph 10/03/2017 FINDINGS: Post sternotomy changes. Cardiomegaly. No focal opacity or pleural effusion. No pneumothorax. IMPRESSION: No active disease.  Stable mild cardiomegaly. Electronically Signed   By: Donavan Foil M.D.   On: 01/07/2018 21:38    Subjective: Eager to go home  Discharge Exam: Vitals:   01/09/18 1140 01/09/18 1144  BP:  104/71  Pulse:  (!) 101  Resp:    Temp: 98.1 F (36.7 C)   SpO2:     Vitals:   01/09/18 1000 01/09/18 1100 01/09/18 1140 01/09/18 1144  BP: 109/75 111/81  104/71  Pulse: (!) 104 (!) 107  (!) 101  Resp: 17 14    Temp:   98.1 F (36.7 C)   TempSrc:      SpO2: 97% 97%    Weight:        General: Pt is alert, awake, not in acute distress Cardiovascular: RRR, S1/S2 +, no rubs, no gallops Respiratory: CTA bilaterally, no wheezing, no rhonchi Abdominal: Soft, NT, ND, bowel sounds + Extremities: no edema, no cyanosis   The results of significant diagnostics from this hospitalization (including imaging, microbiology, ancillary and laboratory) are  listed below for reference.     Microbiology: No results found for this or any previous visit (from the past 240 hour(s)).   Labs: BNP (last 3 results) No results for input(s): BNP in the last 8760 hours. Basic Metabolic Panel: Recent Labs  Lab 01/07/18 2112 01/08/18 0600 01/08/18 0915 01/08/18 1701  NA 131* 135 135 138  K 5.6* 5.8* 5.1 5.1  CL 99 106 110 112*  CO2 16* 17* 15* 15*  GLUCOSE 479* 318* 188* 223*  BUN 25* 25* 26* 26*  CREATININE 1.88* 1.73* 1.68* 1.69*  CALCIUM 8.5* 8.4* 8.1* 8.2*   Liver Function Tests: Recent Labs  Lab 01/08/18 0600  AST 120*  ALT 150*  ALKPHOS 346*  BILITOT 1.7*  PROT 5.6*  ALBUMIN 2.7*   No results for input(s): LIPASE, AMYLASE in the last 168 hours. No results for input(s): AMMONIA in the last 168 hours. CBC: Recent Labs  Lab  01/07/18 2112 01/09/18 0225  WBC 10.8* 17.7*  HGB 8.2* 9.5*  HCT 28.4* 31.6*  MCV 90.2 89.5  PLT 320 212   Cardiac Enzymes: Recent Labs  Lab 01/08/18 0600 01/08/18 0915 01/08/18 1701  TROPONINI 41.84* 45.83* 20.32*   BNP: Invalid input(s): POCBNP CBG: Recent Labs  Lab 01/08/18 1541 01/08/18 1702 01/08/18 2101 01/09/18 0733 01/09/18 1136  GLUCAP 123* 188* 226* 163* 241*   D-Dimer No results for input(s): DDIMER in the last 72 hours. Hgb A1c Recent Labs    01/09/18 0225  HGBA1C 6.7*   Lipid Profile No results for input(s): CHOL, HDL, LDLCALC, TRIG, CHOLHDL, LDLDIRECT in the last 72 hours. Thyroid function studies No results for input(s): TSH, T4TOTAL, T3FREE, THYROIDAB in the last 72 hours.  Invalid input(s): FREET3 Anemia work up No results for input(s): VITAMINB12, FOLATE, FERRITIN, TIBC, IRON, RETICCTPCT in the last 72 hours. Urinalysis    Component Value Date/Time   COLORURINE YELLOW 04/17/2017 1525   APPEARANCEUR CLEAR 04/17/2017 1525   LABSPEC 1.012 04/17/2017 1525   PHURINE 5.0 04/17/2017 1525   GLUCOSEU NEGATIVE 04/17/2017 1525   HGBUR NEGATIVE 04/17/2017 [place] 04/17/2017 1525   KETONESUR NEGATIVE 04/17/2017 1525   PROTEINUR NEGATIVE 04/17/2017 1525   UROBILINOGEN 0.2 07/04/2012 1852   NITRITE NEGATIVE 04/17/2017 [place] 04/17/2017 1525   Sepsis Labs Invalid input(s): PROCALCITONIN,  WBC,  LACTICIDVEN Microbiology No results found for this or any previous visit (from the past 240 hour(s)).  Time spent: 30 min  SIGNED:   Marylu Lund, MD  [facility] 01/09/2018, 3:49 PM  If 7PM-7AM, please contact night-coverage

## 2018-01-09 NOTE — Progress Notes (Signed)
Discharge instructions (including medications) discussed with and copy provided to patient/caregiver 

## 2018-01-09 NOTE — Progress Notes (Signed)
ANTICOAGULATION CONSULT NOTE  Pharmacy Consult for heparin Indication: 3 vessel CAD  No Known Allergies  Patient Measurements: Weight: 179 lb 7.3 oz (81.4 kg) Heparin Dosing Weight: 78.9kg  Vital Signs: Temp: 97.8 F (36.6 C) (10/23 0734) Temp Source: Oral (10/23 0734) BP: 95/73 (10/23 0734) Pulse Rate: 93 (10/23 0734)  Labs: Recent Labs    01/07/18 2112 01/08/18 0600 01/08/18 0615 01/08/18 0915 01/08/18 1701 01/09/18 0225 01/09/18 0727  HGB 8.2*  --   --   --   --  9.5*  --   HCT 28.4*  --   --   --   --  31.6*  --   PLT 320  --   --   --   --  212  --   HEPARINUNFRC  --   --  0.44  --   --   --  0.28*  CREATININE 1.88* 1.73*  --  1.68* 1.69*  --   --   TROPONINI  --  41.84*  --  45.83* 20.32*  --   --     Estimated Creatinine Clearance: 42.7 mL/min (A) (by C-G formula based on SCr of 1.69 mg/dL (H)).  Assessment: 72 yo man s/p cath which showed 3 vessel CAD. Plans are for medical management only, no plans for further PCI or CABG d/t comorbid conditions. Heparin restarted 8-hours after sheath removal yesterday.  Planning to continue IV heparin for 48 hrs post-infarct per discussion with Dr. Johnsie Cancel.  Heparin level slightly low this AM.  CBC stable, no overt bleeding or complications noted.  Goal of Therapy:  Heparin level 0.3-0.7 units/ml Monitor platelets by anticoagulation protocol: Yes   Plan:  Increase IV heparin to 1150 units/hr Recheck heparin level in 8 hrs Daily heparin level and CBC.  Marguerite Olea, [facility] Clinical Pharmacist Phone [phone]  01/09/2018 10:34 AM

## 2018-01-09 NOTE — Plan of Care (Signed)

## 2018-01-09 NOTE — Discharge Instructions (Signed)
Acute Coronary Syndrome °Acute coronary syndrome (ACS) is a serious problem in which there is suddenly not enough blood and oxygen reaching the heart. ACS can result in chest pain or a heart attack. °What are the causes? °This condition may be caused by: °· A buildup of fat and cholesterol inside of the arteries (atherosclerosis). This is the most common cause. The buildup (plaque) can cause the blood vessels in your heart (coronary arteries) to become narrow or blocked. Plaque can also break off to form a clot. °· A coronary spasm. °· A tearing of the coronary artery (spontaneous coronary artery dissection). °· Low blood pressure (hypotension). °· An abnormal heart beat (arrhythmia). °· Using cocaine or methamphetamine. ° °What increases the risk? °The following factors may make you more likely to develop this condition: °· Age. °· History of chest pain, heart attack, or stroke. °· Being male. °· Family history of chest pain, heart disease, or stroke. °· Smoking. °· Inactivity. °· Being overweight. °· High cholesterol. °· High blood pressure (hypertension). °· Diabetes. °· Excessive alcohol use. ° °What are the signs or symptoms? °Common symptoms of this condition include: °· Chest pain. The pain may last long, or may stop and come back (recur). It may feel like: °? Crushing or squeezing. °? Tightness, pressure, fullness, or heaviness. °· Arm, neck, jaw, or back pain. °· Heartburn or indigestion. °· Shortness of breath. °· Nausea. °· Sudden cold sweats. °· Lightheadedness. °· Dizziness. °· Tiredness (fatigue). ° °Sometimes there are no symptoms. °How is this diagnosed? °This condition may be diagnosed through: °· An electrocardiogram (ECG). This test records the impulses of the heart. °· Blood tests. °· A CT scan of the chest. °· A coronary angiogram. This procedure checks for a blockage in the coronary arteries. ° °How is this treated? °Treatment for this condition may include: °· Oxygen. °· Medicines, such  as: °? Antiplatelet medicines and blood-thinning medicines, such as aspirin. These help prevent blood clots. °? Fibrinolytic therapy. This breaks apart a blood clot. °? Blood pressure medicines. °? Nitroglycerin. °? Pain medicine. °? Cholesterol medicine. °· A procedure called coronary angioplasty and stenting. This is done to widen a narrowed artery and keep it open. °· Coronary artery bypass surgery. This allows blood to pass the blockage to reach your heart. °· Cardiac rehabilitation. This is a program that helps improve your health and well-being. It includes exercise training, education, and counseling to help you recover. ° °Follow these instructions at home: °Eating and drinking °· Follow a heart-healthy, low-salt (sodium) diet. °· Use healthy cooking methods such as roasting, grilling, broiling, baking, poaching, steaming, or stir-frying. °· Talk to a dietitian to learn about healthy cooking methods and how to eat less sodium. °Medicines °· Take over-the-counter and prescription medicines only as told by your health care provider. °· Do not take these medicines unless your health care provider approves: °? Nonsteroidal anti-inflammatory drugs (NSAIDs), such as ibuprofen, naproxen, or celecoxib. °? Vitamin supplements that contain vitamin A or vitamin E. °? Hormone replacement therapy that contains estrogen. °Activity °· Join a cardiac rehabilitation program. °· Ask your health care provider: °? What activities and exercises are safe for you. °? If you should follow specific instructions about lifting, driving, or climbing stairs. °· If you are taking aspirin and another blood thinning medicine, avoid activities that are likely to result in an injury. The medicines can increase your risk of bleeding. °Lifestyle °· Do not use any products that contain nicotine or tobacco, such as cigarettes   and e-cigarettes. If you need help quitting, ask your health care provider. °· If you drink alcohol and your health care  provider says it is okay to drink, limit your alcohol intake to no more than 1 drink per day. One drink equals 12 oz of beer, 5 oz of wine, or 1½ oz of hard liquor. °· Maintain a healthy weight. If you need to lose weight, do it in a way that has been approved by your health care provider. °General instructions °· Tell all your health care providers about your heart condition, including your dentist. Some medicines can increase your risk of arrhythmia. °· Manage other health conditions, such as hypertension and diabetes. These conditions affect your heart. °· Learn ways to manage stress. °· Get screened for depression, and seek treatment if needed. °· Monitor your blood pressure if told by your health care provider. °· Keep your vaccinations up to date. Get the annual influenza vaccine. °· Keep all follow-up visits as told by your health care provider. This is important. °Contact a health care provider if: °· You feel overwhelmed or sad. °· You have trouble with your daily activities. °Get help right away if: °· You have pain in your chest, neck, arm, jaw, stomach, or back that recurs, and: °? Lasts more than a few minutes. °? Is not relieved by taking the medicineyour health care provider prescribed. °· You have unexplained: °? Heavy sweating. °? Heartburn or indigestion. °? Shortness of breath. °? Difficulty breathing. °? Nausea or vomiting. °? Fatigue. °? Nervousness or anxiety. °? Weakness. °? Diarrhea. °? Dark stools or blood in the stool. °· You have sudden lightheadedness or dizziness. °· Your blood pressure is higher than 180/120 °· You faint. °· You feel like hurting yourself or think about taking your own life. °These symptoms may represent a serious problem that is an emergency. Do not wait to see if the symptoms will go away. Get medical help right away. Call your local emergency services (911 in the [place] Do not drive yourself to the clinic or hospital. °Summary °· Acute coronary syndrome (ACS) is a  when there is not enough blood and oxygen being supplied to the heart. ACS can result in chest pain or a heart attack. °· Acute coronary syndrome is a medical emergency. If you have any symptoms of this condition, get help right away. °· Treatment includes oxygen, medicines, and procedures to open the blocked arteries and restore blood flow. °This information is not intended to replace advice given to you by your health care provider. Make sure you discuss any questions you have with your health care provider. °Document Released: 03/06/2005 Document Revised: 04/07/2016 Document Reviewed: 04/07/2016 °Elsevier Interactive Patient Education © 2018 [employer] ° ° °Aspirin and Your Heart °Aspirin is a medicine that affects the way blood clots. Aspirin can be used to help reduce the risk of blood clots, heart attacks, and other heart-related problems. °Should I take aspirin? °Your health care provider will help you determine whether it is safe and beneficial for you to take aspirin daily. Taking aspirin daily may be beneficial if you: °· Have had a heart attack or chest pain. °· Have undergone open heart surgery such as coronary artery bypass surgery (CABG). °· Have had coronary angioplasty. °· Have experienced a stroke or transient ischemic attack (TIA). °· Have peripheral vascular disease (PVD). °· Have chronic heart rhythm problems such as atrial fibrillation. ° °Are there any risks of taking aspirin daily? °Daily use of aspirin can   increase your risk of side effects. Some of these include: °· Bleeding. Bleeding problems can be minor or serious. An example of a minor problem is a cut that does not stop bleeding. An example of a more serious problem is stomach bleeding or bleeding into the brain. Your risk of bleeding is increased if you are also taking non-steroidal anti-inflammatory medicine (NSAIDs). °· Increased bruising. °· Upset stomach. °· An allergic reaction. People who have nasal polyps have an increased  risk of developing an aspirin allergy. ° °What are some guidelines I should follow when taking aspirin? °· Take aspirin only as directed by your health care provider. Make sure you understand how much you should take and what form you should take. The two forms of aspirin are: °? Non-enteric-coated. This type of aspirin does not have a coating and is absorbed quickly. Non-enteric-coated aspirin is usually recommended for people with chest pain. This type of aspirin also comes in a chewable form. °? Enteric-coated. This type of aspirin has a special coating that releases the medicine very slowly. Enteric-coated aspirin causes less stomach upset than non-enteric-coated aspirin. This type of aspirin should not be chewed or crushed. °· Drink alcohol in moderation. Drinking alcohol increases your risk of bleeding. °When should I seek medical care? °· You have unusual bleeding or bruising. °· You have stomach pain. °· You have an allergic reaction. Symptoms of an allergic reaction include: °? Hives. °? Itchy skin. °? Swelling of the lips, tongue, or face. °· You have ringing in your ears. °When should I seek immediate medical care? °· Your bowel movements are bloody, dark red, or black in color. °· You vomit or cough up blood. °· You have blood in your urine. °· You cough, wheeze, or feel short of breath. °If you have any of the following symptoms, this is an emergency. Do not wait to see if the pain will go away. Get medical help at once. Call your local emergency services (911 in the [place] Do not drive yourself to the hospital. °· You have severe chest pain, especially if the pain is crushing or pressure-like and spreads to the arms, back, neck, or jaw. °· You have stroke-like symptoms, such as: °? Loss of vision. °? Difficulty talking. °? Numbness or weakness on one side of your body. °? Numbness or weakness in your arm or leg. °? Not thinking clearly or feeling confused. ° °This information is not intended to replace  advice given to you by your health care provider. Make sure you discuss any questions you have with your health care provider. °Document Released: 02/17/2008 Document Revised: 07/14/2015 Document Reviewed: 06/11/2013 °Elsevier Interactive Patient Education © 2018 [employer] ° °

## 2018-01-09 NOTE — Progress Notes (Signed)
Progress Note  Patient Name: Trevor [name] Date of Encounter: 01/09/2018  Primary Cardiologist: [name], MD    Subjective   Remarkably no complaints No angina or signs of CHF   Inpatient Medications    Scheduled Meds: . aspirin EC  81 mg Oral Daily  . Influenza vac split quadrivalent PF  0.5 mL Intramuscular Tomorrow-1000  . metoprolol tartrate  6.25 mg Oral Q6H  . predniSONE  150 mg Oral Q breakfast   Followed by  . [START ON 01/16/2018] predniSONE  100 mg Oral Q breakfast   Followed by  . [START ON 01/23/2018] predniSONE  50 mg Oral Q breakfast  . sodium chloride flush  3 mL Intravenous Q12H   Continuous Infusions: . sodium chloride 100 mL/hr at 01/08/18 1150  . sodium chloride    . dextrose 5 % and 0.45% NaCl 100 mL/hr at 01/08/18 1348  . heparin 1,050 Units/hr (01/08/18 2318)  . insulin 0.6 Units/hr (01/08/18 1324)   PRN Meds: sodium chloride, acetaminophen, ondansetron (ZOFRAN) IV, sodium chloride flush   Vital Signs    Vitals:   01/09/18 0106 01/09/18 0500 01/09/18 0654 01/09/18 0734  BP: (!) 89/63  99/78 95/73  Pulse: 96  96 93  Resp:   18 20  Temp:   98.2 F (36.8 C) 97.8 F (36.6 C)  TempSrc:   Oral Oral  SpO2:   96%   Weight:  81.4 kg      Intake/Output Summary (Last 24 hours) at 01/09/2018 0823 Last data filed at 01/09/2018 0500 Gross per 24 hour  Intake 1143.16 ml  Output 700 ml  Net 443.16 ml   Filed Weights   01/09/18 0500  Weight: 81.4 kg    Telemetry    NSR 01/09/2018  - Personally Reviewed  ECG    SR RBBB ? Anterolateral MI - Personally Reviewed  Physical Exam  Chronically ill white male  Right femoral cath sight A  GEN: No acute distress.   Neck: No JVD Cardiac: RRR, no murmurs, rubs, or gallops.  Respiratory: Clear to auscultation bilaterally. GI: Soft, nontender, non-distended post nephrectomy  MS: No edema; No deformity. Neuro:  Nonfocal  Psych: Normal affect   Labs    Chemistry Recent Labs  Lab  01/02/18 1116  01/08/18 0600 01/08/18 0915 01/08/18 1701  NA 138   < > 135 135 138  K 4.6   < > 5.8* 5.1 5.1  CL 103   < > 106 110 112*  CO2 23   < > 17* 15* 15*  GLUCOSE 361*   < > 318* 188* 223*  BUN 38*   < > 25* 26* 26*  CREATININE 1.62*   < > 1.73* 1.68* 1.69*  CALCIUM 8.5*   < > 8.4* 8.1* 8.2*  PROT 6.1*  --  5.6*  --   --   ALBUMIN 2.8*  --  2.7*  --   --   AST 86*  --  120*  --   --   ALT 207*  --  150*  --   --   ALKPHOS 549*  --  346*  --   --   BILITOT 2.1*  --  1.7*  --   --   GFRNONAA 41*   < > 38* 39* 39*  GFRAA 48*   < > 44* 46* 45*  ANIONGAP 12   < > 12 10 11    < > = values in this interval not displayed.     Hematology  Recent Labs  Lab 01/02/18 1116 01/07/18 2112 01/09/18 0225  WBC 16.3* 10.8* 17.7*  RBC 3.15* 3.15* 3.53*  HGB 8.5* 8.2* 9.5*  HCT 27.3* 28.4* 31.6*  MCV 86.7 90.2 89.5  MCH 27.0 26.0 26.9  MCHC 31.1 28.9* 30.1  RDW 17.7* 18.9* 18.9*  PLT 310 320 212    Cardiac Enzymes Recent Labs  Lab 01/08/18 0600 01/08/18 0915 01/08/18 1701  TROPONINI 41.84* 45.83* 20.32*    Recent Labs  Lab 01/07/18 2121  TROPIPOC 1.06*     BNPNo results for input(s): BNP, PROBNP in the last 168 hours.   DDimer No results for input(s): DDIMER in the last 168 hours.   Radiology    Dg Chest Portable 1 View  Result Date: 01/07/2018 CLINICAL DATA:  Chest pain hyperglycemia EXAM: PORTABLE CHEST 1 VIEW COMPARISON:  CT 10/31/2016, radiograph 10/03/2017 FINDINGS: Post sternotomy changes. Cardiomegaly. No focal opacity or pleural effusion. No pneumothorax. IMPRESSION: No active disease.  Stable mild cardiomegaly. Electronically Signed   By: Donavan Foil M.D.   On: 01/07/2018 21:38    Cardiac Studies   TTE:  EF 20-25%  Cath:    1. Critical 3 vessel obstructive CAD- vessels are heavily calcified.     - 95% distal left main     - 100% proximal LAD    - 99% ostial ramus intermediate     - 95% ostial LCx    - 90% OM1    - 85% segmental proximal RCA     - 90% PDA    - 90% PLOM 2. Patent LIMA to the LAD 3. Known occlusion of SVGs 4. Moderately elevated LVEDP  Plan: compared to prior study in August 2018 there is progression of disease involving the ostium of the ramus intermediate. Otherwise no change. There are no good options for PCI and patient is not a candidate for CABG due to comorbid conditions. Would recommend correction of metabolic derangements (DKA) and correct anemia. I don't think he is a candidate for any coronary intervention and would recommend medical management and palliative care.   Patient Profile     72 yo male w/ known CABG (2014), NSTEMI w/ symp anemia ([facility] 2018, no revasc at that time), HFrEF (45%) , PAD and stage IV RCC who presents with chest pain, found to have NSTEMI  Assessment & Plan    CAD/CABG:  Cath 11/14/16 and yesterday show non revascularizeable  3 vessel disease with patient LIMA Pain free this am troponin although large bump to 45 is coming down will optimize medical Rx Given co morbidites and metastatic renal cancer would not pursue any further catheterizations  DCM:  Ischemic Not volume overloaded no CHF continue beta blocker add nitrates. Not on Entresto or ACE/ARB due to renal issues. Check BMET post contrast load   Ok to d/c home in am if ambulatory  [facility] will sign off.   Medication Recommendations:  Imdur 30 mg and Plavix 75 mg added  Other recommendations (labs, testing, etc):    Follow up as an outpatient:  [facility]   For questions or updates, please contact [place] Please consult [email or web address] for contact info under        Signed, Jenkins Rouge, MD  01/09/2018, 8:23 AM

## 2018-01-13 ENCOUNTER — Inpatient Hospital Stay (HOSPITAL_COMMUNITY)
Admission: EM | Admit: 2018-01-13 | Discharge: 2018-01-15 | DRG: 291 | Disposition: A | Discharge status: Home / Self Care | Payer: Medicare Other | Attending: Family Medicine | Admitting: Family Medicine

## 2018-01-13 ENCOUNTER — Encounter (HOSPITAL_COMMUNITY): Payer: Self-pay

## 2018-01-13 ENCOUNTER — Other Ambulatory Visit: Payer: Self-pay

## 2018-01-13 ENCOUNTER — Emergency Department (HOSPITAL_COMMUNITY): Payer: Medicare Other

## 2018-01-13 DIAGNOSIS — Z8673 Personal history of transient ischemic attack (TIA), and cerebral infarction without residual deficits: Secondary | ICD-10-CM

## 2018-01-13 DIAGNOSIS — Z9221 Personal history of antineoplastic chemotherapy: Secondary | ICD-10-CM

## 2018-01-13 DIAGNOSIS — I130 Hypertensive heart and chronic kidney disease with heart failure and stage 1 through stage 4 chronic kidney disease, or unspecified chronic kidney disease: Principal | ICD-10-CM | POA: Diagnosis present

## 2018-01-13 DIAGNOSIS — Z806 Family history of leukemia: Secondary | ICD-10-CM

## 2018-01-13 DIAGNOSIS — I509 Heart failure, unspecified: Secondary | ICD-10-CM

## 2018-01-13 DIAGNOSIS — I5043 Acute on chronic combined systolic (congestive) and diastolic (congestive) heart failure: Secondary | ICD-10-CM | POA: Diagnosis not present

## 2018-01-13 DIAGNOSIS — I25810 Atherosclerosis of coronary artery bypass graft(s) without angina pectoris: Secondary | ICD-10-CM

## 2018-01-13 DIAGNOSIS — Z951 Presence of aortocoronary bypass graft: Secondary | ICD-10-CM

## 2018-01-13 DIAGNOSIS — E785 Hyperlipidemia, unspecified: Secondary | ICD-10-CM | POA: Diagnosis present

## 2018-01-13 DIAGNOSIS — M1990 Unspecified osteoarthritis, unspecified site: Secondary | ICD-10-CM | POA: Diagnosis present

## 2018-01-13 DIAGNOSIS — Z7984 Long term (current) use of oral hypoglycemic drugs: Secondary | ICD-10-CM

## 2018-01-13 DIAGNOSIS — Z87891 Personal history of nicotine dependence: Secondary | ICD-10-CM

## 2018-01-13 DIAGNOSIS — E1122 Type 2 diabetes mellitus with diabetic chronic kidney disease: Secondary | ICD-10-CM | POA: Diagnosis present

## 2018-01-13 DIAGNOSIS — E119 Type 2 diabetes mellitus without complications: Secondary | ICD-10-CM

## 2018-01-13 DIAGNOSIS — Z7902 Long term (current) use of antithrombotics/antiplatelets: Secondary | ICD-10-CM

## 2018-01-13 DIAGNOSIS — R748 Abnormal levels of other serum enzymes: Secondary | ICD-10-CM | POA: Diagnosis present

## 2018-01-13 DIAGNOSIS — D631 Anemia in chronic kidney disease: Secondary | ICD-10-CM | POA: Diagnosis present

## 2018-01-13 DIAGNOSIS — Z7982 Long term (current) use of aspirin: Secondary | ICD-10-CM

## 2018-01-13 DIAGNOSIS — I10 Essential (primary) hypertension: Secondary | ICD-10-CM | POA: Diagnosis present

## 2018-01-13 DIAGNOSIS — N183 Chronic kidney disease, stage 3 unspecified: Secondary | ICD-10-CM | POA: Diagnosis present

## 2018-01-13 DIAGNOSIS — Z823 Family history of stroke: Secondary | ICD-10-CM

## 2018-01-13 DIAGNOSIS — R0602 Shortness of breath: Secondary | ICD-10-CM | POA: Diagnosis not present

## 2018-01-13 DIAGNOSIS — E1151 Type 2 diabetes mellitus with diabetic peripheral angiopathy without gangrene: Secondary | ICD-10-CM | POA: Diagnosis present

## 2018-01-13 DIAGNOSIS — Z905 Acquired absence of kidney: Secondary | ICD-10-CM

## 2018-01-13 DIAGNOSIS — N179 Acute kidney failure, unspecified: Secondary | ICD-10-CM | POA: Diagnosis present

## 2018-01-13 DIAGNOSIS — I2510 Atherosclerotic heart disease of native coronary artery without angina pectoris: Secondary | ICD-10-CM | POA: Diagnosis present

## 2018-01-13 DIAGNOSIS — Z8249 Family history of ischemic heart disease and other diseases of the circulatory system: Secondary | ICD-10-CM

## 2018-01-13 DIAGNOSIS — Z66 Do not resuscitate: Secondary | ICD-10-CM | POA: Diagnosis present

## 2018-01-13 DIAGNOSIS — I252 Old myocardial infarction: Secondary | ICD-10-CM

## 2018-01-13 DIAGNOSIS — Z85528 Personal history of other malignant neoplasm of kidney: Secondary | ICD-10-CM

## 2018-01-13 DIAGNOSIS — R778 Other specified abnormalities of plasma proteins: Secondary | ICD-10-CM

## 2018-01-13 DIAGNOSIS — R7989 Other specified abnormal findings of blood chemistry: Secondary | ICD-10-CM

## 2018-01-13 DIAGNOSIS — I255 Ischemic cardiomyopathy: Secondary | ICD-10-CM | POA: Diagnosis present

## 2018-01-13 LAB — CBC WITH DIFFERENTIAL/PLATELET
ABS IMMATURE GRANULOCYTES: 0.08 10*3/uL — AB (ref 0.00–0.07)
BASOS ABS: 0 10*3/uL (ref 0.0–0.1)
Basophils Relative: 0 %
EOS PCT: 2 %
Eosinophils Absolute: 0.2 10*3/uL (ref 0.0–0.5)
HCT: 29.6 % — ABNORMAL LOW (ref 39.0–52.0)
HEMOGLOBIN: 9.1 g/dL — AB (ref 13.0–17.0)
IMMATURE GRANULOCYTES: 1 %
LYMPHS ABS: 0.8 10*3/uL (ref 0.7–4.0)
LYMPHS PCT: 9 %
MCH: 27.4 pg (ref 26.0–34.0)
MCHC: 30.7 g/dL (ref 30.0–36.0)
MCV: 89.2 fL (ref 80.0–100.0)
Monocytes Absolute: 0.7 10*3/uL (ref 0.1–1.0)
Monocytes Relative: 7 %
NEUTROS ABS: 8 10*3/uL — AB (ref 1.7–7.7)
NEUTROS PCT: 81 %
Platelets: 154 10*3/uL (ref 150–400)
RBC: 3.32 MIL/uL — ABNORMAL LOW (ref 4.22–5.81)
RDW: 19.8 % — ABNORMAL HIGH (ref 11.5–15.5)
WBC: 9.9 10*3/uL (ref 4.0–10.5)
nRBC: 0 % (ref 0.0–0.2)

## 2018-01-13 LAB — I-STAT TROPONIN, ED
TROPONIN I, POC: 5.38 ng/mL — AB (ref 0.00–0.08)
Troponin i, poc: 4.88 ng/mL (ref 0.00–0.08)

## 2018-01-13 LAB — COMPREHENSIVE METABOLIC PANEL
ALT: 98 U/L — AB (ref 0–44)
AST: 59 U/L — ABNORMAL HIGH (ref 15–41)
Albumin: 2.7 g/dL — ABNORMAL LOW (ref 3.5–5.0)
Alkaline Phosphatase: 305 U/L — ABNORMAL HIGH (ref 38–126)
Anion gap: 8 (ref 5–15)
BUN: 15 mg/dL (ref 8–23)
CHLORIDE: 106 mmol/L (ref 98–111)
CO2: 23 mmol/L (ref 22–32)
Calcium: 8.5 mg/dL — ABNORMAL LOW (ref 8.9–10.3)
Creatinine, Ser: 1.59 mg/dL — ABNORMAL HIGH (ref 0.61–1.24)
GFR calc Af Amer: 49 mL/min — ABNORMAL LOW (ref >60)
GFR calc non Af Amer: 42 mL/min — ABNORMAL LOW (ref >60)
Glucose, Bld: 187 mg/dL — ABNORMAL HIGH (ref 70–99)
POTASSIUM: 4.8 mmol/L (ref 3.5–5.1)
Sodium: 137 mmol/L (ref 135–145)
Total Bilirubin: 1.5 mg/dL — ABNORMAL HIGH (ref 0.3–1.2)
Total Protein: 5.5 g/dL — ABNORMAL LOW (ref 6.5–8.1)

## 2018-01-13 LAB — BRAIN NATRIURETIC PEPTIDE: B NATRIURETIC PEPTIDE 5: 3924.3 pg/mL — AB (ref 0.0–100.0)

## 2018-01-13 MED ORDER — FUROSEMIDE 10 MG/ML IJ SOLN
40.0000 mg | Freq: Once | INTRAMUSCULAR | Status: AC
  Administered 2018-01-13: 40 mg via INTRAVENOUS
  Filled 2018-01-13: qty 4

## 2018-01-13 NOTE — ED Triage Notes (Signed)
Per [facility], pt from home with a c/o of shortness of breath with exertion and lying flat since last night. Lung sounds clear. Pt tx at [facility] last week for heart attack. No stents placed. BP 124/76, HR 113, 96-98% on RA. 18 ga R AC.

## 2018-01-13 NOTE — ED Provider Notes (Signed)
[facility] EMERGENCY DEPARTMENT Provider Note   CSN: [id number] Arrival date & time: 01/13/18  1609     History   Chief Complaint Chief Complaint  Patient presents with  . Shortness of Breath    HPI Trevor [name] is a 72 y.o. male.  The history is provided by the patient.  Shortness of Breath  This is a new problem. The problem occurs continuously.The current episode started more than 2 days ago. The problem has been gradually worsening. Associated symptoms include orthopnea and leg swelling. Pertinent negatives include no fever, no headaches, no coryza, no rhinorrhea, no sore throat, no ear pain, no neck pain, no cough, no sputum production, no hemoptysis, no wheezing, no chest pain, no syncope, no vomiting, no abdominal pain and no rash. Precipitated by: Recent MI. He has tried nothing for the symptoms. The treatment provided no relief. He has had prior hospitalizations. He has had prior ED visits. Associated medical issues include CAD and heart failure.    Past Medical History:  Diagnosis Date  . 3-vessel coronary artery disease   . Anemia    2 iron infusions  . Arthritis   . Carotid disease, bilateral (HCC)    moderate  . Chronic systolic heart failure (HCC)    EF 45%  . Coronary artery disease    06/2012:STEMI s/p CABG; 8/18 NSTEMI  . Diabetes mellitus without complication ([place])   . Diarrhea   . Dyspnea    mild  . History of blood transfusion   . Hyperlipidemia   . Ischemic cardiomyopathy    Ejection fraction of 35-40% initially, 45 - 50% on echo 2014, 53% by perfusion study 2016  . Mild mitral regurgitation   . Numbness    Right side since stroke  . OA (osteoarthritis)   . PAD (peripheral artery disease) ([place])   . Pulmonary nodule   . Renal cell cancer, left ([place]) 12/2016   Associated w/ pulmonary nodules  . Stroke ([place]) 2018  . TIA (transient ischemic attack) 10/2016  . Weakness generalized   . Wears glasses     Patient Active  Problem List   Diagnosis Date Noted  . CHF (congestive heart failure) ([place]) 01/13/2018  . CKD (chronic kidney disease), stage III ([place]) 01/08/2018  . Hyperkalemia 01/08/2018  . DKA (diabetic ketoacidoses) ([place]) 01/08/2018  . Lactic acidosis   . Drug-induced hepatitis 01/02/2018  . Hypertension 11/01/2017  . Encounter for antineoplastic immunotherapy 09/06/2017  . Goals of care, counseling/discussion 07/04/2017  . Malnutrition of moderate degree 04/19/2017  . Colitis 04/19/2017  . Acute kidney injury ([place]) 04/17/2017  . History of CVA (cerebrovascular accident) 04/17/2017  . Severe diarrhea 04/17/2017  . 3-vessel coronary artery disease 04/17/2017  . Chronic combined systolic and diastolic heart failure ([place]) 04/17/2017  . Diabetes mellitus type 2 in nonobese ([place]) 04/17/2017  . Chronic systolic heart failure ([place])   . Dehydration 04/11/2017  . Nausea without vomiting 04/11/2017  . Elevated LFTs 04/11/2017  . Encounter for antineoplastic chemotherapy 02/05/2017  . Renal cell cancer ([place]) 01/18/2017  . Renal cell carcinoma ([place]) 01/15/2017  . Renal cell cancer, left ([place]) 12/18/2016  . Renal mass 12/06/2016  . 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 NSTEMI (non-ST elevated myocardial infarction) ([place])   . Anemia 11/11/2016  . TIA (transient ischemic attack) 10/18/2016  . Iron deficiency anemia  due to chronic blood loss 09/20/2015  . Recent weight loss 09/20/2015  . Bilateral leg pain 02/21/2015  . Coronary artery disease   . Ischemic cardiomyopathy   . Hyperlipidemia   . ST elevation myocardial infarction (STEMI) of anterior wall, initial episode of care ([place]) 07/04/2012  . Hyperglycemia 07/04/2012    Past Surgical History:  Procedure Laterality Date  . CARDIOVASCULAR STRESS TEST    . CORONARY ARTERY  BYPASS GRAFT N/A 07/05/2012   Procedure: CORONARY ARTERY BYPASS GRAFTING (CABG);  Surgeon: Ivin Poot, MD;  Location: [facility];  Service: Open Heart Surgery;  Laterality: N/A;  . INTRAOPERATIVE TRANSESOPHAGEAL ECHOCARDIOGRAM N/A 07/05/2012   Procedure: INTRAOPERATIVE TRANSESOPHAGEAL ECHOCARDIOGRAM;  Surgeon: Ivin Poot, MD;  Location: [facility];  Service: Open Heart Surgery;  Laterality: N/A;  . LEFT HEART CATH AND CORS/GRAFTS ANGIOGRAPHY N/A 11/14/2016   Procedure: LEFT HEART CATH AND CORS/GRAFTS ANGIOGRAPHY;  Surgeon: Belva Crome, MD;  Location: [facility];  Service: Cardiovascular;  Laterality: N/A;  . LEFT HEART CATH AND CORS/GRAFTS ANGIOGRAPHY N/A 01/08/2018   Procedure: LEFT HEART CATH AND CORS/GRAFTS ANGIOGRAPHY;  Surgeon: Martinique, Peter M, MD;  Location: [facility];  Service: Cardiovascular;  Laterality: N/A;  . LEFT HEART CATHETERIZATION WITH CORONARY ANGIOGRAM N/A 07/04/2012   Procedure: LEFT HEART CATHETERIZATION WITH CORONARY ANGIOGRAM;  Surgeon: Wellington Hampshire, MD;  Location: [facility];  Service: Cardiovascular;  Laterality: N/A;  . RENAL BIOPSY    . ROBOT ASSISTED LAPAROSCOPIC NEPHRECTOMY Left 02/21/2017   Procedure: XI ROBOTIC ASSISTED LAPAROSCOPIC NEPHRECTOMY;  Surgeon: Alexis Frock, MD;  Location: [facility];  Service: Urology;  Laterality: Left;        Home Medications    Prior to Admission medications   Medication Sig Start Date End Date Taking? Authorizing Provider  aspirin EC 81 MG tablet Take 81 mg by mouth daily.   Yes [provider]  atorvastatin (LIPITOR) 40 MG tablet Take 1 tablet (40 mg total) by mouth daily. 05/20/13  Yes Wellington Hampshire, MD  carvedilol (COREG) 6.25 MG tablet Take 1 tablet (6.25 mg total) by mouth 2 (two) times daily. 08/28/17  Yes Wellington Hampshire, MD  clopidogrel (PLAVIX) 75 MG tablet Take 1 tablet (75 mg total) by mouth daily. 01/10/18 02/09/18 Yes Donne Hazel, MD  ferrous sulfate 325 (65 FE) MG tablet Take 325  mg by mouth daily with breakfast.   Yes [provider]  isosorbide mononitrate (IMDUR) 30 MG 24 hr tablet Take 1 tablet (30 mg total) by mouth daily. 01/10/18 02/09/18 Yes Donne Hazel, MD  ondansetron (ZOFRAN ODT) 4 MG disintegrating tablet Take 1 tablet (4 mg total) by mouth every 8 (eight) hours as needed for nausea or vomiting. 04/20/17  Yes Rai, Ripudeep K, MD  sitaGLIPtin (JANUVIA) 50 MG tablet Take 1 tablet (50 mg total) by mouth daily. 01/09/18 02/08/18 Yes Donne Hazel, MD  predniSONE (DELTASONE) 50 MG tablet 4 tablet p.o. daily x1 week, followed by 3 tablets p.o. daily x1 week, followed by 2 tablets p.o. daily x1 week, then 1 tablet p.o. daily x1 week. Patient not taking: Reported on 01/13/2018 12/27/17   Curt Bears, MD    Family History Family History  Problem Relation Age of Onset  . Cancer Mother        leukemia  . Stroke Mother   . Cancer Maternal Grandfather        possible cancer  . Heart attack Brother 72  Social History Social History   Tobacco Use  . Smoking status: Former Smoker    Packs/day: 1.00    Years: 50.00    Pack years: 50.00    Last attempt to quit: 04/20/2012    Years since quitting: 5.7  . Smokeless tobacco: Never Used  Substance Use Topics  . Alcohol use: No  . Drug use: No     Allergies   Patient has no known allergies.   Review of Systems Review of Systems  Constitutional: Negative for chills and fever.  HENT: Negative for ear pain, rhinorrhea and sore throat.   Eyes: Negative for pain and visual disturbance.  Respiratory: Positive for shortness of breath. Negative for cough, hemoptysis, sputum production and wheezing.   Cardiovascular: Positive for orthopnea and leg swelling. Negative for chest pain, palpitations and syncope.  Gastrointestinal: Negative for abdominal pain and vomiting.  Genitourinary: Negative for dysuria and hematuria.  Musculoskeletal: Negative for arthralgias, back pain and neck pain.  Skin:  Negative for color change and rash.  Neurological: Negative for seizures, syncope and headaches.  All other systems reviewed and are negative.    Physical Exam Updated Vital Signs  ED Triage Vitals  Enc Vitals Group     BP --      Pulse --      Resp --      Temp --      Temp src --      SpO2 01/13/18 1612 96 %     Weight 01/13/18 1624 172 lb (78 kg)     Height 01/13/18 1624 5\' 11"  (1.803 m)     Head Circumference --      Peak Flow --      Pain Score 01/13/18 1623 1     Pain Loc --      Pain Edu? --      Excl. in [place]? --     Physical Exam  Constitutional: He is oriented to person, place, and time. He appears well-developed and well-nourished.  HENT:  Head: Normocephalic and atraumatic.  Eyes: Pupils are equal, round, and reactive to light. Conjunctivae and EOM are normal.  Neck: Normal range of motion. Neck supple.  Cardiovascular: Normal rate and regular rhythm.  No murmur heard. Pulmonary/Chest: Effort normal. Tachypnea noted. No respiratory distress. He has decreased breath sounds. He has no wheezes. He has rales.  Abdominal: Soft. There is no tenderness.  Musculoskeletal: Normal range of motion.       Left lower leg: He exhibits edema.  Neurological: He is alert and oriented to person, place, and time.  Skin: Skin is warm and dry. Capillary refill takes less than 2 seconds.  Psychiatric: He has a normal mood and affect.  Nursing note and vitals reviewed.    ED Treatments / Results  Labs (all labs ordered are listed, but only abnormal results are displayed) Labs Reviewed  COMPREHENSIVE METABOLIC PANEL - Abnormal; Notable for the following components:      Result Value   Glucose, Bld 187 (*)    Creatinine, Ser 1.59 (*)    Calcium 8.5 (*)    Total Protein 5.5 (*)    Albumin 2.7 (*)    AST 59 (*)    ALT 98 (*)    Alkaline Phosphatase 305 (*)    Total Bilirubin 1.5 (*)    GFR calc non Af Amer 42 (*)    GFR calc Af Amer 49 (*)    All other components within  normal limits  CBC WITH DIFFERENTIAL/PLATELET - Abnormal; Notable for the following components:   RBC 3.32 (*)    Hemoglobin 9.1 (*)    HCT 29.6 (*)    RDW 19.8 (*)    Neutro Abs 8.0 (*)    Abs Immature Granulocytes 0.08 (*)    All other components within normal limits  BRAIN NATRIURETIC PEPTIDE - Abnormal; Notable for the following components:   B Natriuretic Peptide 3,924.3 (*)    All other components within normal limits  I-STAT TROPONIN, ED - Abnormal; Notable for the following components:   Troponin i, poc 4.88 (*)    All other components within normal limits  I-STAT TROPONIN, ED    EKG EKG Interpretation  Date/Time:  Sunday January 13 2018 16:23:24 EDT Ventricular Rate:  109 PR Interval:    QRS Duration: 81 QT Interval:  350 QTC Calculation: 459 R Axis:   -2 Text Interpretation:  Sinus tachycardia Multiple ventricular premature complexes Repol abnrm, severe global ischemia (LM/MVD) No significant change since last tracing Confirmed by Lennice Sites [phone]) on 01/13/2018 4:26:21 PM   Radiology Dg Chest Portable 1 View  Result Date: 01/13/2018 CLINICAL DATA:  Shortness of breath. EXAM: PORTABLE CHEST 1 VIEW COMPARISON:  January 07, 2018 FINDINGS: Stable cardiomegaly. Mild pulmonary venous congestion. Probable small left effusion with atelectasis. IMPRESSION: Cardiomegaly and pulmonary venous congestion. Probable small left effusion with atelectasis. Electronically Signed   By: Dorise Bullion III M.D   On: 01/13/2018 16:59    Procedures Procedures (including critical care time)  Medications Ordered in ED Medications  furosemide (LASIX) injection 40 mg (40 mg Intravenous Given 01/13/18 1846)     Initial Impression / Assessment and Plan / ED Course  I have reviewed the triage vital signs and the nursing notes.  Pertinent labs & imaging results that were available during my care of the patient were reviewed by me and considered in my medical decision making (see  chart for details).     Trevor [name] is a 72 year old male with history of CAD status post CABG, CKD, renal cancer status post resection, anemia who presents to the ED with shortness of breath.  Patient with unremarkable vitals.  No fever.  Breathing well on room air.  Patient recent cardiac event last week and was treated medically with heparin.  Had extensive cardiac work-up including heart catheterization.  Patient at this point per cardiology note needs to pursue palliative recommendations as he is maximally treated and no longer a surgical candidate.  He denies any chest pain.  He has increasing orthopnea and shortness of breath with exertion.  He has rales on exam, peripheral edema consistent with likely volume overload.  Patient is not on a diuretic.  Patient denies any infectious symptoms.  EKG shows global ischemic changes that are chronic.  Has ST depressions throughout which is unchanged from prior EKGs.  Patient with troponin of 5 but is downtrending from his recent hospital stay.  BNP is elevated and chest x-ray show signs of volume overload.  Contacted cardiology and they recommend admission to hospitalist for further care.  No need to start heparin at this time and should trend troponins before initiating any heparin.  Patient needs palliative care consultation.  Patient was given IV Lasix as believe he would benefit from diuretic.  Has known decreased ejection fraction.  Will closely monitor electrolytes and creatinine.  Patient admitted to hospitalist service in stable condition for further care.  This chart was dictated using voice recognition software.  Despite  best efforts to proofread,  errors can occur which can change the documentation meaning.   Final Clinical Impressions(s) / ED Diagnoses   Final diagnoses:  Acute congestive heart failure, unspecified heart failure type [facility])  Elevated troponin    ED Discharge Orders    None       Lennice Sites, DO 01/13/18 1937

## 2018-01-13 NOTE — H&P (Signed)
History and Physical    Trevor [name] LPF:[id number] DOB: 02-16-1946 DOA: 01/13/2018  I have briefly reviewed the patient's prior medical records in [place]  PCP: Via, [name], MD  Patient coming from: home   Chief Complaint: Shortness of breath  HPI: Trevor [name] is a 72 y.o. male with medical history significant of advanced coronary artery disease (and history of CABG) with most recent cardiac cath on 01/08/2018 which showed critical three-vessel obstructive coronary artery disease for which medical management and palliative approach was recommended, diabetes mellitus, hypertension, hyperlipidemia, who presents to the hospital with chief complaint of shortness of breath.  He was recently admitted for NSTEMI and discharged about 3 days ago.  He states that he felt well when he got home on Thursday evening, however when he woke up on Friday he noticed that he is becoming more short of breath.  Since then, he has been unable to ambulate much before needing to stop and catch his breath, and also unable to lay flat.  He is also been complaining of chest discomfort on and off especially when he gets short of breath.  Over the last 2 days he also has noticed that his legs have become more swollen around his ankles.  He does not recall having the swelling when he left home but he is not sure.  He denies any fever or chills, denies any abdominal pain, nausea vomiting or diarrhea.  ED Course: In the emergency room he is afebrile, vital signs are stable, he is satting well on room air.  Blood work shows a creatinine of 1.59 (around 1.7-1.8 when he was hospitalized a few days ago), CBC shows mild anemia, BNP was elevated to 4000 and his initial troponin was 4.88.  Chest x-ray showed evidence of fluid overload, he was given IV Lasix and we are asked to admit.  EDP discussed case with cardiology, not a candidate for further interventions at this point  2D echo 10/22 Study Conclusions - Left ventricle: The  cavity size was mildly dilated. Wall thickness was normal. Systolic function was severely reduced. The estimated ejection fraction was in the range of 20% to 25%. Severe diffuse hypokinesis with no identifiable regional variations. Dyskinesis and scarring of the basal-mid anteroseptal   myocardium and relatively preserved apical contraction; consistent with infarction in the distribution of the proximal left anterior descending coronary artery with patent bypass to the distal LAD. There was fusion of early and atrial contributions to ventricular filling. Features are consistent with a pseudonormal left ventricular filling pattern, with concomitant abnormal relaxation and increased filling pressure (grade 2 diastolic dysfunction). Acoustic contrast opacification revealed no evidence ofthrombus. - Ventricular septum: Septal motion showed paradox. - Mitral valve: There was mild to moderate regurgitation directed centrally. - Left atrium: The atrium was severely dilated. - Right ventricle: Systolic function was moderately reduced. - Right atrium: The atrium was mildly dilated. - Pulmonary arteries: PA peak pressure: 33 mm Hg (S).  Cardiac cath 01/08/2018 1. Critical 3 vessel obstructive CAD- vessels are heavily calcified.     - 95% distal left main     - 100% proximal LAD    - 99% ostial ramus intermediate     - 95% ostial LCx    - 90% OM1    - 85% segmental proximal RCA    - 90% PDA    - 90% PLOM 2. Patent LIMA to the LAD 3. Known occlusion of SVGs 4. Moderately elevated LVEDP  Plan: compared to prior study  in August 2018 there is progression of disease involving the ostium of the ramus intermediate. Otherwise no change. There are no good options for PCI and patient is not a candidate for CABG due to comorbid conditions. Would recommend correction of metabolic derangements (DKA) and correct anemia. I don't think he is a candidate for any coronary intervention and would recommend medical  management and palliative care.    Review of Systems: As per HPI otherwise 10 point review of systems negative.   Past Medical History:  Diagnosis Date  . 3-vessel coronary artery disease   . Anemia    2 iron infusions  . Arthritis   . Carotid disease, bilateral (HCC)    moderate  . Chronic systolic heart failure (HCC)    EF 45%  . Coronary artery disease    06/2012:STEMI s/p CABG; 8/18 NSTEMI  . Diabetes mellitus without complication ([place])   . Diarrhea   . Dyspnea    mild  . History of blood transfusion   . Hyperlipidemia   . Ischemic cardiomyopathy    Ejection fraction of 35-40% initially, 45 - 50% on echo 2014, 53% by perfusion study 2016  . Mild mitral regurgitation   . Numbness    Right side since stroke  . OA (osteoarthritis)   . PAD (peripheral artery disease) ([place])   . Pulmonary nodule   . Renal cell cancer, left ([place]) 12/2016   Associated w/ pulmonary nodules  . Stroke ([place]) 2018  . TIA (transient ischemic attack) 10/2016  . Weakness generalized   . Wears glasses     Past Surgical History:  Procedure Laterality Date  . CARDIOVASCULAR STRESS TEST    . CORONARY ARTERY BYPASS GRAFT N/A 07/05/2012   Procedure: CORONARY ARTERY BYPASS GRAFTING (CABG);  Surgeon: Ivin Poot, MD;  Location: [facility];  Service: Open Heart Surgery;  Laterality: N/A;  . INTRAOPERATIVE TRANSESOPHAGEAL ECHOCARDIOGRAM N/A 07/05/2012   Procedure: INTRAOPERATIVE TRANSESOPHAGEAL ECHOCARDIOGRAM;  Surgeon: Ivin Poot, MD;  Location: [facility];  Service: Open Heart Surgery;  Laterality: N/A;  . LEFT HEART CATH AND CORS/GRAFTS ANGIOGRAPHY N/A 11/14/2016   Procedure: LEFT HEART CATH AND CORS/GRAFTS ANGIOGRAPHY;  Surgeon: Belva Crome, MD;  Location: [facility];  Service: Cardiovascular;  Laterality: N/A;  . LEFT HEART CATH AND CORS/GRAFTS ANGIOGRAPHY N/A 01/08/2018   Procedure: LEFT HEART CATH AND CORS/GRAFTS ANGIOGRAPHY;  Surgeon: Martinique, Peter M, MD;  Location: [facility];   Service: Cardiovascular;  Laterality: N/A;  . LEFT HEART CATHETERIZATION WITH CORONARY ANGIOGRAM N/A 07/04/2012   Procedure: LEFT HEART CATHETERIZATION WITH CORONARY ANGIOGRAM;  Surgeon: Wellington Hampshire, MD;  Location: [facility];  Service: Cardiovascular;  Laterality: N/A;  . RENAL BIOPSY    . ROBOT ASSISTED LAPAROSCOPIC NEPHRECTOMY Left 02/21/2017   Procedure: XI ROBOTIC ASSISTED LAPAROSCOPIC NEPHRECTOMY;  Surgeon: Alexis Frock, MD;  Location: [facility];  Service: Urology;  Laterality: Left;     reports that he quit smoking about 5 years ago. He has a 50.00 pack-year smoking history. He has never used smokeless tobacco. He reports that he does not drink alcohol or use drugs.  No Known Allergies  Family History  Problem Relation Age of Onset  . Cancer Mother        leukemia  . Stroke Mother   . Cancer Maternal Grandfather        possible cancer  . Heart attack Brother 50    Prior to Admission medications   Medication Sig Start Date End  Date Taking? Authorizing Provider  aspirin EC 81 MG tablet Take 81 mg by mouth daily.   Yes [provider]  atorvastatin (LIPITOR) 40 MG tablet Take 1 tablet (40 mg total) by mouth daily. 05/20/13  Yes Wellington Hampshire, MD  carvedilol (COREG) 6.25 MG tablet Take 1 tablet (6.25 mg total) by mouth 2 (two) times daily. 08/28/17  Yes Wellington Hampshire, MD  clopidogrel (PLAVIX) 75 MG tablet Take 1 tablet (75 mg total) by mouth daily. 01/10/18 02/09/18 Yes Donne Hazel, MD  ferrous sulfate 325 (65 FE) MG tablet Take 325 mg by mouth daily with breakfast.   Yes [provider]  isosorbide mononitrate (IMDUR) 30 MG 24 hr tablet Take 1 tablet (30 mg total) by mouth daily. 01/10/18 02/09/18 Yes Donne Hazel, MD  ondansetron (ZOFRAN ODT) 4 MG disintegrating tablet Take 1 tablet (4 mg total) by mouth every 8 (eight) hours as needed for nausea or vomiting. 04/20/17  Yes Rai, Ripudeep K, MD  sitaGLIPtin (JANUVIA) 50 MG tablet Take 1 tablet (50 mg  total) by mouth daily. 01/09/18 02/08/18 Yes Donne Hazel, MD  predniSONE (DELTASONE) 50 MG tablet 4 tablet p.o. daily x1 week, followed by 3 tablets p.o. daily x1 week, followed by 2 tablets p.o. daily x1 week, then 1 tablet p.o. daily x1 week. Patient not taking: Reported on 01/13/2018 12/27/17   Curt Bears, MD    Physical Exam: Vitals:   01/13/18 1745 01/13/18 1800 01/13/18 1830 01/13/18 1845  BP: 105/82 104/71 99/67 99/67   Pulse: (!) 104 99 (!) 103 97  Resp: (!) 24 16 13 20   SpO2: 100% 98% 99% 99%  Weight:      Height:        Constitutional: NAD, calm, comfortable Eyes: PERRL, lids and conjunctivae normal ENMT: Mucous membranes are moist.  Neck: normal, suppley Respiratory: Bibasilar crackles, no wheezing. Normal respiratory effort. No accessory muscle use.  Cardiovascular: Regular rate and rhythm, no murmurs / rubs / gallops.  2+ pitting lower extremity edema. 2+ pedal pulses.  Abdomen: no tenderness, no masses palpated. Bowel sounds positive.  Musculoskeletal: no clubbing / cyanosis. Normal muscle tone.  Skin: no rashes, lesions, ulcers. No induration Neurologic: CN 2-12 grossly intact. Strength 5/5 in all 4.  Psychiatric: Normal judgment and insight. Alert and oriented x 3. Normal mood.   Labs on Admission: I have personally reviewed following labs and imaging studies  CBC: Recent Labs  Lab 01/07/18 2112 01/09/18 0225 01/13/18 1636  WBC 10.8* 17.7* 9.9  NEUTROABS  --   --  8.0*  HGB 8.2* 9.5* 9.1*  HCT 28.4* 31.6* 29.6*  MCV 90.2 89.5 89.2  PLT 320 212 119   Basic Metabolic Panel: Recent Labs  Lab 01/07/18 2112 01/08/18 0600 01/08/18 0915 01/08/18 1701 01/13/18 1636  NA 131* 135 135 138 137  K 5.6* 5.8* 5.1 5.1 4.8  CL 99 106 110 112* 106  CO2 16* 17* 15* 15* 23  GLUCOSE 479* 318* 188* 223* 187*  BUN 25* 25* 26* 26* 15  CREATININE 1.88* 1.73* 1.68* 1.69* 1.59*  CALCIUM 8.5* 8.4* 8.1* 8.2* 8.5*   GFR: Estimated Creatinine Clearance: 45.4  mL/min (A) (by C-G formula based on SCr of 1.59 mg/dL (H)). Liver Function Tests: Recent Labs  Lab 01/08/18 0600 01/13/18 1636  AST 120* 59*  ALT 150* 98*  ALKPHOS 346* 305*  BILITOT 1.7* 1.5*  PROT 5.6* 5.5*  ALBUMIN 2.7* 2.7*   No results for input(s): LIPASE, AMYLASE in  the last 168 hours. No results for input(s): AMMONIA in the last 168 hours. Coagulation Profile: No results for input(s): INR, PROTIME in the last 168 hours. Cardiac Enzymes: Recent Labs  Lab 01/08/18 0600 01/08/18 0915 01/08/18 1701  TROPONINI 41.84* 45.83* 20.32*   BNP (last 3 results) No results for input(s): PROBNP in the last 8760 hours. HbA1C: No results for input(s): HGBA1C in the last 72 hours. CBG: Recent Labs  Lab 01/08/18 1702 01/08/18 2101 01/09/18 0733 01/09/18 1136 01/09/18 1634  GLUCAP 188* 226* 163* 241* 281*   Lipid Profile: No results for input(s): CHOL, HDL, LDLCALC, TRIG, CHOLHDL, LDLDIRECT in the last 72 hours. Thyroid Function Tests: No results for input(s): TSH, T4TOTAL, FREET4, T3FREE, THYROIDAB in the last 72 hours. Anemia Panel: No results for input(s): VITAMINB12, FOLATE, FERRITIN, TIBC, IRON, RETICCTPCT in the last 72 hours. Urine analysis:    Component Value Date/Time   COLORURINE YELLOW 04/17/2017 [place] 04/17/2017 1525   LABSPEC 1.012 04/17/2017 1525   PHURINE 5.0 04/17/2017 1525   GLUCOSEU NEGATIVE 04/17/2017 1525   HGBUR NEGATIVE 04/17/2017 [place] 04/17/2017 [place] 04/17/2017 1525   PROTEINUR NEGATIVE 04/17/2017 1525   UROBILINOGEN 0.2 07/04/2012 1852   NITRITE NEGATIVE 04/17/2017 1525   LEUKOCYTESUR NEGATIVE 04/17/2017 1525     Radiological Exams on Admission: Dg Chest Portable 1 View  Result Date: 01/13/2018 CLINICAL DATA:  Shortness of breath. EXAM: PORTABLE CHEST 1 VIEW COMPARISON:  January 07, 2018 FINDINGS: Stable cardiomegaly. Mild pulmonary venous congestion. Probable small left  effusion with atelectasis. IMPRESSION: Cardiomegaly and pulmonary venous congestion. Probable small left effusion with atelectasis. Electronically Signed   By: Dorise Bullion III M.D   On: 01/13/2018 16:59    EKG: Independently reviewed.  Sinus tachycardia, isolated ST segment elevation in V1  Assessment/Plan Active Problems:   Coronary artery disease   Acute kidney injury (HCC)   3-vessel coronary artery disease   Diabetes mellitus type 2 in nonobese (HCC)   Hypertension   CKD (chronic kidney disease), stage III (HCC)   CHF (congestive heart failure) (HCC)   Acute on chronic combined CHF -Most recent 2D echo showed an EF of 20-25%, patient has evidence of fluid overload with bilateral lower extremity mid edema as well as crackles on exam -Received IV Lasix in the ED, will place on 40 mg twice daily, monitor strict I's and O's, daily weights  Advanced coronary artery disease /recent hospitalization for NSTEMI / history of CABG 2014 -Troponin was as high as 45 during prior hospital stay, was in the 20 range just 5 days ago, currently at 4.88.  He denies any active chest pain currently, it may be on the downtrending slope versus demand ischemia from fluid overload -Continue medical management at this point  Chronic kidney disease stage III -Patient with history of renal cell carcinoma of the left kidney status post nephrectomy on 02/21/2017, currently with solitary kidney.  Creatinine now appears at baseline, continue to closely monitor while getting Lasix  Type 2 diabetes mellitus -Hold home medication, place on sliding scale, glucose on admission 187  Hypertension -On Imdur, Coreg, continue   DVT prophylaxis: Heparin subcu Code Status: DNR Family Communication: Son present at bedside Disposition Plan: Admit to telemetry Consults called: None  Marzetta Board, MD [facility] Pager [phone]  If 7PM-7AM, please contact night-coverage [email or web address] Password  [email or web address]  01/13/2018, 7:07 PM

## 2018-01-13 NOTE — ED Notes (Signed)
I Stat Trop I results of 4.88 reported to Dr. Elpidio Anis

## 2018-01-14 ENCOUNTER — Encounter (HOSPITAL_COMMUNITY): Payer: Self-pay | Admitting: General Practice

## 2018-01-14 DIAGNOSIS — Z823 Family history of stroke: Secondary | ICD-10-CM | POA: Diagnosis not present

## 2018-01-14 DIAGNOSIS — R0602 Shortness of breath: Secondary | ICD-10-CM | POA: Diagnosis present

## 2018-01-14 DIAGNOSIS — E119 Type 2 diabetes mellitus without complications: Secondary | ICD-10-CM | POA: Diagnosis not present

## 2018-01-14 DIAGNOSIS — E1151 Type 2 diabetes mellitus with diabetic peripheral angiopathy without gangrene: Secondary | ICD-10-CM | POA: Diagnosis present

## 2018-01-14 DIAGNOSIS — Z8673 Personal history of transient ischemic attack (TIA), and cerebral infarction without residual deficits: Secondary | ICD-10-CM | POA: Diagnosis not present

## 2018-01-14 DIAGNOSIS — E785 Hyperlipidemia, unspecified: Secondary | ICD-10-CM | POA: Diagnosis present

## 2018-01-14 DIAGNOSIS — I2510 Atherosclerotic heart disease of native coronary artery without angina pectoris: Secondary | ICD-10-CM | POA: Diagnosis present

## 2018-01-14 DIAGNOSIS — Z87891 Personal history of nicotine dependence: Secondary | ICD-10-CM | POA: Diagnosis not present

## 2018-01-14 DIAGNOSIS — I10 Essential (primary) hypertension: Secondary | ICD-10-CM | POA: Diagnosis not present

## 2018-01-14 DIAGNOSIS — Z7984 Long term (current) use of oral hypoglycemic drugs: Secondary | ICD-10-CM | POA: Diagnosis not present

## 2018-01-14 DIAGNOSIS — Z7902 Long term (current) use of antithrombotics/antiplatelets: Secondary | ICD-10-CM | POA: Diagnosis not present

## 2018-01-14 DIAGNOSIS — I130 Hypertensive heart and chronic kidney disease with heart failure and stage 1 through stage 4 chronic kidney disease, or unspecified chronic kidney disease: Secondary | ICD-10-CM | POA: Diagnosis present

## 2018-01-14 DIAGNOSIS — I509 Heart failure, unspecified: Secondary | ICD-10-CM | POA: Diagnosis not present

## 2018-01-14 DIAGNOSIS — M1990 Unspecified osteoarthritis, unspecified site: Secondary | ICD-10-CM | POA: Diagnosis present

## 2018-01-14 DIAGNOSIS — Z9221 Personal history of antineoplastic chemotherapy: Secondary | ICD-10-CM | POA: Diagnosis not present

## 2018-01-14 DIAGNOSIS — I252 Old myocardial infarction: Secondary | ICD-10-CM | POA: Diagnosis not present

## 2018-01-14 DIAGNOSIS — E1122 Type 2 diabetes mellitus with diabetic chronic kidney disease: Secondary | ICD-10-CM | POA: Diagnosis present

## 2018-01-14 DIAGNOSIS — N183 Chronic kidney disease, stage 3 (moderate): Secondary | ICD-10-CM | POA: Diagnosis present

## 2018-01-14 DIAGNOSIS — I5043 Acute on chronic combined systolic (congestive) and diastolic (congestive) heart failure: Secondary | ICD-10-CM | POA: Diagnosis present

## 2018-01-14 DIAGNOSIS — Z7982 Long term (current) use of aspirin: Secondary | ICD-10-CM | POA: Diagnosis not present

## 2018-01-14 DIAGNOSIS — Z951 Presence of aortocoronary bypass graft: Secondary | ICD-10-CM | POA: Diagnosis not present

## 2018-01-14 DIAGNOSIS — Z85528 Personal history of other malignant neoplasm of kidney: Secondary | ICD-10-CM | POA: Diagnosis not present

## 2018-01-14 DIAGNOSIS — D631 Anemia in chronic kidney disease: Secondary | ICD-10-CM | POA: Diagnosis present

## 2018-01-14 DIAGNOSIS — I255 Ischemic cardiomyopathy: Secondary | ICD-10-CM | POA: Diagnosis present

## 2018-01-14 DIAGNOSIS — I25810 Atherosclerosis of coronary artery bypass graft(s) without angina pectoris: Secondary | ICD-10-CM | POA: Diagnosis not present

## 2018-01-14 DIAGNOSIS — N179 Acute kidney failure, unspecified: Secondary | ICD-10-CM | POA: Diagnosis present

## 2018-01-14 DIAGNOSIS — Z806 Family history of leukemia: Secondary | ICD-10-CM | POA: Diagnosis not present

## 2018-01-14 DIAGNOSIS — Z8249 Family history of ischemic heart disease and other diseases of the circulatory system: Secondary | ICD-10-CM | POA: Diagnosis not present

## 2018-01-14 DIAGNOSIS — Z66 Do not resuscitate: Secondary | ICD-10-CM | POA: Diagnosis present

## 2018-01-14 LAB — BASIC METABOLIC PANEL
ANION GAP: 7 (ref 5–15)
BUN: 16 mg/dL (ref 8–23)
CHLORIDE: 106 mmol/L (ref 98–111)
CO2: 26 mmol/L (ref 22–32)
Calcium: 8.6 mg/dL — ABNORMAL LOW (ref 8.9–10.3)
Creatinine, Ser: 1.65 mg/dL — ABNORMAL HIGH (ref 0.61–1.24)
GFR calc non Af Amer: 40 mL/min — ABNORMAL LOW (ref >60)
GFR, EST AFRICAN AMERICAN: 47 mL/min — AB (ref >60)
Glucose, Bld: 164 mg/dL — ABNORMAL HIGH (ref 70–99)
POTASSIUM: 4.1 mmol/L (ref 3.5–5.1)
SODIUM: 139 mmol/L (ref 135–145)

## 2018-01-14 LAB — TROPONIN I: TROPONIN I: 5.16 ng/mL — AB (ref <0.03)

## 2018-01-14 LAB — GLUCOSE, CAPILLARY
GLUCOSE-CAPILLARY: 113 mg/dL — AB (ref 70–99)
GLUCOSE-CAPILLARY: 133 mg/dL — AB (ref 70–99)
GLUCOSE-CAPILLARY: 194 mg/dL — AB (ref 70–99)
GLUCOSE-CAPILLARY: 200 mg/dL — AB (ref 70–99)
Glucose-Capillary: 197 mg/dL — ABNORMAL HIGH (ref 70–99)

## 2018-01-14 MED ORDER — ONDANSETRON HCL 4 MG/2ML IJ SOLN: 4.0000 mg | Freq: Four times a day (QID) | INTRAMUSCULAR | Status: DC | PRN

## 2018-01-14 MED ORDER — HEPARIN SODIUM (PORCINE) 5000 UNIT/ML IJ SOLN
5000.0000 [IU] | Freq: Three times a day (TID) | INTRAMUSCULAR | Status: DC
  Administered 2018-01-14 – 2018-01-15 (×3): 5000 [IU] via SUBCUTANEOUS
  Filled 2018-01-14 (×4): qty 1

## 2018-01-14 MED ORDER — SODIUM CHLORIDE 0.9 % IV SOLN: 250.0000 mL | INTRAVENOUS | Status: DC | PRN

## 2018-01-14 MED ORDER — INSULIN ASPART 100 UNIT/ML ~~LOC~~ SOLN
0.0000 [IU] | Freq: Three times a day (TID) | SUBCUTANEOUS | Status: DC
  Administered 2018-01-14: 2 [IU] via SUBCUTANEOUS
  Administered 2018-01-14: 1 [IU] via SUBCUTANEOUS
  Administered 2018-01-15: 2 [IU] via SUBCUTANEOUS

## 2018-01-14 MED ORDER — SODIUM CHLORIDE 0.9% FLUSH
3.0000 mL | Freq: Two times a day (BID) | INTRAVENOUS | Status: DC
  Administered 2018-01-14 – 2018-01-15 (×3): 3 mL via INTRAVENOUS

## 2018-01-14 MED ORDER — ISOSORBIDE MONONITRATE ER 30 MG PO TB24
30.0000 mg | ORAL_TABLET | Freq: Every day | ORAL | Status: DC
  Filled 2018-01-14: qty 1

## 2018-01-14 MED ORDER — FUROSEMIDE 10 MG/ML IJ SOLN
20.0000 mg | Freq: Two times a day (BID) | INTRAMUSCULAR | Status: DC
  Administered 2018-01-14 – 2018-01-15 (×2): 20 mg via INTRAVENOUS
  Filled 2018-01-14 (×2): qty 2

## 2018-01-14 MED ORDER — CLOPIDOGREL BISULFATE 75 MG PO TABS
75.0000 mg | ORAL_TABLET | Freq: Every day | ORAL | Status: DC
  Administered 2018-01-14 – 2018-01-15 (×2): 75 mg via ORAL
  Filled 2018-01-14 (×2): qty 1

## 2018-01-14 MED ORDER — FERROUS SULFATE 325 (65 FE) MG PO TABS
325.0000 mg | ORAL_TABLET | Freq: Every day | ORAL | Status: DC
  Administered 2018-01-14 – 2018-01-15 (×2): 325 mg via ORAL
  Filled 2018-01-14 (×2): qty 1

## 2018-01-14 MED ORDER — SODIUM CHLORIDE 0.9% FLUSH: 3.0000 mL | INTRAVENOUS | Status: DC | PRN

## 2018-01-14 MED ORDER — ASPIRIN EC 81 MG PO TBEC
81.0000 mg | DELAYED_RELEASE_TABLET | Freq: Every day | ORAL | Status: DC
  Administered 2018-01-14 – 2018-01-15 (×2): 81 mg via ORAL
  Filled 2018-01-14 (×2): qty 1

## 2018-01-14 MED ORDER — FUROSEMIDE 10 MG/ML IJ SOLN
40.0000 mg | Freq: Two times a day (BID) | INTRAMUSCULAR | Status: DC
  Administered 2018-01-14: 40 mg via INTRAVENOUS
  Filled 2018-01-14: qty 4

## 2018-01-14 MED ORDER — ACETAMINOPHEN 325 MG PO TABS: 650.0000 mg | ORAL_TABLET | ORAL | Status: DC | PRN

## 2018-01-14 MED ORDER — CARVEDILOL 6.25 MG PO TABS
6.2500 mg | ORAL_TABLET | Freq: Two times a day (BID) | ORAL | Status: DC
  Administered 2018-01-14 – 2018-01-15 (×3): 6.25 mg via ORAL
  Filled 2018-01-14 (×3): qty 1

## 2018-01-14 MED ORDER — ATORVASTATIN CALCIUM 40 MG PO TABS
40.0000 mg | ORAL_TABLET | Freq: Every day | ORAL | Status: DC
  Administered 2018-01-14 – 2018-01-15 (×2): 40 mg via ORAL
  Filled 2018-01-14 (×2): qty 1

## 2018-01-14 NOTE — Progress Notes (Signed)
Progress Note    Trevor [name]  OIT:[id number] DOB: 30-Sep-1945  DOA: 01/13/2018 PCP: [name], MD    Brief Narrative:     Medical records reviewed and are as summarized below:  Trevor [name] is an 72 y.o. male with medical history significant of advanced coronary artery disease (and history of CABG) with most recent cardiac cath on 01/08/2018 which showed critical three-vessel obstructive coronary artery disease for which medical management and palliative approach was recommended, diabetes mellitus, hypertension, hyperlipidemia, who presents to the hospital with chief complaint of shortness of breath.  He was recently admitted for NSTEMI and discharged about 3 days ago.  He states that he felt well when he got home on Thursday evening, however when he woke up on Friday he noticed that he is becoming more short of breath.  Since then, he has been unable to ambulate much before needing to stop and catch his breath, and also unable to lay flat.  He is also been complaining of chest discomfort on and off especially when he gets short of breath.  Over the last 2 days he also has noticed that his legs have become more swollen around his ankles.  He does not recall having the swelling when he left home but he is not sure.  He denies any fever or chills, denies any abdominal pain, nausea vomiting or diarrhea.  Assessment/Plan:   Active Problems:   Coronary artery disease   Acute kidney injury ([place])   3-vessel coronary artery disease   Diabetes mellitus type 2 in nonobese (HCC)   Hypertension   CKD (chronic kidney disease), stage III (HCC)   CHF (congestive heart failure) (HCC)  Acute on chronic combined CHF -Most recent 2D echo showed an EF of 20-25% -Received IV Lasix in the ED- I/Os not kept unfortunately but patient says he had a good response -crackles at bases in lungs but no LE edema -daily weight -continue IV Lasix BID and monitor for response -home O2 study -high risk of  arrhythmia with low EF- monitor on tele  Advanced coronary artery disease /recent hospitalization for NSTEMI / history of CABG 2014 -Troponin was as high as 45 during prior hospital stay, was in the 20 range just 5 days ago, currently around 5.  He denies any active chest pain currently, it may be on the downtrending slope -Continue medical management at this point (during last admission, palliative care was recommended) -per note from Dr. Johnsie Cancel 10/23: Plan: compared to prior study in August 2018 there is progression of disease involving the ostium of the ramus intermediate. Otherwise no change. There are no good options for PCI and patient is not a candidate for CABG due to comorbid conditions. Would recommend correction of metabolic derangements (DKA) and correct anemia. I don't think he is a candidate for any coronary intervention and would recommend medical management and palliative care   Chronic kidney disease stage III -Patient with history of renal cell carcinoma of the left kidney status post nephrectomy on 02/21/2017, currently with solitary kidney.  Creatinine now appears at baseline, continue to closely monitor while getting Lasix  Type 2 diabetes mellitus -Hold home medication -SSI  Hypertension -On Imdur, Coreg, continue  Elevated liver enzymes -nivolumab on hold   Family Communication/Anticipated D/C date and plan/Code Status   DVT prophylaxis: heparin Code Status: dnr Family Communication:  Disposition Plan: home after diuresis   Medical Consultants:    None.     Subjective:   Less LE edema  Objective:    Vitals:   01/14/18 1053 01/14/18 1129 01/14/18 1133 01/14/18 1333  BP: (!) 84/61 (!) 88/60 (!) 88/60 (!) 82/48  Pulse:  90 90   Resp:  20 20   Temp:  98.6 F (37 C) 98.6 F (37 C)   TempSrc:  Oral Oral   SpO2:  100% 100%   Weight:      Height:        Intake/Output Summary (Last 24 hours) at 01/14/2018 1523 Last data filed at 01/14/2018  1300 Gross per 24 hour  Intake 480 ml  Output 1550 ml  Net -1070 ml   Filed Weights   01/13/18 1624 01/14/18 0012 01/14/18 0500  Weight: 78 kg 78.9 kg 78.9 kg    Exam: In bed, NAD Not able to lay flat +BS, soft Crackles at bases but able to carry on conversation/no increased work of breathing A+Ox3  Data Reviewed:   I have personally reviewed following labs and imaging studies:  Labs: Labs show the following:   Basic Metabolic Panel: Recent Labs  Lab 01/08/18 0600 01/08/18 0915 01/08/18 1701 01/13/18 1636 01/14/18 0506  NA 135 135 138 137 139  K 5.8* 5.1 5.1 4.8 4.1  CL 106 110 112* 106 106  CO2 17* 15* 15* 23 26  GLUCOSE 318* 188* 223* 187* 164*  BUN 25* 26* 26* 15 16  CREATININE 1.73* 1.68* 1.69* 1.59* 1.65*  CALCIUM 8.4* 8.1* 8.2* 8.5* 8.6*   GFR Estimated Creatinine Clearance: 43.7 mL/min (A) (by C-G formula based on SCr of 1.65 mg/dL (H)). Liver Function Tests: Recent Labs  Lab 01/08/18 0600 01/13/18 1636  AST 120* 59*  ALT 150* 98*  ALKPHOS 346* 305*  BILITOT 1.7* 1.5*  PROT 5.6* 5.5*  ALBUMIN 2.7* 2.7*   No results for input(s): LIPASE, AMYLASE in the last 168 hours. No results for input(s): AMMONIA in the last 168 hours. Coagulation profile No results for input(s): INR, PROTIME in the last 168 hours.  CBC: Recent Labs  Lab 01/07/18 2112 01/09/18 0225 01/13/18 1636  WBC 10.8* 17.7* 9.9  NEUTROABS  --   --  8.0*  HGB 8.2* 9.5* 9.1*  HCT 28.4* 31.6* 29.6*  MCV 90.2 89.5 89.2  PLT 320 212 154   Cardiac Enzymes: Recent Labs  Lab 01/08/18 0600 01/08/18 0915 01/08/18 1701 01/14/18 1121  TROPONINI 41.84* 45.83* 20.32* 5.16*   BNP (last 3 results) No results for input(s): PROBNP in the last 8760 hours. CBG: Recent Labs  Lab 01/09/18 1136 01/09/18 1634 01/14/18 0002 01/14/18 0736 01/14/18 1127  GLUCAP 241* 281* 200* 133* 194*   D-Dimer: No results for input(s): DDIMER in the last 72 hours. Hgb A1c: No results for input(s):  HGBA1C in the last 72 hours. Lipid Profile: No results for input(s): CHOL, HDL, LDLCALC, TRIG, CHOLHDL, LDLDIRECT in the last 72 hours. Thyroid function studies: No results for input(s): TSH, T4TOTAL, T3FREE, THYROIDAB in the last 72 hours.  Invalid input(s): FREET3 Anemia work up: No results for input(s): VITAMINB12, FOLATE, FERRITIN, TIBC, IRON, RETICCTPCT in the last 72 hours. Sepsis Labs: Recent Labs  Lab 01/07/18 2112 01/07/18 2314 01/08/18 0600 01/08/18 0915 01/09/18 0225 01/13/18 1636  WBC 10.8*  --   --   --  17.7* 9.9  LATICACIDVEN  --  6.07* 3.3* 3.0*  --   --     Microbiology No results found for this or any previous visit (from the past 240 hour(s)).  Procedures and diagnostic studies:  Dg Chest Portable  1 View  Result Date: 01/13/2018 CLINICAL DATA:  Shortness of breath. EXAM: PORTABLE CHEST 1 VIEW COMPARISON:  January 07, 2018 FINDINGS: Stable cardiomegaly. Mild pulmonary venous congestion. Probable small left effusion with atelectasis. IMPRESSION: Cardiomegaly and pulmonary venous congestion. Probable small left effusion with atelectasis. Electronically Signed   By: Dorise Bullion III M.D   On: 01/13/2018 16:59    Medications:   . aspirin EC  81 mg Oral Daily  . atorvastatin  40 mg Oral Daily  . carvedilol  6.25 mg Oral BID WC  . clopidogrel  75 mg Oral Daily  . ferrous sulfate  325 mg Oral Q breakfast  . furosemide  20 mg Intravenous Q12H  . heparin  5,000 Units Subcutaneous Q8H  . insulin aspart  0-9 Units Subcutaneous TID WC  . [START ON 01/15/2018] isosorbide mononitrate  30 mg Oral Daily  . sodium chloride flush  3 mL Intravenous Q12H   Continuous Infusions: . sodium chloride       [name]: 0 days   Geradine Girt  [facility]   *Please refer to [email or web address], password [email or web address] to get updated schedule on who will round on this patient, as hospitalists switch teams weekly. If 7PM-7AM, please contact night-coverage at [email or web address], password [email or web address]  for any overnight needs.  01/14/2018, 3:23 PM

## 2018-01-14 NOTE — Progress Notes (Signed)
Patient ambulated down hall with standby assist only.  Denies dizziness.  Oxygen Sats decreased to 92% with ambulation.  Given Incentive spirometer and instructed on use.

## 2018-01-15 ENCOUNTER — Other Ambulatory Visit: Payer: Self-pay | Admitting: Medical Oncology

## 2018-01-15 DIAGNOSIS — C642 Malignant neoplasm of left kidney, except renal pelvis: Secondary | ICD-10-CM

## 2018-01-15 LAB — CBC
HEMATOCRIT: 27.5 % — AB (ref 39.0–52.0)
HEMOGLOBIN: 8.4 g/dL — AB (ref 13.0–17.0)
MCH: 26.7 pg (ref 26.0–34.0)
MCHC: 30.5 g/dL (ref 30.0–36.0)
MCV: 87.3 fL (ref 80.0–100.0)
NRBC: 0 % (ref 0.0–0.2)
Platelets: 122 10*3/uL — ABNORMAL LOW (ref 150–400)
RBC: 3.15 MIL/uL — ABNORMAL LOW (ref 4.22–5.81)
RDW: 19.7 % — ABNORMAL HIGH (ref 11.5–15.5)
WBC: 6.9 10*3/uL (ref 4.0–10.5)

## 2018-01-15 LAB — BASIC METABOLIC PANEL
Anion gap: 8 (ref 5–15)
BUN: 20 mg/dL (ref 8–23)
CHLORIDE: 103 mmol/L (ref 98–111)
CO2: 28 mmol/L (ref 22–32)
Calcium: 8.4 mg/dL — ABNORMAL LOW (ref 8.9–10.3)
Creatinine, Ser: 1.98 mg/dL — ABNORMAL HIGH (ref 0.61–1.24)
GFR, EST AFRICAN AMERICAN: 37 mL/min — AB (ref >60)
GFR, EST NON AFRICAN AMERICAN: 32 mL/min — AB (ref >60)
Glucose, Bld: 158 mg/dL — ABNORMAL HIGH (ref 70–99)
Potassium: 3.9 mmol/L (ref 3.5–5.1)
SODIUM: 139 mmol/L (ref 135–145)

## 2018-01-15 LAB — GLUCOSE, CAPILLARY
GLUCOSE-CAPILLARY: 113 mg/dL — AB (ref 70–99)
GLUCOSE-CAPILLARY: 178 mg/dL — AB (ref 70–99)

## 2018-01-15 MED ORDER — FUROSEMIDE 20 MG PO TABS: 20.0000 mg | ORAL_TABLET | Freq: Every day | ORAL | 11 refills | Status: DC | PRN

## 2018-01-15 NOTE — Evaluation (Signed)
Physical Therapy Evaluation Patient Details Name: Trevor [name] MRN: [id number] DOB: 08/02/1945 Today's Date: 01/15/2018   History of Present Illness  72yo male c/o SOB with associated chest discomfort, LE edema. Note recent admission for NSTEMI, recent cardiac cath on 01/08/18 that revealed critical 3 vessel CAD. Admitted for acute on chronic CHF, advanced CAD. PMH CAD, DM, ischemic cardiomyopathy, CVA, R sided numbness, renal cell CA, TIA, CABG, cardiac cath, nephrectomy   Clinical Impression   Patient received in bed, very pleasant and motivated to participate in PT. Able to complete functional bed mobility with mod(I), functional transfers and gait with distant S for safety, no physical assistance or cues required. Able to ambulate approximately 453f with no device and distant S, mild SOB at end of gait distance, HR to 135BPM (approximately 90% of age predicted HRmax) and SpO2 97% on room air. HR recovered appropriately with seated rest. He was left sitting at EOB, all needs met and questions/concerns addressed.  At this point patient does not appear to be in need of skilled PT services in the acute setting or following discharge.  Recommend mobility tech during this hospital stay as well as potential cardiac rehab if medically appropriate moving forward. PT signing off for now- thank you for the referral.     Follow Up Recommendations No PT follow up    Equipment Recommendations  None recommended by PT    Recommendations for Other Services Other (comment)(mobilty tech and possible cardiac rehab )     Precautions / Restrictions Precautions Precautions: Other (comment) Precaution Comments: watch HR  Restrictions Weight Bearing Restrictions: No      Mobility  Bed Mobility Overal bed mobility: Modified Independent             General bed mobility comments: no need for cues or physical assistance   Transfers Overall transfer level: Needs assistance Equipment used:  None Transfers: Sit to/from Stand Sit to Stand: Supervision         General transfer comment: distant S for safety, no physical assist given   Ambulation/Gait Ambulation/Gait assistance: Supervision Gait Distance (Feet): 400 Feet Assistive device: None Gait Pattern/deviations: WFL(Within Functional Limits)     General Gait Details: gait pattern WNL, no significant unsteadiness or LOB noted   Stairs            Wheelchair Mobility    Modified Rankin (Stroke Patients Only)       Balance Overall balance assessment: No apparent balance deficits (not formally assessed)                                           Pertinent Vitals/Pain Pain Assessment: No/denies pain    Home Living Family/patient expects to be discharged to:: Private residence Living Arrangements: Other relatives Available Help at Discharge: Family;Available 24 hours/day Type of Home: House Home Access: Stairs to enter Entrance Stairs-Rails: Can reach both Entrance Stairs-Number of Steps: 3 Home Layout: One level Home Equipment: None      Prior Function Level of Independence: Independent               Hand Dominance        Extremity/Trunk Assessment   Upper Extremity Assessment Upper Extremity Assessment: Overall WFL for tasks assessed    Lower Extremity Assessment Lower Extremity Assessment: Generalized weakness    Cervical / Trunk Assessment Cervical / Trunk Assessment: Normal  Communication  Communication: No difficulties  Cognition Arousal/Alertness: Awake/alert Behavior During Therapy: WFL for tasks assessed/performed Overall Cognitive Status: Within Functional Limits for tasks assessed                                        General Comments      Exercises     Assessment/Plan    PT Assessment Patent does not need any further PT services  PT Problem List         PT Treatment Interventions      PT Goals (Current goals can  be found in the Care Plan section)  Acute Rehab PT Goals Patient Stated Goal: to go home, feel better  PT Goal Formulation: With patient Time For Goal Achievement: 01/29/18 Potential to Achieve Goals: Good    Frequency     Barriers to discharge        Co-evaluation               AM-PAC PT "6 Clicks" Daily Activity  Outcome Measure Difficulty turning over in bed (including adjusting bedclothes, sheets and blankets)?: None Difficulty moving from lying on back to sitting on the side of the bed? : None Difficulty sitting down on and standing up from a chair with arms (e.g., wheelchair, bedside commode, etc,.)?: None Help needed moving to and from a bed to chair (including a wheelchair)?: None Help needed walking in hospital room?: None Help needed climbing 3-5 steps with a railing? : A Little 6 Click Score: 23    End of Session   Activity Tolerance: Patient tolerated treatment well Patient left: in bed;with call bell/phone within reach(sitting at EOB )   PT Visit Diagnosis: Muscle weakness (generalized) (M62.81)    Time: 2751-7001 PT Time Calculation (min) (ACUTE ONLY): 17 min   Charges:   PT Evaluation $PT Eval Moderate Complexity: 1 Mod          Deniece Ree PT, DPT, CBIS  Supplemental Physical Therapist [place]    Pager [phone] Acute Rehab Office [phone]

## 2018-01-15 NOTE — Progress Notes (Signed)
Patient received discharge information and acknowledged understanding of it. Patient IV was removed.  

## 2018-01-16 ENCOUNTER — Inpatient Hospital Stay: Payer: Medicare Other

## 2018-01-16 ENCOUNTER — Encounter: Payer: Self-pay | Admitting: Internal Medicine

## 2018-01-16 ENCOUNTER — Telehealth: Payer: Self-pay | Admitting: Internal Medicine

## 2018-01-16 ENCOUNTER — Inpatient Hospital Stay: Payer: Medicare Other | Admitting: Internal Medicine

## 2018-01-16 VITALS — BP 98/69 | HR 109 | Temp 98.2°F | Resp 18 | Ht 71.0 in | Wt 171.2 lb

## 2018-01-16 DIAGNOSIS — D500 Iron deficiency anemia secondary to blood loss (chronic): Secondary | ICD-10-CM

## 2018-01-16 DIAGNOSIS — C642 Malignant neoplasm of left kidney, except renal pelvis: Secondary | ICD-10-CM

## 2018-01-16 DIAGNOSIS — Z9221 Personal history of antineoplastic chemotherapy: Secondary | ICD-10-CM | POA: Diagnosis not present

## 2018-01-16 DIAGNOSIS — Z8673 Personal history of transient ischemic attack (TIA), and cerebral infarction without residual deficits: Secondary | ICD-10-CM | POA: Diagnosis not present

## 2018-01-16 DIAGNOSIS — R918 Other nonspecific abnormal finding of lung field: Secondary | ICD-10-CM

## 2018-01-16 DIAGNOSIS — I110 Hypertensive heart disease with heart failure: Secondary | ICD-10-CM

## 2018-01-16 DIAGNOSIS — I2510 Atherosclerotic heart disease of native coronary artery without angina pectoris: Secondary | ICD-10-CM

## 2018-01-16 DIAGNOSIS — R531 Weakness: Secondary | ICD-10-CM | POA: Diagnosis not present

## 2018-01-16 DIAGNOSIS — R5383 Other fatigue: Secondary | ICD-10-CM

## 2018-01-16 DIAGNOSIS — M1990 Unspecified osteoarthritis, unspecified site: Secondary | ICD-10-CM | POA: Diagnosis not present

## 2018-01-16 DIAGNOSIS — Z5112 Encounter for antineoplastic immunotherapy: Secondary | ICD-10-CM

## 2018-01-16 DIAGNOSIS — I5043 Acute on chronic combined systolic (congestive) and diastolic (congestive) heart failure: Secondary | ICD-10-CM

## 2018-01-16 DIAGNOSIS — Z7982 Long term (current) use of aspirin: Secondary | ICD-10-CM

## 2018-01-16 DIAGNOSIS — E785 Hyperlipidemia, unspecified: Secondary | ICD-10-CM | POA: Diagnosis not present

## 2018-01-16 DIAGNOSIS — I255 Ischemic cardiomyopathy: Secondary | ICD-10-CM

## 2018-01-16 DIAGNOSIS — E119 Type 2 diabetes mellitus without complications: Secondary | ICD-10-CM | POA: Diagnosis not present

## 2018-01-16 DIAGNOSIS — M129 Arthropathy, unspecified: Secondary | ICD-10-CM

## 2018-01-16 DIAGNOSIS — Z905 Acquired absence of kidney: Secondary | ICD-10-CM

## 2018-01-16 DIAGNOSIS — I252 Old myocardial infarction: Secondary | ICD-10-CM | POA: Diagnosis not present

## 2018-01-16 DIAGNOSIS — I509 Heart failure, unspecified: Secondary | ICD-10-CM | POA: Diagnosis not present

## 2018-01-16 DIAGNOSIS — Z7984 Long term (current) use of oral hypoglycemic drugs: Secondary | ICD-10-CM | POA: Diagnosis not present

## 2018-01-16 DIAGNOSIS — K716 Toxic liver disease with hepatitis, not elsewhere classified: Secondary | ICD-10-CM

## 2018-01-16 DIAGNOSIS — T50905A Adverse effect of unspecified drugs, medicaments and biological substances, initial encounter: Secondary | ICD-10-CM

## 2018-01-16 DIAGNOSIS — R197 Diarrhea, unspecified: Secondary | ICD-10-CM

## 2018-01-16 DIAGNOSIS — R200 Anesthesia of skin: Secondary | ICD-10-CM | POA: Diagnosis not present

## 2018-01-16 LAB — CBC WITH DIFFERENTIAL (CANCER CENTER ONLY)
Abs Immature Granulocytes: 0.06 10*3/uL (ref 0.00–0.07)
BASOS ABS: 0 10*3/uL (ref 0.0–0.1)
BASOS PCT: 0 %
Eosinophils Absolute: 0.2 10*3/uL (ref 0.0–0.5)
Eosinophils Relative: 2 %
HCT: 30.9 % — ABNORMAL LOW (ref 39.0–52.0)
Hemoglobin: 9.7 g/dL — ABNORMAL LOW (ref 13.0–17.0)
IMMATURE GRANULOCYTES: 1 %
Lymphocytes Relative: 9 %
Lymphs Abs: 0.8 10*3/uL (ref 0.7–4.0)
MCH: 27.4 pg (ref 26.0–34.0)
MCHC: 31.4 g/dL (ref 30.0–36.0)
MCV: 87.3 fL (ref 80.0–100.0)
Monocytes Absolute: 0.8 10*3/uL (ref 0.1–1.0)
Monocytes Relative: 9 %
NEUTROS PCT: 79 %
NRBC: 0 % (ref 0.0–0.2)
Neutro Abs: 7.4 10*3/uL (ref 1.7–7.7)
PLATELETS: 187 10*3/uL (ref 150–400)
RBC: 3.54 MIL/uL — AB (ref 4.22–5.81)
RDW: 19.6 % — AB (ref 11.5–15.5)
WBC: 9.2 10*3/uL (ref 4.0–10.5)

## 2018-01-16 LAB — CMP (CANCER CENTER ONLY)
ALBUMIN: 2.8 g/dL — AB (ref 3.5–5.0)
ALT: 98 U/L — ABNORMAL HIGH (ref 0–44)
ANION GAP: 10 (ref 5–15)
AST: 60 U/L — AB (ref 15–41)
Alkaline Phosphatase: 408 U/L — ABNORMAL HIGH (ref 38–126)
BUN: 24 mg/dL — ABNORMAL HIGH (ref 8–23)
CHLORIDE: 101 mmol/L (ref 98–111)
CO2: 28 mmol/L (ref 22–32)
Calcium: 9 mg/dL (ref 8.9–10.3)
Creatinine: 1.89 mg/dL — ABNORMAL HIGH (ref 0.61–1.24)
GFR, Est AFR Am: 40 mL/min — ABNORMAL LOW (ref >60)
GFR, Estimated: 34 mL/min — ABNORMAL LOW (ref >60)
GLUCOSE: 214 mg/dL — AB (ref 70–99)
POTASSIUM: 4.1 mmol/L (ref 3.5–5.1)
Sodium: 139 mmol/L (ref 135–145)
Total Bilirubin: 1.5 mg/dL — ABNORMAL HIGH (ref 0.3–1.2)
Total Protein: 6.2 g/dL — ABNORMAL LOW (ref 6.5–8.1)

## 2018-01-16 NOTE — Discharge Summary (Signed)
Physician Discharge Summary  Trevor [name] KZL:[id number] DOB: 04/24/1945 DOA: 01/13/2018  PCP: Trevor Kid, MD  Admit date: 01/13/2018 Discharge date: 01/16/2018  Time spent: 40 minutes  Recommendations for Outpatient Follow-up:  1. Follow up outpatient CBC/CMP 2. Follow up with cardiology as scheduled as well as PCP and oncology 3. Follow up volume status.  Pt with some relatively low BP's and acute kidney injury in the setting of diuresis on the day of discharge, but doing well overall without LH or orthostasis.  Given precautions regarding his BP meds.  Follow volume status closely.  Discharged with lasix to take prn based on weight. 4. Follow creatinine closely, it bumped on day of discharge in setting of diuresis 5. Continue discussions regarding goals of care in setting of non revascularizeable CAD and metastatic renal cancer   Discharge Diagnoses:  Active Problems:   Coronary artery disease   Acute kidney injury ([name])   3-vessel coronary artery disease   Diabetes mellitus type 2 in nonobese (HCC)   Hypertension   CKD (chronic kidney disease), stage III (HCC)   CHF (congestive heart failure) (HCC)   Acute on chronic combined systolic (congestive) and diastolic (congestive) heart failure ([name])   Discharge Condition: stable  Diet recommendation: heart healthy  Filed Weights   01/14/18 0012 01/14/18 0500 01/15/18 0609  Weight: 78.9 kg 78.9 kg 76 kg    History of present illness:  Trevor [name] is an 72 y.o. male with medical history significant ofadvanced coronary artery disease (and history of CABG) with most recent cardiac cath on 01/08/2018 which showed critical three-vessel obstructive coronary artery disease for which medical management and palliative approach was recommended, diabetes mellitus, hypertension, hyperlipidemia, who presents to the hospital with chief complaint of shortness of breath. He was recently admitted for NSTEMIand discharged about 3 days ago. He  states that he felt well when he got home on Thursday evening, however when he woke up on Friday he noticed that he is becoming more short of breath. Since then, he has been unable to ambulate much before needing to stop and catch his breath, and also unable to lay flat. He is also been complaining of chest discomfort on and off especially when he gets short of breath. Over the last 2 days he also has noticed that his legs have become more swollen around his ankles. He does not recall having the swelling when he left home but he is not sure. He denies any fever or chills, denies any abdominal pain, nausea vomiting or diarrhea.  He was diuresed IV lasix and on the day of discharge was feeling back to his baseline.  He developed some AKI with diuresis and his blood pressures were relatively low on the day of discharge and as he appeared relatively euvolemic, he was discharged with prn lasix in the setting of his blood pressures and kidney function.      See below for additional details  Hospital Course:  Acute on chronic combined CHF - Most recent 2D echo showed an EF of20-25% (see report from 10/22) - S/p IV lasix -> will discharge with 20 mg PO to use prn especially in setting of his low BP's on the day of discharge - Weight down about 2 kg from admission - Follow up with cardiology as outpatient   Advanced coronary artery disease/recent hospitalization forNSTEMI / history of CABG 2014 -Troponin was as high as 45 during prior hospital stay, was in the 20 range just 5 days ago, currently around 5.  He denies any active chest pain currently, it may be on the downtrending slope -Continue medical management at this point (during last admission, palliative care was recommended) -per note from Dr. Johnsie [name] 10/23: Plan: compared to prior study in August 2018 there is progression of disease involving the ostium of the ramus intermediate. Otherwise no change. There are no good options for PCI and  patient is not Trevor [name] candidate for CABG due to comorbid conditions. Would recommend correction of metabolic derangements (Trevor [name]) and correct anemia. I don't think he is Trevor [name] candidate for any coronary intervention and would recommend medical management and palliative care   AKI on Chronic kidney disease stage III -Patient with history of renal cell carcinoma of the left kidney status post nephrectomy on 02/21/2017, currently with solitary kidney. Creatinine bumped in setting of diuresis.  Will hold lasix and have him use this PRN.  Type 2 diabetes mellitus - resume home beds  Hypertension -On Imdur, Coreg, continue  Elevated liver enzymes -nivolumab on hold  Procedures:  none  Consultations:  none  Discharge Exam: Vitals:   01/15/18 0930 01/15/18 1433  BP: 93/64 92/65  Pulse:    Resp:  18  Temp:  98.8 F (37.1 C)  SpO2:  95%   Feeling well, would like to go home today. Came in with SOB, which is resolved  General: No acute distress. Standing up using the urinal Cardiovascular: Heart sounds show Trevor [name] regular rate, and rhythm.  Lungs: Clear to auscultation bilaterally  Abdomen: Soft, nontender, nondistended  Neurological: Alert and oriented 3. Moves all extremities 4  Cranial nerves II through XII grossly intact. Skin: Warm and dry. No rashes or lesions. Extremities: No clubbing or cyanosis. No edema.  Psychiatric: Mood and affect are normal. Insight and judgment are appropriate.  Discharge Instructions   Discharge Instructions    Call MD for:  difficulty breathing, headache or visual disturbances   Complete by:  As directed    Call MD for:  extreme fatigue   Complete by:  As directed    Call MD for:  persistant dizziness or light-headedness   Complete by:  As directed    Call MD for:  persistant nausea and vomiting   Complete by:  As directed    Call MD for:  redness, tenderness, or signs of infection (pain, swelling, redness, odor or green/yellow discharge around  incision site)   Complete by:  As directed    Call MD for:  severe uncontrolled pain   Complete by:  As directed    Call MD for:  temperature >100.4   Complete by:  As directed    Diet - low sodium heart healthy   Complete by:  As directed    Discharge instructions   Complete by:  As directed    You were seen for Marissa Lowrey heart failure exacerbation.  You improved with lasix.  Your kidney function was worse the day of discharge, so we'll stop the lasix, but you can take this as needed.  Please follow up your kidney function in Kathlyn Leachman few days with your PCP.  Check your weight daily.  If your weight increases more than 2-3 lbs in 1 day or more than 5 lbs in Noha Karasik week, take Jaynia Fendley dose of lasix and call your doctor.    Your blood pressure is Mandy Fitzwater little bit low.  Continue your carvedilol and imdur for now, but if you notice lightheadedness or dizziness, please stop these and follow up with your physician.  There are  not options for stenting or surgery for your heart disease.  Cardiology was recommending palliative care.  I would recommend following up with your PCP or oncologist to discuss potentially seeing palliative care as an outpatient and additional goals of care discussions.  Please follow up with oncology and your PCP as scheduled.  Follow up with cardiology as an outpatient.  Return for new, recurrent, or worsening symptoms.  Please ask your PCP to request records from this hospitalization so they know what was done and what the next steps will be.   Increase activity slowly   Complete by:  As directed      Allergies as of 01/15/2018   No Known Allergies     Medication List    TAKE these medications   aspirin EC 81 MG tablet Take 81 mg by mouth daily.   atorvastatin 40 MG tablet Commonly known as:  LIPITOR Take 1 tablet (40 mg total) by mouth daily.   carvedilol 6.25 MG tablet Commonly known as:  COREG Take 1 tablet (6.25 mg total) by mouth 2 (two) times daily.   clopidogrel 75 MG  tablet Commonly known as:  PLAVIX Take 1 tablet (75 mg total) by mouth daily.   ferrous sulfate 325 (65 FE) MG tablet Take 325 mg by mouth daily with breakfast.   furosemide 20 MG tablet Commonly known as:  LASIX Take 1 tablet (20 mg total) by mouth daily as needed.   isosorbide mononitrate 30 MG 24 hr tablet Commonly known as:  IMDUR Take 1 tablet (30 mg total) by mouth daily.   ondansetron 4 MG disintegrating tablet Commonly known as:  ZOFRAN-ODT Take 1 tablet (4 mg total) by mouth every 8 (eight) hours as needed for nausea or vomiting.   predniSONE 50 MG tablet Commonly known as:  DELTASONE 4 tablet p.o. daily x1 week, followed by 3 tablets p.o. daily x1 week, followed by 2 tablets p.o. daily x1 week, then 1 tablet p.o. daily x1 week.   sitaGLIPtin 50 MG tablet Commonly known as:  JANUVIA Take 1 tablet (50 mg total) by mouth daily.      No Known Allergies Follow-up Information    Via, [name], MD Follow up.   Specialty:  Family Medicine Contact information: [place] [address] [phone]        Wellington Hampshire, MD .   Specialty:  Cardiology Contact information: [place] [address] [phone]        Curt Bears, MD Follow up.   Specialty:  Oncology Contact information: [place] [address] [phone]            The results of significant diagnostics from this hospitalization (including imaging, microbiology, ancillary and laboratory) are listed below for reference.    Significant Diagnostic Studies: Dg Chest Portable 1 View  Result Date: 01/13/2018 CLINICAL DATA:  Shortness of breath. EXAM: PORTABLE CHEST 1 VIEW COMPARISON:  January 07, 2018 FINDINGS: Stable cardiomegaly. Mild pulmonary venous congestion. Probable small left effusion with atelectasis. IMPRESSION: Cardiomegaly and pulmonary venous congestion. Probable small left effusion with atelectasis.  Electronically Signed   By: Dorise Bullion III M.D   On: 01/13/2018 16:59   Dg Chest Portable 1 View  Result Date: 01/07/2018 CLINICAL DATA:  Chest pain hyperglycemia EXAM: PORTABLE CHEST 1 VIEW COMPARISON:  CT 10/31/2016, radiograph 10/03/2017 FINDINGS: Post sternotomy changes. Cardiomegaly. No focal opacity or pleural effusion. No pneumothorax. IMPRESSION: No active disease.  Stable mild cardiomegaly.  Electronically Signed   By: Donavan Foil M.D.   On: 01/07/2018 21:38   Vas US Carotid  Result Date: 12/31/2017 Carotid Arterial Duplex Study Indications:  In 12/2016, Saahir Prude carotid duplex showed 371/71 cm/s in the RICA and               657/84 cm/s in the LICA. Patient c/o intermittent dizziness.               Multiple falls with recent fall this past Saturday, October 12th.               History of stroke which left him with numbness on the entire right               side. He denies any other cerebrovascular symptoms. Risk Factors: Hypertension, hyperlipidemia, Diabetes, past history of smoking,               prior MI, coronary artery disease, prior CVA. Performing Technologist: Sharlett Iles RVT  Examination Guidelines: Whittney Steenson complete evaluation includes B-mode imaging, spectral Doppler, color Doppler, and power Doppler as needed of all accessible portions of each vessel. Bilateral testing is considered an integral part of Krystn Dermody complete examination. Limited examinations for reoccurring indications may be performed as noted.  Right Carotid Findings: +----------+-------+-------+--------+------------------------+-----------------+           PSV    EDV    StenosisDescribe                Comments                    cm/s   cm/s                                                     +----------+-------+-------+--------+------------------------+-----------------+ CCA Prox  98     7                                      tortuous           +----------+-------+-------+--------+------------------------+-----------------+ CCA Distal75     15             heterogenous and        intimal                                           irregular               thickening        +----------+-------+-------+--------+------------------------+-----------------+ ICA Prox  265    70     60-79%  heterogenous and                                                          irregular                                 +----------+-------+-------+--------+------------------------+-----------------+ ICA Mid   91  25             heterogenous and        tortuous                                          irregular                                 +----------+-------+-------+--------+------------------------+-----------------+ ICA Distal85     31             heterogenous and                                                          irregular                                 +----------+-------+-------+--------+------------------------+-----------------+ ECA       75     16             heterogenous and                                                          irregular                                 +----------+-------+-------+--------+------------------------+-----------------+ +----------+--------+-------+----------------+-------------------+           PSV cm/sEDV cmsDescribe        Arm Pressure (mmHG) +----------+--------+-------+----------------+-------------------+ [id number]            Multiphasic, WNL                    +----------+--------+-------+----------------+-------------------+ +---------+--------+--+--------+--+---------+ VertebralPSV cm/s42EDV cm/s10Antegrade +---------+--------+--+--------+--+---------+  Left Carotid Findings: +----------+-------+-------+--------+------------------------+-----------------+           PSV    EDV    StenosisDescribe                Comments                     cm/s   cm/s                                                     +----------+-------+-------+--------+------------------------+-----------------+ CCA Prox  65     8                                                        +----------+-------+-------+--------+------------------------+-----------------+ CCA Distal64     14  intimal                                                                   thickening        +----------+-------+-------+--------+------------------------+-----------------+ ICA Prox  84     22     1-39%   heterogenous and                                                          irregular                                 +----------+-------+-------+--------+------------------------+-----------------+ ICA Mid   76     23                                     tortuous          +----------+-------+-------+--------+------------------------+-----------------+ ICA Distal56     23                                     tortuous          +----------+-------+-------+--------+------------------------+-----------------+ ECA       237    11             heterogenous and                                                          irregular                                 +----------+-------+-------+--------+------------------------+-----------------+ +----------+--------+--------+----------------+-------------------+ SubclavianPSV cm/sEDV cm/sDescribe        Arm Pressure (mmHG) +----------+--------+--------+----------------+-------------------+           78              Multiphasic, WNL                    +----------+--------+--------+----------------+-------------------+ +---------+--------+--+--------+--+---------+ VertebralPSV cm/s41EDV cm/s14Antegrade +---------+--------+--+--------+--+---------+  Summary: Right Carotid: Velocities in the right ICA are consistent with Lori Popowski 60-79%                 stenosis. Non-hemodynamically significant plaque <50% noted in                the CCA. The RICA velocities are elevated and have decreased                compared to the prior exam. Left Carotid: Velocities in the left ICA are consistent with Oshay Stranahan 1-39% stenosis.               The LICA velocities have decreased compared to the prior exam and  is now within normal range. Vertebrals:  Bilateral vertebral arteries demonstrate antegrade flow. Subclavians: Normal flow hemodynamics were seen in bilateral subclavian              arteries. *See table(s) above for measurements and observations. Suggest follow up study in 12 months. Electronically signed by Quay Burow MD on 12/31/2017 at 3:57:30 PM.    Final     Microbiology: No results found for this or any previous visit (from the past 240 hour(s)).   Labs: Basic Metabolic Panel: Recent Labs  Lab 01/13/18 1636 01/14/18 0506 01/15/18 0339 01/16/18 1107  NA 137 139 139 139  K 4.8 4.1 3.9 4.1  CL 106 106 103 101  CO2 23 26 28 28   GLUCOSE 187* 164* 158* 214*  BUN 15 16 20  24*  CREATININE 1.59* 1.65* 1.98* 1.89*  CALCIUM 8.5* 8.6* 8.4* 9.0   Liver Function Tests: Recent Labs  Lab 01/13/18 1636 01/16/18 1107  AST 59* 60*  ALT 98* 98*  ALKPHOS 305* 408*  BILITOT 1.5* 1.5*  PROT 5.5* 6.2*  ALBUMIN 2.7* 2.8*   No results for input(s): LIPASE, AMYLASE in the last 168 hours. No results for input(s): AMMONIA in the last 168 hours. CBC: Recent Labs  Lab 01/13/18 1636 01/15/18 0339 01/16/18 1107  WBC 9.9 6.9 9.2  NEUTROABS 8.0*  --  7.4  HGB 9.1* 8.4* 9.7*  HCT 29.6* 27.5* 30.9*  MCV 89.2 87.3 87.3  PLT 154 122* 187   Cardiac Enzymes: Recent Labs  Lab 01/14/18 1121  TROPONINI 5.16*   BNP: BNP (last 3 results) Recent Labs    01/13/18 1636  BNP 3,924.3*    ProBNP (last 3 results) No results for input(s): PROBNP in the last 8760 hours.  CBG: Recent Labs  Lab 01/14/18 1127 01/14/18 1632 01/14/18 2243  01/15/18 0722 01/15/18 1147  GLUCAP 194* 113* 197* 113* 178*       Signed:  Fayrene Helper MD.  [facility] 01/16/2018, 6:50 PM

## 2018-01-16 NOTE — Telephone Encounter (Signed)
Appts scheduled avs/calendar printe per 10/30 los

## 2018-01-16 NOTE — Progress Notes (Signed)
[place] [phone]   Fax:([phone]) [phone]  OFFICE PROGRESS NOTE  Via, [name], MD [place] [address]  DIAGNOSIS: [name].The patient presented with an11 cm left kidney mass and pulmonary nodules.  PRIOR THERAPY: 1) Status post left nephrectomy on 02/21/2017. 20 Cabometyx 60 mg daily. Started 01/21/2017.Status post 1 month of treatment. Treatment was then placed on hold for nephrectomy. Resuming treatment on 03/22/2017.  Status post 3 weeks of treatment.  The patient was advised to hold Cabometyx starting on 04/11/2017.  This dose will be changed to 40 mg p.o. daily.This was a started May 12, 2017.  Status post 2 months of treatment discontinued secondary to intolerance.   CURRENT THERAPY: Immunotherapy with Nivolumab 418 mg IV every 4 weeks status post 5 cycles.  His treatment is currently on hold secondary to immunotherapy mediated hepatitis as well as congestive heart failure.  INTERVAL HISTORY: Trevor [name] 71 y.o. male returns to the clinic today for follow-up visit accompanied by his brother.  The patient is feeling fine today with no concerning complaints except for fatigue and mild swelling of the lower extremities.  He has been off treatment for several weeks secondary to immunotherapy mediated hepatitis and he was on a taper dose of prednisone and his liver enzymes had significantly improved.  The patient was recently admitted to [facility] with NSTEMI as well as congestive heart failure.  He was treated aggressively with diuretics as well as carvedilol.  He is feeling a little bit better today.  He is currently off prednisone.  He denied having any chest pain but has shortness of breath with exertion with no cough or hemoptysis.  He denied having any fever or chills.  He has no nausea, vomiting, diarrhea or constipation.  He denied having any headache or visual changes.  The patient is  here today for evaluation and recommendation regarding his condition.  MEDICAL HISTORY: Past Medical History:  Diagnosis Date  . 3-vessel coronary artery disease   . Anemia    2 iron infusions  . Arthritis   . Carotid disease, bilateral (HCC)    moderate  . Chronic systolic heart failure (HCC)    EF 45%  . Coronary artery disease    06/2012:STEMI s/p CABG; 8/18 NSTEMI  . Diabetes mellitus without complication ([place])   . Diarrhea   . Dyspnea    mild  . History of blood transfusion   . Hyperlipidemia   . Ischemic cardiomyopathy    Ejection fraction of 35-40% initially, 45 - 50% on echo 2014, 53% by perfusion study 2016  . Mild mitral regurgitation   . Numbness    Right side since stroke  . OA (osteoarthritis)   . PAD (peripheral artery disease) ([place])   . Pulmonary nodule   . Renal cell cancer, left ([place]) 12/2016   Associated w/ pulmonary nodules  . Stroke ([place]) 2018  . TIA (transient ischemic attack) 10/2016  . Weakness generalized   . Wears glasses     ALLERGIES:  has No Known Allergies.  MEDICATIONS:  Current Outpatient Medications  Medication Sig Dispense Refill  . aspirin EC 81 MG tablet Take 81 mg by mouth daily.    Marland Kitchen atorvastatin (LIPITOR) 40 MG tablet Take 1 tablet (40 mg total) by mouth daily. 30 tablet 6  . carvedilol (COREG) 6.25 MG tablet Take 1 tablet (6.25 mg total) by mouth 2 (two) times daily. 180 tablet 1  .  clopidogrel (PLAVIX) 75 MG tablet Take 1 tablet (75 mg total) by mouth daily. 30 tablet 0  . ferrous sulfate 325 (65 FE) MG tablet Take 325 mg by mouth daily with breakfast.    . furosemide (LASIX) 20 MG tablet Take 1 tablet (20 mg total) by mouth daily as needed. 30 tablet 11  . isosorbide mononitrate (IMDUR) 30 MG 24 hr tablet Take 1 tablet (30 mg total) by mouth daily. 30 tablet 0  . ondansetron (ZOFRAN ODT) 4 MG disintegrating tablet Take 1 tablet (4 mg total) by mouth every 8 (eight) hours as needed for nausea or vomiting. 20 tablet 0  .  predniSONE (DELTASONE) 50 MG tablet 4 tablet p.o. daily x1 week, followed by 3 tablets p.o. daily x1 week, followed by 2 tablets p.o. daily x1 week, then 1 tablet p.o. daily x1 week. (Patient not taking: Reported on 01/13/2018) 75 tablet 0  . sitaGLIPtin (JANUVIA) 50 MG tablet Take 1 tablet (50 mg total) by mouth daily. 30 tablet 0   No current facility-administered medications for this visit.     SURGICAL HISTORY:  Past Surgical History:  Procedure Laterality Date  . CARDIOVASCULAR STRESS TEST    . CORONARY ARTERY BYPASS GRAFT N/A 07/05/2012   Procedure: CORONARY ARTERY BYPASS GRAFTING (CABG);  Surgeon: Ivin Poot, MD;  Location: [facility];  Service: Open Heart Surgery;  Laterality: N/A;  . INTRAOPERATIVE TRANSESOPHAGEAL ECHOCARDIOGRAM N/A 07/05/2012   Procedure: INTRAOPERATIVE TRANSESOPHAGEAL ECHOCARDIOGRAM;  Surgeon: Ivin Poot, MD;  Location: [facility];  Service: Open Heart Surgery;  Laterality: N/A;  . LEFT HEART CATH AND CORS/GRAFTS ANGIOGRAPHY N/A 11/14/2016   Procedure: LEFT HEART CATH AND CORS/GRAFTS ANGIOGRAPHY;  Surgeon: Belva Crome, MD;  Location: [facility];  Service: Cardiovascular;  Laterality: N/A;  . LEFT HEART CATH AND CORS/GRAFTS ANGIOGRAPHY N/A 01/08/2018   Procedure: LEFT HEART CATH AND CORS/GRAFTS ANGIOGRAPHY;  Surgeon: Martinique, Peter M, MD;  Location: [facility];  Service: Cardiovascular;  Laterality: N/A;  . LEFT HEART CATHETERIZATION WITH CORONARY ANGIOGRAM N/A 07/04/2012   Procedure: LEFT HEART CATHETERIZATION WITH CORONARY ANGIOGRAM;  Surgeon: Wellington Hampshire, MD;  Location: [facility];  Service: Cardiovascular;  Laterality: N/A;  . RENAL BIOPSY    . ROBOT ASSISTED LAPAROSCOPIC NEPHRECTOMY Left 02/21/2017   Procedure: XI ROBOTIC ASSISTED LAPAROSCOPIC NEPHRECTOMY;  Surgeon: Alexis Frock, MD;  Location: [facility];  Service: Urology;  Laterality: Left;    REVIEW OF SYSTEMS:  A comprehensive review of systems was negative except for: Constitutional:  positive for fatigue and weight loss Respiratory: positive for dyspnea on exertion   PHYSICAL EXAMINATION: General appearance: alert, cooperative, fatigued and no distress Head: Normocephalic, without obvious abnormality, atraumatic Neck: no adenopathy, no JVD, supple, symmetrical, trachea midline and thyroid not enlarged, symmetric, no tenderness/mass/nodules Lymph nodes: Cervical, supraclavicular, and axillary nodes normal. Resp: clear to auscultation bilaterally Back: symmetric, no curvature. ROM normal. No CVA tenderness. Cardio: regular rate and rhythm, S1, S2 normal, no murmur, click, rub or gallop GI: soft, non-tender; bowel sounds normal; no masses,  no organomegaly Extremities: extremities normal, atraumatic, no cyanosis or edema  ECOG PERFORMANCE STATUS: 1 - Symptomatic but completely ambulatory  Blood pressure 98/69, pulse (!) 109, temperature 98.2 F (36.8 C), temperature source Oral, resp. rate 18, height 5\' 11"  (1.803 m), weight 171 lb 3.2 oz (77.7 kg), SpO2 99 %.  LABORATORY DATA: Lab Results  Component Value Date   WBC 9.2 01/16/2018   HGB 9.7 (L) 01/16/2018  HCT 30.9 (L) 01/16/2018   MCV 87.3 01/16/2018   PLT 187 01/16/2018      Chemistry      Component Value Date/Time   NA 139 01/16/2018 1107   NA 141 03/22/2017 0919   K 4.1 01/16/2018 1107   K 5.5 (H) 03/22/2017 0919   CL 101 01/16/2018 1107   CO2 28 01/16/2018 1107   CO2 27 03/22/2017 0919   BUN 24 (H) 01/16/2018 1107   BUN 18.1 03/22/2017 0919   CREATININE 1.89 (H) 01/16/2018 1107   CREATININE 1.2 03/22/2017 0919      Component Value Date/Time   CALCIUM 9.0 01/16/2018 1107   CALCIUM 9.8 03/22/2017 0919   ALKPHOS 408 (H) 01/16/2018 1107   ALKPHOS 103 03/22/2017 0919   AST 60 (H) 01/16/2018 1107   AST 15 03/22/2017 0919   ALT 98 (H) 01/16/2018 1107   ALT 13 03/22/2017 0919   BILITOT 1.5 (H) 01/16/2018 1107   BILITOT 0.41 03/22/2017 0919       RADIOGRAPHIC STUDIES: Dg Chest Portable 1  View  Result Date: 01/13/2018 CLINICAL DATA:  Shortness of breath. EXAM: PORTABLE CHEST 1 VIEW COMPARISON:  January 07, 2018 FINDINGS: Stable cardiomegaly. Mild pulmonary venous congestion. Probable small left effusion with atelectasis. IMPRESSION: Cardiomegaly and pulmonary venous congestion. Probable small left effusion with atelectasis. Electronically Signed   By: Dorise Bullion III M.D   On: 01/13/2018 16:59   Dg Chest Portable 1 View  Result Date: 01/07/2018 CLINICAL DATA:  Chest pain hyperglycemia EXAM: PORTABLE CHEST 1 VIEW COMPARISON:  CT 10/31/2016, radiograph 10/03/2017 FINDINGS: Post sternotomy changes. Cardiomegaly. No focal opacity or pleural effusion. No pneumothorax. IMPRESSION: No active disease.  Stable mild cardiomegaly. Electronically Signed   By: Donavan Foil M.D.   On: 01/07/2018 21:38   Vas US Carotid  Result Date: 12/31/2017 Carotid Arterial Duplex Study Indications:  In 12/2016, a carotid duplex showed 371/71 cm/s in the RICA and               016/01 cm/s in the LICA. Patient c/o intermittent dizziness.               Multiple falls with recent fall this past Saturday, October 12th.               History of stroke which left him with numbness on the entire right               side. He denies any other cerebrovascular symptoms. Risk Factors: Hypertension, hyperlipidemia, Diabetes, past history of smoking,               prior MI, coronary artery disease, prior CVA. Performing Technologist: Sharlett Iles RVT  Examination Guidelines: A complete evaluation includes B-mode imaging, spectral Doppler, color Doppler, and power Doppler as needed of all accessible portions of each vessel. Bilateral testing is considered an integral part of a complete examination. Limited examinations for reoccurring indications may be performed as noted.  Right Carotid Findings: +----------+-------+-------+--------+------------------------+-----------------+           PSV    EDV    StenosisDescribe                 Comments                    cm/s   cm/s                                                     +----------+-------+-------+--------+------------------------+-----------------+  CCA Prox  98     7                                      tortuous          +----------+-------+-------+--------+------------------------+-----------------+ CCA Distal75     15             heterogenous and        intimal                                           irregular               thickening        +----------+-------+-------+--------+------------------------+-----------------+ ICA Prox  265    70     60-79%  heterogenous and                                                          irregular                                 +----------+-------+-------+--------+------------------------+-----------------+ ICA Mid   91     25             heterogenous and        tortuous                                          irregular                                 +----------+-------+-------+--------+------------------------+-----------------+ ICA Distal85     31             heterogenous and                                                          irregular                                 +----------+-------+-------+--------+------------------------+-----------------+ ECA       75     16             heterogenous and                                                          irregular                                 +----------+-------+-------+--------+------------------------+-----------------+ +----------+--------+-------+----------------+-------------------+  PSV cm/sEDV cmsDescribe        Arm Pressure (mmHG) +----------+--------+-------+----------------+-------------------+ [id number]            Multiphasic, WNL                    +----------+--------+-------+----------------+-------------------+  +---------+--------+--+--------+--+---------+ VertebralPSV cm/s42EDV cm/s10Antegrade +---------+--------+--+--------+--+---------+  Left Carotid Findings: +----------+-------+-------+--------+------------------------+-----------------+           PSV    EDV    StenosisDescribe                Comments                    cm/s   cm/s                                                     +----------+-------+-------+--------+------------------------+-----------------+ CCA Prox  65     8                                                        +----------+-------+-------+--------+------------------------+-----------------+ CCA Distal64     14                                     intimal                                                                   thickening        +----------+-------+-------+--------+------------------------+-----------------+ ICA Prox  84     22     1-39%   heterogenous and                                                          irregular                                 +----------+-------+-------+--------+------------------------+-----------------+ ICA Mid   76     23                                     tortuous          +----------+-------+-------+--------+------------------------+-----------------+ ICA Distal56     23                                     tortuous          +----------+-------+-------+--------+------------------------+-----------------+ ECA       237    11             heterogenous and  irregular                                 +----------+-------+-------+--------+------------------------+-----------------+ +----------+--------+--------+----------------+-------------------+ SubclavianPSV cm/sEDV cm/sDescribe        Arm Pressure (mmHG) +----------+--------+--------+----------------+-------------------+           78              Multiphasic,  WNL                    +----------+--------+--------+----------------+-------------------+ +---------+--------+--+--------+--+---------+ VertebralPSV cm/s41EDV cm/s14Antegrade +---------+--------+--+--------+--+---------+  Summary: Right Carotid: Velocities in the right ICA are consistent with a 60-79%                stenosis. Non-hemodynamically significant plaque <50% noted in                the CCA. The RICA velocities are elevated and have decreased                compared to the prior exam. Left Carotid: Velocities in the left ICA are consistent with a 1-39% stenosis.               The LICA velocities have decreased compared to the prior exam and               is now within normal range. Vertebrals:  Bilateral vertebral arteries demonstrate antegrade flow. Subclavians: Normal flow hemodynamics were seen in bilateral subclavian              arteries. *See table(s) above for measurements and observations. Suggest follow up study in 12 months. Electronically signed by Quay Burow MD on 12/31/2017 at 3:57:30 PM.    Final     ASSESSMENT AND PLAN: This is a very pleasant 72 years old white male diagnosed with metastatic renal cell carcinoma, chromophobe type presented with large left kidney mass as well as multiple bilateral pulmonary nodules.  He is status post left nephrectomy on February 21, 2017.  The patient is currently on treatment with Cabometyx initially at a dose of 60 mg p.o. daily but he has a rough time tolerating this treatment with several episodes of diarrhea as well as nausea. His treatment was switched to Cabometyx at a dose of 40 mg p.o. Daily status post 2 months of treatment.  He has a stable disease but the treatment was discontinued secondary to intolerance.   He was started on treatment with immunotherapy with Nivolumab 480 mg IV every 4 weeks status post 5 cycles.   He has several complications recently including immunotherapy mediated hepatitis treated with a taper dose of  prednisone and the patient is feeling much better and his liver enzymes have significantly improved. The patient was also recently diagnosed with congestive heart failure and he underwent aggressive diuresis as well as treatment with carvedilol.  He is scheduled to see his cardiologist soon for management of this condition. I had a lengthy discussion with the patient and his brother today about his condition and treatment options. I strongly recommend for the patient to take a break of treatment of the immunotherapy for the next few months until improvement of his general condition. I will arrange for him to have repeat CT scan of the chest, abdomen and pelvis before his next visit in 3 months. The patient was advised to call immediately if he has any concerning symptoms in the interval. The patient voices understanding of current disease status and treatment  options and is in agreement with the current care plan. All questions were answered. The patient knows to call the clinic with any problems, questions or concerns. We can certainly see the patient much sooner if necessary.  Disclaimer: This note was dictated with voice recognition software. Similar sounding words can inadvertently be transcribed and may not be corrected upon review.

## 2018-01-22 ENCOUNTER — Encounter: Payer: Self-pay | Admitting: Cardiovascular Disease

## 2018-01-22 ENCOUNTER — Ambulatory Visit (HOSPITAL_COMMUNITY): Admission: RE | Admit: 2018-01-22 | Payer: Medicare Other | Source: Ambulatory Visit

## 2018-01-22 ENCOUNTER — Ambulatory Visit: Payer: Medicare Other | Admitting: Cardiovascular Disease

## 2018-01-22 VITALS — BP 80/42 | HR 94 | Ht 71.0 in | Wt 173.0 lb

## 2018-01-22 DIAGNOSIS — I2510 Atherosclerotic heart disease of native coronary artery without angina pectoris: Secondary | ICD-10-CM | POA: Diagnosis not present

## 2018-01-22 DIAGNOSIS — I255 Ischemic cardiomyopathy: Secondary | ICD-10-CM | POA: Diagnosis not present

## 2018-01-22 DIAGNOSIS — I10 Essential (primary) hypertension: Secondary | ICD-10-CM

## 2018-01-22 DIAGNOSIS — Z79899 Other long term (current) drug therapy: Secondary | ICD-10-CM | POA: Diagnosis not present

## 2018-01-22 DIAGNOSIS — I5022 Chronic systolic (congestive) heart failure: Secondary | ICD-10-CM

## 2018-01-22 DIAGNOSIS — I739 Peripheral vascular disease, unspecified: Secondary | ICD-10-CM

## 2018-01-22 MED ORDER — CARVEDILOL 3.125 MG PO TABS: 3.1250 mg | ORAL_TABLET | Freq: Two times a day (BID) | ORAL | 3 refills | Status: DC

## 2018-01-22 NOTE — Patient Instructions (Signed)
Medication Instructions:  Your physician has recommended you make the following change in your medication:  1) INCREASE Lasix to 40 mg tablet by mouth ONCE daily for 3 days; Then start 20 mg tablet by my ONCE daily on Friday 11/8 2) DECREASE Coreg to 3.125mg  tablet by mouth TWICE daily  If you need a refill on your cardiac medications before your next appointment, please call your pharmacy.   Lab work: Your physician recommends that you return for lab work in: Monday 11/11 - BM[facility]  If you have labs (blood work) drawn today and your tests are completely normal, you will receive your results only by: Marland Kitchen MyChart Message (if you have MyChart) OR . A paper copy in the mail If you have any lab test that is abnormal or we need to change your treatment, we will call you to review the results.  Testing/Procedures: none  Follow-Up: At [facility], you and your health needs are our priority.  As part of our continuing mission to provide you with exceptional heart care, we have created designated Provider Care Teams.  These Care Teams include your primary Cardiologist (physician) and Advanced Practice Providers (APPs -  Physician Assistants and Nurse Practitioners) who all work together to provide you with the care you need, when you need it. You will need a follow up appointment in 3 months.  Please call our office 2 months in advance to schedule this appointment.  You may see [name], MD or one of the following Advanced Practice Providers on your designated Care Team:   Kerin Ransom, PA-C Roby Lofts, [place] . Sande Rives, PA-C  Any Other Special Instructions Will Be Listed Below (If Applicable).

## 2018-01-22 NOTE — Progress Notes (Signed)
Cardiology Office Note   Date:  01/22/2018   ID:  Trevor [name], DOB May 28, 1945, MRN [id number]  PCP:  [name], MD  Cardiologist:   [name], MD   Chief Complaint  Patient presents with  . Shortness of Breath    pt c/o difficulty breathing      History of Present Illness: Trevor [name] is a 72 y.o. male who presents for a followup visit regarding coronary artery disease status post  CABG,  ischemic cardiomyopathy and peripheral arterial disease.  He had CABG in April 2014 after presentation with an anterior ST elevation myocardial infarction.  He was hospitalized in August, 2018 with severe symptomatic anemia with hemoglobin of 5.6. He was also found to have mildly elevated troponin consistent with non-ST elevation myocardial infarction. The patient was transfused. He underwent cardiac catheterization which showed occluded SVG to RCA and SVG to OM. His LIMA to LAD was patent. Ejection fraction was 20-25%. No revascularization could be done with PCI given his symptomatic anemia and also the fact that he was found to have renal mass highly suggestive of cancer with pulmonary nodules. The patient was treated medically. He returned 2 days later with a stroke which was confirmed by MRI.  He was treated medically for coronary artery disease.  He had a repeat echocardiogram done in September, 2018 which showed an EF of 45-50% with mild mitral regurgitation.  Carotid Doppler showed moderate bilateral disease worse on the right side.  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   He is known to have peripheral arterial disease with moderately reduced ABI on the right due to severe SFA disease. He is being treated medically due to lack of claudication.  The patient was hospitalized last month with non-ST elevation myocardial infarction in the setting of metabolic abnormalities related to  hyperglycemia.  He underwent cardiac catheterization which showed no significant change from before with the exception of more obstructive disease in the ramus and a heavily calcified vessels.  No revascularization was advised.  He was readmitted few days later with heart failure.  He was discharged home on small dose furosemide. Echocardiogram showed an EF of 20 to 25%.  The patient reports worsening shortness of breath and orthopnea.  He had about 4 to 5 pounds weight gain with increased leg edema.  He could not sleep very well last night due to this.  He has been taking furosemide 20 mg daily as needed for weight gain.  He does have chronic kidney disease with most recent creatinine of 1.89.  He denies any chest pain.  He feels very weak and tired.  Past Medical History:  Diagnosis Date  . 3-vessel coronary artery disease   . Anemia    2 iron infusions  . Arthritis   . Carotid disease, bilateral (HCC)    moderate  . Chronic systolic heart failure (HCC)    EF 45%  . Coronary artery disease    06/2012:STEMI s/p CABG; 8/18 NSTEMI  . Diabetes mellitus without complication ([place])   . Diarrhea   . Dyspnea    mild  . History of blood transfusion   . Hyperlipidemia   . Ischemic cardiomyopathy    Ejection fraction of 35-40% initially, 45 - 50% on echo 2014, 53% by perfusion study 2016  . Mild mitral regurgitation   . Numbness    Right side since stroke  . OA (osteoarthritis)   .  PAD (peripheral artery disease) ([place])   . Pulmonary nodule   . Renal cell cancer, left ([place]) 12/2016   Associated w/ pulmonary nodules  . Stroke ([name]) 2018  . TIA (transient ischemic attack) 10/2016  . Weakness generalized   . Wears glasses     Past Surgical History:  Procedure Laterality Date  . CARDIOVASCULAR STRESS TEST    . CORONARY ARTERY BYPASS GRAFT N/A 07/05/2012   Procedure: CORONARY ARTERY BYPASS GRAFTING (CABG);  Surgeon: Ivin Poot, MD;  Location: [facility];  Service: Open Heart Surgery;   Laterality: N/A;  . INTRAOPERATIVE TRANSESOPHAGEAL ECHOCARDIOGRAM N/A 07/05/2012   Procedure: INTRAOPERATIVE TRANSESOPHAGEAL ECHOCARDIOGRAM;  Surgeon: Ivin Poot, MD;  Location: [facility];  Service: Open Heart Surgery;  Laterality: N/A;  . LEFT HEART CATH AND CORS/GRAFTS ANGIOGRAPHY N/A 11/14/2016   Procedure: LEFT HEART CATH AND CORS/GRAFTS ANGIOGRAPHY;  Surgeon: Belva Crome, MD;  Location: [facility];  Service: Cardiovascular;  Laterality: N/A;  . LEFT HEART CATH AND CORS/GRAFTS ANGIOGRAPHY N/A 01/08/2018   Procedure: LEFT HEART CATH AND CORS/GRAFTS ANGIOGRAPHY;  Surgeon: Martinique, Peter M, MD;  Location: [facility];  Service: Cardiovascular;  Laterality: N/A;  . LEFT HEART CATHETERIZATION WITH CORONARY ANGIOGRAM N/A 07/04/2012   Procedure: LEFT HEART CATHETERIZATION WITH CORONARY ANGIOGRAM;  Surgeon: Wellington Hampshire, MD;  Location: [facility];  Service: Cardiovascular;  Laterality: N/A;  . RENAL BIOPSY    . ROBOT ASSISTED LAPAROSCOPIC NEPHRECTOMY Left 02/21/2017   Procedure: XI ROBOTIC ASSISTED LAPAROSCOPIC NEPHRECTOMY;  Surgeon: Alexis Frock, MD;  Location: [facility];  Service: Urology;  Laterality: Left;     Current Outpatient Medications  Medication Sig Dispense Refill  . aspirin EC 81 MG tablet Take 81 mg by mouth daily.    Marland Kitchen atorvastatin (LIPITOR) 40 MG tablet Take 1 tablet (40 mg total) by mouth daily. 30 tablet 6  . carvedilol (COREG) 6.25 MG tablet Take 1 tablet (6.25 mg total) by mouth 2 (two) times daily. 180 tablet 1  . clopidogrel (PLAVIX) 75 MG tablet Take 1 tablet (75 mg total) by mouth daily. 30 tablet 0  . ferrous sulfate 325 (65 FE) MG tablet Take 325 mg by mouth daily with breakfast.    . furosemide (LASIX) 20 MG tablet Take 1 tablet (20 mg total) by mouth daily as needed. 30 tablet 11  . isosorbide mononitrate (IMDUR) 30 MG 24 hr tablet Take 1 tablet (30 mg total) by mouth daily. 30 tablet 0  . ondansetron (ZOFRAN ODT) 4 MG disintegrating tablet Take 1  tablet (4 mg total) by mouth every 8 (eight) hours as needed for nausea or vomiting. 20 tablet 0  . predniSONE (DELTASONE) 50 MG tablet 4 tablet p.o. daily x1 week, followed by 3 tablets p.o. daily x1 week, followed by 2 tablets p.o. daily x1 week, then 1 tablet p.o. daily x1 week. 75 tablet 0  . sitaGLIPtin (JANUVIA) 50 MG tablet Take 1 tablet (50 mg total) by mouth daily. 30 tablet 0   No current facility-administered medications for this visit.     Allergies:   Patient has no known allergies.    Social History:  The patient  reports that he quit smoking about 5 years ago. He has a 50.00 pack-year smoking history. He has never used smokeless tobacco. He reports that he does not drink alcohol or use drugs.   Family History:  The patient's family history includes Cancer in his maternal grandfather and mother; Heart attack (age of onset:  18) in his brother; Stroke in his mother.    ROS:  Please see the history of present illness.   Otherwise, review of systems are positive for none.   All other systems are reviewed and negative.    PHYSICAL EXAM: VS:  BP (!) 80/42   Pulse 94   Ht 5\' 11"  (1.803 m)   Wt 173 lb (78.5 kg)   BMI 24.13 kg/m  , BMI Body mass index is 24.13 kg/m. GEN: Well nourished, well developed, in no acute distress  HEENT: normal  Neck: mild JVD, or masses. Bilateral carotid bruits Cardiac: RRR; no  rubs, or gallops.  2 out of 6 holosystolic murmur at the left sternal border.  Moderate bilateral leg edema Respiratory:  clear to auscultation bilaterally, normal work of breathing GI: soft, nontender, nondistended, + BS MS: no deformity or atrophy  Skin: warm and dry, no rash Neuro:  Strength and sensation are intact Psych: euthymic mood, full affect   EKG:  EKG is ordered today. EKG showed sinus rhythm with PACs, possible left atrial enlargement, anterolateral and inferior ST changes suggestive of ischemia.   Recent Labs: 12/27/2017: TSH 0.425 01/13/2018: B  Natriuretic Peptide 3,924.3 01/16/2018: ALT 98; BUN 24; Creatinine 1.89; Hemoglobin 9.7; Platelet Count 187; Potassium 4.1; Sodium 139    Lipid Panel    Component Value Date/Time   CHOL 77 11/20/2016 0704   TRIG 64 11/20/2016 0704   HDL 30 (L) 11/20/2016 0704   CHOLHDL 2.6 11/20/2016 0704   VLDL 13 11/20/2016 0704   LDLCALC 34 11/20/2016 0704      Wt Readings from Last 3 Encounters:  01/22/18 173 lb (78.5 kg)  01/16/18 171 lb 3.2 oz (77.7 kg)  01/15/18 167 lb 8 oz (76 kg)         ASSESSMENT AND PLAN:   1.  Coronary artery disease: Status post CABG. occluded vein graft with only patent LIMA to LAD.  Heavily calcified vessels not optimal for PCI.  Continue medical therapy.  2.  Chronic systolic heart failure: Due to ischemic cardiomyopathy.  Currently [place] Heart Association class III.   The patient appears to be significantly volume overloaded.  I elected to increase furosemide to 40 mg daily for 3 days then back to 20 mg daily.  Check basic metabolic profile on Monday. Due to low blood pressure, I decreased carvedilol to 3.25 mg twice daily.  No ACE inhibitor or ARB due to low blood pressure and advanced chronic kidney disease. Overall prognosis is poor.  3. Peripheral arterial disease: The patient has evidence of severe right mid SFA stenosis .  The patient reports no claudication at the present time  4. Hyperlipidemia: Continue treatment with atorvastatin with a target LDL of less than 70.  5. Moderate bilateral carotid disease: Given his comorbidities, we should repeat carotid Doppler only if he is symptomatic.   Disposition: Follow-up with me in 3 months.  Signed,  [name], MD  01/22/2018 8:14 AM    [place]

## 2018-01-24 ENCOUNTER — Inpatient Hospital Stay (HOSPITAL_COMMUNITY)
Admission: EM | Admit: 2018-01-24 | Discharge: 2018-01-27 | DRG: 291 | Disposition: A | Discharge status: Home / Self Care | Payer: Medicare Other | Attending: Interventional Cardiology | Admitting: Interventional Cardiology

## 2018-01-24 ENCOUNTER — Emergency Department (HOSPITAL_COMMUNITY): Payer: Medicare Other

## 2018-01-24 ENCOUNTER — Encounter (HOSPITAL_COMMUNITY): Payer: Self-pay | Admitting: Emergency Medicine

## 2018-01-24 ENCOUNTER — Inpatient Hospital Stay: Payer: Medicare Other

## 2018-01-24 ENCOUNTER — Inpatient Hospital Stay: Payer: Medicare Other | Admitting: Internal Medicine

## 2018-01-24 ENCOUNTER — Other Ambulatory Visit: Payer: Self-pay

## 2018-01-24 DIAGNOSIS — I5023 Acute on chronic systolic (congestive) heart failure: Secondary | ICD-10-CM

## 2018-01-24 DIAGNOSIS — I472 Ventricular tachycardia: Secondary | ICD-10-CM | POA: Diagnosis not present

## 2018-01-24 DIAGNOSIS — D649 Anemia, unspecified: Secondary | ICD-10-CM | POA: Diagnosis present

## 2018-01-24 DIAGNOSIS — M1990 Unspecified osteoarthritis, unspecified site: Secondary | ICD-10-CM | POA: Diagnosis present

## 2018-01-24 DIAGNOSIS — I255 Ischemic cardiomyopathy: Secondary | ICD-10-CM | POA: Diagnosis present

## 2018-01-24 DIAGNOSIS — R600 Localized edema: Secondary | ICD-10-CM | POA: Diagnosis not present

## 2018-01-24 DIAGNOSIS — Z905 Acquired absence of kidney: Secondary | ICD-10-CM

## 2018-01-24 DIAGNOSIS — E785 Hyperlipidemia, unspecified: Secondary | ICD-10-CM | POA: Diagnosis present

## 2018-01-24 DIAGNOSIS — I340 Nonrheumatic mitral (valve) insufficiency: Secondary | ICD-10-CM | POA: Diagnosis present

## 2018-01-24 DIAGNOSIS — Z87891 Personal history of nicotine dependence: Secondary | ICD-10-CM

## 2018-01-24 DIAGNOSIS — I252 Old myocardial infarction: Secondary | ICD-10-CM

## 2018-01-24 DIAGNOSIS — I5043 Acute on chronic combined systolic (congestive) and diastolic (congestive) heart failure: Secondary | ICD-10-CM | POA: Diagnosis present

## 2018-01-24 DIAGNOSIS — Z79899 Other long term (current) drug therapy: Secondary | ICD-10-CM

## 2018-01-24 DIAGNOSIS — I2510 Atherosclerotic heart disease of native coronary artery without angina pectoris: Secondary | ICD-10-CM | POA: Diagnosis present

## 2018-01-24 DIAGNOSIS — Z7952 Long term (current) use of systemic steroids: Secondary | ICD-10-CM

## 2018-01-24 DIAGNOSIS — I130 Hypertensive heart and chronic kidney disease with heart failure and stage 1 through stage 4 chronic kidney disease, or unspecified chronic kidney disease: Secondary | ICD-10-CM | POA: Diagnosis not present

## 2018-01-24 DIAGNOSIS — R296 Repeated falls: Secondary | ICD-10-CM | POA: Diagnosis present

## 2018-01-24 DIAGNOSIS — Z951 Presence of aortocoronary bypass graft: Secondary | ICD-10-CM

## 2018-01-24 DIAGNOSIS — N184 Chronic kidney disease, stage 4 (severe): Secondary | ICD-10-CM | POA: Diagnosis present

## 2018-01-24 DIAGNOSIS — I25118 Atherosclerotic heart disease of native coronary artery with other forms of angina pectoris: Secondary | ICD-10-CM | POA: Diagnosis not present

## 2018-01-24 DIAGNOSIS — I509 Heart failure, unspecified: Secondary | ICD-10-CM

## 2018-01-24 DIAGNOSIS — I779 Disorder of arteries and arterioles, unspecified: Secondary | ICD-10-CM | POA: Diagnosis present

## 2018-01-24 DIAGNOSIS — Z85528 Personal history of other malignant neoplasm of kidney: Secondary | ICD-10-CM

## 2018-01-24 DIAGNOSIS — Z8673 Personal history of transient ischemic attack (TIA), and cerebral infarction without residual deficits: Secondary | ICD-10-CM

## 2018-01-24 DIAGNOSIS — Z66 Do not resuscitate: Secondary | ICD-10-CM | POA: Diagnosis not present

## 2018-01-24 DIAGNOSIS — Z8249 Family history of ischemic heart disease and other diseases of the circulatory system: Secondary | ICD-10-CM

## 2018-01-24 DIAGNOSIS — I5021 Acute systolic (congestive) heart failure: Secondary | ICD-10-CM | POA: Diagnosis present

## 2018-01-24 DIAGNOSIS — E1151 Type 2 diabetes mellitus with diabetic peripheral angiopathy without gangrene: Secondary | ICD-10-CM | POA: Diagnosis present

## 2018-01-24 DIAGNOSIS — E1122 Type 2 diabetes mellitus with diabetic chronic kidney disease: Secondary | ICD-10-CM | POA: Diagnosis present

## 2018-01-24 LAB — CBC WITH DIFFERENTIAL/PLATELET
ABS IMMATURE GRANULOCYTES: 0.04 10*3/uL (ref 0.00–0.07)
BASOS ABS: 0 10*3/uL (ref 0.0–0.1)
Basophils Relative: 0 %
EOS PCT: 3 %
Eosinophils Absolute: 0.3 10*3/uL (ref 0.0–0.5)
HEMATOCRIT: 28.1 % — AB (ref 39.0–52.0)
HEMOGLOBIN: 8.5 g/dL — AB (ref 13.0–17.0)
IMMATURE GRANULOCYTES: 0 %
LYMPHS ABS: 1.1 10*3/uL (ref 0.7–4.0)
LYMPHS PCT: 11 %
MCH: 26.9 pg (ref 26.0–34.0)
MCHC: 30.2 g/dL (ref 30.0–36.0)
MCV: 88.9 fL (ref 80.0–100.0)
Monocytes Absolute: 1.1 10*3/uL — ABNORMAL HIGH (ref 0.1–1.0)
Monocytes Relative: 11 %
NRBC: 0 % (ref 0.0–0.2)
Neutro Abs: 7.3 10*3/uL (ref 1.7–7.7)
Neutrophils Relative %: 75 %
Platelets: 280 10*3/uL (ref 150–400)
RBC: 3.16 MIL/uL — ABNORMAL LOW (ref 4.22–5.81)
RDW: 19.8 % — ABNORMAL HIGH (ref 11.5–15.5)
WBC: 9.8 10*3/uL (ref 4.0–10.5)

## 2018-01-24 LAB — GLUCOSE, CAPILLARY: Glucose-Capillary: 188 mg/dL — ABNORMAL HIGH (ref 70–99)

## 2018-01-24 LAB — COMPREHENSIVE METABOLIC PANEL
ALBUMIN: 2.7 g/dL — AB (ref 3.5–5.0)
ALK PHOS: 306 U/L — AB (ref 38–126)
ALT: 67 U/L — ABNORMAL HIGH (ref 0–44)
ANION GAP: 12 (ref 5–15)
AST: 49 U/L — ABNORMAL HIGH (ref 15–41)
BUN: 26 mg/dL — ABNORMAL HIGH (ref 8–23)
CALCIUM: 8.4 mg/dL — AB (ref 8.9–10.3)
CO2: 25 mmol/L (ref 22–32)
Chloride: 98 mmol/L (ref 98–111)
Creatinine, Ser: 1.8 mg/dL — ABNORMAL HIGH (ref 0.61–1.24)
GFR calc Af Amer: 42 mL/min — ABNORMAL LOW (ref >60)
GFR calc non Af Amer: 36 mL/min — ABNORMAL LOW (ref >60)
GLUCOSE: 163 mg/dL — AB (ref 70–99)
Potassium: 3.7 mmol/L (ref 3.5–5.1)
SODIUM: 135 mmol/L (ref 135–145)
Total Bilirubin: 1.1 mg/dL (ref 0.3–1.2)
Total Protein: 5.7 g/dL — ABNORMAL LOW (ref 6.5–8.1)

## 2018-01-24 LAB — I-STAT TROPONIN, ED: TROPONIN I, POC: 0.27 ng/mL — AB (ref 0.00–0.08)

## 2018-01-24 LAB — BRAIN NATRIURETIC PEPTIDE: B Natriuretic Peptide: 2665.9 pg/mL — ABNORMAL HIGH (ref 0.0–100.0)

## 2018-01-24 MED ORDER — FERROUS SULFATE 325 (65 FE) MG PO TABS
325.0000 mg | ORAL_TABLET | Freq: Every day | ORAL | Status: DC
  Administered 2018-01-25 – 2018-01-27 (×3): 325 mg via ORAL
  Filled 2018-01-24 (×3): qty 1

## 2018-01-24 MED ORDER — HEPARIN SODIUM (PORCINE) 5000 UNIT/ML IJ SOLN
5000.0000 [IU] | Freq: Three times a day (TID) | INTRAMUSCULAR | Status: DC
  Administered 2018-01-24 – 2018-01-27 (×8): 5000 [IU] via SUBCUTANEOUS
  Filled 2018-01-24 (×7): qty 1

## 2018-01-24 MED ORDER — FUROSEMIDE 10 MG/ML IJ SOLN
20.0000 mg | Freq: Once | INTRAMUSCULAR | Status: AC
  Administered 2018-01-24: 20 mg via INTRAVENOUS
  Filled 2018-01-24: qty 2

## 2018-01-24 MED ORDER — ASPIRIN EC 81 MG PO TBEC
81.0000 mg | DELAYED_RELEASE_TABLET | Freq: Every day | ORAL | Status: DC
  Administered 2018-01-25 – 2018-01-27 (×3): 81 mg via ORAL
  Filled 2018-01-24 (×3): qty 1

## 2018-01-24 MED ORDER — ACETAMINOPHEN 325 MG PO TABS: 650.0000 mg | ORAL_TABLET | ORAL | Status: DC | PRN

## 2018-01-24 MED ORDER — FUROSEMIDE 10 MG/ML IJ SOLN
40.0000 mg | Freq: Once | INTRAMUSCULAR | Status: AC
  Administered 2018-01-24: 40 mg via INTRAVENOUS
  Filled 2018-01-24: qty 4

## 2018-01-24 MED ORDER — ATORVASTATIN CALCIUM 40 MG PO TABS
40.0000 mg | ORAL_TABLET | Freq: Every day | ORAL | Status: DC
  Administered 2018-01-24 – 2018-01-26 (×3): 40 mg via ORAL
  Filled 2018-01-24 (×4): qty 1

## 2018-01-24 MED ORDER — NITROGLYCERIN 0.4 MG SL SUBL: 0.4000 mg | SUBLINGUAL_TABLET | SUBLINGUAL | Status: DC | PRN

## 2018-01-24 MED ORDER — FUROSEMIDE 10 MG/ML IJ SOLN: 40.0000 mg | Freq: Once | INTRAMUSCULAR | Status: DC

## 2018-01-24 MED ORDER — CLOPIDOGREL BISULFATE 75 MG PO TABS
75.0000 mg | ORAL_TABLET | Freq: Every day | ORAL | Status: DC
  Administered 2018-01-25 – 2018-01-27 (×3): 75 mg via ORAL
  Filled 2018-01-24 (×4): qty 1

## 2018-01-24 MED ORDER — FUROSEMIDE 10 MG/ML IJ SOLN
60.0000 mg | Freq: Two times a day (BID) | INTRAMUSCULAR | Status: DC
  Administered 2018-01-25 – 2018-01-26 (×3): 60 mg via INTRAVENOUS
  Filled 2018-01-24 (×3): qty 6

## 2018-01-24 MED ORDER — ONDANSETRON HCL 4 MG/2ML IJ SOLN: 4.0000 mg | Freq: Four times a day (QID) | INTRAMUSCULAR | Status: DC | PRN

## 2018-01-24 MED ORDER — INSULIN ASPART 100 UNIT/ML ~~LOC~~ SOLN
0.0000 [IU] | Freq: Three times a day (TID) | SUBCUTANEOUS | Status: DC
  Administered 2018-01-25: 8 [IU] via SUBCUTANEOUS
  Administered 2018-01-25: 3 [IU] via SUBCUTANEOUS
  Administered 2018-01-26: 8 [IU] via SUBCUTANEOUS
  Administered 2018-01-26: 2 [IU] via SUBCUTANEOUS
  Administered 2018-01-26: 3 [IU] via SUBCUTANEOUS
  Administered 2018-01-27: 8 [IU] via SUBCUTANEOUS

## 2018-01-24 MED ORDER — INSULIN ASPART 100 UNIT/ML ~~LOC~~ SOLN: 0.0000 [IU] | Freq: Every day | SUBCUTANEOUS | Status: DC

## 2018-01-24 NOTE — H&P (Addendum)
Cardiology Admission History and Physical:   Patient ID: Trevor [name] MRN: [id number]; DOB: January 17, 1946   Admission date: 01/24/2018  Primary Care Provider: [name], MD Primary Cardiologist: [name], MD  Primary Electrophysiologist:  None   Chief Complaint:  Chest Pain  Patient Profile:   Trevor [name] is a 72 y.o. 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  History of Present Illness:   Trevor [name] is a CAD with anterior STEMI in 2014 s/p CABG, ischemic cardiomyopathy, PAD, and stroke in 2018 who is followed by Dr. Fletcher Anon. Patient admitted from 01/07/2018 to 01/09/2018 after presenting with acute chest pain with sensation of heaviness extending to bilateral arms. EKG at that time showed significant ST abnormalities. Patient was found to have DKA. Troponin trended up to 45. Patient underwent left heart catheterization which showed critical 3-vessel obstructive CAD with heavily calcified vessels, patent LIMA to LAD, known occlusion of SVGs, and moderately elevated LVEDP. Compared to prior study in 10/2016, there was progression of disease involving the ostium of the ramus intermediate. There were no good PCI options and patient is not a candidate for CABG so medical management and palliative care were recommended. Echo at that time showed LVEF of 20-25% (down from 45-50% in 11/2016). Patient was admitted again from 01/13/2018 to 01/16/2018 after presenting with shortness of breath. He was treated for acute on chronic combined CHF with IV Lasix and discharged in stable condition. Patient last saw Dr. Fletcher Anon on 01/22/2018 for follow-up at which time he reported worsening shortness of breath, orthopnea, and leg edema with about a 4-5 pound weight gain. He  had not been taking Lasix 20mg  as needed for weight gain. Patient was advised to increase Lasix to 40mg  daily for 3 days and then go back to 20mg  daily. Coreg was also decreased to 3.25mg  twice daily due to low blood pressure.  Patient presents to the ED today via EMS for evaluation of worsening shortness of breath and orthopnea. Patient states the increase Lasix helped his lower leg edema but did not do much for his breathing. He reports significant orthopnea the last 2 nights to the point where he basically had to sit up all night because he could not breath when laying flat. He denies any chest pain but does report some chest tightness with the the build up of fluid. He denies any palpitations, lightheadedness, or dizziness. He states it feels like he is having another heart failure exacerbation. He states he is compliant with all of his medications. He also states he watches his salt intake; however, I suspect that he is getting more than he realizes as he reports recently eating luncheon meets and a baked potato from [facility].  Upon arrival to the ED, patient tachycardic at 104 mmHg and borderline hypotensive at 91/68 mmHg. EKG showed sinus tachycardia, rate 105 bpm, with multiple ST abnormalities but no significant changes compared to prior tracing on 01/13/2018. I-stat troponin elevated at 0.27. Chest x-ray showed perihilar infiltrates (right > left) that favor pulmonary edema. CBC, CMP, and BNP pending. Patient received IV Lasix 20mg  in the ED.    Past Medical History:  Diagnosis Date  . 3-vessel coronary artery disease   . Anemia    2 iron infusions  . Arthritis   .  Carotid disease, bilateral (HCC)    moderate  . Chronic systolic heart failure (HCC)    EF 45%  . Coronary artery disease    06/2012:STEMI s/p CABG; 8/18 NSTEMI  . Diabetes mellitus without complication ([place])   . Diarrhea   . Dyspnea    mild  . History of blood transfusion   . Hyperlipidemia   . Ischemic cardiomyopathy      Ejection fraction of 35-40% initially, 45 - 50% on echo 2014, 53% by perfusion study 2016  . Mild mitral regurgitation   . Numbness    Right side since stroke  . OA (osteoarthritis)   . PAD (peripheral artery disease) ([place])   . Pulmonary nodule   . Renal cell cancer, left ([place]) 12/2016   Associated w/ pulmonary nodules  . Stroke ([place]) 2018  . TIA (transient ischemic attack) 10/2016  . Weakness generalized   . Wears glasses     Past Surgical History:  Procedure Laterality Date  . CARDIOVASCULAR STRESS TEST    . CORONARY ARTERY BYPASS GRAFT N/A 07/05/2012   Procedure: CORONARY ARTERY BYPASS GRAFTING (CABG);  Surgeon: Ivin Poot, MD;  Location: [place];  Service: Open Heart Surgery;  Laterality: N/A;  . INTRAOPERATIVE TRANSESOPHAGEAL ECHOCARDIOGRAM N/A 07/05/2012   Procedure: INTRAOPERATIVE TRANSESOPHAGEAL ECHOCARDIOGRAM;  Surgeon: Ivin Poot, MD;  Location: [place];  Service: Open Heart Surgery;  Laterality: N/A;  . LEFT HEART CATH AND CORS/GRAFTS ANGIOGRAPHY N/A 11/14/2016   Procedure: LEFT HEART CATH AND CORS/GRAFTS ANGIOGRAPHY;  Surgeon: Belva Crome, MD;  Location: [place] [facility];  Service: Cardiovascular;  Laterality: N/A;  . LEFT HEART CATH AND CORS/GRAFTS ANGIOGRAPHY N/A 01/08/2018   Procedure: LEFT HEART CATH AND CORS/GRAFTS ANGIOGRAPHY;  Surgeon: Martinique, Peter M, MD;  Location: [place];  Service: Cardiovascular;  Laterality: N/A;  . LEFT HEART CATHETERIZATION WITH CORONARY ANGIOGRAM N/A 07/04/2012   Procedure: LEFT HEART CATHETERIZATION WITH CORONARY ANGIOGRAM;  Surgeon: Wellington Hampshire, MD;  Location: [facility];  Service: Cardiovascular;  Laterality: N/A;  . RENAL BIOPSY    . ROBOT ASSISTED LAPAROSCOPIC NEPHRECTOMY Left 02/21/2017   Procedure: XI ROBOTIC ASSISTED LAPAROSCOPIC NEPHRECTOMY;  Surgeon: Alexis Frock, MD;  Location: [facility];  Service: Urology;  Laterality: Left;     Medications Prior to Admission: Prior to Admission medications    Medication Sig Start Date End Date Taking? Authorizing Provider  aspirin EC 81 MG tablet Take 81 mg by mouth daily.    [provider]  atorvastatin (LIPITOR) 40 MG tablet Take 1 tablet (40 mg total) by mouth daily. 05/20/13   Wellington Hampshire, MD  carvedilol (COREG) 3.125 MG tablet Take 1 tablet (3.125 mg total) by mouth 2 (two) times daily with a meal. 01/22/18   Wellington Hampshire, MD  clopidogrel (PLAVIX) 75 MG tablet Take 1 tablet (75 mg total) by mouth daily. 01/10/18 02/09/18  Donne Hazel, MD  ferrous sulfate 325 (65 FE) MG tablet Take 325 mg by mouth daily with breakfast.    [provider]  furosemide (LASIX) 20 MG tablet Take 1 tablet (20 mg total) by mouth daily as needed. 01/15/18 01/15/19  Elodia Florence., MD  isosorbide mononitrate (IMDUR) 30 MG 24 hr tablet Take 1 tablet (30 mg total) by mouth daily. 01/10/18 02/09/18  Donne Hazel, MD  ondansetron (ZOFRAN ODT) 4 MG disintegrating tablet Take 1 tablet (4 mg total) by mouth every 8 (eight) hours as needed for nausea or vomiting. 04/20/17  Rai, Ripudeep K, MD  predniSONE (DELTASONE) 50 MG tablet 4 tablet p.o. daily x1 week, followed by 3 tablets p.o. daily x1 week, followed by 2 tablets p.o. daily x1 week, then 1 tablet p.o. daily x1 week. 12/27/17   Curt Bears, MD  sitaGLIPtin (JANUVIA) 50 MG tablet Take 1 tablet (50 mg total) by mouth daily. 01/09/18 02/08/18  Donne Hazel, MD     Allergies:   No Known Allergies  Social History:   Social History   Socioeconomic History  . Marital status: Widowed    Spouse name: Not on file  . Number of children: Not on file  . Years of education: Not on file  . Highest education level: Not on file  Occupational History  . Occupation: Retired Fish farm manager  . Financial resource strain: Not on file  . Food insecurity:    Worry: Not on file    Inability: Not on file  . Transportation needs:    Medical: Not on file    Non-medical: Not on  file  Tobacco Use  . Smoking status: Former Smoker    Packs/day: 1.00    Years: 50.00    Pack years: 50.00    Last attempt to quit: 04/20/2012    Years since quitting: 5.7  . Smokeless tobacco: Never Used  Substance and Sexual Activity  . Alcohol use: No  . Drug use: No  . Sexual activity: Not on file    Comment: retired, lives with sister and husband  Lifestyle  . Physical activity:    Days per week: Not on file    Minutes per session: Not on file  . Stress: Not on file  Relationships  . Social connections:    Talks on phone: Not on file    Gets together: Not on file    Attends religious service: Not on file    Active member of club or organization: Not on file    Attends meetings of clubs or organizations: Not on file    Relationship status: Not on file  . Intimate partner violence:    Fear of current or ex partner: Not on file    Emotionally abused: Not on file    Physically abused: Not on file    Forced sexual activity: Not on file  Other Topics Concern  . Not on file  Social History Narrative   Lives with sister.    Family History:   The patient's family history includes CAD in his brother; Cancer in his maternal grandfather and mother; Heart attack (age of onset: 77) in his brother; Stroke in his mother.    ROS:  Please see the history of present illness.  Review of Systems  Constitutional: Positive for malaise/fatigue. Negative for chills and fever.  HENT: Negative for congestion.   Eyes: Negative for blurred vision and double vision.  Respiratory: Positive for cough and shortness of breath. Negative for hemoptysis and sputum production.   Cardiovascular: Positive for orthopnea, leg swelling and PND. Negative for chest pain.  Gastrointestinal: Positive for constipation. Negative for abdominal pain, blood in stool, nausea and vomiting.  Genitourinary: Negative for hematuria.  Musculoskeletal: Negative for myalgias.  Neurological: Positive for tingling (right  side from prior stroke) and weakness. Negative for dizziness and loss of consciousness.  Endo/Heme/Allergies: Does not bruise/bleed easily.  Psychiatric/Behavioral: Negative for substance abuse.     Physical Exam/Data:   Vitals:   01/24/18 1700 01/24/18 1730 01/24/18 1745 01/24/18 1800  BP: (!) 81/49 Marland Kitchen)  85/59 (!) 98/53 (!) 83/45  Pulse: (!) 104 96 96 (!) 108  Resp: 15 14 17 19   Temp:      TempSrc:      SpO2: 97% 100% 100% 100%  Weight:      Height:       No intake or output data in the 24 hours ending 01/24/18 1901 Filed Weights   01/24/18 1645  Weight: 76.2 kg   Body mass index is 23.43 kg/m.  General:  72 year old Caucasian male resting comfortably in no acute distress. Pleasant and cooperative. HEENT: Head normocephalic and atraumatic. Scleral clear with no signs of icterus.  Neck: Supple. Mild JVD. Endocrine:  No thyromegaly appreciated. Vascular: No carotid bruits. Radial pulses 2+ and equal bilaterally. Cardiac:  RRR. No murmurs, gallops, or rubs. Lungs: No increased work of breathing. Regular respiratory rate. Few crackles in bilateral bases but lungs relatively clear. Abd: Abdomen soft, non-distended, and non-tender to palpation. Bowel sounds present.  Ext: 2-3+ pitting edema of bilateral lower extremities (right > left). Musculoskeletal:  No deformities. BUE and BLE strength normal and equal for age. Skin: Warm and dry. Neuro:  No focal abnormalities noted. Psych:  Normal affect.    EKG:  The ECG that was done was personally reviewed and demonstrates sinus tachycardia, rate 105 bpm, with ST depression in inferior and anterolateral leads but no significant changes from prior tracing in 12/2017.   Relevant CV Studies:  Left Heart Catheterization 01/08/2018:  Ost LAD to Prox LAD lesion is 100% stenosed.  Ost Ramus lesion is 99% stenosed.  Mid LM to Dist LM lesion is 95% stenosed with 95% stenosed side branch in Ost Cx to Prox Cx.  Ost 1st Mrg to 1st Mrg  lesion is 90% stenosed.  Prox Cx to Mid Cx lesion is 60% stenosed.  Prox RCA lesion is 85% stenosed.  RPDA lesion is 90% stenosed.  Post Atrio lesion is 90% stenosed.  LIMA graft was visualized by angiography and is normal in caliber.  The graft exhibits no disease.  SVG graft was not injected.  Origin to Prox Graft lesion is 100% stenosed.  SVG graft was not injected.  Origin to Prox Graft lesion is 100% stenosed.  LV end diastolic pressure is moderately elevated.   1. Critical 3 vessel obstructive CAD- vessels are heavily calcified.     - 95% distal left main     - 100% proximal LAD    - 99% ostial ramus intermediate     - 95% ostial LCx    - 90% OM1    - 85% segmental proximal RCA    - 90% PDA    - 90% PLOM 2. Patent LIMA to the LAD 3. Known occlusion of SVGs 4. Moderately elevated LVEDP  Plan: compared to prior study in August 2018 there is progression of disease involving the ostium of the ramus intermediate. Otherwise no change. There are no good options for PCI and patient is not a candidate for CABG due to comorbid conditions. Would recommend correction of metabolic derangements (DKA) and correct anemia. I don't think he is a candidate for any coronary intervention and would recommend medical management and palliative care.  _______________  Echocardiogram 01/08/2018: Study Conclusions: - Left ventricle: The cavity size was mildly dilated. Wall   thickness was normal. Systolic function was severely reduced. The   estimated ejection fraction was in the range of 20% to 25%.   Severe diffuse hypokinesis with no identifiable regional   variations.  Dyskinesis and scarring of the basal-mid anteroseptal   myocardium and relatively preserved apical contraction;   consistent with infarction in the distribution of the proximal   left anterior descending coronary artery with patent bypass to   the distal LAD. There was fusion of early and atrial   contributions to  ventricular filling. Features are consistent   with a pseudonormal left ventricular filling pattern, with   concomitant abnormal relaxation and increased filling pressure   (grade 2 diastolic dysfunction). Acoustic contrast opacification   revealed no evidence ofthrombus. - Ventricular septum: Septal motion showed paradox. - Mitral valve: There was mild to moderate regurgitation directed   centrally. - Left atrium: The atrium was severely dilated. - Right ventricle: Systolic function was moderately reduced. - Right atrium: The atrium was mildly dilated. - Pulmonary arteries: PA peak pressure: 33 mm Hg (S).   Laboratory Data:  Chemistry Recent Labs  Lab 01/24/18 1647  NA 135  K 3.7  CL 98  CO2 25  GLUCOSE 163*  BUN 26*  CREATININE 1.80*  CALCIUM 8.4*  GFRNONAA 36*  GFRAA 42*  ANIONGAP 12    Recent Labs  Lab 01/24/18 1647  PROT 5.7*  ALBUMIN 2.7*  AST 49*  ALT 67*  ALKPHOS 306*  BILITOT 1.1   Hematology Recent Labs  Lab 01/24/18 1647  WBC 9.8  RBC 3.16*  HGB 8.5*  HCT 28.1*  MCV 88.9  MCH 26.9  MCHC 30.2  RDW 19.8*  PLT 280   Cardiac EnzymesNo results for input(s): TROPONINI in the last 168 hours.  Recent Labs  Lab 01/24/18 1646  TROPIPOC 0.27*    BNP Recent Labs  Lab 01/24/18 1647  BNP 2,665.9*    DDimer No results for input(s): DDIMER in the last 168 hours.  Radiology/Studies:  Dg Chest Portable 1 View  Result Date: 01/24/2018 CLINICAL DATA:  SOB over the past several weeks, becoming much more severe over the past 2 days. Pt states laying supine is unbearable and he can't breathe at all. Pt has been trying to sleep sitting upright. EXAM: PORTABLE CHEST 1 VIEW COMPARISON:  01/13/2018 FINDINGS: Median sternotomy and CABG. The heart is enlarged. There is perihilar opacity bilaterally, RIGHT greater than LEFT. Considerations include pulmonary edema and/or infectious process. No definite pleural effusions. IMPRESSION: Perihilar infiltrates, RIGHT  greater than LEFT. Favor pulmonary edema. Infectious infiltrates could have a similar appearance. Electronically Signed   By: Nolon Nations M.D.   On: 01/24/2018 17:26    Assessment and Plan:   1. Acute on Chronic Combined Systolic and Diastolic Heart Failure - Patient presents with worsening shortness of breath, orthopnea, and lower extremity swelling.  - EKG showed ST abnormalities but no significant changes form prior tracing last month. - Chest x-ray showed perihilar infiltrate (right > left) that favor pulmonary edema. - BNP elevated at 2,665.9 (3,924.3 on 01/13/2018 during last admission). - Patient does have 2+ pitting edema of lower extremities but lungs sound relatively clear.  - Recommend gentle diuresis with IV Lasix. Patient has received IV Lasix 20mg  in the ED. Per MD, will give another 40mg  tonight and then start 60mg  twice daily tomorrow. Will need to closely monitor BP as systolic BP as been in the 80's-90's (normal for him lately). - Will hold home Coreg and Imdur due to BP. - No ACE/ARB given low blood pressure and renal function. - Monitor daily weights and strict I's and O's.  - Monitor renal function daily. - Cardiology will admit. Consider consulting advanced  heart failure team. Palliative care would also be helpful. Patient told Dr. Irish Lack that he was DNR. Will put in DNR order.   2. CAD  - Patient s/p CABG in 2014. Occluded vein graft with only patent graft being LIMA to LAD.  - Recent cath on 01/08/2018 showed critical 3 vessel obstructive CAD with heavily calcified vessels. No good PCI options and patient is not a candidate for a CABG. - I-stat troponin minimally elevated at 0.27.  - Continue aspirin, Plavix, and high-intensity statin. - Will hold Coreg due to BP.  3. Hyperlipidemia - Continue Lipitor 40mg  daily.  4.  Moderate Bilateral Carotid Disease - Per Dr. Tyrell Antonio last office visit note, we should repeat Doppler only if he is symptomatic given his  comorbidities.  5. Stage IV Renal Cell Carcinoma s/p Nephrectomy / Renal Insufficiency - SCr 1.80 which appears to be his baseline. - Continue to monitor renal function daily.  Severity of Illness: The appropriate patient status for this patient is OBSERVATION. Observation status is judged to be reasonable and necessary in order to provide the required intensity of service to ensure the patient's safety. The patient's presenting symptoms, physical exam findings, and initial radiographic and laboratory data in the context of their medical condition is felt to place them at decreased risk for further clinical deterioration. Furthermore, it is anticipated that the patient will be medically stable for discharge from the hospital within 2 midnights of admission. The following factors support the patient status of observation.   " The patient's presenting symptoms include worsening shortness of breath, orthopnea, and lower extremity edema. " The physical exam findings include bibasilar crackles and bilateral pitting edema. " The initial radiographic and laboratory data are possible pulmonary edema on chest x-ray and elevated BNP of 2,665.9.     For questions or updates, please contact [place] Please consult [email or web address] for contact info under        Signed, Darreld Mclean, PA-C  01/24/2018 7:01 PM   I have examined the patient and reviewed assessment and plan and discussed with patient.  Agree with above as stated.  Patient with CAD, no targets for revascularization.  Also now with acute on chronic systolic heart failure.  WIll hold beta blocker and treat with Lasix for volume overload.  Will have to balance low BP with diuresis.  He is aware of the over all poor prognosis with his heart disease and prior renal cancer.  He requests to be a DNR.    Larae Grooms

## 2018-01-24 NOTE — ED Notes (Signed)
Pt has 2-3+ pitting edema bilateral lower legs

## 2018-01-24 NOTE — Plan of Care (Signed)
  Problem: Activity: Goal: Risk for activity intolerance will decrease Outcome: Progressing   Problem: Pain Managment: Goal: General experience of comfort will improve Outcome: Progressing   Problem: Safety: Goal: Ability to remain free from injury will improve Outcome: Progressing   

## 2018-01-24 NOTE — ED Provider Notes (Signed)
[facility] DEPARTMENT Provider Note   CSN: [id number] Arrival date & time:        History   Chief Complaint Chief Complaint  Patient presents with  . Shortness of Breath  . Chest Pain    HPI Trevor [name] is a 72 y.o. male.  HPI Is a 72 year old male with a past medical history of three-vessel CAD, anemia, arthritis, CHF, diabetes, cardiomyopathy, mitral regurgitation, and previous stroke who presents to the emergency department for evaluation of worsening shortness of breath over the past day as well as worsening chest pain.  Patient reports that he was recently admitted to the hospital 2 times in the last month secondary to similar complaints.  He was found to have a non-ST elevation myocardial infarction for which she was taken to the Cath Lab.  While in the Cath Lab there was three-vessel disease found that was not amenable to percutaneous intervention.  Stated that patient would require CABG, however he is not healthy enough to undergo that procedure at this time.  Patient states that over the past day he is noticed worsening shortness of breath.  States that this is present with any exertion.  Also reports significant orthopnea.  Has been taking his Lasix as prescribed.  In addition to patient's shortness of breath he does describe some chest tightness.  States that this comes and goes and is currently mild.  Denies any fevers, cough, or chest trauma.  Denies any diaphoresis.  Does have significant bilateral lower extremity swelling.  Remaining review of systems as below.  Past Medical History:  Diagnosis Date  . 3-vessel coronary artery disease   . Anemia    2 iron infusions  . Arthritis   . Carotid disease, bilateral (HCC)    moderate  . Chronic systolic heart failure (HCC)    EF 45%  . Coronary artery disease    06/2012:STEMI s/p CABG; 8/18 NSTEMI  . Diabetes mellitus without complication ([place])   . Diarrhea   . Dyspnea    mild  . History of  blood transfusion   . Hyperlipidemia   . Ischemic cardiomyopathy    Ejection fraction of 35-40% initially, 45 - 50% on echo 2014, 53% by perfusion study 2016  . Mild mitral regurgitation   . Numbness    Right side since stroke  . OA (osteoarthritis)   . PAD (peripheral artery disease) ([place])   . Pulmonary nodule   . Renal cell cancer, left ([place]) 12/2016   Associated w/ pulmonary nodules  . Stroke ([place]) 2018  . TIA (transient ischemic attack) 10/2016  . Weakness generalized   . Wears glasses     Patient Active Problem List   Diagnosis Date Noted  . Acute systolic (congestive) heart failure ([place]) 01/24/2018  . Acute on chronic combined systolic (congestive) and diastolic (congestive) heart failure ([place]) 01/14/2018  . CHF (congestive heart failure) ([place]) 01/13/2018  . CKD (chronic kidney disease), stage III ([place]) 01/08/2018  . Hyperkalemia 01/08/2018  . DKA (diabetic ketoacidoses) ([place]) 01/08/2018  . Lactic acidosis   . Drug-induced hepatitis 01/02/2018  . Hypertension 11/01/2017  . Encounter for antineoplastic immunotherapy 09/06/2017  . Goals of care, counseling/discussion 07/04/2017  . Malnutrition of moderate degree 04/19/2017  . Colitis 04/19/2017  . Acute kidney injury ([place]) 04/17/2017  . History of CVA (cerebrovascular accident) 04/17/2017  . Severe diarrhea 04/17/2017  . 3-vessel coronary artery disease 04/17/2017  . Chronic combined systolic and diastolic heart failure ([place]) 04/17/2017  . Diabetes  mellitus type 2 in nonobese ([place]) 04/17/2017  . Chronic systolic heart failure ([place])   . Dehydration 04/11/2017  . Nausea without vomiting 04/11/2017  . Elevated LFTs 04/11/2017  . Encounter for antineoplastic chemotherapy 02/05/2017  . Renal cell cancer ([place]) 01/18/2017  . Renal cell carcinoma ([place]) 01/15/2017  . Renal cell cancer, left ([place]) 12/18/2016  . Renal mass 12/06/2016  . Stroke (cerebrum) (HCC) - L P3 segment occlusion, likely embolic, in setting of severe  ischemic cardiomyopathy with HFrEF (20-25%) but now improved to 45-50%, severe bilateral atherosclerotic carotid  11/20/2016  . NSTEMI (non-ST elevated myocardial infarction) ([place])   . Anemia 11/11/2016  . TIA (transient ischemic attack) 10/18/2016  . Iron deficiency anemia due to chronic blood loss 09/20/2015  . Recent weight loss 09/20/2015  . Bilateral leg pain 02/21/2015  . Coronary artery disease   . Ischemic cardiomyopathy   . Hyperlipidemia   . ST elevation myocardial infarction (STEMI) of anterior wall, initial episode of care ([place]) 07/04/2012  . Hyperglycemia 07/04/2012    Past Surgical History:  Procedure Laterality Date  . CARDIOVASCULAR STRESS TEST    . CORONARY ARTERY BYPASS GRAFT N/A 07/05/2012   Procedure: CORONARY ARTERY BYPASS GRAFTING (CABG);  Surgeon: Ivin Poot, MD;  Location: [facility];  Service: Open Heart Surgery;  Laterality: N/A;  . INTRAOPERATIVE TRANSESOPHAGEAL ECHOCARDIOGRAM N/A 07/05/2012   Procedure: INTRAOPERATIVE TRANSESOPHAGEAL ECHOCARDIOGRAM;  Surgeon: Ivin Poot, MD;  Location: [facility];  Service: Open Heart Surgery;  Laterality: N/A;  . LEFT HEART CATH AND CORS/GRAFTS ANGIOGRAPHY N/A 11/14/2016   Procedure: LEFT HEART CATH AND CORS/GRAFTS ANGIOGRAPHY;  Surgeon: Belva Crome, MD;  Location: [facility];  Service: Cardiovascular;  Laterality: N/A;  . LEFT HEART CATH AND CORS/GRAFTS ANGIOGRAPHY N/A 01/08/2018   Procedure: LEFT HEART CATH AND CORS/GRAFTS ANGIOGRAPHY;  Surgeon: Martinique, Peter M, MD;  Location: [facility];  Service: Cardiovascular;  Laterality: N/A;  . LEFT HEART CATHETERIZATION WITH CORONARY ANGIOGRAM N/A 07/04/2012   Procedure: LEFT HEART CATHETERIZATION WITH CORONARY ANGIOGRAM;  Surgeon: Wellington Hampshire, MD;  Location: [facility];  Service: Cardiovascular;  Laterality: N/A;  . RENAL BIOPSY    . ROBOT ASSISTED LAPAROSCOPIC NEPHRECTOMY Left 02/21/2017   Procedure: XI ROBOTIC ASSISTED LAPAROSCOPIC NEPHRECTOMY;  Surgeon:  Alexis Frock, MD;  Location: [facility];  Service: Urology;  Laterality: Left;        Home Medications    Prior to Admission medications   Medication Sig Start Date End Date Taking? Authorizing Provider  amoxicillin-clavulanate (AUGMENTIN) 875-125 MG tablet Take 1 tablet by mouth every 12 (twelve) hours. FOR 10 DAYS 01/17/18 01/26/18 Yes [provider]  aspirin EC 81 MG tablet Take 81 mg by mouth daily.   Yes [provider]  atorvastatin (LIPITOR) 40 MG tablet Take 1 tablet (40 mg total) by mouth daily. Patient taking differently: Take 40 mg by mouth at bedtime.  05/20/13  Yes Wellington Hampshire, MD  benzonatate (TESSALON) 200 MG capsule Take 200 mg by mouth 3 (three) times daily.   Yes [provider]  carvedilol (COREG) 3.125 MG tablet Take 1 tablet (3.125 mg total) by mouth 2 (two) times daily with a meal. 01/22/18  Yes Wellington Hampshire, MD  clopidogrel (PLAVIX) 75 MG tablet Take 1 tablet (75 mg total) by mouth daily. 01/10/18 02/09/18 Yes Donne Hazel, MD  ferrous sulfate 325 (65 FE) MG tablet Take 325 mg by mouth daily with breakfast.   Yes [provider]  furosemide (LASIX) 20 MG tablet Take 1 tablet (20 mg total) by mouth daily as needed. Patient taking differently: Take 20-40 mg by mouth See admin instructions. Take 40 mg by mouth once a day for 3 days, beginning on 01/22/18, then resume taking 20 mg once a day thereafter 01/15/18 01/15/19 Yes Elodia Florence., MD  isosorbide mononitrate (IMDUR) 30 MG 24 hr tablet Take 1 tablet (30 mg total) by mouth daily. 01/10/18 02/09/18 Yes Donne Hazel, MD  sitaGLIPtin (JANUVIA) 50 MG tablet Take 1 tablet (50 mg total) by mouth daily. 01/09/18 02/08/18 Yes Donne Hazel, MD    Family History Family History  Problem Relation Age of Onset  . Cancer Mother        leukemia  . Stroke Mother   . Cancer Maternal Grandfather        possible cancer  . Heart attack Brother 64  . CAD Brother      Social History Social History   Tobacco Use  . Smoking status: Former Smoker    Packs/day: 1.00    Years: 50.00    Pack years: 50.00    Last attempt to quit: 04/20/2012    Years since quitting: 5.7  . Smokeless tobacco: Never Used  Substance Use Topics  . Alcohol use: No  . Drug use: No     Allergies   Patient has no known allergies.   Review of Systems Review of Systems  Constitutional: Negative for chills and fever.  HENT: Negative for ear pain and sore throat.   Eyes: Negative for pain and visual disturbance.  Respiratory: Positive for shortness of breath. Negative for cough.   Cardiovascular: Positive for chest pain and leg swelling. Negative for palpitations.  Gastrointestinal: Negative for abdominal pain and vomiting.  Genitourinary: Negative for dysuria and hematuria.  Musculoskeletal: Negative for arthralgias and back pain.  Skin: Negative for color change and rash.  Neurological: Negative for seizures and syncope.  All other systems reviewed and are negative.    Physical Exam Updated Vital Signs BP 104/73 (BP Location: Left Arm)   Pulse (!) 120   Temp 98.1 F (36.7 C) (Oral)   Resp 18   Ht 5\' 11"  (1.803 m)   Wt 76.7 kg Comment: Scale C  SpO2 96%   BMI 23.58 kg/m   Physical Exam  Constitutional: He appears well-developed and well-nourished.  HENT:  Head: Normocephalic and atraumatic.  Eyes: Conjunctivae are normal.  Neck: Neck supple.  Cardiovascular: Normal rate and regular rhythm.  Pulmonary/Chest: Effort normal. No respiratory distress.  Crackles present in the bilateral bases.   Abdominal: Soft. There is no tenderness.  Musculoskeletal:       Right lower leg: He exhibits edema.       Left lower leg: He exhibits edema.  Neurological: He is alert.  Skin: Skin is warm and dry.  Psychiatric: He has a normal mood and affect.  Nursing note and vitals reviewed.    ED Treatments / Results  Labs (all labs ordered are listed, but only  abnormal results are displayed) Labs Reviewed  COMPREHENSIVE METABOLIC PANEL - Abnormal; Notable for the following components:      Result Value   Glucose, Bld 163 (*)    BUN 26 (*)    Creatinine, Ser 1.80 (*)    Calcium 8.4 (*)    Total Protein 5.7 (*)    Albumin 2.7 (*)    AST 49 (*)    ALT 67 (*)  Alkaline Phosphatase 306 (*)    GFR calc non Af Amer 36 (*)    GFR calc Af Amer 42 (*)    All other components within normal limits  BRAIN NATRIURETIC PEPTIDE - Abnormal; Notable for the following components:   B Natriuretic Peptide 2,665.9 (*)    All other components within normal limits  CBC WITH DIFFERENTIAL/PLATELET - Abnormal; Notable for the following components:   RBC 3.16 (*)    Hemoglobin 8.5 (*)    HCT 28.1 (*)    RDW 19.8 (*)    Monocytes Absolute 1.1 (*)    All other components within normal limits  BASIC METABOLIC PANEL - Abnormal; Notable for the following components:   Glucose, Bld 176 (*)    BUN 30 (*)    Creatinine, Ser 1.93 (*)    Calcium 8.8 (*)    GFR calc non Af Amer 33 (*)    GFR calc Af Amer 39 (*)    All other components within normal limits  CBC - Abnormal; Notable for the following components:   RBC 3.25 (*)    Hemoglobin 8.5 (*)    HCT 28.8 (*)    MCHC 29.5 (*)    RDW 19.7 (*)    All other components within normal limits  GLUCOSE, CAPILLARY - Abnormal; Notable for the following components:   Glucose-Capillary 188 (*)    All other components within normal limits  GLUCOSE, CAPILLARY - Abnormal; Notable for the following components:   Glucose-Capillary 157 (*)    All other components within normal limits  GLUCOSE, CAPILLARY - Abnormal; Notable for the following components:   Glucose-Capillary 269 (*)    All other components within normal limits  I-STAT TROPONIN, ED - Abnormal; Notable for the following components:   Troponin i, poc 0.27 (*)    All other components within normal limits  CBG MONITORING, ED    EKG EKG  Interpretation  Date/Time:  Thursday January 24 2018 16:41:57 EST Ventricular Rate:  105 PR Interval:    QRS Duration: 85 QT Interval:  359 QTC Calculation: 475 R Axis:   66 Text Interpretation:  Sinus tachycardia Atrial premature complex Probable left atrial enlargement Repol abnrm, severe global ischemia (LM/MVD) When compared to prior, similar ST changes with posibly more pronounced ST depresison in lead V6.  No STEMI Confirmed by Antony Blackbird [phone]) on 01/24/2018 4:46:41 PM   Radiology Dg Chest Portable 1 View  Result Date: 01/24/2018 CLINICAL DATA:  SOB over the past several weeks, becoming much more severe over the past 2 days. Pt states laying supine is unbearable and he can't breathe at all. Pt has been trying to sleep sitting upright. EXAM: PORTABLE CHEST 1 VIEW COMPARISON:  01/13/2018 FINDINGS: Median sternotomy and CABG. The heart is enlarged. There is perihilar opacity bilaterally, RIGHT greater than LEFT. Considerations include pulmonary edema and/or infectious process. No definite pleural effusions. IMPRESSION: Perihilar infiltrates, RIGHT greater than LEFT. Favor pulmonary edema. Infectious infiltrates could have a similar appearance. Electronically Signed   By: Nolon Nations M.D.   On: 01/24/2018 17:26    Procedures Procedures (including critical care time)  Medications Ordered in ED Medications  aspirin EC tablet 81 mg (81 mg Oral Given 01/25/18 1001)  atorvastatin (LIPITOR) tablet 40 mg (40 mg Oral Given 01/25/18 1001)  clopidogrel (PLAVIX) tablet 75 mg (75 mg Oral Given 01/25/18 1001)  ferrous sulfate tablet 325 mg (325 mg Oral Given 01/25/18 0706)  nitroGLYCERIN (NITROSTAT) SL tablet 0.4 mg (has no administration  in time range)  acetaminophen (TYLENOL) tablet 650 mg (has no administration in time range)  ondansetron (ZOFRAN) injection 4 mg (has no administration in time range)  heparin injection 5,000 Units (5,000 Units Subcutaneous Given 01/25/18 0706)  furosemide  (LASIX) injection 60 mg (60 mg Intravenous Given 01/25/18 0830)  insulin aspart (novoLOG) injection 0-15 Units (8 Units Subcutaneous Given 01/25/18 1145)  insulin aspart (novoLOG) injection 0-5 Units (0 Units Subcutaneous Not Given 01/24/18 2157)  furosemide (LASIX) injection 20 mg (20 mg Intravenous Given 01/24/18 1840)  furosemide (LASIX) injection 40 mg (40 mg Intravenous Given 01/24/18 2139)  zolpidem (AMBIEN) tablet 5 mg (5 mg Oral Given 01/25/18 0139)     Initial Impression / Assessment and Plan / ED Course  I have reviewed the triage vital signs and the nursing notes.  Pertinent labs & imaging results that were available during my care of the patient were reviewed by me and considered in my medical decision making (see chart for details).     Patient is a 72 year old male with a past medical history as detailed above who presents to the emergency department for evaluation of chest pain and shortness of breath.  As detailed above patient recently underwent a cardiac catheterization which did show three-vessel disease but patient is not currently a candidate for a CABG secondary to his other comorbidities.  Arrival to the emergency department patient is resting comfortably and in no acute distress.  He does endorse chest tightness and shortness of breath.  Secondary to patient's significant cardiac history as well as his presenting symptoms, EKG, chest x-ray, and multiple labs were obtained.  Patient's EKG does show some ST depressions in the lateral leads that are worse than previous.  His chest x-ray does show findings consistent with pulmonary edema.  Patient's labs do show a persistently elevated troponin that is downtrending from previous and an elevated BNP.  Secondary to patient's presenting symptoms, laboratory findings, imaging findings, and his physical exam cardiology was consulted to evaluate the patient while in the emergency department.  He was given IV Lasix secondary to his apparent  volume overload.  After evaluating the patient in the emergency department he was excepted to the cardiology service for further evaluation and care.  Patient with no further acute events while under my care.  I feel it is likely that patient is suffering from a CHF exacerbation.  However, given patient's mild EKG changes and persistently elevated troponin acute ischemia is still possible.  For further information regarding patient's continued hospital stay please see admitting team documentation.  The care of this patient was discussed with my attending physician Dr. Sherry Ruffing, who voices agreement with work-up and ED disposition.  Final Clinical Impressions(s) / ED Diagnoses   Final diagnoses:  Acute on chronic congestive heart failure, unspecified heart failure type [facility])    ED Discharge Orders    None       Eshika Reckart, Chanda Busing, MD 01/25/18 1233    Tegeler, Gwenyth Allegra, MD 01/25/18 1534

## 2018-01-24 NOTE — ED Notes (Signed)
Gave pt [place] sandwich and Diet Coke, per Santiago Glad - RN.

## 2018-01-24 NOTE — ED Triage Notes (Addendum)
Pt arrives to ED from home with complaints of shortness of breath and chest pain for past few days. EMS reports pt received ASA x 4. Pt placed in position of comfort with bed locked and lowered, call bell in reach. Has increased lasix to 40mg  for past 3 days, was only taking lasix 20mg  as needed.

## 2018-01-25 ENCOUNTER — Other Ambulatory Visit: Payer: Self-pay

## 2018-01-25 DIAGNOSIS — I255 Ischemic cardiomyopathy: Secondary | ICD-10-CM | POA: Diagnosis present

## 2018-01-25 DIAGNOSIS — I2510 Atherosclerotic heart disease of native coronary artery without angina pectoris: Secondary | ICD-10-CM | POA: Diagnosis present

## 2018-01-25 DIAGNOSIS — Z905 Acquired absence of kidney: Secondary | ICD-10-CM | POA: Diagnosis not present

## 2018-01-25 DIAGNOSIS — Z7952 Long term (current) use of systemic steroids: Secondary | ICD-10-CM | POA: Diagnosis not present

## 2018-01-25 DIAGNOSIS — Z85528 Personal history of other malignant neoplasm of kidney: Secondary | ICD-10-CM | POA: Diagnosis not present

## 2018-01-25 DIAGNOSIS — I340 Nonrheumatic mitral (valve) insufficiency: Secondary | ICD-10-CM | POA: Diagnosis present

## 2018-01-25 DIAGNOSIS — I25118 Atherosclerotic heart disease of native coronary artery with other forms of angina pectoris: Secondary | ICD-10-CM | POA: Diagnosis not present

## 2018-01-25 DIAGNOSIS — D649 Anemia, unspecified: Secondary | ICD-10-CM | POA: Diagnosis present

## 2018-01-25 DIAGNOSIS — I779 Disorder of arteries and arterioles, unspecified: Secondary | ICD-10-CM | POA: Diagnosis present

## 2018-01-25 DIAGNOSIS — Z8673 Personal history of transient ischemic attack (TIA), and cerebral infarction without residual deficits: Secondary | ICD-10-CM | POA: Diagnosis not present

## 2018-01-25 DIAGNOSIS — I5023 Acute on chronic systolic (congestive) heart failure: Secondary | ICD-10-CM | POA: Diagnosis not present

## 2018-01-25 DIAGNOSIS — I5021 Acute systolic (congestive) heart failure: Secondary | ICD-10-CM | POA: Diagnosis not present

## 2018-01-25 DIAGNOSIS — I252 Old myocardial infarction: Secondary | ICD-10-CM | POA: Diagnosis not present

## 2018-01-25 DIAGNOSIS — N183 Chronic kidney disease, stage 3 (moderate): Secondary | ICD-10-CM

## 2018-01-25 DIAGNOSIS — I5043 Acute on chronic combined systolic (congestive) and diastolic (congestive) heart failure: Secondary | ICD-10-CM | POA: Diagnosis present

## 2018-01-25 DIAGNOSIS — I472 Ventricular tachycardia: Secondary | ICD-10-CM | POA: Diagnosis not present

## 2018-01-25 DIAGNOSIS — Z87891 Personal history of nicotine dependence: Secondary | ICD-10-CM | POA: Diagnosis not present

## 2018-01-25 DIAGNOSIS — M1990 Unspecified osteoarthritis, unspecified site: Secondary | ICD-10-CM | POA: Diagnosis present

## 2018-01-25 DIAGNOSIS — N184 Chronic kidney disease, stage 4 (severe): Secondary | ICD-10-CM | POA: Diagnosis present

## 2018-01-25 DIAGNOSIS — Z79899 Other long term (current) drug therapy: Secondary | ICD-10-CM | POA: Diagnosis not present

## 2018-01-25 DIAGNOSIS — E1122 Type 2 diabetes mellitus with diabetic chronic kidney disease: Secondary | ICD-10-CM | POA: Diagnosis present

## 2018-01-25 DIAGNOSIS — E785 Hyperlipidemia, unspecified: Secondary | ICD-10-CM | POA: Diagnosis present

## 2018-01-25 DIAGNOSIS — E1151 Type 2 diabetes mellitus with diabetic peripheral angiopathy without gangrene: Secondary | ICD-10-CM | POA: Diagnosis present

## 2018-01-25 DIAGNOSIS — Z951 Presence of aortocoronary bypass graft: Secondary | ICD-10-CM | POA: Diagnosis not present

## 2018-01-25 DIAGNOSIS — Z8249 Family history of ischemic heart disease and other diseases of the circulatory system: Secondary | ICD-10-CM | POA: Diagnosis not present

## 2018-01-25 DIAGNOSIS — R296 Repeated falls: Secondary | ICD-10-CM | POA: Diagnosis present

## 2018-01-25 DIAGNOSIS — I130 Hypertensive heart and chronic kidney disease with heart failure and stage 1 through stage 4 chronic kidney disease, or unspecified chronic kidney disease: Secondary | ICD-10-CM | POA: Diagnosis present

## 2018-01-25 DIAGNOSIS — Z66 Do not resuscitate: Secondary | ICD-10-CM | POA: Diagnosis present

## 2018-01-25 LAB — CBC
HEMATOCRIT: 28.8 % — AB (ref 39.0–52.0)
Hemoglobin: 8.5 g/dL — ABNORMAL LOW (ref 13.0–17.0)
MCH: 26.2 pg (ref 26.0–34.0)
MCHC: 29.5 g/dL — ABNORMAL LOW (ref 30.0–36.0)
MCV: 88.6 fL (ref 80.0–100.0)
NRBC: 0 % (ref 0.0–0.2)
Platelets: 314 10*3/uL (ref 150–400)
RBC: 3.25 MIL/uL — AB (ref 4.22–5.81)
RDW: 19.7 % — AB (ref 11.5–15.5)
WBC: 9.8 10*3/uL (ref 4.0–10.5)

## 2018-01-25 LAB — BASIC METABOLIC PANEL
Anion gap: 8 (ref 5–15)
BUN: 30 mg/dL — ABNORMAL HIGH (ref 8–23)
CHLORIDE: 99 mmol/L (ref 98–111)
CO2: 29 mmol/L (ref 22–32)
CREATININE: 1.93 mg/dL — AB (ref 0.61–1.24)
Calcium: 8.8 mg/dL — ABNORMAL LOW (ref 8.9–10.3)
GFR calc Af Amer: 39 mL/min — ABNORMAL LOW (ref >60)
GFR calc non Af Amer: 33 mL/min — ABNORMAL LOW (ref >60)
GLUCOSE: 176 mg/dL — AB (ref 70–99)
Potassium: 4 mmol/L (ref 3.5–5.1)
Sodium: 136 mmol/L (ref 135–145)

## 2018-01-25 LAB — GLUCOSE, CAPILLARY
GLUCOSE-CAPILLARY: 167 mg/dL — AB (ref 70–99)
Glucose-Capillary: 157 mg/dL — ABNORMAL HIGH (ref 70–99)
Glucose-Capillary: 269 mg/dL — ABNORMAL HIGH (ref 70–99)
Glucose-Capillary: 117 mg/dL — ABNORMAL HIGH (ref 70–99)

## 2018-01-25 MED ORDER — ZOLPIDEM TARTRATE 5 MG PO TABS
5.0000 mg | ORAL_TABLET | Freq: Once | ORAL | Status: AC
  Administered 2018-01-25: 5 mg via ORAL
  Filled 2018-01-25: qty 1

## 2018-01-25 NOTE — Progress Notes (Addendum)
Progress Note  Patient Name: Trevor [name] Date of Encounter: 01/25/2018  Primary Cardiologist: [name], MD   Subjective   Feels better, but not back to baseline.  Able to lie flatter.  Inpatient Medications    Scheduled Meds: . aspirin EC  81 mg Oral Daily  . atorvastatin  40 mg Oral Daily  . clopidogrel  75 mg Oral Daily  . ferrous sulfate  325 mg Oral Q breakfast  . furosemide  60 mg Intravenous BID  . heparin  5,000 Units Subcutaneous Q8H  . insulin aspart  0-15 Units Subcutaneous TID WC  . insulin aspart  0-5 Units Subcutaneous QHS   Continuous Infusions:  PRN Meds: acetaminophen, nitroGLYCERIN, ondansetron (ZOFRAN) IV   Vital Signs    Vitals:   01/24/18 2153 01/25/18 0005 01/25/18 0044 01/25/18 0405  BP: 91/67  (!) 85/36 94/67  Pulse: (!) 102  (!) 107 (!) 115  Resp:   20 20  Temp:   98.2 F (36.8 C) 98.3 F (36.8 C)  TempSrc:   Oral Oral  SpO2:   96% 100%  Weight:  76.7 kg    Height:  5\' 11"  (1.803 m)      Intake/Output Summary (Last 24 hours) at 01/25/2018 1115 Last data filed at 01/25/2018 0859 Gross per 24 hour  Intake 480 ml  Output 525 ml  Net -45 ml   Filed Weights   01/24/18 1645 01/25/18 0005  Weight: 76.2 kg 76.7 kg    Telemetry     sinus tachycardia- Personally Reviewed  ECG    Sinus tach with PVCs - Personally Reviewed  Physical Exam   GEN: No acute distress.   Neck: No JVD Cardiac: tachycardic, no murmurs, rubs, or gallops.  Respiratory: Crackles to auscultation bilaterally. GI: Soft, nontender, non-distended  MS: decreased LE edema; No deformity. Neuro:  Nonfocal  Psych: Normal affect   Labs    Chemistry Recent Labs  Lab 01/24/18 1647 01/25/18 0457  NA 135 136  K 3.7 4.0  CL 98 99  CO2 25 29  GLUCOSE 163* 176*  BUN 26* 30*  CREATININE 1.80* 1.93*  CALCIUM 8.4* 8.8*  PROT 5.7*  --   ALBUMIN 2.7*  --   AST 49*  --   ALT 67*  --   ALKPHOS 306*  --   BILITOT 1.1  --   GFRNONAA 36* 33*  GFRAA 42* 39*   ANIONGAP 12 8     Hematology Recent Labs  Lab 01/24/18 1647 01/25/18 0457  WBC 9.8 9.8  RBC 3.16* 3.25*  HGB 8.5* 8.5*  HCT 28.1* 28.8*  MCV 88.9 88.6  MCH 26.9 26.2  MCHC 30.2 29.5*  RDW 19.8* 19.7*  PLT 280 314    Cardiac EnzymesNo results for input(s): TROPONINI in the last 168 hours.  Recent Labs  Lab 01/24/18 1646  TROPIPOC 0.27*     BNP Recent Labs  Lab 01/24/18 1647  BNP 2,665.9*     DDimer No results for input(s): DDIMER in the last 168 hours.   Radiology    Dg Chest Portable 1 View  Result Date: 01/24/2018 CLINICAL DATA:  SOB over the past several weeks, becoming much more severe over the past 2 days. Pt states laying supine is unbearable and he can't breathe at all. Pt has been trying to sleep sitting upright. EXAM: PORTABLE CHEST 1 VIEW COMPARISON:  01/13/2018 FINDINGS: Median sternotomy and CABG. The heart is enlarged. There is perihilar opacity bilaterally, RIGHT greater than LEFT. Considerations  include pulmonary edema and/or infectious process. No definite pleural effusions. IMPRESSION: Perihilar infiltrates, RIGHT greater than LEFT. Favor pulmonary edema. Infectious infiltrates could have a similar appearance. Electronically Signed   By: Nolon Nations M.D.   On: 01/24/2018 17:26    Cardiac Studies     Patient Profile     72 y.o. male   Assessment & Plan    1) Acute on chronic systolic heart failure: COntinue IV Lasix.  Cr stable, 1.9 today. Stage III CKD.  Hopefully, can change to oral Lasix tomorrow.  2) No plans for ischemic testing.  Medical therapy of CAD.  I think sx were more related to fluid overload.   3) BP better, may be able to restart low dose Coreg tomorrow after more volume removal.  4) DNR  For questions or updates, please contact [place] Please consult [email or web address] for contact info under        Signed, Larae Grooms, MD  01/25/2018, 11:15 AM

## 2018-01-25 NOTE — Progress Notes (Signed)
Pt. Up to the restroom. RN at bedside (standby assist). RN notified by CCMD that pt. Had short run of SVT, 12 beats of VTach. HR now sustaining 110s. Pt. Alert and stable. No distress noted. Pt. Now resting in bed.  RN will continue to monitor pt.

## 2018-01-25 NOTE — Progress Notes (Signed)
Pt became SOB while ambulating back to his room from the hallway. O2 sat stayed at 100% on room air and HR was 90s-110s. Pt put in wheelchair and was returned to his room.

## 2018-01-25 NOTE — Evaluation (Signed)
Physical Therapy Evaluation Patient Details Name: Trevor [name] MRN: [id number] DOB: 12-28-1945 Today's Date: 01/25/2018   History of Present Illness  72 y.o. Caucasian male with CAD with anterior STEMI in 2014 s/p CABG, ischemic cardiomyopathy with EF of 20-25%, type 2 diabetes, PAD, stroke in 2018, stage IV renal cell carcinoma with pulmonary nodules s/p nephrectomy in 02/2017, and chronic anemia.  He presented to the ED for worsening shortness of breath and orthopnea.     Clinical Impression  Pt admitted with above diagnosis. Pt currently with functional limitations due to the deficits listed below (see PT Problem List). On eval, pt required supervision transfers and ambulation 150 feet without AD. Distance limited by fatigue. SpO2 on RA 97% at rest and during ambulation. Max HR 125. Pt will benefit from skilled PT to increase their independence and safety with mobility to allow discharge to the venue listed below.  PT to follow acutely. No follow up services indicated. Pt would benefit from ambulating with the mobility tech.     Follow Up Recommendations No PT follow up    Equipment Recommendations  None recommended by PT    Recommendations for Other Services       Precautions / Restrictions Precautions Precautions: Other (comment) Precaution Comments: watch HR and sats      Mobility  Bed Mobility Overal bed mobility: Modified Independent             General bed mobility comments: increased time  Transfers Overall transfer level: Needs assistance Equipment used: None Transfers: Sit to/from Stand;Stand Pivot Transfers Sit to Stand: Supervision Stand pivot transfers: Supervision       General transfer comment: supervision for safety  Ambulation/Gait Ambulation/Gait assistance: Supervision Gait Distance (Feet): 150 Feet Assistive device: None Gait Pattern/deviations: WFL(Within Functional Limits) Gait velocity: mildly decreased Gait velocity interpretation: 1.31  - 2.62 ft/sec, indicative of limited community ambulator General Gait Details: At rest HR 108 and SpO2 97% on RA. During ambulation on RA, SpO2 97% and max HR 125.  Stairs            Wheelchair Mobility    Modified Rankin (Stroke Patients Only)       Balance Overall balance assessment: No apparent balance deficits (not formally assessed)                                           Pertinent Vitals/Pain Pain Assessment: No/denies pain    Home Living Family/patient expects to be discharged to:: Private residence Living Arrangements: Other relatives Available Help at Discharge: Family;Available 24 hours/day Type of Home: House Home Access: Stairs to enter Entrance Stairs-Rails: Can reach both   Home Layout: One level Home Equipment: None      Prior Function Level of Independence: Independent               Hand Dominance   Dominant Hand: Right    Extremity/Trunk Assessment   Upper Extremity Assessment Upper Extremity Assessment: Overall WFL for tasks assessed    Lower Extremity Assessment Lower Extremity Assessment: Generalized weakness    Cervical / Trunk Assessment Cervical / Trunk Assessment: Normal  Communication   Communication: No difficulties  Cognition Arousal/Alertness: Awake/alert Behavior During Therapy: WFL for tasks assessed/performed Overall Cognitive Status: Within Functional Limits for tasks assessed  General Comments      Exercises     Assessment/Plan    PT Assessment Patient needs continued PT services  PT Problem List Decreased mobility;Decreased strength;Decreased activity tolerance;Cardiopulmonary status limiting activity       PT Treatment Interventions Therapeutic activities;Gait training;Therapeutic exercise;Patient/family education;Stair training;Functional mobility training    PT Goals (Current goals can be found in the Care Plan section)   Acute Rehab PT Goals Patient Stated Goal: home, breathe better PT Goal Formulation: With patient Time For Goal Achievement: 02/08/18 Potential to Achieve Goals: Good    Frequency Min 3X/week   Barriers to discharge        Co-evaluation               AM-PAC PT "6 Clicks" Daily Activity  Outcome Measure Difficulty turning over in bed (including adjusting bedclothes, sheets and blankets)?: None Difficulty moving from lying on back to sitting on the side of the bed? : None Difficulty sitting down on and standing up from a chair with arms (e.g., wheelchair, bedside commode, etc,.)?: None Help needed moving to and from a bed to chair (including a wheelchair)?: None Help needed walking in hospital room?: A Little Help needed climbing 3-5 steps with a railing? : A Little 6 Click Score: 22    End of Session Equipment Utilized During Treatment: Gait belt Activity Tolerance: Patient tolerated treatment well Patient left: in chair;with call bell/phone within reach Nurse Communication: Mobility status PT Visit Diagnosis: Difficulty in walking, not elsewhere classified (R26.2)    Time: 1208-1220 PT Time Calculation (min) (ACUTE ONLY): 12 min   Charges:   PT Evaluation $PT Eval Low Complexity: 1 Low          Lorrin Goodell, PT  Office # [phone] Pager [phone]   Lorriane Shire 01/25/2018, 1:13 PM

## 2018-01-25 NOTE — Plan of Care (Signed)
  Problem: Education: Goal: Knowledge of General Education information will improve Description Including pain rating scale, medication(s)/side effects and non-pharmacologic comfort measures Outcome: Progressing   Problem: Clinical Measurements: Goal: Ability to maintain clinical measurements within normal limits will improve Outcome: Progressing   Problem: Clinical Measurements: Goal: Respiratory complications will improve Outcome: Progressing   Problem: Nutrition: Goal: Adequate nutrition will be maintained Outcome: Progressing   Problem: Safety: Goal: Ability to remain free from injury will improve Outcome: Progressing

## 2018-01-25 NOTE — Progress Notes (Signed)
Notified Dr Eugene Garnet of patient's HR sustaining from 120's 10 140's. Patient is asymptomatic and asleep. MD states no intervention at this time. Requests RN to continue to monitor patient.

## 2018-01-26 LAB — BASIC METABOLIC PANEL
Anion gap: 14 (ref 5–15)
BUN: 30 mg/dL — AB (ref 8–23)
CALCIUM: 8.2 mg/dL — AB (ref 8.9–10.3)
CO2: 24 mmol/L (ref 22–32)
Chloride: 94 mmol/L — ABNORMAL LOW (ref 98–111)
Creatinine, Ser: 1.89 mg/dL — ABNORMAL HIGH (ref 0.61–1.24)
GFR calc Af Amer: 40 mL/min — ABNORMAL LOW (ref >60)
GFR, EST NON AFRICAN AMERICAN: 34 mL/min — AB (ref >60)
GLUCOSE: 274 mg/dL — AB (ref 70–99)
POTASSIUM: 3.5 mmol/L (ref 3.5–5.1)
Sodium: 132 mmol/L — ABNORMAL LOW (ref 135–145)

## 2018-01-26 LAB — GLUCOSE, CAPILLARY
GLUCOSE-CAPILLARY: 121 mg/dL — AB (ref 70–99)
GLUCOSE-CAPILLARY: 178 mg/dL — AB (ref 70–99)
GLUCOSE-CAPILLARY: 154 mg/dL — AB (ref 70–99)
Glucose-Capillary: 259 mg/dL — ABNORMAL HIGH (ref 70–99)

## 2018-01-26 MED ORDER — CARVEDILOL 3.125 MG PO TABS
3.1250 mg | ORAL_TABLET | Freq: Two times a day (BID) | ORAL | Status: DC
  Administered 2018-01-26 – 2018-01-27 (×2): 3.125 mg via ORAL
  Filled 2018-01-26 (×2): qty 1

## 2018-01-26 MED ORDER — FUROSEMIDE 40 MG PO TABS
40.0000 mg | ORAL_TABLET | Freq: Two times a day (BID) | ORAL | Status: DC
  Administered 2018-01-26 – 2018-01-27 (×2): 40 mg via ORAL
  Filled 2018-01-26 (×2): qty 1

## 2018-01-26 NOTE — Progress Notes (Signed)
Progress Note  Patient Name: Trevor [name] Date of Encounter: 01/26/2018  Primary Cardiologist:   [name], MD   Subjective   He feels much better and is close to baseline.  Inpatient Medications    Scheduled Meds: . aspirin EC  81 mg Oral Daily  . atorvastatin  40 mg Oral Daily  . clopidogrel  75 mg Oral Daily  . ferrous sulfate  325 mg Oral Q breakfast  . furosemide  60 mg Intravenous BID  . heparin  5,000 Units Subcutaneous Q8H  . insulin aspart  0-15 Units Subcutaneous TID WC  . insulin aspart  0-5 Units Subcutaneous QHS   Continuous Infusions:  PRN Meds: acetaminophen, nitroGLYCERIN, ondansetron (ZOFRAN) IV   Vital Signs    Vitals:   01/25/18 2305 01/26/18 0009 01/26/18 0541 01/26/18 0545  BP: (!) 90/55 95/73  103/70  Pulse:  (!) 119  (!) 110  Resp:  20  20  Temp:  98.4 F (36.9 C)  98.4 F (36.9 C)  TempSrc:  Oral  Oral  SpO2:  100%  100%  Weight:   75.5 kg   Height:        Intake/Output Summary (Last 24 hours) at 01/26/2018 0834 Last data filed at 01/25/2018 2137 Gross per 24 hour  Intake 960 ml  Output 725 ml  Net 235 ml   Filed Weights   01/24/18 1645 01/25/18 0005 01/26/18 0541  Weight: 76.2 kg 76.7 kg 75.5 kg    Telemetry    NSR, atrial tach, NSVT, predominant atrial tach - Personally Reviewed  ECG    NA - Personally Reviewed  Physical Exam   GEN: No acute distress.   Neck: No  JVD Cardiac: RRR, no murmurs, rubs, or gallops.  Respiratory: Clear  to auscultation bilaterally. GI: Soft, nontender, non-distended  MS: Mild ankle edema; No deformity. Neuro:  Nonfocal  Psych: Normal affect   Labs    Chemistry Recent Labs  Lab 01/24/18 1647 01/25/18 0457 01/26/18 0459  NA 135 136 132*  K 3.7 4.0 3.5  CL 98 99 94*  CO2 25 29 24   GLUCOSE 163* 176* 274*  BUN 26* 30* 30*  CREATININE 1.80* 1.93* 1.89*  CALCIUM 8.4* 8.8* 8.2*  PROT 5.7*  --   --   ALBUMIN 2.7*  --   --   AST 49*  --   --   ALT 67*  --   --   ALKPHOS  306*  --   --   BILITOT 1.1  --   --   GFRNONAA 36* 33* 34*  GFRAA 42* 39* 40*  ANIONGAP 12 8 14      Hematology Recent Labs  Lab 01/24/18 1647 01/25/18 0457  WBC 9.8 9.8  RBC 3.16* 3.25*  HGB 8.5* 8.5*  HCT 28.1* 28.8*  MCV 88.9 88.6  MCH 26.9 26.2  MCHC 30.2 29.5*  RDW 19.8* 19.7*  PLT 280 314    Cardiac EnzymesNo results for input(s): TROPONINI in the last 168 hours.  Recent Labs  Lab 01/24/18 1646  TROPIPOC 0.27*     BNP Recent Labs  Lab 01/24/18 1647  BNP 2,665.9*     DDimer No results for input(s): DDIMER in the last 168 hours.   Radiology    Dg Chest Portable 1 View  Result Date: 01/24/2018 CLINICAL DATA:  SOB over the past several weeks, becoming much more severe over the past 2 days. Pt states laying supine is unbearable and he can't breathe at all. Pt  has been trying to sleep sitting upright. EXAM: PORTABLE CHEST 1 VIEW COMPARISON:  01/13/2018 FINDINGS: Median sternotomy and CABG. The heart is enlarged. There is perihilar opacity bilaterally, RIGHT greater than LEFT. Considerations include pulmonary edema and/or infectious process. No definite pleural effusions. IMPRESSION: Perihilar infiltrates, RIGHT greater than LEFT. Favor pulmonary edema. Infectious infiltrates could have a similar appearance. Electronically Signed   By: Nolon Nations M.D.   On: 01/24/2018 17:26    Cardiac Studies   NA  Patient Profile     72 y.o. 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  Assessment & Plan    Acute on chronic systolic HF:     Intake and output incomplete.   He urinates while trying to have a BM and we cannot capture the urine.   Give IV Lasix today and then change to PO.    CAD:  No ischemia work up is planned.   Medical management.    CKD IV:  Creat is stable.    ANEMIA:  Chronic and stable.   HTN:   He has been hypotensive.  I will try this evening to start a low dose of the Coreg.    NSVT:  Restart beta blocker.    For questions or updates, please contact [place] Please consult [email or web address] for contact info under Cardiology/STEMI.   Signed, Minus Breeding, MD  01/26/2018, 8:34 AM

## 2018-01-26 NOTE — Plan of Care (Signed)
  Problem: Health Behavior/Discharge Planning: Goal: Ability to manage health-related needs will improve Outcome: Progressing   Problem: Education: Goal: Knowledge of General Education information will improve Description: Including pain rating scale, medication(s)/side effects and non-pharmacologic comfort measures Outcome: Progressing   Problem: Clinical Measurements: Goal: Respiratory complications will improve Outcome: Progressing   

## 2018-01-27 LAB — GLUCOSE, CAPILLARY: Glucose-Capillary: 275 mg/dL — ABNORMAL HIGH (ref 70–99)

## 2018-01-27 MED ORDER — NITROGLYCERIN 0.4 MG SL SUBL: 0.4000 mg | SUBLINGUAL_TABLET | SUBLINGUAL | 2 refills | Status: AC | PRN

## 2018-01-27 MED ORDER — FUROSEMIDE 20 MG PO TABS: 40.0000 mg | ORAL_TABLET | Freq: Every day | ORAL | 3 refills | Status: AC

## 2018-01-27 MED ORDER — CARVEDILOL 3.125 MG PO TABS: 3.1250 mg | ORAL_TABLET | Freq: Every day | ORAL | 3 refills | Status: AC

## 2018-01-27 NOTE — Progress Notes (Signed)
Progress Note  Patient Name: Trevor [name] Date of Encounter: 01/27/2018  Primary Cardiologist:   [name], MD   Subjective   He feels much better and no SOB.   Inpatient Medications    Scheduled Meds: . aspirin EC  81 mg Oral Daily  . atorvastatin  40 mg Oral Daily  . carvedilol  3.125 mg Oral BID WC  . clopidogrel  75 mg Oral Daily  . ferrous sulfate  325 mg Oral Q breakfast  . furosemide  40 mg Oral BID  . heparin  5,000 Units Subcutaneous Q8H  . insulin aspart  0-15 Units Subcutaneous TID WC  . insulin aspart  0-5 Units Subcutaneous QHS   Continuous Infusions:  PRN Meds: acetaminophen, nitroGLYCERIN, ondansetron (ZOFRAN) IV   Vital Signs    Vitals:   01/26/18 1757 01/26/18 1948 01/27/18 0457 01/27/18 0634  BP:  91/65 (!) 88/61 (!) 88/67  Pulse: 98 100 (!) 101 (!) 111  Resp:  (!) 24 20   Temp:  98 F (36.7 C) 98 F (36.7 C) 97.8 F (36.6 C)  TempSrc:  Oral Oral Oral  SpO2:  100% 99% 99%  Weight:   75.1 kg   Height:        Intake/Output Summary (Last 24 hours) at 01/27/2018 0736 Last data filed at 01/27/2018 0531 Gross per 24 hour  Intake 1080 ml  Output 900 ml  Net 180 ml   Filed Weights   01/25/18 0005 01/26/18 0541 01/27/18 0457  Weight: 76.7 kg 75.5 kg 75.1 kg    Telemetry    NSR, sinus tach - Personally Reviewed  ECG    NA - Personally Reviewed  Physical Exam   GEN: No  acute distress.   Neck: No  JVD Cardiac: RRR, no murmurs, rubs, or gallops.  Respiratory: Clear   to auscultation bilaterally. GI: Soft, nontender, non-distended, normal bowel sounds  MS:  Trace ankle edema; No deformity. Neuro:   Nonfocal  Psych: Oriented and appropriate    Labs    Chemistry Recent Labs  Lab 01/24/18 1647 01/25/18 0457 01/26/18 0459  NA 135 136 132*  K 3.7 4.0 3.5  CL 98 99 94*  CO2 25 29 24   GLUCOSE 163* 176* 274*  BUN 26* 30* 30*  CREATININE 1.80* 1.93* 1.89*  CALCIUM 8.4* 8.8* 8.2*  PROT 5.7*  --   --   ALBUMIN 2.7*  --    --   AST 49*  --   --   ALT 67*  --   --   ALKPHOS 306*  --   --   BILITOT 1.1  --   --   GFRNONAA 36* 33* 34*  GFRAA 42* 39* 40*  ANIONGAP 12 8 14      Hematology Recent Labs  Lab 01/24/18 1647 01/25/18 0457  WBC 9.8 9.8  RBC 3.16* 3.25*  HGB 8.5* 8.5*  HCT 28.1* 28.8*  MCV 88.9 88.6  MCH 26.9 26.2  MCHC 30.2 29.5*  RDW 19.8* 19.7*  PLT 280 314    Cardiac EnzymesNo results for input(s): TROPONINI in the last 168 hours.  Recent Labs  Lab 01/24/18 1646  TROPIPOC 0.27*     BNP Recent Labs  Lab 01/24/18 1647  BNP 2,665.9*     DDimer No results for input(s): DDIMER in the last 168 hours.   Radiology    No results found.  Cardiac Studies   NA  Patient Profile     72 y.o. 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  Assessment & Plan    Acute on chronic systolic HF:     Intake and output incomplete as patient is incontinent.  Feels like he is breathing back to baseline.  OK to go home.    Needs TOC on NL office next week with BMET.  Send home on Lasix 40 mg once daily.  Can send home on Coreg if BP this AM is greater than 90.  Start once daily.  CAD: No ischemia work up.  Medical management   CKD IV:  Creat is stable.  Check BMET later this week.  DM:  Start back on previous med Januvia.  ANEMIA:   Chronic.    HTN:   Hypotension as above.  NSVT:   Restarted beta blocker.    DNR:    For questions or updates, please contact [place] Please consult [email or web address] for contact info under Cardiology/STEMI.   Signed, Minus Breeding, MD  01/27/2018, 7:36 AM

## 2018-01-27 NOTE — Progress Notes (Signed)
Pt has orders to be discharged. Discharge instructions given and pt has no additional questions at this time. Medication regimen reviewed and pt educated. Pt verbalized understanding and has no additional questions. Telemetry box removed. IV removed and site in good condition. Pt stable and waiting for transportation. 

## 2018-01-27 NOTE — Discharge Summary (Signed)
Discharge Summary    Patient ID: Trevor [name],  MRN: [id number], DOB/AGE: 1945-10-07 72 y.o.  Admit date: 01/24/2018 Discharge date: 01/27/2018  Primary Care Provider: [name] Primary Cardiologist: [name], MD   Discharge Diagnoses    Principal Problem:   Acute on chronic combined systolic and diastolic CHF (congestive heart failure) ([place]) Active Problems:   Coronary artery disease   Ischemic cardiomyopathy   Hyperlipidemia   Acute systolic (congestive) heart failure ([place])   History of Present Illness     Trevor [name] is a 72 y.o. with past medical history of CAD (s/p CABG in 2014, cath in 12/2017 showing patent LIMA-LAD with known occlusion of SVG's and no good options for PCI), chronic combined systolic and diastolic CHF, ischemic cardiomyopathy (EF 20-25% by echocardiogram in 01/08/2018), PAD, HTN, HLD, Type 2 DM, prior CVA, and Stage 3-4 CKD, and history of RCC (s/p nephrectomy in 02/2017) who presented to [facility] ED on 01/24/2018 for evaluation of worsening dyspnea and chest pain.   He reported having worsening dyspnea on exertion and orthopnea over the past several days and had tried increasing his Lasix with minimal improvement in his symptoms. He initially reported consuming a low-sodium diet but was having fast food and deli meats regularly. Initial troponin was found to be elevated to 0.27 and BNP was 2665. CXR showed perihilar infiltrates which was thought to be most consistent with pulmonary edema.  He was admitted for an acute CHF exacerbation and started on IV Lasix 60mg  BID for diuresis.   Hospital Course     Consultants: None  The following morning, he reported some improvement in his symptoms but was still having significant orthopnea. It does not appear that cyclic troponin values were obtained as this would not have influenced any further procedures given his recent cardiac catheterization and medical management recommended at that time.  During  admission, intake and output was followed but overall challenging to obtain as he had episodes of incontinence. On 01/27/2018, he was evaluated by Dr. Percival Spanish and reported feeling back to baseline. Weight was down 4 lbs from admission (169 --> 165 lbs) and creatinine was overall stable at 1.89 on most recent check.  He was discharged on Lasix 40 mg daily and Coreg 3.125 mg once daily with plans to titrate Coreg to BID dosing as an outpatient pending BP response. The patient is not a candidate for ACE-I/ARB/ARNI given his CKD and hypotension.    A 7-day Hospital Follow-up appointment has been arranged at the [place] office. He will need a repeat BMET at that time. The patient was discharged home in stable condition.  _____________  Discharge Vitals Blood pressure 91/64, pulse (!) 115, temperature 97.8 F (36.6 C), temperature source Oral, resp. rate 20, height 5\' 11"  (1.803 m), weight 75.1 kg, SpO2 99 %.  Filed Weights   01/25/18 0005 01/26/18 0541 01/27/18 0457  Weight: 76.7 kg 75.5 kg 75.1 kg    Labs & Radiologic Studies     CBC Recent Labs    01/24/18 1647 01/25/18 0457  WBC 9.8 9.8  NEUTROABS 7.3  --   HGB 8.5* 8.5*  HCT 28.1* 28.8*  MCV 88.9 88.6  PLT 280 332   Basic Metabolic Panel Recent Labs    01/25/18 0457 01/26/18 0459  NA 136 132*  K 4.0 3.5  CL 99 94*  CO2 29 24  GLUCOSE 176* 274*  BUN 30* 30*  CREATININE 1.93* 1.89*  CALCIUM 8.8* 8.2*   Liver  Function Tests Recent Labs    01/24/18 1647  AST 49*  ALT 67*  ALKPHOS 306*  BILITOT 1.1  PROT 5.7*  ALBUMIN 2.7*   No results for input(s): LIPASE, AMYLASE in the last 72 hours. Cardiac Enzymes No results for input(s): CKTOTAL, CKMB, CKMBINDEX, TROPONINI in the last 72 hours. BNP Invalid input(s): POCBNP D-Dimer No results for input(s): DDIMER in the last 72 hours. Hemoglobin A1C No results for input(s): HGBA1C in the last 72 hours. Fasting Lipid Panel No results for input(s): CHOL, HDL, LDLCALC, TRIG,  CHOLHDL, LDLDIRECT in the last 72 hours. Thyroid Function Tests No results for input(s): TSH, T4TOTAL, T3FREE, THYROIDAB in the last 72 hours.  Invalid input(s): FREET3  Dg Chest Portable 1 View  Result Date: 01/24/2018 CLINICAL DATA:  SOB over the past several weeks, becoming much more severe over the past 2 days. Pt states laying supine is unbearable and he can't breathe at all. Pt has been trying to sleep sitting upright. EXAM: PORTABLE CHEST 1 VIEW COMPARISON:  01/13/2018 FINDINGS: Median sternotomy and CABG. The heart is enlarged. There is perihilar opacity bilaterally, RIGHT greater than LEFT. Considerations include pulmonary edema and/or infectious process. No definite pleural effusions. IMPRESSION: Perihilar infiltrates, RIGHT greater than LEFT. Favor pulmonary edema. Infectious infiltrates could have a similar appearance. Electronically Signed   By: Nolon Nations M.D.   On: 01/24/2018 17:26   Dg Chest Portable 1 View  Result Date: 01/13/2018 CLINICAL DATA:  Shortness of breath. EXAM: PORTABLE CHEST 1 VIEW COMPARISON:  January 07, 2018 FINDINGS: Stable cardiomegaly. Mild pulmonary venous congestion. Probable small left effusion with atelectasis. IMPRESSION: Cardiomegaly and pulmonary venous congestion. Probable small left effusion with atelectasis. Electronically Signed   By: Dorise Bullion III M.D   On: 01/13/2018 16:59   Dg Chest Portable 1 View  Result Date: 01/07/2018 CLINICAL DATA:  Chest pain hyperglycemia EXAM: PORTABLE CHEST 1 VIEW COMPARISON:  CT 10/31/2016, radiograph 10/03/2017 FINDINGS: Post sternotomy changes. Cardiomegaly. No focal opacity or pleural effusion. No pneumothorax. IMPRESSION: No active disease.  Stable mild cardiomegaly. Electronically Signed   By: Donavan Foil M.D.   On: 01/07/2018 21:38   Vas US Carotid  Result Date: 12/31/2017 Carotid Arterial Duplex Study Indications:  In 12/2016, a carotid duplex showed 371/71 cm/s in the RICA and                458/09 cm/s in the LICA. Patient c/o intermittent dizziness.               Multiple falls with recent fall this past Saturday, October 12th.               History of stroke which left him with numbness on the entire right               side. He denies any other cerebrovascular symptoms. Risk Factors: Hypertension, hyperlipidemia, Diabetes, past history of smoking,               prior MI, coronary artery disease, prior CVA. Performing Technologist: Sharlett Iles RVT  Examination Guidelines: A complete evaluation includes B-mode imaging, spectral Doppler, color Doppler, and power Doppler as needed of all accessible portions of each vessel. Bilateral testing is considered an integral part of a complete examination. Limited examinations for reoccurring indications may be performed as noted.  Right Carotid Findings: +----------+-------+-------+--------+------------------------+-----------------+           PSV    EDV    StenosisDescribe  Comments                    cm/s   cm/s                                                     +----------+-------+-------+--------+------------------------+-----------------+ CCA Prox  98     7                                      tortuous          +----------+-------+-------+--------+------------------------+-----------------+ CCA Distal75     15             heterogenous and        intimal                                           irregular               thickening        +----------+-------+-------+--------+------------------------+-----------------+ ICA Prox  265    70     60-79%  heterogenous and                                                          irregular                                 +----------+-------+-------+--------+------------------------+-----------------+ ICA Mid   91     25             heterogenous and        tortuous                                          irregular                                  +----------+-------+-------+--------+------------------------+-----------------+ ICA Distal85     31             heterogenous and                                                          irregular                                 +----------+-------+-------+--------+------------------------+-----------------+ ECA       75     16             heterogenous and  irregular                                 +----------+-------+-------+--------+------------------------+-----------------+ +----------+--------+-------+----------------+-------------------+           PSV cm/sEDV cmsDescribe        Arm Pressure (mmHG) +----------+--------+-------+----------------+-------------------+ [id number]            Multiphasic, WNL                    +----------+--------+-------+----------------+-------------------+ +---------+--------+--+--------+--+---------+ VertebralPSV cm/s42EDV cm/s10Antegrade +---------+--------+--+--------+--+---------+  Left Carotid Findings: +----------+-------+-------+--------+------------------------+-----------------+           PSV    EDV    StenosisDescribe                Comments                    cm/s   cm/s                                                     +----------+-------+-------+--------+------------------------+-----------------+ CCA Prox  65     8                                                        +----------+-------+-------+--------+------------------------+-----------------+ CCA Distal64     14                                     intimal                                                                   thickening        +----------+-------+-------+--------+------------------------+-----------------+ ICA Prox  84     22     1-39%   heterogenous and                                                          irregular                                  +----------+-------+-------+--------+------------------------+-----------------+ ICA Mid   76     23                                     tortuous          +----------+-------+-------+--------+------------------------+-----------------+ ICA Distal56     23  tortuous          +----------+-------+-------+--------+------------------------+-----------------+ ECA       237    11             heterogenous and                                                          irregular                                 +----------+-------+-------+--------+------------------------+-----------------+ +----------+--------+--------+----------------+-------------------+ SubclavianPSV cm/sEDV cm/sDescribe        Arm Pressure (mmHG) +----------+--------+--------+----------------+-------------------+           78              Multiphasic, WNL                    +----------+--------+--------+----------------+-------------------+ +---------+--------+--+--------+--+---------+ VertebralPSV cm/s41EDV cm/s14Antegrade +---------+--------+--+--------+--+---------+  Summary: Right Carotid: Velocities in the right ICA are consistent with a 60-79%                stenosis. Non-hemodynamically significant plaque <50% noted in                the CCA. The RICA velocities are elevated and have decreased                compared to the prior exam. Left Carotid: Velocities in the left ICA are consistent with a 1-39% stenosis.               The LICA velocities have decreased compared to the prior exam and               is now within normal range. Vertebrals:  Bilateral vertebral arteries demonstrate antegrade flow. Subclavians: Normal flow hemodynamics were seen in bilateral subclavian              arteries. *See table(s) above for measurements and observations. Suggest follow up study in 12 months. Electronically signed by Quay Burow MD on 12/31/2017 at 3:57:30 PM.     Final      Diagnostic Studies/Procedures    None performed this admission.   Disposition   Pt is being discharged home today in good condition.  Follow-up Plans & Appointments    Follow-up Information    Almyra Deforest, [place] Follow up on 02/04/2018.   Specialties:  Cardiology, Radiology Why:  Cardiology Hospital Follow-Up on 02/04/2018 at 8:30 AM with Almyra Deforest PA-C (works with Dr. Fletcher Anon).  Contact information: [address] [place] [address] [phone]          Discharge Instructions    Diet - low sodium heart healthy   Complete by:  As directed    Discharge instructions   Complete by:  As directed    Limit daily fluid intake to less than 2 Liters per day. Please limit salt intake.  Please weight yourself every morning. Call cardiology if weight increases by 3 pounds overnight or 5 pounds in a single week.   Increase activity slowly   Complete by:  As directed       Discharge Medications     Medication List    STOP taking these medications   amoxicillin-clavulanate 875-125 MG tablet Commonly known as:  AUGMENTIN  TAKE these medications   aspirin EC 81 MG tablet Take 81 mg by mouth daily.   atorvastatin 40 MG tablet Commonly known as:  LIPITOR Take 1 tablet (40 mg total) by mouth daily. What changed:  when to take this   benzonatate 200 MG capsule Commonly known as:  TESSALON Take 200 mg by mouth 3 (three) times daily.   carvedilol 3.125 MG tablet Commonly known as:  COREG Take 1 tablet (3.125 mg total) by mouth daily. What changed:  when to take this   clopidogrel 75 MG tablet Commonly known as:  PLAVIX Take 1 tablet (75 mg total) by mouth daily.   ferrous sulfate 325 (65 FE) MG tablet Take 325 mg by mouth daily with breakfast.   furosemide 20 MG tablet Commonly known as:  LASIX Take 2 tablets (40 mg total) by mouth daily. What changed:    how much to take  when to take this  reasons to take this   isosorbide  mononitrate 30 MG 24 hr tablet Commonly known as:  IMDUR Take 1 tablet (30 mg total) by mouth daily.   nitroGLYCERIN 0.4 MG SL tablet Commonly known as:  NITROSTAT Place 1 tablet (0.4 mg total) under the tongue every 5 (five) minutes x 3 doses as needed for chest pain.   sitaGLIPtin 50 MG tablet Commonly known as:  JANUVIA Take 1 tablet (50 mg total) by mouth daily.       Allergies No Known Allergies  Acute coronary syndrome (MI, NSTEMI, STEMI, etc) this admission?:  No.  The elevated Troponin was due to the acute medical illness or demand ischemia.   Outstanding Labs/Studies   BMET at Follow-up appointment.   Duration of Discharge Encounter   Greater than 30 minutes including physician time.  Signed, Erma Heritage, PA-C 01/27/2018, 9:37 AM

## 2018-01-30 ENCOUNTER — Other Ambulatory Visit: Payer: Self-pay

## 2018-01-30 ENCOUNTER — Inpatient Hospital Stay (HOSPITAL_COMMUNITY)
Admission: EM | Admit: 2018-01-30 | Discharge: 2018-02-17 | DRG: 194 | Disposition: E | Payer: Medicare Other | Attending: Internal Medicine | Admitting: Internal Medicine

## 2018-01-30 ENCOUNTER — Encounter (HOSPITAL_COMMUNITY): Payer: Self-pay | Admitting: *Deleted

## 2018-01-30 ENCOUNTER — Emergency Department (HOSPITAL_COMMUNITY): Payer: Medicare Other

## 2018-01-30 DIAGNOSIS — E1151 Type 2 diabetes mellitus with diabetic peripheral angiopathy without gangrene: Secondary | ICD-10-CM | POA: Diagnosis present

## 2018-01-30 DIAGNOSIS — I252 Old myocardial infarction: Secondary | ICD-10-CM | POA: Diagnosis not present

## 2018-01-30 DIAGNOSIS — C649 Malignant neoplasm of unspecified kidney, except renal pelvis: Secondary | ICD-10-CM | POA: Diagnosis present

## 2018-01-30 DIAGNOSIS — E872 Acidosis, unspecified: Secondary | ICD-10-CM | POA: Diagnosis present

## 2018-01-30 DIAGNOSIS — Z7902 Long term (current) use of antithrombotics/antiplatelets: Secondary | ICD-10-CM

## 2018-01-30 DIAGNOSIS — Z823 Family history of stroke: Secondary | ICD-10-CM

## 2018-01-30 DIAGNOSIS — I959 Hypotension, unspecified: Secondary | ICD-10-CM | POA: Diagnosis present

## 2018-01-30 DIAGNOSIS — J189 Pneumonia, unspecified organism: Principal | ICD-10-CM | POA: Diagnosis present

## 2018-01-30 DIAGNOSIS — Z806 Family history of leukemia: Secondary | ICD-10-CM

## 2018-01-30 DIAGNOSIS — E785 Hyperlipidemia, unspecified: Secondary | ICD-10-CM | POA: Diagnosis present

## 2018-01-30 DIAGNOSIS — R0602 Shortness of breath: Secondary | ICD-10-CM | POA: Diagnosis present

## 2018-01-30 DIAGNOSIS — Z7982 Long term (current) use of aspirin: Secondary | ICD-10-CM

## 2018-01-30 DIAGNOSIS — K759 Inflammatory liver disease, unspecified: Secondary | ICD-10-CM | POA: Diagnosis present

## 2018-01-30 DIAGNOSIS — D500 Iron deficiency anemia secondary to blood loss (chronic): Secondary | ICD-10-CM | POA: Diagnosis present

## 2018-01-30 DIAGNOSIS — G4700 Insomnia, unspecified: Secondary | ICD-10-CM | POA: Diagnosis present

## 2018-01-30 DIAGNOSIS — Y95 Nosocomial condition: Secondary | ICD-10-CM | POA: Diagnosis present

## 2018-01-30 DIAGNOSIS — I255 Ischemic cardiomyopathy: Secondary | ICD-10-CM | POA: Diagnosis present

## 2018-01-30 DIAGNOSIS — Z79899 Other long term (current) drug therapy: Secondary | ICD-10-CM

## 2018-01-30 DIAGNOSIS — Z8673 Personal history of transient ischemic attack (TIA), and cerebral infarction without residual deficits: Secondary | ICD-10-CM

## 2018-01-30 DIAGNOSIS — Z66 Do not resuscitate: Secondary | ICD-10-CM | POA: Diagnosis present

## 2018-01-30 DIAGNOSIS — E119 Type 2 diabetes mellitus without complications: Secondary | ICD-10-CM

## 2018-01-30 DIAGNOSIS — D649 Anemia, unspecified: Secondary | ICD-10-CM | POA: Diagnosis present

## 2018-01-30 DIAGNOSIS — N183 Chronic kidney disease, stage 3 unspecified: Secondary | ICD-10-CM | POA: Diagnosis present

## 2018-01-30 DIAGNOSIS — R54 Age-related physical debility: Secondary | ICD-10-CM | POA: Diagnosis present

## 2018-01-30 DIAGNOSIS — I2510 Atherosclerotic heart disease of native coronary artery without angina pectoris: Secondary | ICD-10-CM | POA: Diagnosis present

## 2018-01-30 DIAGNOSIS — C642 Malignant neoplasm of left kidney, except renal pelvis: Secondary | ICD-10-CM | POA: Diagnosis present

## 2018-01-30 DIAGNOSIS — D638 Anemia in other chronic diseases classified elsewhere: Secondary | ICD-10-CM | POA: Diagnosis present

## 2018-01-30 DIAGNOSIS — E1122 Type 2 diabetes mellitus with diabetic chronic kidney disease: Secondary | ICD-10-CM | POA: Diagnosis present

## 2018-01-30 DIAGNOSIS — I5084 End stage heart failure: Secondary | ICD-10-CM | POA: Diagnosis present

## 2018-01-30 DIAGNOSIS — R531 Weakness: Secondary | ICD-10-CM | POA: Diagnosis not present

## 2018-01-30 DIAGNOSIS — I25810 Atherosclerosis of coronary artery bypass graft(s) without angina pectoris: Secondary | ICD-10-CM | POA: Diagnosis present

## 2018-01-30 DIAGNOSIS — R451 Restlessness and agitation: Secondary | ICD-10-CM | POA: Diagnosis present

## 2018-01-30 DIAGNOSIS — K649 Unspecified hemorrhoids: Secondary | ICD-10-CM | POA: Diagnosis present

## 2018-01-30 DIAGNOSIS — I5042 Chronic combined systolic (congestive) and diastolic (congestive) heart failure: Secondary | ICD-10-CM | POA: Diagnosis present

## 2018-01-30 DIAGNOSIS — N179 Acute kidney failure, unspecified: Secondary | ICD-10-CM | POA: Diagnosis present

## 2018-01-30 DIAGNOSIS — Z515 Encounter for palliative care: Secondary | ICD-10-CM | POA: Diagnosis present

## 2018-01-30 DIAGNOSIS — Z87891 Personal history of nicotine dependence: Secondary | ICD-10-CM

## 2018-01-30 DIAGNOSIS — I472 Ventricular tachycardia: Secondary | ICD-10-CM | POA: Diagnosis present

## 2018-01-30 DIAGNOSIS — I25110 Atherosclerotic heart disease of native coronary artery with unstable angina pectoris: Secondary | ICD-10-CM | POA: Diagnosis not present

## 2018-01-30 DIAGNOSIS — Z8249 Family history of ischemic heart disease and other diseases of the circulatory system: Secondary | ICD-10-CM

## 2018-01-30 DIAGNOSIS — Z905 Acquired absence of kidney: Secondary | ICD-10-CM

## 2018-01-30 LAB — CBC WITH DIFFERENTIAL/PLATELET
ABS IMMATURE GRANULOCYTES: 0.04 10*3/uL (ref 0.00–0.07)
BASOS ABS: 0 10*3/uL (ref 0.0–0.1)
Basophils Relative: 0 %
Eosinophils Absolute: 0.1 10*3/uL (ref 0.0–0.5)
Eosinophils Relative: 2 %
HEMATOCRIT: 26.6 % — AB (ref 39.0–52.0)
Hemoglobin: 7.9 g/dL — ABNORMAL LOW (ref 13.0–17.0)
IMMATURE GRANULOCYTES: 1 %
LYMPHS PCT: 8 %
Lymphs Abs: 0.7 10*3/uL (ref 0.7–4.0)
MCH: 26.7 pg (ref 26.0–34.0)
MCHC: 29.7 g/dL — ABNORMAL LOW (ref 30.0–36.0)
MCV: 89.9 fL (ref 80.0–100.0)
MONO ABS: 1.2 10*3/uL — AB (ref 0.1–1.0)
MONOS PCT: 14 %
NEUTROS ABS: 6.4 10*3/uL (ref 1.7–7.7)
NEUTROS PCT: 75 %
PLATELETS: 243 10*3/uL (ref 150–400)
RBC: 2.96 MIL/uL — ABNORMAL LOW (ref 4.22–5.81)
RDW: 18.9 % — AB (ref 11.5–15.5)
WBC: 8.5 10*3/uL (ref 4.0–10.5)
nRBC: 0 % (ref 0.0–0.2)

## 2018-01-30 LAB — I-STAT CG4 LACTIC ACID, ED
Lactic Acid, Venous: 2.01 mmol/L (ref 0.5–1.9)
Lactic Acid, Venous: 1.67 mmol/L (ref 0.5–1.9)

## 2018-01-30 LAB — TROPONIN I: TROPONIN I: 0.11 ng/mL — AB (ref <0.03)

## 2018-01-30 LAB — COMPREHENSIVE METABOLIC PANEL
ALBUMIN: 2.5 g/dL — AB (ref 3.5–5.0)
ALT: 75 U/L — ABNORMAL HIGH (ref 0–44)
AST: 63 U/L — AB (ref 15–41)
Alkaline Phosphatase: 317 U/L — ABNORMAL HIGH (ref 38–126)
Anion gap: 12 (ref 5–15)
BUN: 37 mg/dL — ABNORMAL HIGH (ref 8–23)
CHLORIDE: 96 mmol/L — AB (ref 98–111)
CO2: 24 mmol/L (ref 22–32)
CREATININE: 1.98 mg/dL — AB (ref 0.61–1.24)
Calcium: 7.8 mg/dL — ABNORMAL LOW (ref 8.9–10.3)
GFR calc non Af Amer: 32 mL/min — ABNORMAL LOW (ref >60)
GFR, EST AFRICAN AMERICAN: 37 mL/min — AB (ref >60)
GLUCOSE: 338 mg/dL — AB (ref 70–99)
Potassium: 3.4 mmol/L — ABNORMAL LOW (ref 3.5–5.1)
SODIUM: 132 mmol/L — AB (ref 135–145)
Total Bilirubin: 1.3 mg/dL — ABNORMAL HIGH (ref 0.3–1.2)
Total Protein: 5.4 g/dL — ABNORMAL LOW (ref 6.5–8.1)

## 2018-01-30 LAB — GLUCOSE, CAPILLARY: Glucose-Capillary: 143 mg/dL — ABNORMAL HIGH (ref 70–99)

## 2018-01-30 LAB — POC OCCULT BLOOD, ED: Fecal Occult Bld: POSITIVE — AB

## 2018-01-30 LAB — BRAIN NATRIURETIC PEPTIDE: B Natriuretic Peptide: 2641.2 pg/mL — ABNORMAL HIGH (ref 0.0–100.0)

## 2018-01-30 LAB — PREPARE RBC (CROSSMATCH)

## 2018-01-30 MED ORDER — MIRTAZAPINE 15 MG PO TABS
7.5000 mg | ORAL_TABLET | Freq: Every day | ORAL | Status: DC
  Administered 2018-01-30: 7.5 mg via ORAL
  Filled 2018-01-30: qty 1

## 2018-01-30 MED ORDER — SODIUM CHLORIDE 0.9 % IV BOLUS (SEPSIS)
250.0000 mL | Freq: Once | INTRAVENOUS | Status: AC
  Administered 2018-01-30: 250 mL via INTRAVENOUS

## 2018-01-30 MED ORDER — VANCOMYCIN HCL 10 G IV SOLR
1500.0000 mg | Freq: Once | INTRAVENOUS | Status: AC
  Administered 2018-01-30: 1500 mg via INTRAVENOUS
  Filled 2018-01-30: qty 1500

## 2018-01-30 MED ORDER — INSULIN ASPART 100 UNIT/ML ~~LOC~~ SOLN
0.0000 [IU] | Freq: Three times a day (TID) | SUBCUTANEOUS | Status: DC
  Administered 2018-01-31: 2 [IU] via SUBCUTANEOUS
  Administered 2018-01-31: 3 [IU] via SUBCUTANEOUS

## 2018-01-30 MED ORDER — FUROSEMIDE 20 MG PO TABS
20.0000 mg | ORAL_TABLET | Freq: Every day | ORAL | Status: DC
  Administered 2018-01-30: 20 mg via ORAL
  Filled 2018-01-30: qty 1

## 2018-01-30 MED ORDER — SODIUM CHLORIDE 0.9 % IV SOLN
2.0000 g | Freq: Once | INTRAVENOUS | Status: AC
  Administered 2018-01-30: 2 g via INTRAVENOUS
  Filled 2018-01-30: qty 2

## 2018-01-30 MED ORDER — ATORVASTATIN CALCIUM 40 MG PO TABS
40.0000 mg | ORAL_TABLET | Freq: Every day | ORAL | Status: DC
  Administered 2018-01-30: 40 mg via ORAL
  Filled 2018-01-30: qty 1

## 2018-01-30 MED ORDER — SODIUM CHLORIDE 0.9 % IV SOLN
1.0000 g | INTRAVENOUS | Status: DC
  Administered 2018-01-31: 1 g via INTRAVENOUS
  Filled 2018-01-30 (×2): qty 1

## 2018-01-30 MED ORDER — BENZONATATE 100 MG PO CAPS
200.0000 mg | ORAL_CAPSULE | Freq: Three times a day (TID) | ORAL | Status: DC
  Administered 2018-01-30 – 2018-01-31 (×3): 200 mg via ORAL
  Filled 2018-01-30 (×3): qty 2

## 2018-01-30 MED ORDER — CLOPIDOGREL BISULFATE 75 MG PO TABS
75.0000 mg | ORAL_TABLET | Freq: Every day | ORAL | Status: DC
  Administered 2018-01-31: 75 mg via ORAL
  Filled 2018-01-30: qty 1

## 2018-01-30 MED ORDER — VANCOMYCIN HCL IN DEXTROSE 1-5 GM/200ML-% IV SOLN
1000.0000 mg | INTRAVENOUS | Status: DC
  Administered 2018-01-31: 1000 mg via INTRAVENOUS
  Filled 2018-01-30 (×2): qty 200

## 2018-01-30 MED ORDER — FUROSEMIDE 40 MG PO TABS
40.0000 mg | ORAL_TABLET | Freq: Every day | ORAL | Status: DC
  Administered 2018-01-31: 40 mg via ORAL
  Filled 2018-01-30: qty 1

## 2018-01-30 MED ORDER — SODIUM CHLORIDE 0.9 % IV BOLUS (SEPSIS): 500.0000 mL | Freq: Once | INTRAVENOUS | Status: DC

## 2018-01-30 MED ORDER — INSULIN ASPART 100 UNIT/ML ~~LOC~~ SOLN: 0.0000 [IU] | Freq: Every day | SUBCUTANEOUS | Status: DC

## 2018-01-30 MED ORDER — FERROUS SULFATE 325 (65 FE) MG PO TABS
325.0000 mg | ORAL_TABLET | Freq: Every day | ORAL | Status: DC
Start: 2018-01-31 — End: 2018-02-01
  Administered 2018-01-31: 325 mg via ORAL
  Filled 2018-01-30: qty 1

## 2018-01-30 MED ORDER — ASPIRIN EC 81 MG PO TBEC
81.0000 mg | DELAYED_RELEASE_TABLET | Freq: Every day | ORAL | Status: DC
  Administered 2018-01-31: 81 mg via ORAL
  Filled 2018-01-30: qty 1

## 2018-01-30 MED ORDER — SODIUM CHLORIDE 0.9 % IV SOLN
10.0000 mL/h | Freq: Once | INTRAVENOUS | Status: AC
  Administered 2018-01-31: 10 mL/h via INTRAVENOUS

## 2018-01-30 MED ORDER — NITROGLYCERIN 0.4 MG SL SUBL: 0.4000 mg | SUBLINGUAL_TABLET | SUBLINGUAL | Status: DC | PRN

## 2018-01-30 MED ORDER — ISOSORBIDE MONONITRATE ER 30 MG PO TB24
30.0000 mg | ORAL_TABLET | Freq: Every day | ORAL | Status: DC
  Administered 2018-01-31: 30 mg via ORAL
  Filled 2018-01-30: qty 1

## 2018-01-30 MED ORDER — CARVEDILOL 3.125 MG PO TABS
3.1250 mg | ORAL_TABLET | Freq: Every day | ORAL | Status: DC
  Administered 2018-01-31: 3.125 mg via ORAL
  Filled 2018-01-30: qty 1

## 2018-01-30 MED ORDER — ENOXAPARIN SODIUM 30 MG/0.3ML ~~LOC~~ SOLN
30.0000 mg | SUBCUTANEOUS | Status: DC
  Administered 2018-01-30 – 2018-01-31 (×2): 30 mg via SUBCUTANEOUS
  Filled 2018-01-30 (×2): qty 0.3

## 2018-01-30 NOTE — Progress Notes (Signed)
Pharmacy Antibiotic Note  Trevor [name] is a 72 y.o. male admitted on 01/23/2018 with sepsis.  Pharmacy has been consulted for vancomycin and cefepime dosing.  Concern for PNA - WBC WNL, LA 1.67, and afebrile. Received 1 times doses of cefepime and vancomycin. Scr 1.98 (CrCl 36 mL/min).   Plan: Order vancomycin 1 g IV every 24 hours  Order cefepime 1 g IV every 24 hours Monitor renal fx, cx results, clinical pic, and VT as necessary    Temp (24hrs), Avg:98.1 F (36.7 C), Min:97.5 F (36.4 C), Max:98.9 F (37.2 C)  Recent Labs  Lab 01/24/18 1647 01/25/18 0457 01/26/18 0459 01/18/2018 1310 02/10/2018 1316 02/05/2018 1508  WBC 9.8 9.8  --  8.5  --   --   CREATININE 1.80* 1.93* 1.89* 1.98*  --   --   LATICACIDVEN  --   --   --   --  2.01* 1.67    Estimated Creatinine Clearance: 36.3 mL/min (A) (by C-G formula based on SCr of 1.98 mg/dL (H)).    No Known Allergies  Antibiotics This Admission: Vancomycin 11/13>> Cefepime 11/13>>  Culture Results: 11/13 BCx: sent  Thank you for allowing pharmacy to be a part of this patient's care.  Doylene Canard, PharmD Clinical Pharmacist  Pager: [phone] Phone: [phone] 01/27/2018 8:49 PM

## 2018-01-30 NOTE — ED Notes (Signed)
Patient transported to X-ray 

## 2018-01-30 NOTE — ED Notes (Signed)
Patient denies pain and is resting comfortably.  

## 2018-01-30 NOTE — H&P (Signed)
vascular congestion is noted with increasing infiltrate particularly in the bases bilaterally when compare with the prior exam. Although this may represent worsening edema the possibility of superimposed infection deserves consideration. No sizable effusion is seen. No bony abnormality is noted. IMPRESSION: Increasing bibasilar infiltrates right greater than left likely representing superimposed pneumonia. Electronically Signed   By: Inez Catalina M.D.   On: 02/14/2018 14:00    EKG: Independently reviewed.  Sinus tachycardia rate 100 Multiple ventricular premature complexes Left atrial enlargement Repol abnrm, severe global ischemia (LM/MVD)  TTE 01/08/2018 - Left ventricle: The cavity size was mildly dilated. Wall   thickness was normal. Systolic function was severely reduced. The   estimated ejection fraction was in the range of 20% to 25%.   Severe diffuse hypokinesis with no identifiable regional   variations. Dyskinesis and scarring of the basal-mid anteroseptal   myocardium and relatively preserved apical contraction;   consistent with infarction in the distribution of the proximal   left anterior descending coronary artery with patent bypass to   the distal LAD. There was fusion of early and atrial   contributions to ventricular filling. Features are consistent   with a pseudonormal left ventricular filling pattern, with   concomitant abnormal relaxation and increased filling pressure   (grade 2 diastolic dysfunction). Acoustic contrast opacification   revealed no evidence ofthrombus. - Ventricular septum: Septal motion showed paradox. - Mitral valve: There was mild to moderate regurgitation directed   centrally. - Left atrium: The atrium was severely dilated. - Right ventricle: Systolic function was moderately reduced. - Right atrium: The atrium was mildly dilated. - Pulmonary arteries: PA peak pressure: 33 mm Hg (S).   Labs on Admission: I have  personally reviewed the available labs and imaging studies at the time of the admission.  Pertinent labs:  Sodium 132 potassium 3.4 chloride 96 CO2 24 glucose 338 BUN 37 creatinine 1.98 which is his baseline, calcium 7.8 alkaline phosphatase 317 albumin 2.5 AST 63 ALT 75 (LFTs are chronically elevated and at his recent baseline) total protein 5.4 total bilirubin 1.3 BNP 2641.2 Troponin 0 0.11 Lactic acid 2.01 which trended to 1.67 WBC 8.5 hemoglobin 7.9 with a recent baseline of eights to nines, MCV 89.9 platelets 243   Assessment/Plan Principal Problem:   Weakness generalized Active Problems:   Coronary artery disease   Ischemic cardiomyopathy   Hyperlipidemia   Iron deficiency anemia due to chronic blood loss   Anemia   Renal cell carcinoma (HCC)   History of CVA (cerebrovascular accident)   Chronic combined systolic and diastolic heart failure (HCC)   Diabetes mellitus type 2 in nonobese (HCC)   CKD (chronic kidney disease), stage III (HCC)   Lactic acidosis   HCAP (healthcare-associated pneumonia)   Insomnia  After in-depth interview, appears that pt's main complaint is generalized weakness [name] so than increased dyspnea. His cough is not productive and has not changed in several weeks. No fevers/chills. CXR findings of increased bibasilar infiltrates could be just due to pulmonary edema. BNP is elevated but actually lower than recent values. He is not hypoxic. His lactic acidosis is chronic and likely at least partly due to Type B lactic acidosis from his malignancy. It is downtrending and pt's vitals are stable. He is hypotensive but not [name] so than baseline and has normal mentation and UOP. I feel that the likelihood of PNA is low but he has already been started on HCAP abx. Will check procalcitonin and allow that to guide decision-making regarding  on file  Social History Narrative   Lives with sister.    No Known Allergies  Family History  Problem Relation Age of Onset  . Cancer Mother        leukemia  . Stroke Mother   . Cancer Maternal Grandfather        possible cancer  . Heart attack Brother 74  . CAD Brother     Prior to Admission medications   Medication Sig Start Date End Date Taking? Authorizing Provider  aspirin EC 81 MG tablet Take 81 mg by mouth daily.   Yes [provider]  atorvastatin (LIPITOR) 40 MG tablet Take 1 tablet (40 mg total) by mouth daily. Patient taking differently: Take 40 mg by mouth at bedtime.  05/20/13  Yes Wellington Hampshire, MD  benzonatate (TESSALON) 200 MG capsule Take 200 mg by mouth 3 (three) times daily.   Yes [provider]  carvedilol (COREG) 3.125 MG tablet Take 1 tablet (3.125 mg total) by mouth daily. 01/27/18  Yes Strader, Fransisco Hertz, PA-C  clopidogrel (PLAVIX) 75 MG tablet Take 1 tablet (75 mg total) by mouth daily. 01/10/18 02/09/18 Yes Donne Hazel, MD  ferrous sulfate 325 (65 FE) MG tablet Take 325 mg by mouth daily with breakfast.   Yes [provider]  furosemide (LASIX) 20 MG tablet Take 2 tablets (40 mg total) by mouth  daily. 01/27/18 01/27/19 Yes Strader, Fransisco Hertz, PA-C  isosorbide mononitrate (IMDUR) 30 MG 24 hr tablet Take 1 tablet (30 mg total) by mouth daily. 01/10/18 02/09/18 Yes Donne Hazel, MD  nitroGLYCERIN (NITROSTAT) 0.4 MG SL tablet Place 1 tablet (0.4 mg total) under the tongue every 5 (five) minutes x 3 doses as needed for chest pain. 01/27/18  Yes Strader, [place], PA-C  sitaGLIPtin (JANUVIA) 50 MG tablet Take 1 tablet (50 mg total) by mouth daily. 01/09/18 02/08/18 Yes Donne Hazel, MD    Physical Exam: Vitals:   02/02/2018 1500 01/21/2018 1545 02/11/2018 1548 02/04/2018 1600  BP: 107/75 (!) 88/64  (!) 89/54  Pulse:  (!) 103    Resp: (!) 24 (!) 23  14  Temp:   98.9 F (37.2 C)   TempSrc:   Rectal   SpO2:  100%  99%     . General: Pale. Appears calm and comfortable and is in NAD . Eyes:  PERRL, EOMI, normal lids, iris . ENT:  grossly normal hearing, lips & tongue, mmm; appropriate dentition . Neck:  no LAD, masses or thyromegaly; no carotid bruits . Cardiovascular:  tachy, reg rhythm, no murmur. Marland Kitchen Respiratory:  Normal respiratory effort. Appears to have mild respiratory distress after speaking for a while at times. CTAB except for bibasilar rales R>L . Abdomen:  soft, NT, ND, NABS . Back:   grossly normal alignment, no CVAT . Skin:  no rash or induration seen on limited exam . Musculoskeletal:  grossly normal tone BUE/BLE, good ROM, no bony abnormality or obvious joint deformity . Lower extremity:  1+ BLE edema.  Limited foot exam with no ulcerations.  2+ distal pulses. Marland Kitchen Psychiatric:  grossly normal mood and affect, speech fluent and appropriate, AOx3 . Neurologic:  CN 2-12 grossly intact, moves all extremities in coordinated fashion, sensation intact    Radiological Exams on Admission: Dg Chest 2 View  Result Date: 01/26/2018 CLINICAL DATA:  Shortness of Breath EXAM: CHEST - 2 VIEW COMPARISON:  01/24/2018 FINDINGS: Cardiac shadow is again enlarged. Postsurgical changes are  again seen. Persistent central  History and Physical    Siah Steely ZTI:[id number] DOB: [birth year] DOA: 01/25/2018  PCP: [name], MD  Consultants:  None Patient coming from: home- lives with sister and her husband  Chief Complaint: Generalized weakness  HPI: Trevor [name] is a 72 y.o. 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 His complaints are actually quite vague upon further questioning. He states that his shortness of breath as well as a dry cough have been present for several weeks and have not really acutely worsened. Rather, he states that he slept very poorly last night and when his sister saw him this morning she told him "you look terrible." Pt admits to feeling weaker than usual today but other than that he came to the ED upon his sister's urging. Pt was admitted last week for acute on chronic combined CHF and was diuresed. Prior to that he had been admitted and was given IVF which is the reason he was thought to have developed volume overload on his most recent admission. Pt states his usual weight is 165-170 lbs and that he has been on the lower end of that range recently, around 165 yesterday. He has not noticed any increased LE edema. His orthopnea is chronic and not acutely worse. He states that he "doesn't eat salt" but later describes eating chicken noodle soup, brun[place] stew and loaded baked potato soup. One of his biggest complaints is insomnia, which is described as difficulty falling asleep but also at times difficulty staying asleep. He has noticed he has been paler than usual; he  denies hematemesis, melena; he does have occasional mild blood streaks from his known hemorrhoids. He has not had any increased bruising/bleeding otherwise.  He denies fevers/chills.  His chemo for his RCC is on hold due to his cardiac issues. He states that his BP at home usually mid 82N to 05L systolic at baseline.   ED Course: Equal pulses in all extremities, mentating fine Worsened bilateral infiltrates, edema vs PNA Vanc, cefepime started Hgb 7.9 (8.5 3 days ago); one unit ordered  550 cc fluids given FOBT positive but pt has hemorrhoidal bleeding No melena, no hematochezia Never had colonoscopy DNR, undecided about intubation, leaning towards no intubation  Review of Systems: As per HPI; otherwise review of systems reviewed and negative.   Ambulatory Status:  Ambulates without assistance  Past Medical History:  Diagnosis Date  . 3-vessel coronary artery disease   . Anemia    2 iron infusions  . Arthritis   . Carotid disease, bilateral (HCC)    moderate  . Chronic systolic heart failure (HCC)    EF 45%  . Coronary artery disease    06/2012:STEMI s/p CABG; 8/18 NSTEMI  . Diabetes mellitus without complication ([place])   . Diarrhea   . Dyspnea    mild  . History of blood transfusion   . Hyperlipidemia   . Ischemic cardiomyopathy    Ejection fraction of 35-40% initially, 45 - 50% on echo 2014, 53% by perfusion study 2016  . Mild mitral regurgitation   . Numbness    Right side since stroke  . OA (osteoarthritis)   .  on file  Social History Narrative   Lives with sister.    No Known Allergies  Family History  Problem Relation Age of Onset  . Cancer Mother        leukemia  . Stroke Mother   . Cancer Maternal Grandfather        possible cancer  . Heart attack Brother 74  . CAD Brother     Prior to Admission medications   Medication Sig Start Date End Date Taking? Authorizing Provider  aspirin EC 81 MG tablet Take 81 mg by mouth daily.   Yes [provider]  atorvastatin (LIPITOR) 40 MG tablet Take 1 tablet (40 mg total) by mouth daily. Patient taking differently: Take 40 mg by mouth at bedtime.  05/20/13  Yes Wellington Hampshire, MD  benzonatate (TESSALON) 200 MG capsule Take 200 mg by mouth 3 (three) times daily.   Yes [provider]  carvedilol (COREG) 3.125 MG tablet Take 1 tablet (3.125 mg total) by mouth daily. 01/27/18  Yes Strader, Fransisco Hertz, PA-C  clopidogrel (PLAVIX) 75 MG tablet Take 1 tablet (75 mg total) by mouth daily. 01/10/18 02/09/18 Yes Donne Hazel, MD  ferrous sulfate 325 (65 FE) MG tablet Take 325 mg by mouth daily with breakfast.   Yes [provider]  furosemide (LASIX) 20 MG tablet Take 2 tablets (40 mg total) by mouth  daily. 01/27/18 01/27/19 Yes Strader, Fransisco Hertz, PA-C  isosorbide mononitrate (IMDUR) 30 MG 24 hr tablet Take 1 tablet (30 mg total) by mouth daily. 01/10/18 02/09/18 Yes Donne Hazel, MD  nitroGLYCERIN (NITROSTAT) 0.4 MG SL tablet Place 1 tablet (0.4 mg total) under the tongue every 5 (five) minutes x 3 doses as needed for chest pain. 01/27/18  Yes Strader, [place], PA-C  sitaGLIPtin (JANUVIA) 50 MG tablet Take 1 tablet (50 mg total) by mouth daily. 01/09/18 02/08/18 Yes Donne Hazel, MD    Physical Exam: Vitals:   02/02/2018 1500 01/21/2018 1545 02/11/2018 1548 02/04/2018 1600  BP: 107/75 (!) 88/64  (!) 89/54  Pulse:  (!) 103    Resp: (!) 24 (!) 23  14  Temp:   98.9 F (37.2 C)   TempSrc:   Rectal   SpO2:  100%  99%     . General: Pale. Appears calm and comfortable and is in NAD . Eyes:  PERRL, EOMI, normal lids, iris . ENT:  grossly normal hearing, lips & tongue, mmm; appropriate dentition . Neck:  no LAD, masses or thyromegaly; no carotid bruits . Cardiovascular:  tachy, reg rhythm, no murmur. Marland Kitchen Respiratory:  Normal respiratory effort. Appears to have mild respiratory distress after speaking for a while at times. CTAB except for bibasilar rales R>L . Abdomen:  soft, NT, ND, NABS . Back:   grossly normal alignment, no CVAT . Skin:  no rash or induration seen on limited exam . Musculoskeletal:  grossly normal tone BUE/BLE, good ROM, no bony abnormality or obvious joint deformity . Lower extremity:  1+ BLE edema.  Limited foot exam with no ulcerations.  2+ distal pulses. Marland Kitchen Psychiatric:  grossly normal mood and affect, speech fluent and appropriate, AOx3 . Neurologic:  CN 2-12 grossly intact, moves all extremities in coordinated fashion, sensation intact    Radiological Exams on Admission: Dg Chest 2 View  Result Date: 01/26/2018 CLINICAL DATA:  Shortness of Breath EXAM: CHEST - 2 VIEW COMPARISON:  01/24/2018 FINDINGS: Cardiac shadow is again enlarged. Postsurgical changes are  again seen. Persistent central  on file  Social History Narrative   Lives with sister.    No Known Allergies  Family History  Problem Relation Age of Onset  . Cancer Mother        leukemia  . Stroke Mother   . Cancer Maternal Grandfather        possible cancer  . Heart attack Brother 74  . CAD Brother     Prior to Admission medications   Medication Sig Start Date End Date Taking? Authorizing Provider  aspirin EC 81 MG tablet Take 81 mg by mouth daily.   Yes [provider]  atorvastatin (LIPITOR) 40 MG tablet Take 1 tablet (40 mg total) by mouth daily. Patient taking differently: Take 40 mg by mouth at bedtime.  05/20/13  Yes Wellington Hampshire, MD  benzonatate (TESSALON) 200 MG capsule Take 200 mg by mouth 3 (three) times daily.   Yes [provider]  carvedilol (COREG) 3.125 MG tablet Take 1 tablet (3.125 mg total) by mouth daily. 01/27/18  Yes Strader, Fransisco Hertz, PA-C  clopidogrel (PLAVIX) 75 MG tablet Take 1 tablet (75 mg total) by mouth daily. 01/10/18 02/09/18 Yes Donne Hazel, MD  ferrous sulfate 325 (65 FE) MG tablet Take 325 mg by mouth daily with breakfast.   Yes [provider]  furosemide (LASIX) 20 MG tablet Take 2 tablets (40 mg total) by mouth  daily. 01/27/18 01/27/19 Yes Strader, Fransisco Hertz, PA-C  isosorbide mononitrate (IMDUR) 30 MG 24 hr tablet Take 1 tablet (30 mg total) by mouth daily. 01/10/18 02/09/18 Yes Donne Hazel, MD  nitroGLYCERIN (NITROSTAT) 0.4 MG SL tablet Place 1 tablet (0.4 mg total) under the tongue every 5 (five) minutes x 3 doses as needed for chest pain. 01/27/18  Yes Strader, [place], PA-C  sitaGLIPtin (JANUVIA) 50 MG tablet Take 1 tablet (50 mg total) by mouth daily. 01/09/18 02/08/18 Yes Donne Hazel, MD    Physical Exam: Vitals:   02/02/2018 1500 01/21/2018 1545 02/11/2018 1548 02/04/2018 1600  BP: 107/75 (!) 88/64  (!) 89/54  Pulse:  (!) 103    Resp: (!) 24 (!) 23  14  Temp:   98.9 F (37.2 C)   TempSrc:   Rectal   SpO2:  100%  99%     . General: Pale. Appears calm and comfortable and is in NAD . Eyes:  PERRL, EOMI, normal lids, iris . ENT:  grossly normal hearing, lips & tongue, mmm; appropriate dentition . Neck:  no LAD, masses or thyromegaly; no carotid bruits . Cardiovascular:  tachy, reg rhythm, no murmur. Marland Kitchen Respiratory:  Normal respiratory effort. Appears to have mild respiratory distress after speaking for a while at times. CTAB except for bibasilar rales R>L . Abdomen:  soft, NT, ND, NABS . Back:   grossly normal alignment, no CVAT . Skin:  no rash or induration seen on limited exam . Musculoskeletal:  grossly normal tone BUE/BLE, good ROM, no bony abnormality or obvious joint deformity . Lower extremity:  1+ BLE edema.  Limited foot exam with no ulcerations.  2+ distal pulses. Marland Kitchen Psychiatric:  grossly normal mood and affect, speech fluent and appropriate, AOx3 . Neurologic:  CN 2-12 grossly intact, moves all extremities in coordinated fashion, sensation intact    Radiological Exams on Admission: Dg Chest 2 View  Result Date: 01/26/2018 CLINICAL DATA:  Shortness of Breath EXAM: CHEST - 2 VIEW COMPARISON:  01/24/2018 FINDINGS: Cardiac shadow is again enlarged. Postsurgical changes are  again seen. Persistent central

## 2018-01-30 NOTE — Progress Notes (Signed)
Pharmacy Antibiotic Note  Flay Ghosh is a 72 y.o. male admitted on 01/24/2018 with sepsis.  Pharmacy has been consulted for vancomycin and cefepime dosing.  Plan: Give vancomycin 1.5g IV x 1, then start vancomycin Give cefepime 2g IV x 1 F/U new SCr for further dosing.    Temp (24hrs), Avg:98.5 F (36.9 C), Min:98.5 F (36.9 C), Max:98.5 F (36.9 C)  Recent Labs  Lab 01/24/18 1647 01/25/18 0457 01/26/18 0459 02/08/2018 1316  WBC 9.8 9.8  --   --   CREATININE 1.80* 1.93* 1.89*  --   LATICACIDVEN  --   --   --  2.01*    Estimated Creatinine Clearance: 38.1 mL/min (A) (by C-G formula based on SCr of 1.89 mg/dL (H)).    No Known Allergies  Thank you for allowing pharmacy to be a part of this patient's care.  Reginia Naas 01/30/2018 2:01 PM

## 2018-01-30 NOTE — ED Notes (Signed)
Delay in lab draw, pt receiving blood product

## 2018-01-30 NOTE — ED Provider Notes (Signed)
[facility]PART[facility] Provider Note   CSN: [id number] Arrival date & time: 01/24/2018  1244     History   Chief Complaint Chief Complaint  Patient presents with  . Weakness  . Shortness of Breath    HPI Trevor [name] is a 72 y.o. male.  HPI  Patient is a 72 year old male with a history of CAD, chronic systolic heart failure, T2 DM, TIA, renal cell carcinom (status post nephrectomy) presenting for shortness of breath.  Patient was discharged from the hospital 3 days ago for CHF exacerbation, and he reports that he has had continued shortness of breath is worsening with the past 3 days.  He reports that he become short of breath with simple tasks like going to the bathroom.  He reports that he cannot sleep laying down and often has to sit upright to get more air.  He reports that he has a dry cough, but cannot produce sputum.  Denies hemoptysis.  Patient denies any chest pain.  Patient denies any fevers, chills, diaphoresis, or night sweats.  Patient denies any nausea, vomiting, diarrhea.  Patient reports he has been taking his daily dose of Lasix.  Patient reports that he has been adhering to his sodium restricted diet.  Past Medical History:  Diagnosis Date  . 3-vessel coronary artery disease   . Anemia    2 iron infusions  . Arthritis   . Carotid disease, bilateral (HCC)    moderate  . Chronic systolic heart failure (HCC)    EF 45%  . Coronary artery disease    06/2012:STEMI s/p CABG; 8/18 NSTEMI  . Diabetes mellitus without complication ([place])   . Diarrhea   . Dyspnea    mild  . History of blood transfusion   . Hyperlipidemia   . Ischemic cardiomyopathy    Ejection fraction of 35-40% initially, 45 - 50% on echo 2014, 53% by perfusion study 2016  . Mild mitral regurgitation   . Numbness    Right side since stroke  . OA (osteoarthritis)   . PAD (peripheral artery disease) ([place])   . Pulmonary nodule   . 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 Stroke ([place]) 2018  . TIA (transient ischemic attack) 10/2016  . Weakness generalized   . Wears glasses     Patient Active Problem List   Diagnosis Date Noted  . Acute on chronic combined systolic and diastolic CHF (congestive heart failure) ([place]) 01/25/2018  . Acute systolic (congestive) heart failure ([place]) 01/24/2018  . Acute on chronic combined systolic (congestive) and diastolic (congestive) heart failure ([place]) 01/14/2018  . CHF (congestive heart failure) ([place]) 01/13/2018  . CKD (chronic kidney disease), stage III ([place]) 01/08/2018  . Hyperkalemia 01/08/2018  . DKA (diabetic ketoacidoses) ([place]) 01/08/2018  . Lactic acidosis   . Drug-induced hepatitis 01/02/2018  . Hypertension 11/01/2017  . Encounter for antineoplastic immunotherapy 09/06/2017  . Goals of care, counseling/discussion 07/04/2017  . Malnutrition of moderate degree 04/19/2017  . Colitis 04/19/2017  . Acute kidney injury ([place]) 04/17/2017  . History of CVA (cerebrovascular accident) 04/17/2017  . Severe diarrhea 04/17/2017  . 3-vessel coronary artery disease 04/17/2017  . Chronic combined systolic and diastolic heart failure ([place]) 04/17/2017  . Diabetes mellitus type 2 in nonobese ([place]) 04/17/2017  . Chronic systolic heart failure ([place])   . Dehydration 04/11/2017  . Nausea without vomiting 04/11/2017  . Elevated LFTs 04/11/2017  . Encounter for antineoplastic chemotherapy 02/05/2017  .  Renal cell cancer ([place]) 01/18/2017  . Renal cell carcinoma ([place]) 01/15/2017  . Renal cell cancer, left ([place]) 12/18/2016  . Renal mass 12/06/2016  . 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 NSTEMI (non-ST elevated myocardial infarction) ([place])   . Anemia 11/11/2016  . TIA (transient ischemic attack) 10/18/2016  . Iron  deficiency anemia due to chronic blood loss 09/20/2015  . Recent weight loss 09/20/2015  . Bilateral leg pain 02/21/2015  . Coronary artery disease   . Ischemic cardiomyopathy   . Hyperlipidemia   . ST elevation myocardial infarction (STEMI) of anterior wall, initial episode of care ([place]) 07/04/2012  . Hyperglycemia 07/04/2012    Past Surgical History:  Procedure Laterality Date  . CARDIOVASCULAR STRESS TEST    . CORONARY ARTERY BYPASS GRAFT N/A 07/05/2012   Procedure: CORONARY ARTERY BYPASS GRAFTING (CABG);  Surgeon: Ivin Poot, MD;  Location: [facility];  Service: Open Heart Surgery;  Laterality: N/A;  . INTRAOPERATIVE TRANSESOPHAGEAL ECHOCARDIOGRAM N/A 07/05/2012   Procedure: INTRAOPERATIVE TRANSESOPHAGEAL ECHOCARDIOGRAM;  Surgeon: Ivin Poot, MD;  Location: [facility];  Service: Open Heart Surgery;  Laterality: N/A;  . LEFT HEART CATH AND CORS/GRAFTS ANGIOGRAPHY N/A 11/14/2016   Procedure: LEFT HEART CATH AND CORS/GRAFTS ANGIOGRAPHY;  Surgeon: Belva Crome, MD;  Location: [facility];  Service: Cardiovascular;  Laterality: N/A;  . LEFT HEART CATH AND CORS/GRAFTS ANGIOGRAPHY N/A 01/08/2018   Procedure: LEFT HEART CATH AND CORS/GRAFTS ANGIOGRAPHY;  Surgeon: Martinique, Peter M, MD;  Location: [facility];  Service: Cardiovascular;  Laterality: N/A;  . LEFT HEART CATHETERIZATION WITH CORONARY ANGIOGRAM N/A 07/04/2012   Procedure: LEFT HEART CATHETERIZATION WITH CORONARY ANGIOGRAM;  Surgeon: Wellington Hampshire, MD;  Location: [facility];  Service: Cardiovascular;  Laterality: N/A;  . RENAL BIOPSY    . ROBOT ASSISTED LAPAROSCOPIC NEPHRECTOMY Left 02/21/2017   Procedure: XI ROBOTIC ASSISTED LAPAROSCOPIC NEPHRECTOMY;  Surgeon: Alexis Frock, MD;  Location: [facility];  Service: Urology;  Laterality: Left;        Home Medications    Prior to Admission medications   Medication Sig Start Date End Date Taking? Authorizing Provider  aspirin EC 81 MG tablet Take 81 mg by mouth daily.     [provider]  atorvastatin (LIPITOR) 40 MG tablet Take 1 tablet (40 mg total) by mouth daily. Patient taking differently: Take 40 mg by mouth at bedtime.  05/20/13   Wellington Hampshire, MD  benzonatate (TESSALON) 200 MG capsule Take 200 mg by mouth 3 (three) times daily.    [provider]  carvedilol (COREG) 3.125 MG tablet Take 1 tablet (3.125 mg total) by mouth daily. 01/27/18   Strader, Fransisco Hertz, PA-C  clopidogrel (PLAVIX) 75 MG tablet Take 1 tablet (75 mg total) by mouth daily. 01/10/18 02/09/18  Donne Hazel, MD  ferrous sulfate 325 (65 FE) MG tablet Take 325 mg by mouth daily with breakfast.    [provider]  furosemide (LASIX) 20 MG tablet Take 2 tablets (40 mg total) by mouth daily. 01/27/18 01/27/19  Strader, Fransisco Hertz, PA-C  isosorbide mononitrate (IMDUR) 30 MG 24 hr tablet Take 1 tablet (30 mg total) by mouth daily. 01/10/18 02/09/18  Donne Hazel, MD  nitroGLYCERIN (NITROSTAT) 0.4 MG SL tablet Place 1 tablet (0.4 mg total) under the tongue every 5 (five) minutes x 3  doses as needed for chest pain. 01/27/18   Strader, Fransisco Hertz, PA-C  sitaGLIPtin (JANUVIA) 50 MG tablet Take 1 tablet (50 mg total) by mouth daily. 01/09/18 02/08/18  Donne Hazel, MD    Family History Family History  Problem Relation Age of Onset  . Cancer Mother        leukemia  . Stroke Mother   . Cancer Maternal Grandfather        possible cancer  . Heart attack Brother 57  . CAD Brother     Social History Social History   Tobacco Use  . Smoking status: Former Smoker    Packs/day: 1.00    Years: 50.00    Pack years: 50.00    Last attempt to quit: 04/20/2012    Years since quitting: 5.7  . Smokeless tobacco: Never Used  Substance Use Topics  . Alcohol use: No  . Drug use: No     Allergies   Patient has no known allergies.   Review of Systems Review of Systems  Constitutional: Negative for chills and fever.  HENT: Negative for congestion and rhinorrhea.    Respiratory: Positive for cough and shortness of breath. Negative for chest tightness.   Gastrointestinal: Negative for abdominal pain, nausea and vomiting.  Genitourinary: Negative for dysuria, frequency and urgency.  Musculoskeletal: Negative for arthralgias and myalgias.  Skin: Negative for rash.  Neurological: Negative for weakness and numbness.  All other systems reviewed and are negative.    Physical Exam Updated Vital Signs BP 107/75   Pulse 69   Temp 98.9 F (37.2 C) (Rectal)   Resp (!) 24   SpO2 98%   Physical Exam  Constitutional: He appears well-developed and well-nourished.  Chronically ill appearing.   HENT:  Head: Normocephalic and atraumatic.  Mouth/Throat: Oropharynx is clear and moist.  Eyes: Pupils are equal, round, and reactive to light. Conjunctivae and EOM are normal.  Pale conjunctiva.   Neck: Normal range of motion. Neck supple.  Cardiovascular: Normal rate, regular rhythm, S1 normal and S2 normal.  No murmur heard. Pulmonary/Chest: Effort normal. He has decreased breath sounds. He has no wheezes. He has rales in the right middle field, the left middle field and the left lower field.  Abdominal: Soft. He exhibits no distension. There is no tenderness. There is no guarding.  Genitourinary:  Genitourinary Comments: Rectal examination performed with EMT chaperone present.  Patient has nonthrombosed external hemorrhoids.  Patient has internal hemorrhoids.  There is no is hemorrhoidal bleeding present, but there is no melena or hematochezia type stool.  Musculoskeletal: Normal range of motion. He exhibits no edema or deformity.  Lymphadenopathy:    He has no cervical adenopathy.  Neurological: He is alert.  Cranial nerves grossly intact. Patient moves extremities symmetrically and with good coordination.  Skin: Skin is warm and dry. No rash noted. No erythema.  Psychiatric: He has a normal mood and affect. His behavior is normal. Judgment and thought  content normal.  Nursing note and vitals reviewed.    ED Treatments / Results  Labs (all labs ordered are listed, but only abnormal results are displayed) Labs Reviewed  COMPREHENSIVE METABOLIC PANEL - Abnormal; Notable for the following components:      Result Value   Sodium 132 (*)    Potassium 3.4 (*)    Chloride 96 (*)    Glucose, Bld 338 (*)    BUN 37 (*)    Creatinine, Ser 1.98 (*)    Calcium 7.8 (*)  Total Protein 5.4 (*)    Albumin 2.5 (*)    AST 63 (*)    ALT 75 (*)    Alkaline Phosphatase 317 (*)    Total Bilirubin 1.3 (*)    GFR calc non Af Amer 32 (*)    GFR calc Af Amer 37 (*)    All other components within normal limits  TROPONIN I - Abnormal; Notable for the following components:   Troponin I 0.11 (*)    All other components within normal limits  CBC WITH DIFFERENTIAL/PLATELET - Abnormal; Notable for the following components:   RBC 2.96 (*)    Hemoglobin 7.9 (*)    HCT 26.6 (*)    MCHC 29.7 (*)    RDW 18.9 (*)    Monocytes Absolute 1.2 (*)    All other components within normal limits  I-STAT CG4 LACTIC ACID, ED - Abnormal; Notable for the following components:   Lactic Acid, Venous 2.01 (*)    All other components within normal limits  POC OCCULT BLOOD, ED - Abnormal; Notable for the following components:   Fecal Occult Bld POSITIVE (*)    All other components within normal limits  CULTURE, BLOOD (ROUTINE X 2)  CULTURE, BLOOD (ROUTINE X 2)  BRAIN NATRIURETIC PEPTIDE  URINALYSIS, ROUTINE W REFLEX MICROSCOPIC  I-STAT CG4 LACTIC ACID, ED  PREPARE RBC (CROSSMATCH)  TYPE AND SCREEN    EKG EKG Interpretation  Date/Time:  Wednesday January 30 2018 12:50:46 EST Ventricular Rate:  100 PR Interval:    QRS Duration: 88 QT Interval:  379 QTC Calculation: 489 R Axis:   49 Text Interpretation:  Sinus tachycardia Multiple ventricular premature complexes Left atrial enlargement Repol abnrm, severe global ischemia (LM/MVD) No significant change since  last tracing Confirmed by Duffy Bruce [phone]) on 02/01/2018 1:52:15 PM   Radiology Dg Chest 2 View  Result Date: 01/25/2018 CLINICAL DATA:  Shortness of Breath EXAM: CHEST - 2 VIEW COMPARISON:  01/24/2018 FINDINGS: Cardiac shadow is again enlarged. Postsurgical changes are again seen. Persistent central vascular congestion is noted with increasing infiltrate particularly in the bases bilaterally when compare with the prior exam. Although this may represent worsening edema the possibility of superimposed infection deserves consideration. No sizable effusion is seen. No bony abnormality is noted. IMPRESSION: Increasing bibasilar infiltrates right greater than left likely representing superimposed pneumonia. Electronically Signed   By: Inez Catalina M.D.   On: 02/06/2018 14:00    Procedures Procedures (including critical care time)  CRITICAL CARE Performed by: Albesa Seen   Total critical care time: 35 minutes  Critical care time was exclusive of separately billable procedures and treating other patients.  Critical care was necessary to treat or prevent imminent or life-threatening deterioration.  Critical care was time spent personally by me on the following activities: development of treatment plan with patient and/or surrogate as well as nursing, discussions with consultants, evaluation of patient's response to treatment, examination of patient, obtaining history from patient or surrogate, ordering and performing treatments and interventions, ordering and review of laboratory studies, ordering and review of radiographic studies, pulse oximetry and re-evaluation of patient's condition.   Medications Ordered in ED Medications  vancomycin (VANCOCIN) 1,500 mg in sodium chloride 0.9 % 500 mL IVPB (1,500 mg Intravenous New Bag/Given 01/29/2018 1452)  0.9 %  sodium chloride infusion (has no administration in time range)  sodium chloride 0.9 % bolus 250 mL (250 mLs Intravenous New  Bag/Given 01/22/2018 1446)  ceFEPIme (MAXIPIME) 2 g in sodium chloride  0.9 % 100 mL IVPB (0 g Intravenous Stopped 02/04/2018 1526)     Initial Impression / Assessment and Plan / ED Course  I have reviewed the triage vital signs and the nursing notes.  Pertinent labs & imaging results that were available during my care of the patient were reviewed by me and considered in my medical decision making (see chart for details).  Clinical Course as of Jan 30 1742  Wed Jan 30, 2018  1310 Lactic Acid, Venous(!!): 2.01 [AM]  1538 Will transfuse. Discussed with attending physician Dr. Duffy Bruce.   Hemoglobin(!): 7.9 [AM]  1558 Last checked on 10/28. Pt had NSTEMI.  Troponin I(!!): 0.11 [AM]  1749 Spoke with Dr. Steffanie Dunn of [facility] who will admit patient. I appreciate her involvement in the care of this patient.    [AM]    Clinical Course User Index [AM] Albesa Seen, PA-C    Patient is critically ill and requiring a higher level of care. Sepsis suspected. Code sepsis called. Patient meeting SIRS criteria based on tachypnea, tachycardia, BP. See vitals below. Suspected source of infection pulmonary based on exam and CXR. Lactic acid 2.01 initially.    Patient takes daily blood pressures, and reports that his blood pressures are in the mid 80s daily. Most recent discharge summary with soft BP 91/64.  Patient mentating well with normal perfusion in all extremities on initial exam with blood pressure in the mid 44H systolic.  Vitals:   02/08/2018 1415 01/24/2018 1445 01/25/2018 1500 02/04/2018 1548  BP: 91/66 90/65 107/75   Pulse: (!) 103 69    Resp: (!) 24 (!) 21 (!) 24   Temp:    98.9 F (37.2 C)  TempSrc:    Rectal  SpO2: 94% 98%       Broad spectrum antibiotics initiated based on suspected source of infection.  Patient was given soft fluid repletion, and 30 cc/kg bolus was not administered due to patient's current blood pressure on arrival consistent with his stable chronic blood  pressures.  Patient takes daily blood pressures and they are in the systolic mid 67R to low 91M.  Patient is mentating well and perfusing his extremities.  Patient is also receiving fluids with his blood transfusion.  Lab work otherwise significant for hemoglobin was 7.9.  This is down from his previous labs 3 days ago.  Given patient's presentation, transfusion strategy discussed with attending physician Dr. Duffy Bruce, and felt that he is best transfused at a threshold of 8.  Suspect this is chronic given review of the chart.  Patient had small amount of hemorrhoidal bleeding, but no evidence of melena or hematochezia on exam.  Patient has never had a colonoscopy, as he states that he does not wish to receive this at any point.  Patient has elevated troponin of 0.11.  Patient has significantly elevated troponin in the setting of demand ischemia in October 2019.  He had critical 3 vessel obstructive CAD, but no interventions were pursued due to no good options for PCI.  Sepsis - Repeat Assessment  Performed at:    1551  Vitals     Blood pressure 107/75, pulse 69, temperature 98.9 F (37.2 C), temperature source Rectal, resp. rate (!) 24, SpO2 98 %.  Heart:     Regular rate and rhythm  Lungs:    Rales, b/l. C/w prior exam.   Capillary Refill:   <2 sec  Peripheral Pulse:   Dorsalis pedis pulse  palpable  Skin:  Normal Color   Spoke with hospitalist Dr. Steffanie Dunn who has decided to admit patient to a high level of care.   This is a shared visit with Dr. Duffy Bruce. Patient was independently evaluated by this attending physician. Attending physician consulted in evaluation and management.   Final Clinical Impressions(s) / ED Diagnoses   Final diagnoses:  Healthcare-associated pneumonia  Symptomatic anemia    ED Discharge Orders    None       Tamala Julian 01/29/2018 1749    Duffy Bruce, MD 02/01/18 2000

## 2018-01-30 NOTE — ED Triage Notes (Signed)
Pt in c/o generalized weakness and SOB  Onset today @ 8am with non productive cough, afebrile, pt BP 90/58 HR 100, seen last week with dx of CHF, pt reports feeling weaker today, pt rcvd 300 mL NS pta, A&O x4

## 2018-01-30 NOTE — ED Notes (Signed)
ED Provider at bedside. 

## 2018-01-31 ENCOUNTER — Inpatient Hospital Stay (HOSPITAL_COMMUNITY): Payer: Medicare Other

## 2018-01-31 DIAGNOSIS — I5042 Chronic combined systolic (congestive) and diastolic (congestive) heart failure: Secondary | ICD-10-CM

## 2018-01-31 DIAGNOSIS — I472 Ventricular tachycardia: Secondary | ICD-10-CM

## 2018-01-31 DIAGNOSIS — I25110 Atherosclerotic heart disease of native coronary artery with unstable angina pectoris: Secondary | ICD-10-CM

## 2018-01-31 DIAGNOSIS — Z515 Encounter for palliative care: Secondary | ICD-10-CM

## 2018-01-31 DIAGNOSIS — R531 Weakness: Secondary | ICD-10-CM

## 2018-01-31 LAB — CBC
HCT: 30.7 % — ABNORMAL LOW (ref 39.0–52.0)
Hemoglobin: 9.2 g/dL — ABNORMAL LOW (ref 13.0–17.0)
MCH: 26.5 pg (ref 26.0–34.0)
MCHC: 30 g/dL (ref 30.0–36.0)
MCV: 88.5 fL (ref 80.0–100.0)
Platelets: 274 10*3/uL (ref 150–400)
RBC: 3.47 MIL/uL — ABNORMAL LOW (ref 4.22–5.81)
RDW: 18 % — ABNORMAL HIGH (ref 11.5–15.5)
WBC: 10.4 10*3/uL (ref 4.0–10.5)
nRBC: 0 % (ref 0.0–0.2)

## 2018-01-31 LAB — BASIC METABOLIC PANEL
Anion gap: 12 (ref 5–15)
BUN: 37 mg/dL — ABNORMAL HIGH (ref 8–23)
CO2: 24 mmol/L (ref 22–32)
Calcium: 8.2 mg/dL — ABNORMAL LOW (ref 8.9–10.3)
Chloride: 99 mmol/L (ref 98–111)
Creatinine, Ser: 2.02 mg/dL — ABNORMAL HIGH (ref 0.61–1.24)
GFR calc Af Amer: 36 mL/min — ABNORMAL LOW (ref >60)
GFR calc non Af Amer: 31 mL/min — ABNORMAL LOW (ref >60)
Glucose, Bld: 154 mg/dL — ABNORMAL HIGH (ref 70–99)
Potassium: 3.3 mmol/L — ABNORMAL LOW (ref 3.5–5.1)
Sodium: 135 mmol/L (ref 135–145)

## 2018-01-31 LAB — TYPE AND SCREEN
ABO/RH(D): O POS
Antibody Screen: NEGATIVE
Unit division: 0

## 2018-01-31 LAB — URINALYSIS, ROUTINE W REFLEX MICROSCOPIC
Bilirubin Urine: NEGATIVE
Glucose, UA: NEGATIVE mg/dL
Hgb urine dipstick: NEGATIVE
Ketones, ur: 5 mg/dL — AB
Leukocytes, UA: NEGATIVE
Nitrite: NEGATIVE
Protein, ur: NEGATIVE mg/dL
Specific Gravity, Urine: 1.021 (ref 1.005–1.030)
pH: 5 (ref 5.0–8.0)

## 2018-01-31 LAB — PROCALCITONIN
Procalcitonin: 0.2 ng/mL
Procalcitonin: 0.2 ng/mL

## 2018-01-31 LAB — BPAM RBC
BLOOD PRODUCT EXPIRATION DATE: 201912112359
ISSUE DATE / TIME: 201911131731
Unit Type and Rh: 5100

## 2018-01-31 LAB — GLUCOSE, CAPILLARY
Glucose-Capillary: 146 mg/dL — ABNORMAL HIGH (ref 70–99)
Glucose-Capillary: 162 mg/dL — ABNORMAL HIGH (ref 70–99)
Glucose-Capillary: 156 mg/dL — ABNORMAL HIGH (ref 70–99)
Glucose-Capillary: 178 mg/dL — ABNORMAL HIGH (ref 70–99)

## 2018-01-31 LAB — STREP PNEUMONIAE URINARY ANTIGEN: Strep Pneumo Urinary Antigen: NEGATIVE

## 2018-01-31 LAB — HIV ANTIBODY (ROUTINE TESTING W REFLEX): HIV Screen 4th Generation wRfx: NONREACTIVE

## 2018-01-31 LAB — TROPONIN I
Troponin I: 0.11 ng/mL (ref <0.03)
Troponin I: 0.11 ng/mL (ref <0.03)

## 2018-01-31 LAB — MRSA PCR SCREENING: MRSA by PCR: NEGATIVE

## 2018-01-31 LAB — BRAIN NATRIURETIC PEPTIDE: B Natriuretic Peptide: 2739.3 pg/mL — ABNORMAL HIGH (ref 0.0–100.0)

## 2018-01-31 LAB — MAGNESIUM: Magnesium: 2.1 mg/dL (ref 1.7–2.4)

## 2018-01-31 MED ORDER — POTASSIUM CHLORIDE CRYS ER 20 MEQ PO TBCR
40.0000 meq | EXTENDED_RELEASE_TABLET | Freq: Two times a day (BID) | ORAL | Status: DC
  Administered 2018-01-31: 40 meq via ORAL
  Filled 2018-01-31: qty 2

## 2018-01-31 MED ORDER — AMIODARONE LOAD VIA INFUSION
150.0000 mg | Freq: Once | INTRAVENOUS | Status: AC
  Administered 2018-01-31: 150 mg via INTRAVENOUS
  Filled 2018-01-31: qty 83.34

## 2018-01-31 MED ORDER — AMIODARONE HCL IN DEXTROSE 360-4.14 MG/200ML-% IV SOLN: 30.0000 mg/h | INTRAVENOUS | Status: DC

## 2018-01-31 MED ORDER — NITROGLYCERIN IN D5W 200-5 MCG/ML-% IV SOLN: 0.0000 ug/min | INTRAVENOUS | Status: DC

## 2018-01-31 MED ORDER — NITROGLYCERIN IN D5W 200-5 MCG/ML-% IV SOLN
INTRAVENOUS | Status: AC
  Filled 2018-01-31: qty 250

## 2018-01-31 MED ORDER — LORAZEPAM 2 MG/ML IJ SOLN
0.5000 mg | INTRAMUSCULAR | Status: DC | PRN
  Filled 2018-01-31: qty 1

## 2018-01-31 MED ORDER — LORAZEPAM 2 MG/ML IJ SOLN: 0.5000 mg | Freq: Four times a day (QID) | INTRAMUSCULAR | Status: DC | PRN

## 2018-01-31 MED ORDER — MORPHINE SULFATE (PF) 2 MG/ML IV SOLN
2.0000 mg | INTRAVENOUS | Status: DC | PRN
  Filled 2018-01-31: qty 1

## 2018-01-31 MED ORDER — MORPHINE SULFATE 10 MG/5ML PO SOLN
5.0000 mg | ORAL | Status: DC | PRN
  Administered 2018-01-31: 5 mg via ORAL
  Filled 2018-01-31: qty 4

## 2018-01-31 MED ORDER — MORPHINE SULFATE (PF) 2 MG/ML IV SOLN
2.0000 mg | INTRAVENOUS | Status: DC | PRN
  Administered 2018-01-31 (×2): 2 mg via INTRAVENOUS
  Filled 2018-01-31 (×2): qty 1

## 2018-01-31 MED ORDER — POTASSIUM CHLORIDE CRYS ER 20 MEQ PO TBCR
40.0000 meq | EXTENDED_RELEASE_TABLET | Freq: Once | ORAL | Status: AC
  Administered 2018-01-31: 40 meq via ORAL
  Filled 2018-01-31: qty 2

## 2018-01-31 MED ORDER — AMIODARONE HCL IN DEXTROSE 360-4.14 MG/200ML-% IV SOLN
60.0000 mg/h | INTRAVENOUS | Status: AC
  Administered 2018-01-31 (×2): 60 mg/h via INTRAVENOUS
  Filled 2018-01-31 (×2): qty 200

## 2018-01-31 MED ORDER — LORAZEPAM 2 MG/ML IJ SOLN
0.2500 mg | Freq: Four times a day (QID) | INTRAMUSCULAR | Status: DC | PRN
  Administered 2018-01-31: 0.25 mg via INTRAVENOUS
  Filled 2018-01-31: qty 1

## 2018-01-31 MED ORDER — FUROSEMIDE 10 MG/ML IJ SOLN: 20.0000 mg | Freq: Once | INTRAMUSCULAR | Status: DC

## 2018-02-01 LAB — BLOOD CULTURE ID PANEL (REFLEXED)
ACINETOBACTER BAUMANNII: NOT DETECTED
CANDIDA GLABRATA: NOT DETECTED
CANDIDA TROPICALIS: NOT DETECTED
Candida albicans: NOT DETECTED
Candida krusei: NOT DETECTED
Candida parapsilosis: NOT DETECTED
ENTEROBACTER CLOACAE COMPLEX: NOT DETECTED
ENTEROCOCCUS SPECIES: NOT DETECTED
Enterobacteriaceae species: NOT DETECTED
Escherichia coli: NOT DETECTED
Haemophilus influenzae: NOT DETECTED
Klebsiella oxytoca: NOT DETECTED
Klebsiella pneumoniae: NOT DETECTED
LISTERIA MONOCYTOGENES: NOT DETECTED
Methicillin resistance: NOT DETECTED
NEISSERIA MENINGITIDIS: NOT DETECTED
PROTEUS SPECIES: NOT DETECTED
PSEUDOMONAS AERUGINOSA: NOT DETECTED
SERRATIA MARCESCENS: NOT DETECTED
STAPHYLOCOCCUS SPECIES: DETECTED — AB
Staphylococcus aureus (BCID): NOT DETECTED
Streptococcus agalactiae: NOT DETECTED
Streptococcus pneumoniae: NOT DETECTED
Streptococcus pyogenes: NOT DETECTED
Streptococcus species: NOT DETECTED

## 2018-02-01 NOTE — Progress Notes (Signed)
Pt pronounced deceased on 13-Feb-2018 at 2210-06-26 after auscultating heart x1 min w/second RN, Apolonio Schneiders T.... No visible chest rise seen.... Strip printed.   Family has been notified and have come in.... Attending physician has been notified and has signed the death certificate.... CDS has also been notified.  Pt has been tx to the morgue... Patient placement aware.   Blue folder placed in the Training and development officer... Pt's son will p/u today.

## 2018-02-03 LAB — CULTURE, BLOOD (ROUTINE X 2): SPECIAL REQUESTS: ADEQUATE

## 2018-02-04 ENCOUNTER — Ambulatory Visit: Payer: Medicare Other | Admitting: Physician Assistant

## 2018-02-04 LAB — CULTURE, BLOOD (ROUTINE X 2): Culture: NO GROWTH

## 2018-02-17 NOTE — Progress Notes (Addendum)
Pt had an episode of SVT.  Heart rate got as high as 200s bpm for 4 minutes.   Pt was short of breath, had an incontinent episode, feeling of fatigue.  Chest pressure 10 out of 10.  Pressure ended just as quickly as it came on .   Per night nurse- had several episode last PM.  But this is the longest episode.    Paged MD twice- waiting for return call

## 2018-02-17 NOTE — Progress Notes (Signed)
Called report to Nurse on [facility].

## 2018-02-17 NOTE — Progress Notes (Signed)
Patient had another episode of Chest pressure.  3 out of 4, SOB, pale.  EKG and Vital signs obtained.  MD paged Pt has nitro PRN but BP has been low 96/74.

## 2018-02-17 NOTE — Progress Notes (Signed)
Patient continues to complain of 10/10 chest pressure. MD at bedside currently. Patient has order to transfer to [facility] (ICU). Bed request sent. Cardiology is consulted and aware. Awaiting bed on 2H.

## 2018-02-17 NOTE — Progress Notes (Signed)
Spoke with pts family regarding the goals of care and the patients poor prognosis. Family stated that they would like to keep change his status to comfort measures at this time. Comfort measures ordered and PRN's adjusted.  Arby Barrette AGPCNP-BC, AGNP-C [facility] Pager [phone]

## 2018-02-17 NOTE — Progress Notes (Signed)
  Vital Signs MEWS/VS Documentation      2018-02-23 0729 02-23-18 0800 23-Feb-2018 0841 2018-02-23 0940   MEWS Score:  0  2  2  4    MEWS Score Color:  Green  Yellow  Yellow  Red   Resp:  -  -  -  (!) 28   Pulse:  100  (!) 101  -  (!) 105   BP:  122/64  94/67  -  96/74   Temp:  -  98 F (36.7 C)  -  -   O2 Device:  Nasal Cannula  Nasal Cannula  -  Nasal Cannula   O2 Flow Rate ([name]/min):  2 [name]/min  2 [name]/min  -  2 [name]/min   Level of Consciousness:  -  -  Alert  -     Pt is having episode of tachycardia to SVT.  MD is aware will continue to monitor closely.  Will Recheck vitals in 2 hours.        Trevor [name] Trevor [name] Feb 23, 2018,10:05 AM

## 2018-02-17 NOTE — Progress Notes (Signed)
Son, Trevor [name], called... Just wants to make sure he is called before anyone else since he is the POA.   Also, per Mercie Eon, NP - pt is on comfort measures and to hold off sched meds for tonight. He has spoken to family and they have agreed.   Will cont morphine IV push q hour PRN.

## 2018-02-17 NOTE — Progress Notes (Signed)
Patient c/o worsening SOB and labored breathing. O2 sats remain 100% on 2L n/c. Lungs sounds clear but VERY diminished, especially in the bases. Dr. Marthenia Rolling paged and new orders received.  Joellen Jersey, RN

## 2018-02-17 NOTE — Progress Notes (Signed)
Stat troponin just resulted-  Elevated 0.11

## 2018-02-17 NOTE — Consult Note (Addendum)
Cardiology Consultation:   Patient ID: Trevor [name] MRN: [id number]; DOB: 1945/12/02  Admit date: 02/01/2018 Date of Consult: 2018-02-15  Primary Care Provider: [name], MD Primary Cardiologist: [name], MD   Patient Profile:   Trevor [name] is a 72 y.o. male with  past medical history of CAD (s/p CABG in 2014, cath in 12/2017 showing patent LIMA-LAD with known occlusion of SVG's and no good options for PCI), chronic combined systolic and diastolic CHF, ischemic cardiomyopathy (EF 20-25% by echocardiogram in 01/08/2018), PAD, HTN, HLD, Type 2 DM, prior CVA, and Stage 3-4 CKD, and history of RCC (s/p nephrectomy in 02/2017), who is being seen today for the evaluation of VT at the request of Dr. Marlou Porch.   Echo 01/08/18 Study Conclusions  - Left ventricle: The cavity size was mildly dilated. Wall   thickness was normal. Systolic function was severely reduced. The   estimated ejection fraction was in the range of 20% to 25%.   Severe diffuse hypokinesis with no identifiable regional   variations. Dyskinesis and scarring of the basal-mid anteroseptal   myocardium and relatively preserved apical contraction;   consistent with infarction in the distribution of the proximal   left anterior descending coronary artery with patent bypass to   the distal LAD. There was fusion of early and atrial   contributions to ventricular filling. Features are consistent   with a pseudonormal left ventricular filling pattern, with   concomitant abnormal relaxation and increased filling pressure   (grade 2 diastolic dysfunction). Acoustic contrast opacification   revealed no evidence ofthrombus. - Ventricular septum: Septal motion showed paradox. - Mitral valve: There was mild to moderate regurgitation directed   centrally. - Left atrium: The atrium was severely dilated. - Right ventricle: Systolic function was moderately reduced. - Right atrium: The atrium was mildly dilated. - Pulmonary  arteries: PA peak pressure: 33 mm Hg (S).  LEFT HEART CATH AND CORS/GRAFTS ANGIOGRAPHY 01/08/18  Conclusion     Ost LAD to Prox LAD lesion is 100% stenosed.  Ost Ramus lesion is 99% stenosed.  Mid LM to Dist LM lesion is 95% stenosed with 95% stenosed side branch in Ost Cx to Prox Cx.  Ost 1st Mrg to 1st Mrg lesion is 90% stenosed.  Prox Cx to Mid Cx lesion is 60% stenosed.  Prox RCA lesion is 85% stenosed.  RPDA lesion is 90% stenosed.  Post Atrio lesion is 90% stenosed.  LIMA graft was visualized by angiography and is normal in caliber.  The graft exhibits no disease.  SVG graft was not injected.  Origin to Prox Graft lesion is 100% stenosed.  SVG graft was not injected.  Origin to Prox Graft lesion is 100% stenosed.  LV end diastolic pressure is moderately elevated.   1. Critical 3 vessel obstructive CAD- vessels are heavily calcified.     - 95% distal left main     - 100% proximal LAD    - 99% ostial ramus intermediate     - 95% ostial LCx    - 90% OM1    - 85% segmental proximal RCA    - 90% PDA    - 90% PLOM 2. Patent LIMA to the LAD 3. Known occlusion of SVGs 4. Moderately elevated LVEDP  Plan: compared to prior study in August 2018 there is progression of disease involving the ostium of the ramus intermediate. Otherwise no change. There are no good options for PCI and patient is not a candidate for CABG due to comorbid conditions.  Would recommend correction of metabolic derangements (DKA) and correct anemia. I don't think he is a candidate for any coronary intervention and would recommend medical management and palliative care.     History of Present Illness:   Trevor [name] has complex PMH with recent multiple admission in past one month. Hospitalized10/2019  with non-ST elevation myocardial infarction in the setting of metabolic abnormalities related to hyperglycemia.  He underwent cardiac catheterization which showed no significant change from before  with the exception of more obstructive disease in the ramus and a heavily calcified vessels.  No revascularization was advised. Has multiple admission since then, last admitted 11/7-11/10 for acute CHF.   Presented yesterday with generalized weakness. Patient is frail and pale. Difficult historian. No family at bedside. CXR with worsening infiltrate. Started on vanc and cefepime for possible HCAP.   Cardiology is asked for evaluation of VT and Chest pressure. During my evaluation patient is slow to response. Pale appearing. Complained of 8/10 chest pressure. Intermittent radiation of L shoulder. BP low. Supplemented for low potassium.   Past Medical History:  Diagnosis Date  . 3-vessel coronary artery disease   . Anemia    2 iron infusions  . Arthritis   . Carotid disease, bilateral (HCC)    moderate  . Chronic systolic heart failure (HCC)    EF 45%  . Coronary artery disease    06/2012:STEMI s/p CABG; 8/18 NSTEMI  . Diabetes mellitus without complication ([place])   . Diarrhea   . Dyspnea    mild  . History of blood transfusion   . Hyperlipidemia   . Ischemic cardiomyopathy    Ejection fraction of 35-40% initially, 45 - 50% on echo 2014, 53% by perfusion study 2016  . Mild mitral regurgitation   . Numbness    Right side since stroke  . OA (osteoarthritis)   . PAD (peripheral artery disease) ([place])   . Pulmonary nodule   . Renal cell cancer, left ([place]) 12/2016   Associated w/ pulmonary nodules  . Stroke ([place]) 2018  . TIA (transient ischemic attack) 10/2016  . Weakness generalized   . Wears glasses     Past Surgical History:  Procedure Laterality Date  . CARDIOVASCULAR STRESS TEST    . CORONARY ARTERY BYPASS GRAFT N/A 07/05/2012   Procedure: CORONARY ARTERY BYPASS GRAFTING (CABG);  Surgeon: Ivin Poot, MD;  Location: [facility];  Service: Open Heart Surgery;  Laterality: N/A;  . INTRAOPERATIVE TRANSESOPHAGEAL ECHOCARDIOGRAM N/A 07/05/2012   Procedure: INTRAOPERATIVE  TRANSESOPHAGEAL ECHOCARDIOGRAM;  Surgeon: Ivin Poot, MD;  Location: [facility];  Service: Open Heart Surgery;  Laterality: N/A;  . LEFT HEART CATH AND CORS/GRAFTS ANGIOGRAPHY N/A 11/14/2016   Procedure: LEFT HEART CATH AND CORS/GRAFTS ANGIOGRAPHY;  Surgeon: Belva Crome, MD;  Location: [facility];  Service: Cardiovascular;  Laterality: N/A;  . LEFT HEART CATH AND CORS/GRAFTS ANGIOGRAPHY N/A 01/08/2018   Procedure: LEFT HEART CATH AND CORS/GRAFTS ANGIOGRAPHY;  Surgeon: Martinique, Peter M, MD;  Location: [facility];  Service: Cardiovascular;  Laterality: N/A;  . LEFT HEART CATHETERIZATION WITH CORONARY ANGIOGRAM N/A 07/04/2012   Procedure: LEFT HEART CATHETERIZATION WITH CORONARY ANGIOGRAM;  Surgeon: Wellington Hampshire, MD;  Location: [facility];  Service: Cardiovascular;  Laterality: N/A;  . RENAL BIOPSY    . ROBOT ASSISTED LAPAROSCOPIC NEPHRECTOMY Left 02/21/2017   Procedure: XI ROBOTIC ASSISTED LAPAROSCOPIC NEPHRECTOMY;  Surgeon: Alexis Frock, MD;  Location: [facility];  Service: Urology;  Laterality: Left;  Inpatient Medications: Scheduled Meds: . aspirin EC  81 mg Oral Daily  . atorvastatin  40 mg Oral QHS  . benzonatate  200 mg Oral TID  . carvedilol  3.125 mg Oral Daily  . clopidogrel  75 mg Oral Daily  . enoxaparin (LOVENOX) injection  30 mg Subcutaneous Q24H  . ferrous sulfate  325 mg Oral Q breakfast  . furosemide  40 mg Oral Daily  . insulin aspart  0-15 Units Subcutaneous TID WC  . insulin aspart  0-5 Units Subcutaneous QHS  . isosorbide mononitrate  30 mg Oral Daily  . mirtazapine  7.5 mg Oral QHS  . potassium chloride  40 mEq Oral BID   Continuous Infusions: . sodium chloride    . ceFEPime (MAXIPIME) IV    . vancomycin     PRN Meds: nitroGLYCERIN  Allergies:   No Known Allergies  Social History:   Social History   Socioeconomic History  . Marital status: Widowed    Spouse name: Not on file  . Number of children: Not on file  . Years of education:  Not on file  . Highest education level: Not on file  Occupational History  . Occupation: Retired Fish farm manager  . Financial resource strain: Not on file  . Food insecurity:    Worry: Not on file    Inability: Not on file  . Transportation needs:    Medical: Not on file    Non-medical: Not on file  Tobacco Use  . Smoking status: Former Smoker    Packs/day: 1.00    Years: 50.00    Pack years: 50.00    Last attempt to quit: 04/20/2012    Years since quitting: 5.7  . Smokeless tobacco: Never Used  Substance and Sexual Activity  . Alcohol use: No  . Drug use: No  . Sexual activity: Not on file    Comment: retired, lives with sister and husband  Lifestyle  . Physical activity:    Days per week: Not on file    Minutes per session: Not on file  . Stress: Not on file  Relationships  . Social connections:    Talks on phone: Not on file    Gets together: Not on file    Attends religious service: Not on file    Active member of club or organization: Not on file    Attends meetings of clubs or organizations: Not on file    Relationship status: Not on file  . Intimate partner violence:    Fear of current or ex partner: Not on file    Emotionally abused: Not on file    Physically abused: Not on file    Forced sexual activity: Not on file  Other Topics Concern  . Not on file  Social History Narrative   Lives with sister.    Family History:   Family History  Problem Relation Age of Onset  . Cancer Mother        leukemia  . Stroke Mother   . Cancer Maternal Grandfather        possible cancer  . Heart attack Brother 24  . CAD Brother      ROS:  Please see the history of present illness.  All other ROS reviewed and negative.     Physical Exam/Data:   Vitals:   02/17/18 0729 02-17-18 0800 02/17/18 0940 02/17/18 1058  BP: 122/64 94/67 96/74  96/69  Pulse: 100 (!) 101 (!) 105 (!) 104  Resp:   Marland Kitchen)  28   Temp:  98 F (36.7 C)    TempSrc:  Oral    SpO2: 100%  100% 100% 99%    Intake/Output Summary (Last 24 hours) at March 02, 2018 1116 Last data filed at 03/02/18 0800 Gross per 24 hour  Intake 1397 ml  Output 200 ml  Net 1197 ml   There were no vitals filed for this visit. There is no height or weight on file to calculate BMI.  General: pale ill appearing elderly male in no acute distress HEENT: normal Lymph: no adenopathy Neck: difficult to assess JVD due to beard Endocrine:  No thryomegaly Vascular: No carotid bruits; FA pulses 2+ bilaterally without bruits  Cardiac:  normal S1, S2; RRR; no murmur Lungs:  clear to auscultation bilaterally, no wheezing, rhonchi or rales  Abd: soft, nontender, no hepatomegaly  Ext: no edema Musculoskeletal:  No deformities, BUE and BLE strength normal and equal Skin: warm and dry  Neuro:  CNs 2-12 intact, no focal abnormalities noted Psych:  Normal affect   EKG:  The EKG was personally reviewed and demonstrates:  SR with PVCs and ST depression in leads V4 to V6 Telemetry:  Telemetry was personally reviewed and demonstrates: SR with frequent PVCs and sustained VT Relevant CV Studies: As above   Laboratory Data:  Chemistry Recent Labs  Lab 01/26/18 0459 02/12/2018 1310 02-Mar-2018 0254  NA 132* 132* 135  K 3.5 3.4* 3.3*  CL 94* 96* 99  CO2 24 24 24   GLUCOSE 274* 338* 154*  BUN 30* 37* 37*  CREATININE 1.89* 1.98* 2.02*  CALCIUM 8.2* 7.8* 8.2*  GFRNONAA 34* 32* 31*  GFRAA 40* 37* 36*  ANIONGAP 14 12 12     Recent Labs  Lab 01/24/18 1647 02/14/2018 1310  PROT 5.7* 5.4*  ALBUMIN 2.7* 2.5*  AST 49* 63*  ALT 67* 75*  ALKPHOS 306* 317*  BILITOT 1.1 1.3*   Hematology Recent Labs  Lab 01/25/18 0457 02/10/2018 1310 2018-03-02 0254  WBC 9.8 8.5 10.4  RBC 3.25* 2.96* 3.47*  HGB 8.5* 7.9* 9.2*  HCT 28.8* 26.6* 30.7*  MCV 88.6 89.9 88.5  MCH 26.2 26.7 26.5  MCHC 29.5* 29.7* 30.0  RDW 19.7* 18.9* 18.0*  PLT 314 243 274   Cardiac Enzymes Recent Labs  Lab 01/28/2018 1310 02-Mar-2018 1026    TROPONINI 0.11* 0.11*    Recent Labs  Lab 01/24/18 1646  TROPIPOC 0.27*    BNP Recent Labs  Lab 01/24/18 1647 02/06/2018 1249  BNP 2,665.9* 2,641.2*    DDimer No results for input(s): DDIMER in the last 168 hours.  Radiology/Studies:  Dg Chest 2 View  Result Date: 02/12/2018 CLINICAL DATA:  Shortness of Breath EXAM: CHEST - 2 VIEW COMPARISON:  01/24/2018 FINDINGS: Cardiac shadow is again enlarged. Postsurgical changes are again seen. Persistent central vascular congestion is noted with increasing infiltrate particularly in the bases bilaterally when compare with the prior exam. Although this may represent worsening edema the possibility of superimposed infection deserves consideration. No sizable effusion is seen. No bony abnormality is noted. IMPRESSION: Increasing bibasilar infiltrates right greater than left likely representing superimposed pneumonia. Electronically Signed   By: Inez Catalina M.D.   On: 02/02/2018 14:00    Assessment and Plan:   1. Angina with CAD - Last cath in 12/2017 showing patent LIMA-LAD with known occlusion of SVG's. Pogression of disease involving the ostium of the ramus intermediate. Otherwise no change. There are no good options for PCI. Not candidate for re-do CABG due to comorbidity.  Patient with ST abnormality in lateral leads which is similar to prior admission recently. Complains of 8/10 chest pressure. Continue ASA and Plavix.   2. VT - 2 episode this morning lasting for >30 beats. Current electrolytes abnormality. K of 3.3. Will give addition Kdur 40 meq x 3. Keep K > 4. Mg 2.1. Start IV amiodarone. palliative care has been consulted. Symptoms management.   3. Acute on chronic CHF - Continue lasix. Stop BB given low BB.     For questions or updates, please contact [place] Please consult [email or web address] for contact info under     Jarrett Soho, PA  02-05-18 11:16 AM   Personally seen and examined. Agree with  above.  72 year old male with extensive cardiac history status post CABG, with repeat cardiac catheterization showing no options for PCI, known occlusion of SVGs with chronic combined systolic and diastolic heart failure EF 20%, peripheral arterial disease diabetes with prior stroke and nephrectomy secondary to renal cell carcinoma in December 2018 who is being seen today for evaluation of ventricular tachycardia at the request of Dr. Marthenia Rolling.  He has been transferred to the to heart unit and is currently in bed resting comfortably but appears pale, chronically ill he is no longer having any chest discomfort.  There was concern early on chest x-ray for worsening infiltrate was started empirically for hospital-acquired pneumonia.  Telemetry revealed 2 separate episodes of sustained ventricular tachycardia lasting greater than 30 seconds.  Potassium was 3.3.  Magnesium 2.1.  He did not lose consciousness.  Felt poor.  This is his fourth hospitalization this month.  GEN: Pale appearing, ill-appearing minimal distress  HEENT: normal  Neck: JVD unable to assess, carotid bruits, or masses Cardiac: Regular rhythm with occasional ectopy; no murmurs, rubs, or gallops,3+LE edema  Respiratory:  clear to auscultation bilaterally, normal work of breathing GI: soft, nontender, nondistended, + BS MS: no deformity or atrophy  Skin: warm and dry, no rash, pale Neuro:  Alert and Oriented x 3, Strength and sensation are intact Psych: Somber but pragmatic mood, full affect  Lab work reviewed as above. EKGs personally reviewed as above.  Assessment and plan:  NYHA class IV, stage D heart failure, systolic dysfunction, ischemic cardiomyopathy/ventricular tachycardia - Unfortunately, not a candidate for further invasive procedures prior catheterization reviewed.  No PCI options.  He does have a patent LIMA to LAD.  His EF however remains 20%. -He has been transferred to the cardiac ICU. -We will start IV  amiodarone to hopefully help suppress further episodes of ventricular tachycardia. -Repleting potassium.  Was 3.3.  Magnesium was normal. - Stopping beta-blocker at this time given his blood pressure of 80.  Trying to continue Lasix. -Ultimately, we had a discussion about end-of-life care.  He is with end-stage heart failure and understands that his ventricular tachycardia or signs that his heart/life is coming to an end.  He clearly states that he does not want to be shocked or placed on a ventilator.  He is currently DNR.  I reiterated of course that we will respect his wishes and to proceed with amiodarone to hopefully help suppress the ventricular tachycardia and assist with his comfort, that is decrease his anginal symptoms.  He is amenable to this plan.  We will not escalate up to pressors. - We will request palliative care team to come by and discuss potential hospice care for him. -Appreciate the care team helping to take care of him currently -For chest discomfort, we can try a  very low-dose of nitroglycerin drip to see if this will help.  We can also have morphine 2 mg every hour as needed on hand as well.  Critical care time 40 minutes spent with patient extensive data review including cardiac catheterization lab work prior hospital and office notes in this gentleman with multisystem organ failure with end-stage heart failure with life-threatening arrhythmia.  Candee Furbish, MD

## 2018-02-17 NOTE — Progress Notes (Signed)
Pt c/o worsening 8/10 midsternal chest pain. Dr. Marlou Porch notified - he stated we could try prn morphine to relieve the pain and if it doesn't work then we could start a really low dose of IV nitro. Will continue to monitor.  Joellen Jersey, RN

## 2018-02-17 NOTE — Progress Notes (Signed)
PROGRESS NOTE    Trevor [name]  NGE:[id number] DOB: Oct 29, 1945 DOA: 02/06/2018 PCP: [name], MD  Outpatient Specialists:   Brief Narrative:  Trevor [name] is a 72 y.o. male with medical history significant for CAD status post anterior STEMI in 2014 status post CABG, ischemic cardiomyopathy with EF of 20 to 25%, type 2 diabetes, peripheral arterial disease, status post CVA in 2018, stage IV renal cell carcinoma status post nephrectomy, pulmonary nodules and chronic anemia.  Patient was discharged from the hospital on January 27, 2018.  According to the patient's son, patient was supposed to follow-up with hospice team on last discharge from the hospital.  Patient presents with worsening shortness of breath, worsening generalized weakness, malaise and fatigue.  Since admission, patient has had nonsustained ventricular tachycardia, as well as supraventricular tachycardias with heart rate in the 200s, with associated shortness of breath, chest pain, and diaphoresis.  Blood pressure has also been labile.  Stat cardiology consult was called and the patient was transferred to the coronary unit.  As per the patient's nurse, the cardiology team has started patient on IV morphine 1 mg every hour as needed.  I also discussed with the patient's son extensively, in the presence of patient's nurse and 1 other nurse.  Patient's son informs [place] that he is the patient's healthcare power of attorney.  The patient's son has asked for hospice team input, with the goal being comfort directed measures.  I made a stat social work consult, discussed with the social worker personally, and introduced patient's son to the social worker so disposition can be facilitated.  [facility] of [facility]) was broached with the patient's son.  Patient's prognosis is poor.  Systolic blood pressures is in the 80s.  Cardiology input is appreciated.  Palliative care team was consulted earlier.  Input from the palliative/hospice care  team will be highly appreciated.  I have updated all , including patient's nurse and son.  Assessment & Plan:   Principal Problem:   Weakness generalized Active Problems:   Coronary artery disease   Ischemic cardiomyopathy   Hyperlipidemia   Iron deficiency anemia due to chronic blood loss   Anemia   Renal cell carcinoma (HCC)   History of CVA (cerebrovascular accident)   Chronic combined systolic and diastolic heart failure (HCC)   Diabetes mellitus type 2 in nonobese (HCC)   CKD (chronic kidney disease), stage III (HCC)   Lactic acidosis   HCAP (healthcare-associated pneumonia)   Insomnia    Acute on chronic chronic combined CHF, EF 20-25%: -Patient has end-stage congestive heart failure.   -Cardiology input is appreciated, IV morphine 1 mg/h as needed prescribed by the cardiology team.   -Patient also has also opted for comfort directed measures.   -Patient wants to be comfortable.   -Await input from the hospice team.   -Likely DC to a hospice house.   -Await input from the hospice team palliative care team.   -Guarded/poor prognosis.    CAD, ischemic cardiomyopathy s/p CABG: -The trend is towards comfort directed care   Hyperlipidemia:  -cont statin  DM2: Optimize. -Continue to hold home Januvia -SSI -carb modified diet  AKI on CKD III, creatinine at baseline: -Managed supportively. -Likely indicate poor prognosis as well.  Iron deficiency anemia / anemia of chronic disease; acute on chronic: Further management will depend on goal of care.  RCC: -nivolumab on hold 2/2 hepatitis, cardiac issues -f/u as outpatient :-cont plavix, ASA, statin   DVT prophylaxis:  Lovenox  Code Status: DNR/ pt is unsure if he would want intubation; would continue to address  Family Communication: none  Disposition Plan:  Home once clinically improved  Consultants:   Cardiology  Palliative care nurse,  Hospitalist team.  Procedures:    None  Antimicrobials:   Vancomycin  Cefepime   Subjective: Shortness of breath Weakness and fatigue No fever or chills No productive cough No urinary symptoms  Objective: Vitals:   02/03/18 1345 02/03/18 1400 02/03/18 1430 02-03-2018 1500  BP: (!) 82/62 (!) 85/60 (!) 84/65 (!) 81/68  Pulse: 79 76 73 77  Resp: (!) 25 (!) 22 (!) 23 18  Temp:      TempSrc:      SpO2: 99% 100% 98% 99%    Intake/Output Summary (Last 24 hours) at 02/03/2018 1605 Last data filed at February 03, 2018 1500 Gross per 24 hour  Intake 1722.5 ml  Output 250 ml  Net 1472.5 ml   There were no vitals filed for this visit.  Examination:  General exam: Appears ill looking.   Respiratory system: Decreased air entry.  l. Cardiovascular system: S1 & S2 heard. Leg edema. Gastrointestinal system: Abdomen is nondistended, soft and nontender. No organomegaly or masses felt. Normal bowel sounds heard. Central nervous system: Awake but mildly lethargic.  Patient seems to move all extremities.   Extremities: Bilateral leg edema.     Data Reviewed: I have personally reviewed following labs and imaging studies  CBC: Recent Labs  Lab 01/24/18 1647 01/25/18 0457 02/10/2018 1310 03-Feb-2018 0254  WBC 9.8 9.8 8.5 10.4  NEUTROABS 7.3  --  6.4  --   HGB 8.5* 8.5* 7.9* 9.2*  HCT 28.1* 28.8* 26.6* 30.7*  MCV 88.9 88.6 89.9 88.5  PLT 280 314 243 696   Basic Metabolic Panel: Recent Labs  Lab 01/24/18 1647 01/25/18 0457 01/26/18 0459 01/29/2018 1310 02-03-18 0254 2018/02/03 0419  NA 135 136 132* 132* 135  --   K 3.7 4.0 3.5 3.4* 3.3*  --   CL 98 99 94* 96* 99  --   CO2 25 29 24 24 24   --   GLUCOSE 163* 176* 274* 338* 154*  --   BUN 26* 30* 30* 37* 37*  --   CREATININE 1.80* 1.93* 1.89* 1.98* 2.02*  --   CALCIUM 8.4* 8.8* 8.2* 7.8* 8.2*  --   MG  --   --   --   --   --  2.1   GFR: Estimated Creatinine Clearance: 35.6 mL/min (A) (by C-G formula based on SCr of 2.02 mg/dL (H)). Liver Function Tests: Recent  Labs  Lab 01/24/18 1647 01/29/2018 1310  AST 49* 63*  ALT 67* 75*  ALKPHOS 306* 317*  BILITOT 1.1 1.3*  PROT 5.7* 5.4*  ALBUMIN 2.7* 2.5*   No results for input(s): LIPASE, AMYLASE in the last 168 hours. No results for input(s): AMMONIA in the last 168 hours. Coagulation Profile: No results for input(s): INR, PROTIME in the last 168 hours. Cardiac Enzymes: Recent Labs  Lab 02/13/2018 1310 Feb 03, 2018 1026  TROPONINI 0.11* 0.11*   BNP (last 3 results) No results for input(s): PROBNP in the last 8760 hours. HbA1C: No results for input(s): HGBA1C in the last 72 hours. CBG: Recent Labs  Lab 01/27/18 0740 01/23/2018 2104 02-03-2018 0741 02/03/2018 1114 2018/02/03 1214  GLUCAP 275* 143* 146* 162* 156*   Lipid Profile: No results for input(s): CHOL, HDL, LDLCALC, TRIG, CHOLHDL, LDLDIRECT in the last 72 hours. Thyroid Function Tests: No results for input(s):  TSH, T4TOTAL, FREET4, T3FREE, THYROIDAB in the last 72 hours. Anemia Panel: No results for input(s): VITAMINB12, FOLATE, FERRITIN, TIBC, IRON, RETICCTPCT in the last 72 hours. Urine analysis:    Component Value Date/Time   COLORURINE AMBER (A) Feb 08, 2018 1457   APPEARANCEUR HAZY (A) 02/08/18 1457   LABSPEC 1.021 02-08-2018 1457   PHURINE 5.0 02-08-2018 1457   GLUCOSEU NEGATIVE 02-08-2018 1457   HGBUR NEGATIVE 02-08-2018 1457   BILIRUBINUR NEGATIVE Feb 08, 2018 1457   KETONESUR 5 (A) 2018/02/08 1457   PROTEINUR NEGATIVE 2018-02-08 1457   UROBILINOGEN 0.2 07/04/2012 1852   NITRITE NEGATIVE 02-08-18 1457   LEUKOCYTESUR NEGATIVE Feb 08, 2018 1457   Sepsis Labs: @LABRCNTIP (procalcitonin:4,lacticidven:4)  ) Recent Results (from the past 240 hour(s))  Blood Culture (routine x 2)     Status: None (Preliminary result)   Collection Time: 01/29/2018  1:40 PM  Result Value Ref Range Status   Specimen Description BLOOD LEFT ANTECUBITAL  Final   Special Requests   Final    BOTTLES DRAWN AEROBIC AND ANAEROBIC Blood Culture adequate  volume   Culture   Final    NO GROWTH < 24 HOURS Performed at [facility], [place] [address] [place], [place] [address]    Report Status PENDING  Incomplete  Blood Culture (routine x 2)     Status: None (Preliminary result)   Collection Time: 02/02/2018  2:17 PM  Result Value Ref Range Status   Specimen Description BLOOD RIGHT HAND  Final   Special Requests   Final    BOTTLES DRAWN AEROBIC ONLY Blood Culture results may not be optimal due to an inadequate volume of blood received in culture bottles   Culture   Final    NO GROWTH < 24 HOURS Performed at [facility], [place] [address], [place], [place] [address]    Report Status PENDING  Incomplete  MRSA PCR Screening     Status: None   Collection Time: Feb 08, 2018 11:42 AM  Result Value Ref Range Status   MRSA by PCR NEGATIVE NEGATIVE Final    Comment:        The GeneXpert MRSA Assay (FDA approved for NASAL specimens only), is one component of a comprehensive MRSA colonization surveillance program. It is not intended to diagnose MRSA infection nor to guide or monitor treatment for MRSA infections. Performed at [facility], [place] [address], [place], [place] [address]          Radiology Studies: Dg Chest 2 View  Result Date: 01/23/2018 CLINICAL DATA:  Shortness of Breath EXAM: CHEST - 2 VIEW COMPARISON:  01/24/2018 FINDINGS: Cardiac shadow is again enlarged. Postsurgical changes are again seen. Persistent central vascular congestion is noted with increasing infiltrate particularly in the bases bilaterally when compare with the prior exam. Although this may represent worsening edema the possibility of superimposed infection deserves consideration. No sizable effusion is seen. No bony abnormality is noted. IMPRESSION: Increasing bibasilar infiltrates right greater than left likely representing superimposed pneumonia. Electronically Signed   By: Inez Catalina M.D.   On: 01/28/2018 14:00   Dg Chest Port 1 View  Result  Date: 02-08-2018 CLINICAL DATA:  Short of breath for 3 days EXAM: PORTABLE CHEST 1 VIEW COMPARISON:  Chest x-ray of 01/18/2018 and 01/24/2018 FINDINGS: There is little change in airspace disease right greater than left most consistent with pneumonia. Overlapping pulmonary vascular congestion cannot be excluded. Moderate cardiomegaly remains and median sternotomy sutures are noted from prior CABG. IMPRESSION: Little change in airspace disease  right much greater than left. Favor multifocal pneumonia. Stable cardiomegaly. Electronically Signed   By: Ivar Drape M.D.   On: 24-Feb-2018 15:12        Scheduled Meds: . aspirin EC  81 mg Oral Daily  . atorvastatin  40 mg Oral QHS  . benzonatate  200 mg Oral TID  . clopidogrel  75 mg Oral Daily  . enoxaparin (LOVENOX) injection  30 mg Subcutaneous Q24H  . ferrous sulfate  325 mg Oral Q breakfast  . furosemide  40 mg Oral Daily  . insulin aspart  0-15 Units Subcutaneous TID WC  . insulin aspart  0-5 Units Subcutaneous QHS  . isosorbide mononitrate  30 mg Oral Daily  . mirtazapine  7.5 mg Oral QHS  . potassium chloride  40 mEq Oral BID   Continuous Infusions: . amiodarone 60 mg/hr (2018-02-24 1500)   Followed by  . amiodarone    . ceFEPime (MAXIPIME) IV    . nitroGLYCERIN    . nitroGLYCERIN    . vancomycin 1,000 mg (Feb 24, 2018 1511)     LOS: 1 day    Time spent: 45 minutes    Dana Allan, MD  [facility] Pager #: [phone] 7PM-7AM contact night coverage as above

## 2018-02-17 NOTE — Progress Notes (Signed)
Spoke to the MD-  Ordered Stat troponin once.  Vital will be repeated in 2 hours and will continue to monitor

## 2018-02-17 NOTE — Care Management Note (Signed)
Case Management Note  Patient Details  Name: Trevor [name] MRN: [id number] Date of Birth: [birth year]  Subjective/Objective: Weakness, Pneumonia, Sepsis                  Action/Plan: Patient lives at home with sister; PCP: [name], MD; has private insurance with [facility] with prescription drug coverage; CM will continue to follow for progression of care.  Expected Discharge Date:      Possibly 02/03/2018            Expected Discharge Plan:  Home/Self Care  Discharge planning Services  CM Consult  Status of Service:  In process, will continue to follow  Sherrilyn Rist [phone] 02-07-2018, 10:34 AM

## 2018-02-17 NOTE — Significant Event (Addendum)
Rapid Response Event Note  Overview: Elevated HR   Initial Focused Assessment: Called by 3E staff about patient's HR being in the 200s. Upon arrival, RNs were trying to obtain and EKG. Patient was resting comfortably. Patient denied chest pain or discomfort. Skin was warm and dry, + pulses. HR went from 200s to as low as the 50s. Initially rhythm looked like a wide complex tachycardia and when HR was in the 50-60s, rhythm appeared to ST with frequent PVCs. Patient has had a several runs/bursts of VT, has remained asymptomatic. Not in acute distress, SBP 90s and MAP > 60.  Interventions: - EKG - RN to paged [facility] NP  Plan of Care (if not transferred): - RN awaiting orders for [facility] NP  Event Summary:    at    Call Time 0406 Arrival Time [place], [place]

## 2018-02-17 NOTE — Progress Notes (Signed)
Pt had 29 beat run of Vtach. Pt c/o sob. No chest pain or pressure. Provider notified.

## 2018-02-17 NOTE — Progress Notes (Signed)
CSW received call from MD about patient stating that he "wanted to be comfortable", MD requested that CSW speak with patient's son about hospice care. CSW spoke with patient's son over the phone, and patient's son described potential miscommunication occurring with the MD. Per the son, the patient has been experiencing SOB, unable to sleep, would like to given something to make him more comfortable so that he can get some sleep and rest; not interested in discussing full comfort care at this time. Patient's son acknowledged that the time that the patient's heart decides to give out may be coming up soon, but that they're not ready to make that decision right now.   CSW told patient's son to discuss with nurse and continue to attempt to explain to MD what the patient is requesting.  Laveda Abbe, [place] Clinical Social Worker [phone]

## 2018-02-17 NOTE — Progress Notes (Signed)
Morphine oral solution was not scanning. Trevor [name], RPH verified at bedside.   Called Trevor [name] at [facility] him an update on pt.   Pt is DNR. Pt's BP = 68/50 w/MAP = 57. On Amio gtt at 30 mg/hr. HR = 49. Pt is restless and c/o pain and SOB. RR = 34. Gave him morphine PO 5 mg oral solution  Pt still c/o CP = 3/10 and having SOB  Trevor Barrette, NP called and spoke w/step-son Trevor [name] at [phone] to update them.   Pt has been placed on comfort care. See new orders.

## 2018-02-17 NOTE — Progress Notes (Signed)
PMT no charge note.   Received page from medical director at [facility], noting patient's bedside RN's concerns regarding patient's shortness of breath, low BPs 60s/40s, minimal to nil urine output and generalized distress.   Chart reviewed in detail.   Med history also noted.   Plan: 1. Morphine solution 5 mg PO Q 2 hours prn shortness of breath or chest pain.   2. Ativan 0.25 mg IV Q 6 hours PRN, for agitation and restlessness. Patient reportedly hasn't slept in 3 days.   I have discussed with bedside RN about the above PRNs.   Call placed and discussed with son Mr Yom as well, about the above PRNs to be used. Also gently discussed that the patient appears to be rapidly declining from serious irreversible illness that is cardiac related.   A full palliative consult, including detailed goals of care discussions, hospice care discussions and appropriate disposition planning, in addition to aggressive symptom management to be completed on 02-01-18.   Code status is noted to be DNR.   No charge note.   Loistine Chance MD [facility] health palliative medicine team [phone]

## 2018-02-17 DEATH — deceased

## 2018-02-21 ENCOUNTER — Other Ambulatory Visit: Payer: Medicare Other

## 2018-02-21 ENCOUNTER — Ambulatory Visit: Payer: Medicare Other

## 2018-02-21 ENCOUNTER — Ambulatory Visit: Payer: Medicare Other | Admitting: Internal Medicine

## 2018-03-14 NOTE — Discharge Summary (Signed)
Physician Discharge Summary  Trevor [name] FIE:[id number] DOB: 10/28/1945 DOA: 02/28/18  PCP: [name], MD  Admit date: 2018/02/28 Patient died on 2018-03-01.    Death discharge summary:   Discharge Diagnoses:  Principal Problem:   Weakness generalized Active Problems:   Coronary artery disease   Ischemic cardiomyopathy   Hyperlipidemia   Iron deficiency anemia due to chronic blood loss   Anemia   Renal cell carcinoma (HCC)   History of CVA (cerebrovascular accident)   Chronic combined systolic and diastolic heart failure (HCC)   Diabetes mellitus type 2 in nonobese (HCC)   CKD (chronic kidney disease), stage III (HCC)   Lactic acidosis   HCAP (healthcare-associated pneumonia)   Insomnia   Hospital Course:  Trevor [name] a 72 year old male, with past medical history significant forCAD status post anterior STEMI in 2014 status post CABG, ischemic cardiomyopathy with EF of 20 to 25%, type 2 diabetes, peripheral arterial disease, status post CVA in 2018, stage IV renal cell carcinoma status post nephrectomy, pulmonary nodules and chronic anemia.  Patient was discharged from the hospital on January 27, 2018.  According to the patient's son, patient was supposed to follow-up with hospice team on last discharge from the hospital.  Patient presented with worsening shortness of breath, worsening generalized weakness, malaise and fatigue.  Following admission, patient had nonsustained ventricular tachycardia, as well as supraventricular tachycardias with heart rate in the 200s, with associated shortness of breath, chest pain, and diaphoresis.  Blood pressure was also been labile.  Stat cardiology consult was called and the patient was transferred to the coronary unit.  As per the patient's nurse, the cardiology team had started patient on IV morphine 1 mg every hour as needed.  I also discussed with the patient's son extensively, in the presence of patient's nurse and 1 other nurse.   Patient's son informed us that he was the patient's healthcare power of attorney.  The patient's son asked for hospice team input, with the goal being comfort directed measures.  Stat social work consult, discussed with the social worker personally, and introduced patient's son to the Education officer, museum so disposition could be facilitated.  Option of [facility]) was broached with the patient's son.  Patient's prognosis was noted to be poor.  Systolic blood pressures was in the 80's, and continued to decline.  Cardiology input is appreciated.  Palliative care team was consulted earlier.  Comfort directed care was instituted, and patient eventually passed on.  Significant Diagnostic Studies: No results found.  Microbiology: No results found for this or any previous visit (from the past 240 hour(s)).   Labs: Basic Metabolic Panel: No results for input(s): NA, K, CL, CO2, GLUCOSE, BUN, CREATININE, CALCIUM, MG, PHOS in the last 168 hours. Liver Function Tests: No results for input(s): AST, ALT, ALKPHOS, BILITOT, PROT, ALBUMIN in the last 168 hours. No results for input(s): LIPASE, AMYLASE in the last 168 hours. No results for input(s): AMMONIA in the last 168 hours. CBC: No results for input(s): WBC, NEUTROABS, HGB, HCT, MCV, PLT in the last 168 hours. Cardiac Enzymes: No results for input(s): CKTOTAL, CKMB, CKMBINDEX, TROPONINI in the last 168 hours. BNP: BNP (last 3 results) Recent Labs    01/24/18 1647 02-28-18 1249 March 01, 2018 1514  BNP 2,665.9* 2,641.2* 2,739.3*    ProBNP (last 3 results) No results for input(s): PROBNP in the last 8760 hours.  CBG: No results for input(s): GLUCAP in the last 168 hours.     Signed:  Yehuda Savannah  Matilde Sprang MD.  [facility] 03/14/2018, 2:20 AM

## 2018-04-12 ENCOUNTER — Other Ambulatory Visit: Payer: Medicare Other

## 2018-04-16 ENCOUNTER — Ambulatory Visit: Payer: Medicare Other

## 2018-04-16 ENCOUNTER — Ambulatory Visit: Payer: Medicare Other | Admitting: Internal Medicine

## 2018-04-30 ENCOUNTER — Ambulatory Visit: Payer: Medicare Other | Admitting: Cardiovascular Disease

## 2018-11-03 IMAGING — DX DG CHEST 1V PORT
1 series · 1 of 1 positions shown · non-contrast
Comparison: January 07, 2018

CLINICAL DATA: Shortness of breath.

EXAM:
PORTABLE CHEST 1 VIEW

[chest ap]
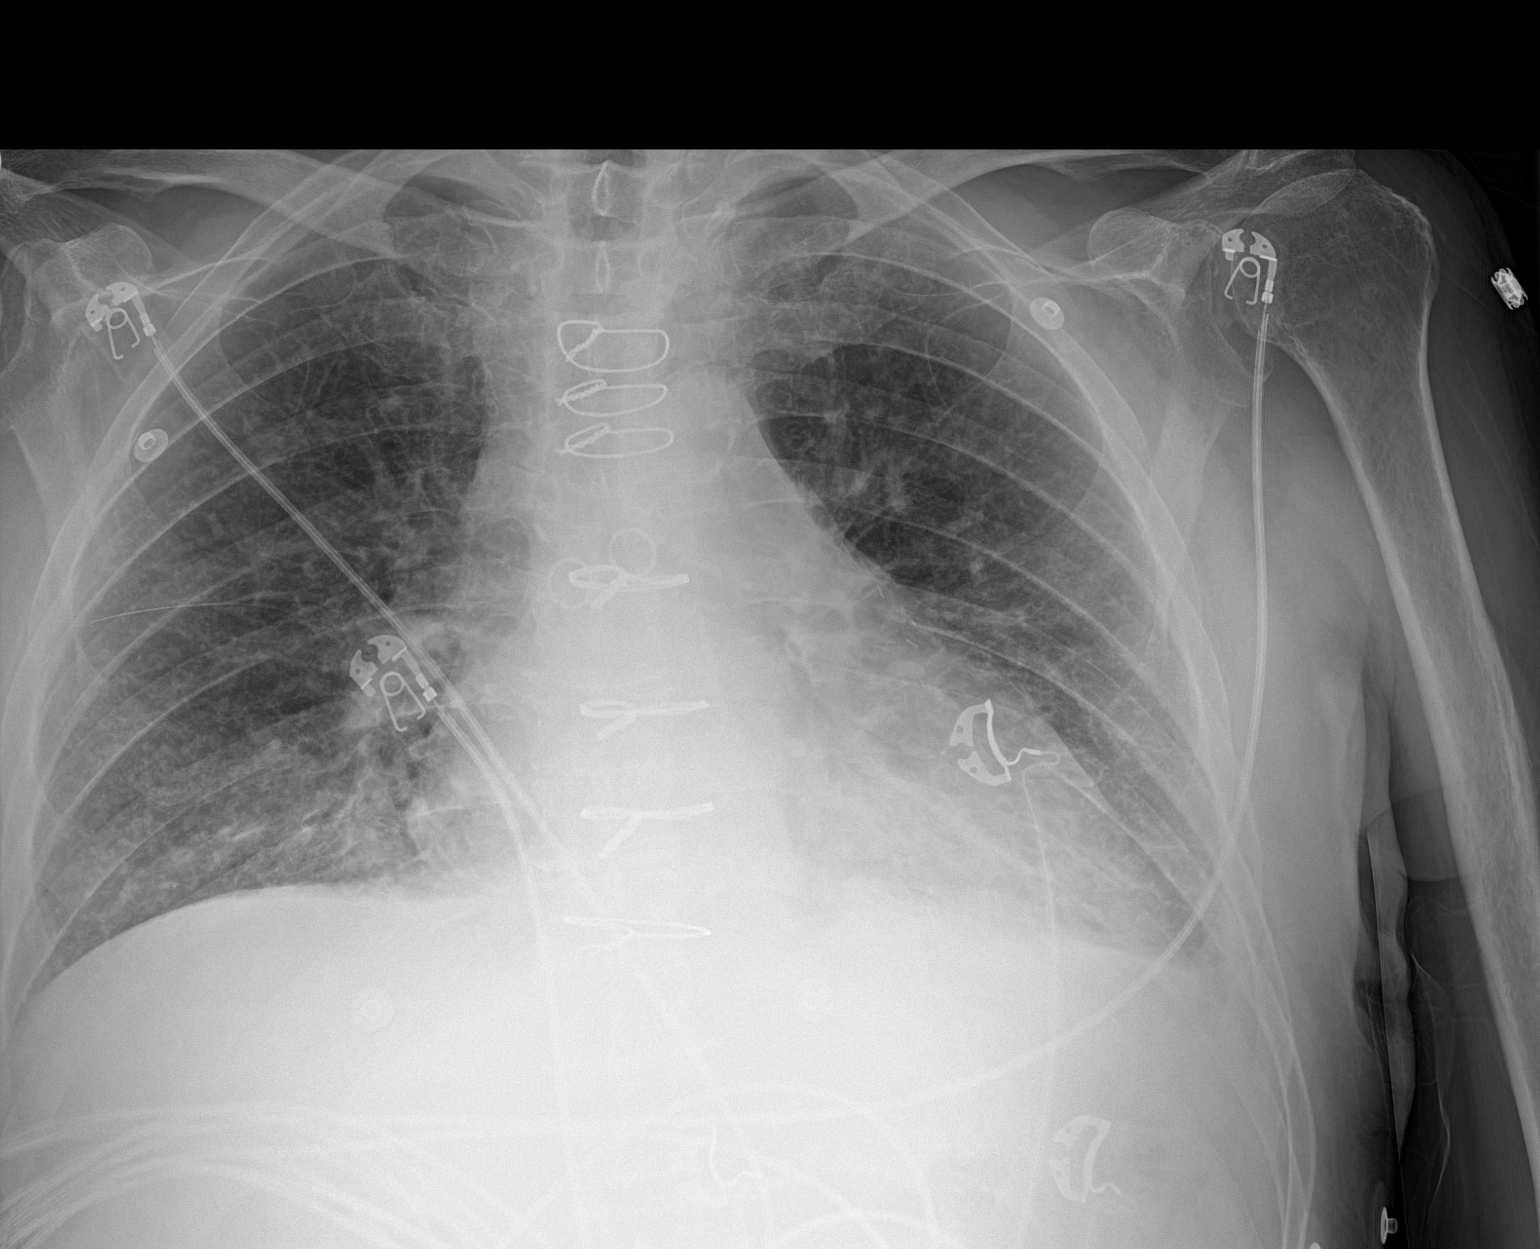

[1 of 1 positions shown; findings below may reference images not displayed]

FINDINGS: Stable cardiomegaly. Mild pulmonary venous congestion. Probable
small left effusion with atelectasis.
IMPRESSION: Cardiomegaly and pulmonary venous congestion. Probable small left
effusion with atelectasis.

## 2018-12-30 IMAGING — US US BIOPSY
1 series · 13 of 22 positions shown · non-contrast
Comparison: PET-CT - 12/27/2016

INDICATION: History of presumed metastatic renal cell carcinoma. Please perform
ultrasound-guided left renal mass biopsy for tissue diagnostic
purposes.

EXAM:
ULTRASOUND GUIDED RENAL LESION BIOPSY

[Series 1: us biopsy · 0.23mm/px · 13 of 22 slices shown]
[im 1/22]
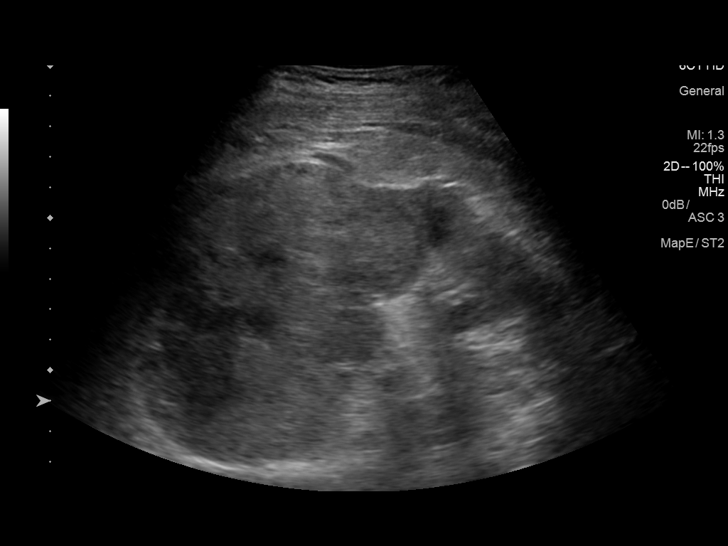
[im 3/22]
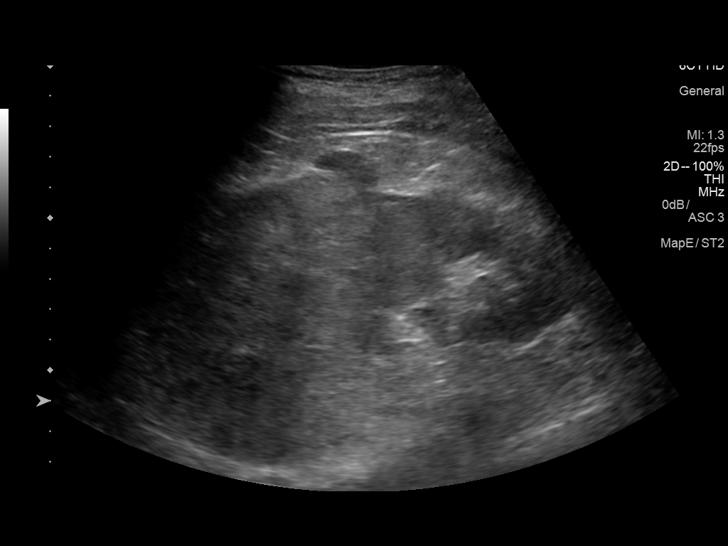
[im 5/22]
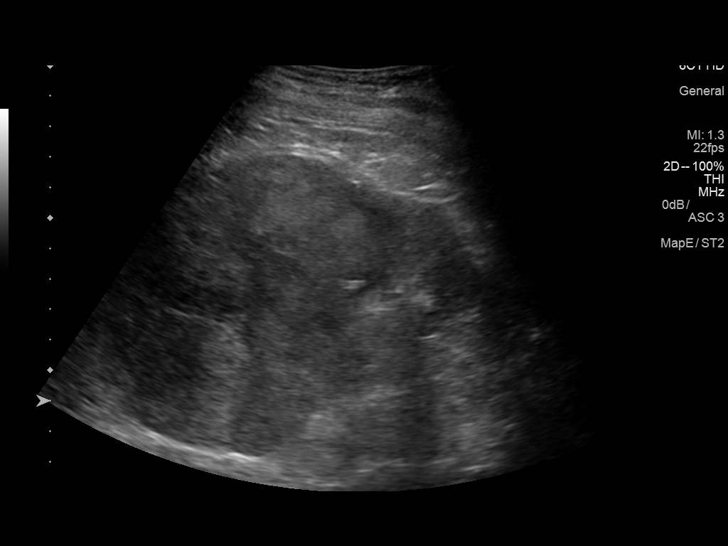
[im 6/22]
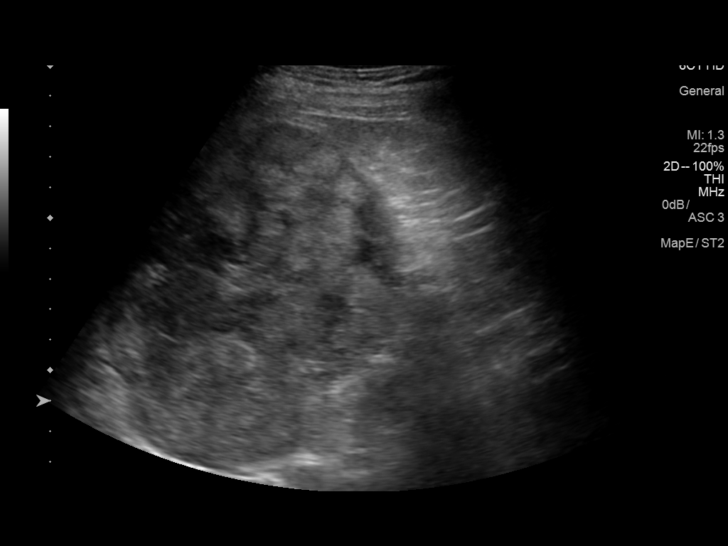
[im 8/22]
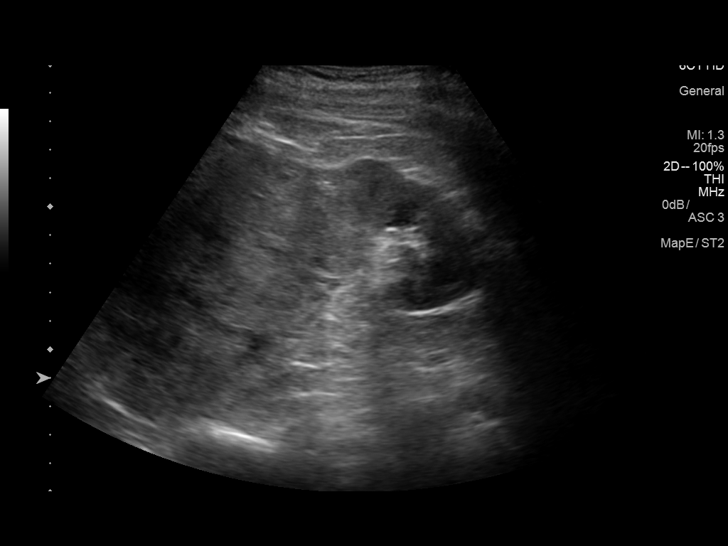
[im 10/22]
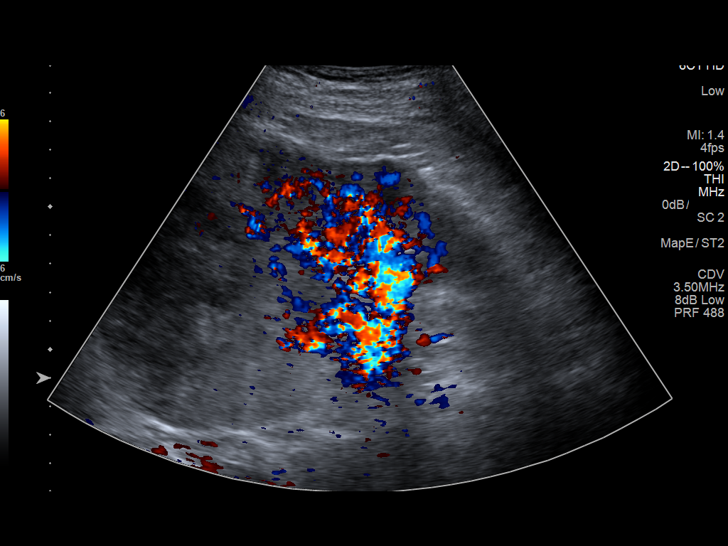
[im 12/22]
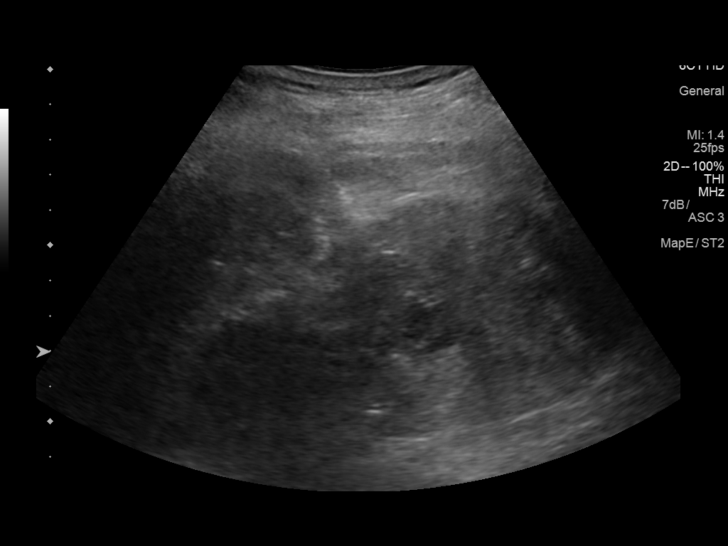
[im 13/22]
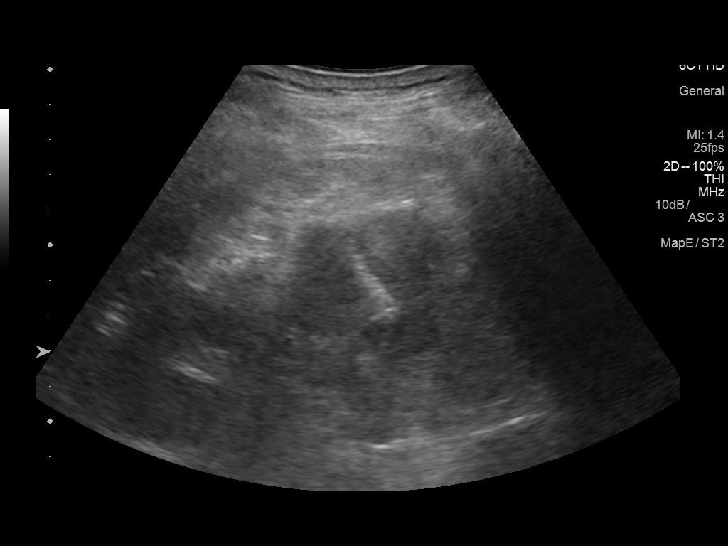
[im 15/22]
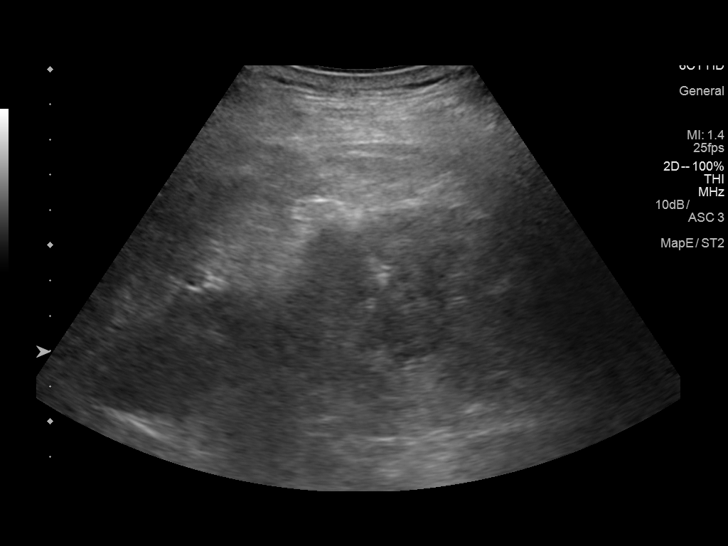
[im 17/22]
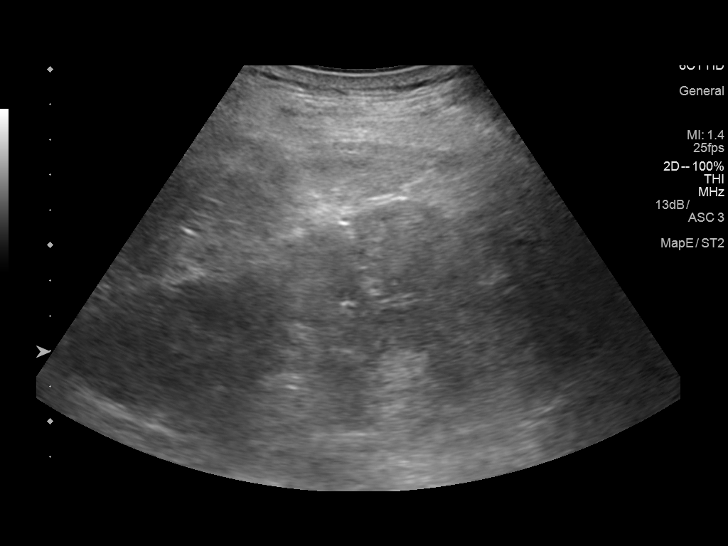
[im 18/22]
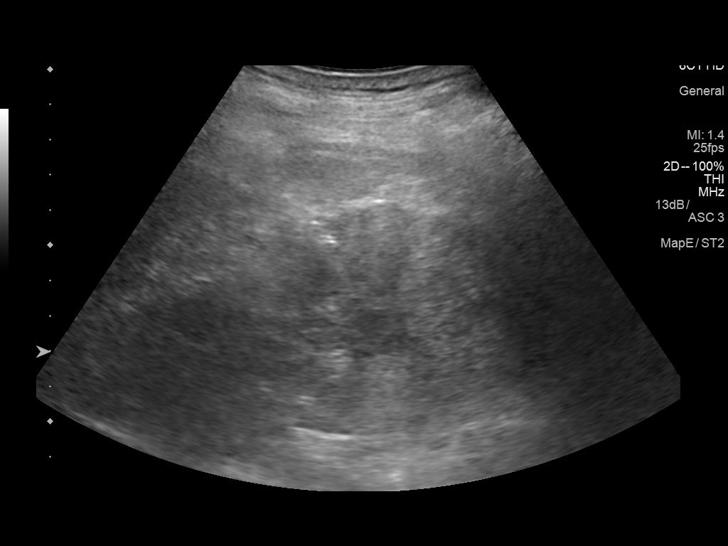
[im 20/22]
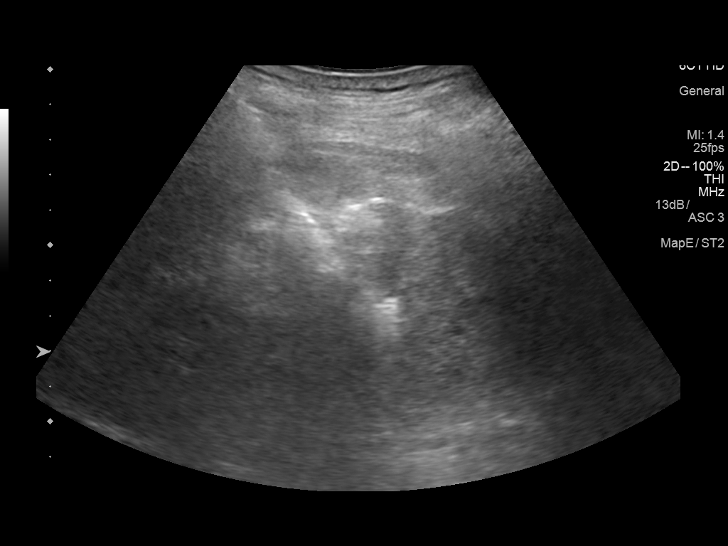
[im 22/22]
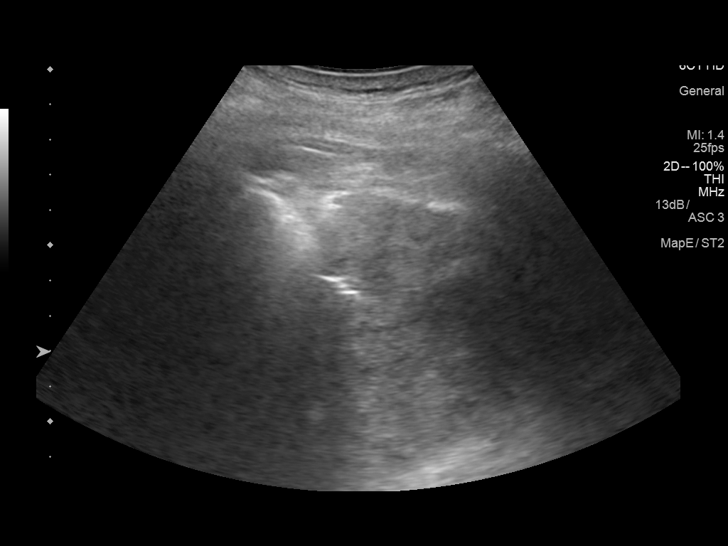

[13 of 22 positions shown; findings below may reference images not displayed]

MEDICATIONS:
None.

ANESTHESIA/SEDATION:
Fentanyl 100 mcg IV; Versed 2 mg IV

Total Moderate Sedation time: 14 minutes; The patient was
continuously monitored during the procedure by the interventional
radiology nurse under my direct supervision.

COMPLICATIONS:
None immediate.

PROCEDURE:
Informed written consent was obtained from the patient after a
discussion of the risks, benefits and alternatives to treatment. The
patient understands and consents the procedure. A timeout was
performed prior to the initiation of the procedure.

Ultrasound scanning was performed of the left flank demonstrating an
enlarged (at least 10.7 x 7.8 cm) mass arising from the superior
pole of the left kidney (image 2).

The procedure was planned. The operative site was prepped and draped
in the usual sterile fashion. The overlying soft tissues were
anesthetized with 1% lidocaine with epinephrine. A 17 gauge core
needle biopsy device was advanced into the inferior aspect of the
exophytic left renal mass. Under direct ultrasound guidance, 5 core
needle biopsies were obtained with the use of an 18 gauge core
needle biopsy device. Multiple ultrasound images were saved for
procedural documentation purposes.

The coaxial needle was removed following the administration of a
Gel-Foam slurry. Post procedural scanning was negative for
significant post procedural hemorrhage or additional complication. A
dressing was placed. The patient tolerated the procedure well
without immediate post procedural complication.
IMPRESSION: Technically successful ultrasound guided biopsy of left renal
lesion/mass.
